# Patient Record
Sex: Female | Born: 1950
Health system: Southern US, Community
[De-identification: ages and names within clinical notes are randomized; demographics above are authoritative.]

## PROBLEM LIST (undated history)

## (undated) DIAGNOSIS — N135 Crossing vessel and stricture of ureter without hydronephrosis: Secondary | ICD-10-CM

## (undated) DIAGNOSIS — C801 Malignant (primary) neoplasm, unspecified: Secondary | ICD-10-CM

## (undated) DIAGNOSIS — N1330 Unspecified hydronephrosis: Secondary | ICD-10-CM

## (undated) DIAGNOSIS — R269 Unspecified abnormalities of gait and mobility: Secondary | ICD-10-CM

## (undated) DIAGNOSIS — N390 Urinary tract infection, site not specified: Secondary | ICD-10-CM

## (undated) DIAGNOSIS — M21372 Foot drop, left foot: Secondary | ICD-10-CM

## (undated) DIAGNOSIS — I10 Essential (primary) hypertension: Secondary | ICD-10-CM

## (undated) HISTORY — DX: Crossing vessel and stricture of ureter without hydronephrosis: N13.5

## (undated) HISTORY — DX: Urinary tract infection, site not specified: N39.0

## (undated) HISTORY — DX: Essential (primary) hypertension: I10

## (undated) HISTORY — DX: Unspecified hydronephrosis: N13.30

## (undated) HISTORY — DX: Unspecified abnormalities of gait and mobility: R26.9

## (undated) HISTORY — DX: Foot drop, left foot: M21.372

---

## 1983-03-04 HISTORY — PX: OTHER SURGICAL HISTORY: SHX169

## 2009-04-29 ENCOUNTER — Inpatient Hospital Stay: Payer: Self-pay | Admitting: Internal Medicine

## 2010-03-03 HISTORY — PX: OTHER SURGICAL HISTORY: SHX169

## 2011-01-09 ENCOUNTER — Ambulatory Visit: Payer: Self-pay | Admitting: Podiatry

## 2015-04-10 DIAGNOSIS — W57XXXA Bitten or stung by nonvenomous insect and other nonvenomous arthropods, initial encounter: Secondary | ICD-10-CM | POA: Diagnosis not present

## 2015-04-10 DIAGNOSIS — S30861A Insect bite (nonvenomous) of abdominal wall, initial encounter: Secondary | ICD-10-CM | POA: Diagnosis not present

## 2015-06-13 ENCOUNTER — Encounter: Payer: Self-pay | Admitting: *Deleted

## 2015-06-13 ENCOUNTER — Emergency Department
Admission: EM | Admit: 2015-06-13 | Discharge: 2015-06-13 | Disposition: A | Discharge status: Home / Self Care | Payer: PPO | Source: Home / Self Care | Attending: Emergency Medicine | Admitting: Emergency Medicine

## 2015-06-13 ENCOUNTER — Encounter: Payer: Self-pay | Admitting: Emergency Medicine

## 2015-06-13 ENCOUNTER — Emergency Department: Payer: PPO

## 2015-06-13 ENCOUNTER — Emergency Department
Admission: EM | Admit: 2015-06-13 | Discharge: 2015-06-13 | Disposition: A | Discharge status: Home / Self Care | Payer: PPO | Attending: Emergency Medicine | Admitting: Emergency Medicine

## 2015-06-13 DIAGNOSIS — I10 Essential (primary) hypertension: Secondary | ICD-10-CM | POA: Insufficient documentation

## 2015-06-13 DIAGNOSIS — N390 Urinary tract infection, site not specified: Secondary | ICD-10-CM

## 2015-06-13 DIAGNOSIS — Z87891 Personal history of nicotine dependence: Secondary | ICD-10-CM

## 2015-06-13 DIAGNOSIS — R300 Dysuria: Secondary | ICD-10-CM | POA: Diagnosis not present

## 2015-06-13 DIAGNOSIS — N1330 Unspecified hydronephrosis: Secondary | ICD-10-CM | POA: Diagnosis not present

## 2015-06-13 DIAGNOSIS — R319 Hematuria, unspecified: Secondary | ICD-10-CM | POA: Diagnosis not present

## 2015-06-13 DIAGNOSIS — R531 Weakness: Secondary | ICD-10-CM | POA: Diagnosis not present

## 2015-06-13 HISTORY — DX: Essential (primary) hypertension: I10

## 2015-06-13 LAB — URINALYSIS COMPLETE WITH MICROSCOPIC (ARMC ONLY)
Bilirubin Urine: NEGATIVE
GLUCOSE, UA: NEGATIVE mg/dL
NITRITE: POSITIVE — AB
Protein, ur: 30 mg/dL — AB
Specific Gravity, Urine: 1.021 (ref 1.005–1.030)
pH: 5 (ref 5.0–8.0)

## 2015-06-13 LAB — CBC
HCT: 39.6 % (ref 35.0–47.0)
Hemoglobin: 13.4 g/dL (ref 12.0–16.0)
MCH: 31.2 pg (ref 26.0–34.0)
MCHC: 33.9 g/dL (ref 32.0–36.0)
MCV: 92 fL (ref 80.0–100.0)
PLATELETS: 138 10*3/uL — AB (ref 150–440)
RBC: 4.3 MIL/uL (ref 3.80–5.20)
RDW: 14 % (ref 11.5–14.5)
WBC: 13.8 10*3/uL — AB (ref 3.6–11.0)

## 2015-06-13 LAB — COMPREHENSIVE METABOLIC PANEL
ALT: 27 U/L (ref 14–54)
AST: 27 U/L (ref 15–41)
Albumin: 4.2 g/dL (ref 3.5–5.0)
Alkaline Phosphatase: 78 U/L (ref 38–126)
Anion gap: 13 (ref 5–15)
BUN: 32 mg/dL — ABNORMAL HIGH (ref 6–20)
CHLORIDE: 98 mmol/L — AB (ref 101–111)
CO2: 23 mmol/L (ref 22–32)
CREATININE: 1.21 mg/dL — AB (ref 0.44–1.00)
Calcium: 9.3 mg/dL (ref 8.9–10.3)
GFR, EST AFRICAN AMERICAN: 52 mL/min — AB (ref >60)
GFR, EST NON AFRICAN AMERICAN: 45 mL/min — AB (ref >60)
Glucose, Bld: 154 mg/dL — ABNORMAL HIGH (ref 65–99)
POTASSIUM: 3.2 mmol/L — AB (ref 3.5–5.1)
SODIUM: 134 mmol/L — AB (ref 135–145)
Total Bilirubin: 1 mg/dL (ref 0.3–1.2)
Total Protein: 7.4 g/dL (ref 6.5–8.1)

## 2015-06-13 MED ORDER — CIPROFLOXACIN HCL 500 MG PO TABS: 500.0000 mg | ORAL_TABLET | Freq: Two times a day (BID) | ORAL | Status: DC

## 2015-06-13 MED ORDER — NITROFURANTOIN MACROCRYSTAL 100 MG PO CAPS: 100.0000 mg | ORAL_CAPSULE | Freq: Four times a day (QID) | ORAL | Status: DC

## 2015-06-13 MED ORDER — CIPROFLOXACIN HCL 500 MG PO TABS
500.0000 mg | ORAL_TABLET | Freq: Once | ORAL | Status: AC
  Administered 2015-06-13: 500 mg via ORAL
  Filled 2015-06-13: qty 1

## 2015-06-13 MED ORDER — PHENAZOPYRIDINE HCL 100 MG PO TABS: 100.0000 mg | ORAL_TABLET | Freq: Three times a day (TID) | ORAL | Status: DC | PRN

## 2015-06-13 MED ORDER — SODIUM CHLORIDE 0.9 % IV BOLUS (SEPSIS)
500.0000 mL | Freq: Once | INTRAVENOUS | Status: AC
  Administered 2015-06-13: 500 mL via INTRAVENOUS

## 2015-06-13 NOTE — ED Provider Notes (Signed)
[facility] Emergency Department Provider Note  ____________________________________________  Time seen:3:30AM  I have reviewed the triage vital signs and the nursing notes.   HISTORY  Chief Complaint Difficulty Walking and Urinary Frequency      HPI Abigail Hill is a 65 y.o. female presents with dysuria acute onset at 75 PM tonight. Patient husband states that he has noticed that his wife has had difficulty with ambulation.   Past Medical History  Diagnosis Date  . Hypertension     There are no active problems to display for this patient.   History reviewed. No pertinent past surgical history.  No current outpatient prescriptions on file.  Allergies Review of patient's allergies indicates no known allergies.  History reviewed. No pertinent family history.  Social History Social History  Substance Use Topics  . Smoking status: Former Research scientist (life sciences)  . Smokeless tobacco: None  . Alcohol Use: Yes    Review of Systems  Constitutional: Negative for fever. Eyes: Negative for visual changes. ENT: Negative for sore throat. Cardiovascular: Negative for chest pain. Respiratory: Negative for shortness of breath. Gastrointestinal: Negative for abdominal pain, vomiting and diarrhea. Genitourinary: Positive dysuria, frequency and urgency. Musculoskeletal: Negative for back pain. Skin: Negative for rash. Neurological: Negative for headaches, focal weakness or numbness.   10-point ROS otherwise negative.  ____________________________________________   PHYSICAL EXAM:  VITAL SIGNS: ED Triage Vitals  Enc Vitals Group     BP 06/13/15 0149 112/55 mmHg     Pulse Rate 06/13/15 0149 92     Resp 06/13/15 0149 18     Temp 06/13/15 0149 99 F (37.2 C)     Temp Source 06/13/15 0149 Oral     SpO2 06/13/15 0149 97 %     Weight 06/13/15 0149 155 lb (70.308 kg)     Height 06/13/15 0149 5\' 6"  (1.676 m)     Head Cir --      Peak Flow --      Pain Score  06/13/15 0215 0     Pain Loc --      Pain Edu? --      Excl. in [place]? --     Constitutional: Alert and oriented. Well appearing and in no distress. Eyes: Conjunctivae are normal. PERRL. Normal extraocular movements. ENT   Head: Normocephalic and atraumatic.   Nose: No congestion/rhinnorhea.   Mouth/Throat: Mucous membranes are moist.   Neck: No stridor. Hematological/Lymphatic/Immunilogical: No cervical lymphadenopathy. Cardiovascular: Normal rate, regular rhythm. Normal and symmetric distal pulses are present in all extremities. No murmurs, rubs, or gallops. Respiratory: Normal respiratory effort without tachypnea nor retractions. Breath sounds are clear and equal bilaterally. No wheezes/rales/rhonchi. Gastrointestinal: Soft and nontender. No distention. There is no CVA tenderness. Genitourinary: deferred Musculoskeletal: Nontender with normal range of motion in all extremities. No joint effusions.  No lower extremity tenderness nor edema. Neurologic:  Normal speech and language. No gross focal neurologic deficits are appreciated. Speech is normal.  Skin:  Skin is warm, dry and intact. No rash noted. Psychiatric: Mood and affect are normal. Speech and behavior are normal. Patient exhibits appropriate insight and judgment.  ____________________________________________    LABS (pertinent positives/negatives)  Labs Reviewed  URINALYSIS COMPLETEWITH MICROSCOPIC ([facility] ONLY) - Abnormal; Notable for the following:    Color, Urine YELLOW (*)    APPearance CLOUDY (*)    Ketones, ur TRACE (*)    Hgb urine dipstick 1+ (*)    Protein, ur 30 (*)    Nitrite POSITIVE (*)  Leukocytes, UA 3+ (*)    Bacteria, UA MANY (*)    Squamous Epithelial / LPF 6-30 (*)    All other components within normal limits  URINE CULTURE       INITIAL IMPRESSION / ASSESSMENT AND PLAN / ED COURSE  Pertinent labs & imaging results that were available during my care of the patient were reviewed  by me and considered in my medical decision making (see chart for details).  Cipro 500 mg po given Patient will be prescribed the same and Pyridium for home.  ____________________________________________   FINAL CLINICAL IMPRESSION(S) / ED DIAGNOSES  Final diagnoses:  UTI (lower urinary tract infection)      Gregor Hams, MD 06/13/15 [phone]

## 2015-06-13 NOTE — ED Notes (Signed)
Pt unable to urinate at this time, given specimen cup for when is able to void.  

## 2015-06-13 NOTE — Discharge Instructions (Signed)

## 2015-06-13 NOTE — ED Provider Notes (Signed)
Time Seen: Approximately 1720  I have reviewed the triage notes  Chief Complaint: Urinary Frequency   History of Present Illness: Abigail Hill is a 65 y.o. female who is here the previous evening and was diagnosed with a urinary tract infection. Patient has urine culture results which are still pending. The patient was discharged with a prescription for Cipro. She states since being home that she's continued to have dysuria, nausea, vomited 1. She went to a follow-up appointment at the local clinic and was referred here for further emergency Department evaluation. She still describes some generalized weakness in both lower extremities. She denies any upper extremity weakness. She denies any headaches. Fever at home. She denies any trouble with speech or swallowing. The patient states that she received a shot of antibiotics at the clinic which one investigation turns out to be Rocephin. She states her lower extremity weakness has improved at this point. She is able to ambulate, etc. She describes some shaking on the way to the clinic for appointment. She did have a low-grade fever last night. She denies any chest or abdominal pain. Past Medical History  Diagnosis Date  . Hypertension     There are no active problems to display for this patient.   History reviewed. No pertinent past surgical history.  History reviewed. No pertinent past surgical history.  Current Outpatient Rx  Name  Route  Sig  Dispense  Refill  . ciprofloxacin (CIPRO) 500 MG tablet   Oral   Take 1 tablet (500 mg total) by mouth 2 (two) times daily.   14 tablet   0   . phenazopyridine (PYRIDIUM) 100 MG tablet   Oral   Take 1 tablet (100 mg total) by mouth 3 (three) times daily as needed for pain.   20 tablet   0     Allergies:  Review of patient's allergies indicates no known allergies.  Family History: History reviewed. No pertinent family history.  Social History: Social History  Substance Use Topics   . Smoking status: Former Research scientist (life sciences)  . Smokeless tobacco: None  . Alcohol Use: Yes     Review of Systems:   10 point review of systems was performed and was otherwise negative:  Constitutional: No fever Eyes: No visual disturbances ENT: No sore throat, ear pain Cardiac: No chest pain Respiratory: No shortness of breath, wheezing, or stridor Abdomen: No abdominal pain, no vomiting, No diarrhea Endocrine: No weight loss, No night sweats Extremities: No peripheral edema, cyanosis Skin: No rashes, easy bruising Neurologic: No focal weakness, trouble with speech or swollowing Urologic: Burning with urination, no Hematuria, or urinary frequency   Physical Exam:  ED Triage Vitals  Enc Vitals Group     BP 06/13/15 1650 145/72 mmHg     Pulse Rate 06/13/15 1650 102     Resp 06/13/15 1650 18     Temp 06/13/15 1650 98.6 F (37 C)     Temp Source 06/13/15 1650 Oral     SpO2 06/13/15 1650 92 %     Weight 06/13/15 1650 155 lb (70.308 kg)     Height 06/13/15 1650 5\' 6"  (1.676 m)     Head Cir --      Peak Flow --      Pain Score 06/13/15 1651 0     Pain Loc --      Pain Edu? --      Excl. in [place]? --     General: Awake , Alert , and Oriented  times 3; GCS 15 Head: Normal cephalic , atraumatic Eyes: Pupils equal , round, reactive to light Nose/Throat: No nasal drainage, patent upper airway without erythema or exudate.  Neck: Supple, Full range of motion, No anterior adenopathy or palpable thyroid masses Lungs: Clear to ascultation without wheezes , rhonchi, or rales Heart: Regular rate, regular rhythm without murmurs , gallops , or rubs Abdomen: Soft, non tender without rebound, guarding , or rigidity; bowel sounds positive and symmetric in all 4 quadrants. No organomegaly .        Extremities: 2 plus symmetric pulses. No edema, clubbing or cyanosis Neurologic: normal ambulation, Motor symmetric without deficits, sensory intact Skin: warm, dry, no rashes   Labs:   All laboratory  work was reviewed including any pertinent negatives or positives listed below:  Labs Reviewed  CBC - Abnormal; Notable for the following:    WBC 13.8 (*)    Platelets 138 (*)    All other components within normal limits  COMPREHENSIVE METABOLIC PANEL  Patient's white blood cell count is elevated but otherwise no abnormalities, renal function is normal   Radiology:  Narrative:    CLINICAL DATA: Difficulty walking, urinary frequency, difficulty moving LEFT leg, hematuria, hypertension, former smoker  EXAM: CT ABDOMEN AND PELVIS WITHOUT CONTRAST  TECHNIQUE: Multidetector CT imaging of the abdomen and pelvis was performed following the standard protocol without IV contrast. Sagittal and coronal MPR images reconstructed from axial data set. Oral contrast was not administered.  COMPARISON: None  FINDINGS: Lung bases appear hyperinflated but clear.  Marked LEFT hydronephrosis without ureteral dilatation or calcification consistent with UPJ obstruction.  Within limits of a nonenhanced exam, no additional focal abnormalities of the liver, spleen, pancreas, kidneys, or adrenal glands.  Normal appendix lateral the cecum.  Scattered colonic diverticulosis greatest at sigmoid colon without evidence of diverticulitis.  Stomach and bowel loops otherwise normal appearance.  Bladder, ureters, uterus and adnexa unremarkable.  No mass, adenopathy, free air or free fluid.  Diffuse osseous demineralization with grade 1 spondylolisthesis L5-S1 secondary to BILATERAL spondylolysis L5.  IMPRESSION: Marked LEFT hydronephrosis without ureteral dilatation or urinary tract calcification compatible with UPJ obstruction ; this could be due to stricture, tumor, radiolucent stone or edema due to prior stone passage.  Colonic diverticulosis without evidence of diverticulitis.          I personally reviewed the radiologic studies   P ED Course:  Given the patient's current  clinical presentation and objective findings appears that she does have exclusively a urinary tract infection with some possible left-sided hydronephrosis. There is no obvious evidence of renal colic at this time. Her hematuria is most likely the infectious source. Patient had received IM Rocephin prior to arrival and was the appropriate antibiotic of selection. Her urine culture has not returned and it sounds as though she may have had difficulty with the ciprofloxacin as she had some nausea afterwards. A current nausea or vomiting. Patient's temperature seemed to resolve and I felt the shaking may have been some mild rigors. Patient will be discharged on Macrodantin and was advised that her urine culture is still pending. I felt was unlikely that she was septic at this time and she is otherwise hemodynamically stable and wishes to be discharged home.    Assessment: * Urinary tract infection versus pyelonephritis      Plan: * Outpatient Patient was advised to return immediately if condition worsens. Patient was advised to follow up with their primary care physician or other specialized physicians involved  in their outpatient care. The patient and/or family member/power of attorney had laboratory results reviewed at the bedside. All questions and concerns were addressed and appropriate discharge instructions were distributed by the nursing staff.             Daymon Larsen, MD 06/13/15 [phone]

## 2015-06-13 NOTE — ED Notes (Signed)
Discharge instructions reviewed with patient. Patient verbalized understanding. Patient ambulated to lobby without difficulty with minimal assistance from her husband.

## 2015-06-13 NOTE — ED Notes (Signed)
Pt was seen in Er last night and diagnosed with a UTI and given cipro, states she was at her PCP office today given a abx shot and sent to the ER, family is unable to state why they were sent to the Er except she was unsteady on her feet, pt awake and alert in no acute distress

## 2015-06-13 NOTE — ED Notes (Signed)
Pt arrived to the ED accompanied by her husband for difficulty walking and urinary frequency. Pt reports that she just came back from vacation and she began to experience difficulty moving her left leg after being walking for a long time; no other neurological symptoms reported. Pt also reports urinary frequency. Pt is AOx4 in no apparent distress.

## 2015-06-15 LAB — URINE CULTURE

## 2015-06-25 DIAGNOSIS — E876 Hypokalemia: Secondary | ICD-10-CM | POA: Diagnosis not present

## 2015-06-25 DIAGNOSIS — N3000 Acute cystitis without hematuria: Secondary | ICD-10-CM | POA: Diagnosis not present

## 2015-06-25 DIAGNOSIS — N1330 Unspecified hydronephrosis: Secondary | ICD-10-CM | POA: Diagnosis not present

## 2015-06-25 DIAGNOSIS — Z862 Personal history of diseases of the blood and blood-forming organs and certain disorders involving the immune mechanism: Secondary | ICD-10-CM | POA: Diagnosis not present

## 2015-07-10 ENCOUNTER — Ambulatory Visit (INDEPENDENT_AMBULATORY_CARE_PROVIDER_SITE_OTHER): Payer: PPO | Admitting: Urology

## 2015-07-10 ENCOUNTER — Encounter: Payer: Self-pay | Admitting: Urology

## 2015-07-10 VITALS — BP 158/73 | HR 83 | Ht 64.0 in | Wt 156.2 lb

## 2015-07-10 DIAGNOSIS — N390 Urinary tract infection, site not specified: Secondary | ICD-10-CM

## 2015-07-10 DIAGNOSIS — N1330 Unspecified hydronephrosis: Secondary | ICD-10-CM | POA: Diagnosis not present

## 2015-07-10 LAB — URINALYSIS, COMPLETE
Bilirubin, UA: NEGATIVE
Glucose, UA: NEGATIVE
Ketones, UA: NEGATIVE
Leukocytes, UA: NEGATIVE
Nitrite, UA: NEGATIVE
PROTEIN UA: NEGATIVE
RBC, UA: NEGATIVE
Specific Gravity, UA: 1.015 (ref 1.005–1.030)
UUROB: 0.2 mg/dL (ref 0.2–1.0)
pH, UA: 6.5 (ref 5.0–7.5)

## 2015-07-10 LAB — MICROSCOPIC EXAMINATION: Epithelial Cells (non renal): NONE SEEN /hpf (ref 0–10)

## 2015-07-10 NOTE — Progress Notes (Signed)
07/10/2015 2:06 PM   [name] Apr 30, 1950 [id number]  Referring provider: Madelyn Brunner, MD [place] [facility] [place], [place] [address]  Chief Complaint  Patient presents with  . Recurrent UTI    referred by Dr. Lisette Grinder  . Hydronephrosis    left kidney not draining    HPI: Patient is a 65 year old Caucasian female who is referred by her primary care physician, Dr. Lisette Grinder, after undergoing a CT Renal stone scan during a trip to the emergency room and was found to have marketed left hydronephrosis.  Patient was initially seen at [facility] emergency department on 06/13/2015 for difficulty walking and urinary frequency.  Her UA at that time was suspicious for infection with TNTC RBC's/hpf and TNTC WBC's/hpf and nitrite positive.  She was given Rocephin and discharged to home with Cipro 500 mg and Pyridium.  She continued to worsen and sought treatment at an urgent care facility. She was then rerouted back to the emergency room.  By this time, she developed nausea and vomiting.  Her ambulation had improved.  A CT renal stone study was then performed and noted marked LEFT hydronephrosis without ureteral dilatation or urinary tract calcification compatible with UPJ obstruction ; this could be due to stricture, tumor, radiolucent stone or edema due to prior stone passage.  She was then changed to [facility] and discharged to home.  Her urine culture was positive for Escherichia coli. It was sensitive to all the antibiotics that she was given.  Today, she states she is feeling well.  She is not having flank pain, dysuria, gross hematuria or suprapubic pain.  She is not having fevers, chills, nausea or vomiting.  Her UA is unremarkable.  Her baseline urinary symptoms consist of urinary frequency and nocturia.  She did undergo a surgical procedure in 1985 with Dr. Redmond Baseman.  At her description, it sounds like she underwent a pyeloplasty.  I did personally review  the CT films with the patient and with Dr. Erlene Quan.  Is not having history of recurrent urinary tract infections or nephrolithiasis.  PMH: Past Medical History  Diagnosis Date  . Hypertension   . Recurrent UTI     Surgical History: Past Surgical History  Procedure Laterality Date  . Broken foot  2012  . Kidney repair  1985    repaired torn blood vessel in kidney    Home Medications:    Medication List       This list is accurate as of: 07/10/15  2:06 PM.  Always use your most recent med list.               CENTRUM SILVER PO  Take by mouth.     ciprofloxacin 500 MG tablet  Commonly known as:  CIPRO  Reported on 07/10/2015     doxycycline 100 MG tablet  Commonly known as:  VIBRA-TABS  Reported on 07/10/2015     hydrochlorothiazide 12.5 MG tablet  Commonly known as:  HYDRODIURIL  Take by mouth.     levofloxacin 500 MG tablet  Commonly known as:  LEVAQUIN  Reported on 07/10/2015     mupirocin ointment 2 %  Commonly known as:  BACTROBAN  Reported on 07/10/2015     nitrofurantoin 100 MG capsule  Commonly known as:  MACRODANTIN  Take 1 capsule (100 mg total) by mouth 4 (four) times daily.     phenazopyridine 100 MG tablet  Commonly known as:  PYRIDIUM  Take 1 tablet (  100 mg total) by mouth 3 (three) times daily as needed for pain.        Allergies: No Known Allergies  Family History: Family History  Problem Relation Age of Onset  . Kidney disease Neg Hx   . Bladder Cancer Neg Hx     Social History:  reports that she has quit smoking. She does not have any smokeless tobacco history on file. She reports that she drinks alcohol. She reports that she does not use illicit drugs.  ROS: UROLOGY Frequent Urination?: Yes Hard to postpone urination?: No Burning/pain with urination?: No Get up at night to urinate?: Yes Leakage of urine?: No Urine stream starts and stops?: No Trouble starting stream?: No Do you have to strain to urinate?: No Blood in urine?:  No Urinary tract infection?: Yes Sexually transmitted disease?: No Injury to kidneys or bladder?: No Painful intercourse?: No Weak stream?: No Currently pregnant?: No Vaginal bleeding?: No Last menstrual period?: n  Gastrointestinal Nausea?: No Vomiting?: No Indigestion/heartburn?: No Diarrhea?: No Constipation?: No  Constitutional Fever: No Night sweats?: No Weight loss?: No Fatigue?: No  Skin Skin rash/lesions?: No Itching?: No  Eyes Blurred vision?: No Double vision?: No  Ears/Nose/Throat Sore throat?: No Sinus problems?: No  Hematologic/Lymphatic Swollen glands?: No Easy bruising?: No  Cardiovascular Leg swelling?: No Chest pain?: No  Respiratory Cough?: No Shortness of breath?: No  Endocrine Excessive thirst?: No  Musculoskeletal Back pain?: No Joint pain?: No  Neurological Headaches?: No Dizziness?: No  Psychologic Depression?: No Anxiety?: No  Physical Exam: BP 158/73 mmHg  Pulse 83  Ht 5\' 4"  (1.626 m)  Wt 156 lb 3.2 oz (70.852 kg)  BMI 26.80 kg/m2  Constitutional: Well nourished. Alert and oriented, No acute distress. HEENT: [place] AT, moist mucus membranes. Trachea midline, no masses. Cardiovascular: No clubbing, cyanosis, or edema. Respiratory: Normal respiratory effort, no increased work of breathing. GI: Abdomen is soft, non tender, non distended, no abdominal masses. Liver and spleen not palpable.  No hernias appreciated.  Stool sample for occult testing is not indicated.   GU: No CVA tenderness.  No bladder fullness or masses.   Skin: No rashes, bruises or suspicious lesions. Lymph: No cervical or inguinal adenopathy. Neurologic: Grossly intact, no focal deficits, moving all 4 extremities. Psychiatric: Normal mood and affect.  Laboratory Data: Lab Results  Component Value Date   WBC 13.8* 06/13/2015   HGB 13.4 06/13/2015   HCT 39.6 06/13/2015   MCV 92.0 06/13/2015   PLT 138* 06/13/2015   Serum creatinine  0.9    07/13/2013  0.8   08/01/2014  0.8   01/31/2015  Lab Results  Component Value Date   CREATININE 1.21* 06/13/2015  Serum creatinine  1.0   06/25/2015   Lab Results  Component Value Date   AST 27 06/13/2015   Lab Results  Component Value Date   ALT 27 06/13/2015    Urinalysis Results for orders placed or performed in visit on 07/10/15  CULTURE, URINE COMPREHENSIVE  Result Value Ref Range   Urine Culture, Comprehensive Final report    Result 1 Comment   Microscopic Examination  Result Value Ref Range   WBC, UA 0-5 0 -  5 /hpf   RBC, UA 0-2 0 -  2 /hpf   Epithelial Cells (non renal) None seen 0 - 10 /hpf   Bacteria, UA Few (A) None seen/Few  Urinalysis, Complete  Result Value Ref Range   Specific Gravity, UA 1.015 1.005 - 1.030   pH, UA  6.5 5.0 - 7.5   Color, UA Yellow Yellow   Appearance Ur Clear Clear   Leukocytes, UA Negative Negative   Protein, UA Negative Negative/Trace   Glucose, UA Negative Negative   Ketones, UA Negative Negative   RBC, UA Negative Negative   Bilirubin, UA Negative Negative   Urobilinogen, Ur 0.2 0.2 - 1.0 mg/dL   Nitrite, UA Negative Negative   Microscopic Examination See below:   BUN+Creat  Result Value Ref Range   BUN 17 8 - 27 mg/dL   Creatinine, Ser 0.99 0.57 - 1.00 mg/dL   GFR calc non Af Amer 59 (L) >59 mL/min/1.73   GFR calc Af Amer 68 >59 mL/min/1.73   BUN/Creatinine Ratio 17 12 - 28    Pertinent Imaging: CLINICAL DATA: Difficulty walking, urinary frequency, difficulty moving LEFT leg, hematuria, hypertension, former smoker  EXAM: CT ABDOMEN AND PELVIS WITHOUT CONTRAST  TECHNIQUE: Multidetector CT imaging of the abdomen and pelvis was performed following the standard protocol without IV contrast. Sagittal and coronal MPR images reconstructed from axial data set. Oral contrast was not administered.  COMPARISON: None  FINDINGS: Lung bases appear hyperinflated but clear.  Marked LEFT hydronephrosis without  ureteral dilatation or calcification consistent with UPJ obstruction.  Within limits of a nonenhanced exam, no additional focal abnormalities of the liver, spleen, pancreas, kidneys, or adrenal glands.  Normal appendix lateral the cecum.  Scattered colonic diverticulosis greatest at sigmoid colon without evidence of diverticulitis.  Stomach and bowel loops otherwise normal appearance.  Bladder, ureters, uterus and adnexa unremarkable.  No mass, adenopathy, free air or free fluid.  Diffuse osseous demineralization with grade 1 spondylolisthesis L5-S1 secondary to BILATERAL spondylolysis L5.  IMPRESSION: Marked LEFT hydronephrosis without ureteral dilatation or urinary tract calcification compatible with UPJ obstruction ; this could be due to stricture, tumor, radiolucent stone or edema due to prior stone passage.  Colonic diverticulosis without evidence of diverticulitis.   Electronically Signed  By: Lavonia Dana M.D.  On: 06/13/2015 18:26   Assessment & Plan:    1. Left hydronephrosis:   Patient found to have left hydronephrosis compatible with a UPJ obstruction on CT renal stone study completed on 06/13/2015.  She does have a prior history of kidney surgery, most likely a pyeloplasty.  We'll obtain a renal Lasix scan at this time to establish a baseline of kidney function.  She will return to discuss the results.  - BUN+Creat  2. UTI:   Patient had a recent infection with Escherichia coli.  Symptoms have now resolved.  UA is unremarkable.  We will culture the urine to ensure clearance of the infection.  Patient will contact our office if she should develop symptoms of urinary tract infection so that we may do a UA and send the urine for culture if appropriate.  - Urinalysis, Complete - CULTURE, URINE COMPREHENSIVE  Return for Renal lasix scan report with me or Dr. Erlene Quan.  These notes generated with voice recognition software. I apologize for  typographical errors.  Zara Council, [facility] [address] [place], [place] [address] [phone]

## 2015-07-11 LAB — BUN+CREAT
BUN/Creatinine Ratio: 17 (ref 12–28)
BUN: 17 mg/dL (ref 8–27)
CREATININE: 0.99 mg/dL (ref 0.57–1.00)
GFR, EST AFRICAN AMERICAN: 68 mL/min/{1.73_m2} (ref >59)
GFR, EST NON AFRICAN AMERICAN: 59 mL/min/{1.73_m2} — AB (ref >59)

## 2015-07-12 LAB — CULTURE, URINE COMPREHENSIVE

## 2015-07-14 DIAGNOSIS — N390 Urinary tract infection, site not specified: Secondary | ICD-10-CM

## 2015-07-14 DIAGNOSIS — N1330 Unspecified hydronephrosis: Secondary | ICD-10-CM

## 2015-07-14 HISTORY — DX: Urinary tract infection, site not specified: N39.0

## 2015-07-14 HISTORY — DX: Unspecified hydronephrosis: N13.30

## 2015-07-16 ENCOUNTER — Telehealth: Payer: Self-pay | Admitting: Urology

## 2015-07-16 NOTE — Telephone Encounter (Signed)
The nuclear medicine radiology department called and asked if you would please change the order for this patient to "WITH PHARM".  They typically do not do "WITH AND WITHOUT PHARM".  Please change the order so they can schedule.

## 2015-07-16 NOTE — Telephone Encounter (Signed)
Would they double check?  I wanted the same study "renal lasix scan" on another patient and that is what they told me to order.

## 2015-07-17 NOTE — Telephone Encounter (Signed)
Spoke with Jody, at nuclear med, who stated that the order in the system is the correct order.

## 2015-07-23 ENCOUNTER — Ambulatory Visit
Admission: RE | Admit: 2015-07-23 | Discharge: 2015-07-23 | Disposition: A | Discharge status: Home / Self Care | Payer: PPO | Source: Ambulatory Visit | Attending: Urology | Admitting: Urology

## 2015-07-23 DIAGNOSIS — N1330 Unspecified hydronephrosis: Secondary | ICD-10-CM | POA: Diagnosis not present

## 2015-07-23 MED ORDER — FUROSEMIDE 10 MG/ML IJ SOLN
20.0000 mg | Freq: Once | INTRAMUSCULAR | Status: AC
  Administered 2015-07-23: 20 mg via INTRAVENOUS
  Filled 2015-07-23: qty 2

## 2015-07-23 MED ORDER — TECHNETIUM TC 99M MERTIATIDE
5.0000 | Freq: Once | INTRAVENOUS | Status: AC | PRN
  Administered 2015-07-23: 5.42 via INTRAVENOUS

## 2015-07-24 ENCOUNTER — Ambulatory Visit (INDEPENDENT_AMBULATORY_CARE_PROVIDER_SITE_OTHER): Payer: PPO | Admitting: Urology

## 2015-07-24 ENCOUNTER — Encounter: Payer: Self-pay | Admitting: Urology

## 2015-07-24 VITALS — BP 134/74 | HR 82 | Ht 64.0 in | Wt 156.3 lb

## 2015-07-24 DIAGNOSIS — N1330 Unspecified hydronephrosis: Secondary | ICD-10-CM | POA: Diagnosis not present

## 2015-07-24 DIAGNOSIS — N135 Crossing vessel and stricture of ureter without hydronephrosis: Secondary | ICD-10-CM

## 2015-07-24 NOTE — Progress Notes (Signed)
6:50 PM   LEWIS GLERUM 10/09/1950 [id number]  Referring provider: Madelyn Brunner, MD [facility] [place], [place] [address]  Chief Complaint  Patient presents with  . Results    Renal Lasix Report    HPI: Patient is a 65 year old Caucasian female who presents today to discuss her renal Lasix report.  Background history Patient was referred by her primary care physician, Dr. Lisette Grinder, after undergoing a CT Renal stone scan during a trip to the emergency room and was found to have marketed left hydronephrosis.   Patient was initially seen at [facility] emergency department on 06/13/2015 for difficulty walking and urinary frequency.  Her UA at that time was suspicious for infection with TNTC RBC's/hpf and TNTC WBC's/hpf and nitrite positive.  She was given Rocephin and discharged to home with Cipro 500 mg and Pyridium.  She continued to worsen and sought treatment at an urgent care facility. She was then rerouted back to the emergency room.  By this time, she developed nausea and vomiting.  Her ambulation had improved.  A CT renal stone study was then performed and noted marked LEFT hydronephrosis without ureteral dilatation or urinary tract calcification compatible with UPJ obstruction ; this could be due to stricture, tumor, radiolucent stone or edema due to prior stone passage.  She was then changed to [facility] and discharged to home.  Her urine culture was positive for Escherichia coli. It was sensitive to all the antibiotics that she was given.    Today, she states she is feeling well.  She is not having flank pain, dysuria, gross hematuria or suprapubic pain.  She is not having fevers, chills, nausea or vomiting.  Her baseline urinary symptoms consist of urinary frequency, incontinence and nocturia.  She did undergo a surgical procedure in 1985 with Dr. Redmond Baseman.  With her description, it sounds like she underwent a pyeloplasty.  She does not have a history of  recurrent urinary tract infections or nephrolithiasis.  Renal Lasix scan completed on 07/23/2015 noted normally functioning right kidney.  Delayed perfusion, cortical uptake and excretion of the  radiopharmaceutical by the hydronephrotic left kidney compatible with UPJ obstruction.  Split renal function is equal to 62.5% in the right kidney and 37.5% from the left kidney.  I personally reviewed the films with the patient and with Dr. Erlene Quan.   PMH: Past Medical History  Diagnosis Date  . Hypertension   . Recurrent UTI     Surgical History: Past Surgical History  Procedure Laterality Date  . Broken foot  2012  . Kidney repair  1985    repaired torn blood vessel in kidney    Home Medications:    Medication List       This list is accurate as of: 07/24/15 11:59 PM.  Always use your most recent med list.               CENTRUM SILVER PO  Take by mouth.     ciprofloxacin 500 MG tablet  Commonly known as:  CIPRO  Reported on 07/24/2015     doxycycline 100 MG tablet  Commonly known as:  VIBRA-TABS  Reported on 07/24/2015     hydrochlorothiazide 12.5 MG tablet  Commonly known as:  HYDRODIURIL  Take by mouth.     levofloxacin 500 MG tablet  Commonly known as:  LEVAQUIN  Reported on 07/24/2015     mupirocin ointment 2 %  Commonly known as:  [employer]  Reported on 07/24/2015     nitrofurantoin 100 MG capsule  Commonly known as:  MACRODANTIN  Take 1 capsule (100 mg total) by mouth 4 (four) times daily.     phenazopyridine 100 MG tablet  Commonly known as:  PYRIDIUM  Take 1 tablet (100 mg total) by mouth 3 (three) times daily as needed for pain.        Allergies: No Known Allergies  Family History: Family History  Problem Relation Age of Onset  . Kidney disease Neg Hx   . Bladder Cancer Neg Hx     Social History:  reports that she has quit smoking. She does not have any smokeless tobacco history on file. She reports that she drinks alcohol. She reports that  she does not use illicit drugs.  ROS: UROLOGY Frequent Urination?: Yes Hard to postpone urination?: No Burning/pain with urination?: No Get up at night to urinate?: Yes Leakage of urine?: Yes Urine stream starts and stops?: No Trouble starting stream?: No Do you have to strain to urinate?: No Blood in urine?: No Urinary tract infection?: No Sexually transmitted disease?: No Injury to kidneys or bladder?: No Painful intercourse?: No Weak stream?: No Currently pregnant?: No Vaginal bleeding?: No Last menstrual period?: n  Gastrointestinal Nausea?: No Vomiting?: No Indigestion/heartburn?: No Diarrhea?: No Constipation?: No  Constitutional Fever: No Night sweats?: No Weight loss?: No Fatigue?: No  Skin Skin rash/lesions?: No Itching?: No  Eyes Blurred vision?: No Double vision?: No  Ears/Nose/Throat Sore throat?: No Sinus problems?: No  Hematologic/Lymphatic Swollen glands?: No Easy bruising?: No  Cardiovascular Leg swelling?: No Chest pain?: No  Respiratory Cough?: No Shortness of breath?: No  Endocrine Excessive thirst?: No  Musculoskeletal Back pain?: No Joint pain?: No  Neurological Headaches?: No Dizziness?: No  Psychologic Depression?: No Anxiety?: No  Physical Exam: BP 134/74 mmHg  Pulse 82  Ht 5\' 4"  (1.626 m)  Wt 156 lb 4.8 oz (70.897 kg)  BMI 26.82 kg/m2  Constitutional: Well nourished. Alert and oriented, No acute distress. HEENT:  AT, moist mucus membranes. Trachea midline, no masses. Cardiovascular: No clubbing, cyanosis, or edema. Respiratory: Normal respiratory effort, no increased work of breathing. GI: Abdomen is soft, non tender, non distended, no abdominal masses. Liver and spleen not palpable.  No hernias appreciated.  Stool sample for occult testing is not indicated.   GU: No CVA tenderness.  No bladder fullness or masses.   Skin: No rashes, bruises or suspicious lesions. Lymph: No cervical or inguinal  adenopathy. Neurologic: Grossly intact, no focal deficits, moving all 4 extremities. Psychiatric: Normal mood and affect.  Laboratory Data: Lab Results  Component Value Date   WBC 13.8* 06/13/2015   HGB 13.4 06/13/2015   HCT 39.6 06/13/2015   MCV 92.0 06/13/2015   PLT 138* 06/13/2015   Serum creatinine  0.9   07/13/2013  0.8   08/01/2014  0.8   01/31/2015  Lab Results  Component Value Date   CREATININE 0.99 07/10/2015  Serum creatinine  1.0   06/25/2015   Lab Results  Component Value Date   AST 27 06/13/2015   Lab Results  Component Value Date   ALT 27 06/13/2015      Pertinent Imaging: CLINICAL DATA: Evaluate the left hydronephrosis.  EXAM: NUCLEAR MEDICINE RENAL SCAN WITH DIURETIC ADMINISTRATION  TECHNIQUE: Radionuclide angiographic and sequential renal images were obtained after intravenous injection of radiopharmaceutical. Imaging was continued during slow intravenous injection of Lasix approximately 15 minutes after the start of the examination.  RADIOPHARMACEUTICALS: 5.4  mCi Technetium-27m MAG3 IV  COMPARISON: 06/13/2015  FINDINGS: Flow: There is normal perfusion to the right kidney. Delayed and diminished perfusion to the left kidney noted.  Left renogram: Delayed cortical uptake noted. Delayed excretion into a copious, hydronephrotic left renal collecting system noted. Markedly diminished clearance following Lasix.  Right renogram: Normal cortical uptake, excretion in clearance of the radiopharmaceutical.  Differential:  Left kidney = 37.5 %  Right kidney = 62.5 %  IMPRESSION: 1. Normally functioning right kidney. 2. Delayed perfusion, cortical uptake and excretion of the radiopharmaceutical by the hydronephrotic left kidney compatible with UPJ obstruction. 3. Split renal function is equal to 62.5% in the right kidney and 37.5% from the left kidney.   Electronically Signed  By: Kerby Moors M.D.  On:  07/23/2015 14:45   Assessment & Plan:    1. Left hydronephrosis:   Patient found to have left hydronephrosis compatible with a UPJ obstruction on renal lasix scan completed on 07/23/2015.   She does have a prior history of kidney surgery, most likely a pyeloplasty.  She will return to discuss the results further with Dr. Erlene Quan as she may want to pursue a reconstruction of the UPJ.    2. UPJ obstruction:   See above.    3. UTI:  Resolved.   Return for appointment with Dr. Erlene Quan.  These notes generated with voice recognition software. I apologize for typographical errors.  Zara Council, [facility] [address] [place], [place] [address] [phone]

## 2015-07-28 DIAGNOSIS — N135 Crossing vessel and stricture of ureter without hydronephrosis: Secondary | ICD-10-CM | POA: Insufficient documentation

## 2015-07-28 DIAGNOSIS — N1330 Unspecified hydronephrosis: Secondary | ICD-10-CM | POA: Insufficient documentation

## 2015-07-28 HISTORY — DX: Crossing vessel and stricture of ureter without hydronephrosis: N13.5

## 2015-07-31 DIAGNOSIS — E876 Hypokalemia: Secondary | ICD-10-CM | POA: Diagnosis not present

## 2015-07-31 DIAGNOSIS — I10 Essential (primary) hypertension: Secondary | ICD-10-CM | POA: Diagnosis not present

## 2015-07-31 DIAGNOSIS — N3000 Acute cystitis without hematuria: Secondary | ICD-10-CM | POA: Diagnosis not present

## 2015-07-31 DIAGNOSIS — Z862 Personal history of diseases of the blood and blood-forming organs and certain disorders involving the immune mechanism: Secondary | ICD-10-CM | POA: Diagnosis not present

## 2015-08-24 ENCOUNTER — Ambulatory Visit (INDEPENDENT_AMBULATORY_CARE_PROVIDER_SITE_OTHER): Payer: PPO | Admitting: Urology

## 2015-08-24 VITALS — Ht 63.0 in | Wt 154.0 lb

## 2015-08-24 DIAGNOSIS — R29898 Other symptoms and signs involving the musculoskeletal system: Secondary | ICD-10-CM | POA: Diagnosis not present

## 2015-08-24 DIAGNOSIS — I10 Essential (primary) hypertension: Secondary | ICD-10-CM | POA: Insufficient documentation

## 2015-08-24 DIAGNOSIS — N1330 Unspecified hydronephrosis: Secondary | ICD-10-CM

## 2015-08-24 DIAGNOSIS — N135 Crossing vessel and stricture of ureter without hydronephrosis: Secondary | ICD-10-CM | POA: Diagnosis not present

## 2015-08-24 HISTORY — DX: Essential (primary) hypertension: I10

## 2015-08-24 NOTE — Progress Notes (Signed)
08/24/2015 2:49 PM   Abigail Hill Feb 05, 1951 [id number]  Referring provider: Madelyn Brunner, MD [facility] [place], [place] [address]  Chief Complaint  Patient presents with  . Hydronephrosis    follow up    HPI: 65 year old female who presents today to discuss management of her left UPJ obstruction.  She presented to the emergency room on 06/13/2015 with a severe urinary tract infection, E. coli. As part of further workup, she underwent a CT stone protocol which revealed marked left hydronephrosis compatible with UPJ obstruction without an obstructing stone.  She was seen and evaluated in our office and underwent a Lasix renogram on 07/23/2015 which was consistent with left UPJ obstruction with decreased split function, 37.5% of of the left kidney.  She did have surgery on her left kidney 30 years ago by Dr. Redmond Baseman at which time it sounds like she had a UPJ repair (open pyeloplasty).  She has a flank incision on the left.    Since surgery, she's had no issues with flank pain, hematuria, stones or any other urinary issues. This is her first urinary tract infection in 10 years.  Cr 0.99, normal.  She does report today that over the past year, she has had increasing issues lifting her left thigh. It is fairly weak at times from an unclear etiology. She has no other leg weakness. She denies any foot drop or paresthesia. She has no issues with flexion or extension of her calf. She denies any problems with by abduction or abduction.  At times, it makes it difficult to it up out of her chair.    PMH: Past Medical History  Diagnosis Date  . Hypertension   . Recurrent UTI     Surgical History: Past Surgical History  Procedure Laterality Date  . Broken foot  2012  . Kidney repair  1985    repaired torn blood vessel in kidney    Home Medications:    Medication List       This list is accurate as of: 08/24/15 11:59 PM.  Always use your most  recent med list.               CENTRUM SILVER PO  Take by mouth.     hydrochlorothiazide 12.5 MG tablet  Commonly known as:  HYDRODIURIL  Take by mouth.        Allergies: No Known Allergies  Family History: Family History  Problem Relation Age of Onset  . Kidney disease Neg Hx   . Bladder Cancer Neg Hx     Social History:  reports that she has quit smoking. She does not have any smokeless tobacco history on file. She reports that she drinks alcohol. She reports that she does not use illicit drugs.  ROS: UROLOGY Frequent Urination?: No Hard to postpone urination?: No Burning/pain with urination?: No Get up at night to urinate?: Yes Leakage of urine?: No Urine stream starts and stops?: No Trouble starting stream?: No Do you have to strain to urinate?: No Blood in urine?: No Urinary tract infection?: No Sexually transmitted disease?: No Injury to kidneys or bladder?: No Painful intercourse?: No Weak stream?: No Currently pregnant?: No Vaginal bleeding?: No Last menstrual period?: n  Gastrointestinal Nausea?: No Vomiting?: No Indigestion/heartburn?: No Diarrhea?: No Constipation?: No  Constitutional Fever: No Night sweats?: No Weight loss?: No Fatigue?: No  Skin Skin rash/lesions?: No Itching?: No  Eyes Blurred vision?: No Double vision?: No  Ears/Nose/Throat Sore throat?:  No Sinus problems?: No  Hematologic/Lymphatic Swollen glands?: No Easy bruising?: No  Cardiovascular Leg swelling?: No Chest pain?: No  Respiratory Cough?: No Shortness of breath?: No  Endocrine Excessive thirst?: No  Musculoskeletal Back pain?: No Joint pain?: No  Neurological Headaches?: No Dizziness?: No  Psychologic Depression?: No Anxiety?: No  Physical Exam: Ht 5\' 3"  (1.6 m)  Wt 154 lb (69.854 kg)  BMI 27.29 kg/m2  Constitutional:  Alert and oriented, No acute distress.  Accompanied by husband today. HEENT: A[place] AT, moist mucus membranes.   Trachea midline, no masses. Cardiovascular: No clubbing, cyanosis, or edema. Respiratory: Normal respiratory effort, no increased work of breathing. GI: Abdomen is soft, nontender, nondistended, no abdominal masses GU: No CVA tenderness. Skin: No rashes, bruises or suspicious lesions. Neurologic: Grossly intact, no focal deficits, moving all 4 extremities.  Weakness with left tight flexion appreciated 3/5, otherwise left leg sensation and strength appears to be intact. Psychiatric: Normal mood and affect.  Laboratory Data: Lab Results  Component Value Date   WBC 13.8* 06/13/2015   HGB 13.4 06/13/2015   HCT 39.6 06/13/2015   MCV 92.0 06/13/2015   PLT 138* 06/13/2015    Lab Results  Component Value Date   CREATININE 0.99 07/10/2015    Pertinent Imaging: Study Result     CLINICAL DATA: Difficulty walking, urinary frequency, difficulty moving LEFT leg, hematuria, hypertension, former smoker  EXAM: CT ABDOMEN AND PELVIS WITHOUT CONTRAST  TECHNIQUE: Multidetector CT imaging of the abdomen and pelvis was performed following the standard protocol without IV contrast. Sagittal and coronal MPR images reconstructed from axial data set. Oral contrast was not administered.  COMPARISON: None  FINDINGS: Lung bases appear hyperinflated but clear.  Marked LEFT hydronephrosis without ureteral dilatation or calcification consistent with UPJ obstruction.  Within limits of a nonenhanced exam, no additional focal abnormalities of the liver, spleen, pancreas, kidneys, or adrenal glands.  Normal appendix lateral the cecum.  Scattered colonic diverticulosis greatest at sigmoid colon without evidence of diverticulitis.  Stomach and bowel loops otherwise normal appearance.  Bladder, ureters, uterus and adnexa unremarkable.  No mass, adenopathy, free air or free fluid.  Diffuse osseous demineralization with grade 1 spondylolisthesis L5-S1 secondary to BILATERAL  spondylolysis L5.  IMPRESSION: Marked LEFT hydronephrosis without ureteral dilatation or urinary tract calcification compatible with UPJ obstruction ; this could be due to stricture, tumor, radiolucent stone or edema due to prior stone passage.  Colonic diverticulosis without evidence of diverticulitis.   Electronically Signed  By: Lavonia Dana M.D.  On: 06/13/2015 18:26    Study Result     CLINICAL DATA: Evaluate the left hydronephrosis.  EXAM: NUCLEAR MEDICINE RENAL SCAN WITH DIURETIC ADMINISTRATION  TECHNIQUE: Radionuclide angiographic and sequential renal images were obtained after intravenous injection of radiopharmaceutical. Imaging was continued during slow intravenous injection of Lasix approximately 15 minutes after the start of the examination.  RADIOPHARMACEUTICALS: 5.4 mCi Technetium-69m MAG3 IV  COMPARISON: 06/13/2015  FINDINGS: Flow: There is normal perfusion to the right kidney. Delayed and diminished perfusion to the left kidney noted.  Left renogram: Delayed cortical uptake noted. Delayed excretion into a copious, hydronephrotic left renal collecting system noted. Markedly diminished clearance following Lasix.  Right renogram: Normal cortical uptake, excretion in clearance of the radiopharmaceutical.  Differential:  Left kidney = 37.5 %  Right kidney = 62.5 %  IMPRESSION: 1. Normally functioning right kidney. 2. Delayed perfusion, cortical uptake and excretion of the radiopharmaceutical by the hydronephrotic left kidney compatible with UPJ obstruction. 3. Split renal function  is equal to 62.5% in the right kidney and 37.5% from the left kidney.   Electronically Signed  By: Kerby Moors M.D.  On: 07/23/2015 14:45    Assessment & Plan:    1. UPJ (ureteropelvic junction) obstruction Chronic UPJ s/p repair 30 years ago with ongoing obstruction resulting in decreased left renal function based on lasix renogram.  She is otherwise completely symptomatic without recurrent UTIs, stones, flank pain (Dietel's crisis), or any other sequela.  Overall renal function preserved with normal functioning right kidney.    We had a lengthy discussion today about how to proceed. I explained that over time, she may continue to lose left renal function but the timeline for this is unclear and it is possible that this kidney has been partially obstructed her whole life and continues to maintain at least some function.  Other than an isolated UTI, she has no other symptomatic sequela.  Options include management with chronic indwelling stent which could be changed every 6 months, observation with serial ultrasounds and BMP, or a repeat repair, likely robotic dismembered pyeloplasty. We discussed that redo pyeloplasties have a success rate around 70%.  After carefully considering her options, she would prefer to continue with observation which is very reasonable. I reiterated that she may continue to lose renal function on the left but she is not particularly interested in surgical management including both minor and major procedures.  2. Hydronephrosis, left As above  3. Left leg weakness Etiology of left leg weakness is unclear but likely unrelated to left UPJ obstruction. Although she has fairly significant hydronephrosis on this side, presentation such as this would be higly unusual. I have recommended further workup with neurology and possibly physical therapy.  I will consult with several other experienced urologists in the community and get back to Mrs. Postlethwaite.   Return in about 6 months (around 02/23/2016) for recheck.  Hollice Espy, MD  [facility] [address] [place], [place] [address] [phone]  I spent 25 min with this patient of which greater than 50% was spent in counseling and coordination of care with the patient.

## 2015-09-09 ENCOUNTER — Encounter: Payer: Self-pay | Admitting: Urology

## 2015-09-17 DIAGNOSIS — M79642 Pain in left hand: Secondary | ICD-10-CM | POA: Diagnosis not present

## 2015-09-17 DIAGNOSIS — M1812 Unilateral primary osteoarthritis of first carpometacarpal joint, left hand: Secondary | ICD-10-CM | POA: Diagnosis not present

## 2015-11-07 DIAGNOSIS — L821 Other seborrheic keratosis: Secondary | ICD-10-CM | POA: Diagnosis not present

## 2015-11-07 DIAGNOSIS — Z85828 Personal history of other malignant neoplasm of skin: Secondary | ICD-10-CM | POA: Diagnosis not present

## 2015-11-07 DIAGNOSIS — L570 Actinic keratosis: Secondary | ICD-10-CM | POA: Diagnosis not present

## 2015-11-07 DIAGNOSIS — D225 Melanocytic nevi of trunk: Secondary | ICD-10-CM | POA: Diagnosis not present

## 2015-11-07 DIAGNOSIS — D2261 Melanocytic nevi of right upper limb, including shoulder: Secondary | ICD-10-CM | POA: Diagnosis not present

## 2015-11-07 DIAGNOSIS — X32XXXA Exposure to sunlight, initial encounter: Secondary | ICD-10-CM | POA: Diagnosis not present

## 2015-12-18 DIAGNOSIS — R269 Unspecified abnormalities of gait and mobility: Secondary | ICD-10-CM | POA: Diagnosis not present

## 2015-12-18 DIAGNOSIS — M21372 Foot drop, left foot: Secondary | ICD-10-CM

## 2015-12-18 HISTORY — DX: Foot drop, left foot: M21.372

## 2015-12-18 HISTORY — DX: Unspecified abnormalities of gait and mobility: R26.9

## 2015-12-28 DIAGNOSIS — M21372 Foot drop, left foot: Secondary | ICD-10-CM | POA: Diagnosis not present

## 2016-01-16 ENCOUNTER — Encounter: Payer: Self-pay | Admitting: Physical Therapy

## 2016-01-16 ENCOUNTER — Ambulatory Visit: Payer: PPO | Attending: Neurology | Admitting: Physical Therapy

## 2016-01-16 DIAGNOSIS — M6281 Muscle weakness (generalized): Secondary | ICD-10-CM | POA: Insufficient documentation

## 2016-01-16 DIAGNOSIS — R262 Difficulty in walking, not elsewhere classified: Secondary | ICD-10-CM | POA: Insufficient documentation

## 2016-01-16 NOTE — Patient Instructions (Addendum)
Balance, Proprioception: Hip Extension With Tubing    With tubing attached to ankle of uninvolved leg, swing leg back. Return. Repeat ____ times or for ____ minutes. Do ____ sessions per day.  http://cc.exer.us/19   Copyright  VHI. All rights reserved.  ABDUCTION: Standing - Resistance Band (Active)    Stand, feet flat. Against yellow resistance band, lift right leg out to side. Complete ___ sets of ___ repetitions. Perform ___ sessions per day.  http://gtsc.exer.us/117   Copyright  VHI. All rights reserved.  Sit to Stand / Stand to Sit / Transfers    Sit on edge of a solid chair with arms, feet flat on floor. Lean forward over feet and stand up with hands on chair arms. Sit down slowly with hands on chair arms. Repeat ____ times per session. Do ____ sessions per day.  Copyright  VHI. All rights reserved.

## 2016-01-16 NOTE — Therapy (Addendum)
[place] MAIN [facility] SERVICES [address] [place], [place], [address] Phone: [phone]   Fax:  [phone]  Physical Therapy Evaluation  Patient Details  Name: Abigail Hill MRN: [id number] Date of Birth: Aug 21, 1950 Referring Provider: Dr. Melrose Nakayama  Encounter Date: 01/16/2016      PT End of Session - 01/16/16 0930    Visit Number 1   Number of Visits 17   Date for PT Re-Evaluation 03-17-16   Authorization Type g codes   Authorization Time Period 1   PT Start Time 0915   PT Stop Time 1015   PT Time Calculation (min) 60 min   Equipment Utilized During Treatment Gait belt   Activity Tolerance Patient tolerated treatment well   Behavior During Therapy [facility] for tasks assessed/performed      Past Medical History:  Diagnosis Date  . Hypertension   . Recurrent UTI     Past Surgical History:  Procedure Laterality Date  . broken foot  2012  . kidney repair  1985   repaired torn blood vessel in kidney    There were no vitals filed for this visit.       Subjective Assessment - 01/16/16 0924    Subjective Patient is having unsteady gait and is now walking wit AFO on left ankle. She had some falls more than 6 months ago and recently needed an AFO. She has unsteady gait.    Patient is accompained by: Family member   Pertinent History surgery on right foot year ago, High blood pressure, uses Left AFO, lives with husband, has steps at front door   Limitations Standing;Walking;House hold activities   How long can you sit comfortably? unlimited   How long can you stand comfortably? 30 mins   How long can you walk comfortably? 15 mintues   Patient Stated Goals walk and be steady   Currently in Pain? No/denies   Multiple Pain Sites No            [facility] PT Assessment - 01/16/16 0001      Assessment   Medical Diagnosis abnormal gait   Referring Provider Dr. Wendie Agreste Dominance Right   Next MD Visit 04/14/16   Prior Therapy NO      Precautions   Precautions None     Restrictions   Weight Bearing Restrictions No     Balance Screen   Has the patient fallen in the past 6 months No   Has the patient had a decrease in activity level because of a fear of falling?  Yes   Is the patient reluctant to leave their home because of a fear of falling?  No     Home Environment   Living Environment Private residence   Living Arrangements Spouse/significant other   Available Help at Discharge Family   Type of [place] to enter   Entrance Stairs-Number of Steps 3   Entrance Stairs-Rails Right   [place] One level   [place] None;Grab bars - tub/shower     Prior Function   Level of Independence Independent with basic ADLs;Independent with gait   Vocation Retired       PAIN: no reports of pain  POSTURE:WNL    PROM/AROM: decreased flexibility of B hip flexors with + Elys bilaterally  STRENGTH:  Graded on a 0-5 scale Muscle Group Left Right  Shoulder flex    Shoulder Abd    Shoulder Ext  Shoulder IR/ER    Elbow    Wrist/hand    Hip Flex 3+/5 4/5  Hip Abd 3+/5 3+/5  Hip Add 2/5 2/5  Hip Ext 2/5 2/5  Hip IR/ER    Knee Flex 5/5 5/5  Knee Ext 5/5 5/5  Ankle DF    Ankle PF -4/5 4/5   SENSATION: WNL  SPECIAL TESTS:+ elys  FUNCTIONAL MOBILITY: independent   BALANCE:berg balance 44/56   GAIT: Ambulates with wide base of support and L AFO with slow gait speed   OUTCOME MEASURES: TEST Outcome Interpretation  5 times sit<>stand 13.41sec >60 yo, >15 sec indicates increased risk for falls  10 meter walk test .64                m/s <1.0 m/s indicates increased risk for falls; limited community ambulator  Timed up and Go      17.64           sec <14 sec indicates increased risk for falls      Berg Balance Assessment 44/56 <36/56 (100% risk for falls), 37-45 (80% risk for falls); 46-51 (>50% risk for falls); 52-55 (lower risk <25% of falls)         Therapeutic  exercise: Standing hip extension and hip abduction with RTB x 15 x 2 Patient was able to perform all exercises with posture correction.                      PT Education - 01/16/16 0929    Education provided Yes   Education Details HEP, Plan of care   Person(s) Educated Patient   Methods Explanation   Comprehension Verbalized understanding             PT Long Term Goals - 01/16/16 1022      PT LONG TERM GOAL #1   Title Patient will be independent in home exercise program to improve strength/mobility for better functional independence with ADLs.   Time 8   Period Weeks   Status New     PT LONG TERM GOAL #2   Title Patient (> 31 years old) will complete five times sit to stand test in < 15 seconds indicating an increased LE strength and improved balance.   Time 8   Period Weeks   Status New     PT LONG TERM GOAL #3   Title Patient will increase Berg Balance score by > 6 points to demonstrate decreased fall risk during functional activities.   Time 8   Period Weeks   Status New     PT LONG TERM GOAL #4   Title Patient will tolerate 5 seconds of single leg stance without loss of balance to improve ability to get in and out of shower safely.   Time 8   Period Weeks   Status New     PT LONG TERM GOAL #5   Title Patient will reduce timed up and go to <11 seconds to reduce fall risk and demonstrate improved transfer/gait ability.   Time 8   Period Weeks   Status New               Plan - 01/16/16 1006    Clinical Impression Statement Patient has unsteady gait, weakness in BLE and decreased stadning balance as indicated wiht 44/56 berg. She has decreased gait speed and has falls risk indicated by outcome measaures.    Rehab Potential Good   Clinical Impairments Affecting Rehab Potential Patient wears  AFO on RLE and has impaired flexibility in BLE hips   PT Frequency 2x / week   PT Duration 8 weeks   PT Treatment/Interventions Therapeutic  exercise;Balance training;Neuromuscular re-education;Therapeutic activities   PT Next Visit Plan dynamic standing balance and strengthening   Consulted and Agree with Plan of Care Patient      Patient will benefit from skilled therapeutic intervention in order to improve the following deficits and impairments:  Abnormal gait, Decreased balance, Decreased mobility, Difficulty walking, Impaired flexibility, Decreased strength  Visit Diagnosis: Difficulty in walking, not elsewhere classified - Plan: PT plan of care cert/re-cert  Muscle weakness (generalized) - Plan: PT plan of care cert/re-cert      G-Codes - [id number]    Functional Assessment Tool Used TUG, 6 MW, , 5 x sit to stand   Functional Limitation Mobility: Walking and moving around   Mobility: Walking and Moving Around Current Status [id number]) At least 40 percent but less than 60 percent impaired, limited or restricted   Mobility: Walking and Moving Around Goal Status [phone]) At least 20 percent but less than 40 percent impaired, limited or restricted       Problem List Patient Active Problem List   Diagnosis Date Noted  . BP (high blood pressure) 08/24/2015  . Hydronephrosis, left 07/28/2015  . UPJ (ureteropelvic junction) obstruction 07/28/2015  . Constriction of ureter (postoperative) 07/28/2015  . UTI (lower urinary tract infection) 07/14/2015  . Hydronephrosis 07/14/2015   Alanson Puls, PT, DPT [place], Minette Headland S 01/16/2016, 1:43 PM  [place] MAIN [facility] SERVICES [address] [place], [place], [address] Phone: [phone]   Fax:  [phone]  Name: Abigail Hill MRN: [id number] Date of Birth: 12-03-1950

## 2016-01-21 ENCOUNTER — Ambulatory Visit: Payer: PPO | Admitting: Physical Therapy

## 2016-01-21 ENCOUNTER — Encounter: Payer: Self-pay | Admitting: Physical Therapy

## 2016-01-21 DIAGNOSIS — M6281 Muscle weakness (generalized): Secondary | ICD-10-CM

## 2016-01-21 DIAGNOSIS — R262 Difficulty in walking, not elsewhere classified: Secondary | ICD-10-CM

## 2016-01-21 NOTE — Therapy (Addendum)
[place] MAIN [facility] SERVICES [address] [place], [place], [address] Phone: [phone]   Fax:  [phone]  Physical Therapy Treatment  Patient Details  Name: Abigail Hill MRN: [id number] Date of Birth: 10/02/1950 Referring Provider: Dr. Melrose Nakayama  Encounter Date: 01/21/2016      PT End of Session - 01/21/16 0920    Visit Number 2   Number of Visits 17   Date for PT Re-Evaluation 03/20/16   Authorization Type g codes   PT Start Time 0915   PT Stop Time 1000   PT Time Calculation (min) 45 min   Equipment Utilized During Treatment Gait belt   Activity Tolerance Patient tolerated treatment well   Behavior During Therapy [facility] for tasks assessed/performed      Past Medical History:  Diagnosis Date  . Hypertension   . Recurrent UTI     Past Surgical History:  Procedure Laterality Date  . broken foot  09-Jun-2010  . kidney repair  1985   repaired torn blood vessel in kidney    There were no vitals filed for this visit.      Subjective Assessment - 01/21/16 0919    Subjective Patient is having unsteady gait and is now walking wit AFO on left ankle. She had some falls more than 6 months ago and recently needed an AFO. She has unsteady gait.    Patient is accompained by: Family member   Pertinent History surgery on right foot year ago, High blood pressure, uses Left AFO, lives with husband, has steps at front door   Limitations Standing;Walking;House hold activities   How long can you sit comfortably? unlimited   How long can you stand comfortably? 30 mins   How long can you walk comfortably? 15 mintues   Patient Stated Goals walk and be steady     Currently in Pain? No/denies    Multiple Pain Sites No      NMR: Side stepping with head control Stepping onto AIREX, then on to 4 inch step followed by stepping down onto AIREX and then level surface in //bars x15.  Pt required occasional UE assist, and performance improved with each  repetition   Stepping over and back  From floor to 6 inch stool x10 bilaterally   Stepping over and back side stepping over 6 inch stool x`10 bilaterally with verbal cueing to go slowly Balance on foam feet apart  fwd and bil side stepping over 1/2 foam roll with no UE support,fwd stepping over 1/2 foam  1x15 each, CGA for balance, min cues for heel strike with fwd step over bil toe taps on 6" step, 2x10 each  Leg press 75 lbs x 20 x 3 Hip abd and extension with RTB x 15 x 2. Patient needs occasional verbal cueing to improve posture and cueing to correctly perform exercises slowly, holding at end of range to increase motor firing of desired muscle to encourage fatigue.                              PT Education - 01/21/16 0920    Education provided Yes   Education Details HEP   Person(s) Educated Patient   Methods Explanation   Comprehension Verbalized understanding             PT Long Term Goals - 01/16/16 1022      PT LONG TERM GOAL #1   Title Patient will be  independent in home exercise program to improve strength/mobility for better functional independence with ADLs.   Time 8   Period Weeks   Status New     PT LONG TERM GOAL #2   Title Patient (> 57 years old) will complete five times sit to stand test in < 15 seconds indicating an increased LE strength and improved balance.   Time 8   Period Weeks   Status New     PT LONG TERM GOAL #3   Title Patient will increase Berg Balance score by > 6 points to demonstrate decreased fall risk during functional activities.   Time 8   Period Weeks   Status New     PT LONG TERM GOAL #4   Title Patient will tolerate 5 seconds of single leg stance without loss of balance to improve ability to get in and out of shower safely.   Time 8   Period Weeks   Status New     PT LONG TERM GOAL #5   Title Patient will reduce timed up and go to <11 seconds to reduce fall risk and demonstrate improved transfer/gait  ability.   Time 8   Period Weeks   Status New               Plan - 01/21/16 0920    Clinical Impression Statement Fatigue with sit to stand but demonstrating decreased control, Increase weight for standing exercises. Fatigue still evident with TM and endurance.    Rehab Potential Good   Clinical Impairments Affecting Rehab Potential Patient wears AFO on RLE and has impaired flexibility in BLE hips   PT Frequency 2x / week   PT Duration 8 weeks   PT Treatment/Interventions Therapeutic exercise;Balance training;Neuromuscular re-education;Therapeutic activities   PT Next Visit Plan dynamic standing balance and strengthening   Consulted and Agree with Plan of Care Patient      Patient will benefit from skilled therapeutic intervention in order to improve the following deficits and impairments:  Abnormal gait, Decreased balance, Decreased mobility, Difficulty walking, Impaired flexibility, Decreased strength  Visit Diagnosis: Muscle weakness (generalized)  Difficulty in walking, not elsewhere classified     Problem List Patient Active Problem List   Diagnosis Date Noted  . BP (high blood pressure) 08/24/2015  . Hydronephrosis, left 07/28/2015  . UPJ (ureteropelvic junction) obstruction 07/28/2015  . Constriction of ureter (postoperative) 07/28/2015  . UTI (lower urinary tract infection) 07/14/2015  . Hydronephrosis 07/14/2015   Alanson Puls, PT, DPT [place], Minette Headland S 01/21/2016, 9:22 AM  [place] MAIN [facility] SERVICES [address] [place], [place], [address] Phone: [phone]   Fax:  [phone]  Name: Abigail Hill MRN: [id number] Date of Birth: 06/30/1950

## 2016-01-23 ENCOUNTER — Encounter: Payer: Self-pay | Admitting: Physical Therapy

## 2016-01-23 ENCOUNTER — Ambulatory Visit: Payer: PPO | Admitting: Physical Therapy

## 2016-01-23 VITALS — BP 154/89

## 2016-01-23 DIAGNOSIS — R262 Difficulty in walking, not elsewhere classified: Secondary | ICD-10-CM | POA: Diagnosis not present

## 2016-01-23 DIAGNOSIS — M6281 Muscle weakness (generalized): Secondary | ICD-10-CM

## 2016-01-23 DIAGNOSIS — I10 Essential (primary) hypertension: Secondary | ICD-10-CM | POA: Diagnosis not present

## 2016-01-23 NOTE — Therapy (Signed)
[place] MAIN [facility] SERVICES [address] [place], [place], [address] Phone: [phone]   Fax:  [phone]  Physical Therapy Treatment  Patient Details  Name: Abigail Hill MRN: [id number] Date of Birth: Nov 17, 1950 Referring Provider: Dr. Melrose Nakayama  Encounter Date: 01/23/2016      PT End of Session - 01/23/16 0934    Visit Number 3   Number of Visits 17   Date for PT Re-Evaluation 2016-03-22   Authorization Type g codes   PT Start Time 0918   PT Stop Time 1000   PT Time Calculation (min) 42 min   Equipment Utilized During Treatment Gait belt   Activity Tolerance Patient tolerated treatment well   Behavior During Therapy [facility] for tasks assessed/performed      Past Medical History:  Diagnosis Date  . Hypertension   . Recurrent UTI     Past Surgical History:  Procedure Laterality Date  . broken foot  2010-06-11  . kidney repair  1985   repaired torn blood vessel in kidney    Vitals:   01/23/16 0924  BP: (!) 154/89        Subjective Assessment - 01/23/16 0923    Subjective Pt reports she is doing well today with no complaints or concerns.  She has a question about her balance exercise where she stands in the corner of a room.     Patient is accompained by: Family member   Pertinent History surgery on right foot year ago, High blood pressure, uses Left AFO, lives with husband, has steps at front door   Limitations Standing;Walking;House hold activities   How long can you sit comfortably? unlimited   How long can you stand comfortably? 30 mins   How long can you walk comfortably? 15 mintues   Patient Stated Goals walk and be steady   Currently in Pain? No/denies       TREATMENT   Neuromuscular Rehab:  Tandem stance in a corner with chair in front. 2x30 seconds with each foot forward.  Alternating toe taps up to 8" step while standing on airex, 2x20   Therapeutic Exercise:  Sit<>stand 2x10 with RTB around knees for glute  activation. Cues for eccentric control. No use of UEs.  Standing hip 4 way using RTB 1x10 each LE, cues for glute activation with abduction and extension  Forward lunges onto Bosu ball 2x10 with each LE                  PT Education - 01/23/16 0925    Education provided Yes   Education Details Exercise technique; review of HEP exercise   Person(s) Educated Patient   Methods Explanation;Demonstration   Comprehension Verbalized understanding;Returned demonstration;Need further instruction             PT Long Term Goals - 01/16/16 1022      PT LONG TERM GOAL #1   Title Patient will be independent in home exercise program to improve strength/mobility for better functional independence with ADLs.   Time 8   Period Weeks   Status New     PT LONG TERM GOAL #2   Title Patient (> 38 years old) will complete five times sit to stand test in < 15 seconds indicating an increased LE strength and improved balance.   Time 8   Period Weeks   Status New     PT LONG TERM GOAL #3   Title Patient will increase Berg Balance score by > 6  points to demonstrate decreased fall risk during functional activities.   Time 8   Period Weeks   Status New     PT LONG TERM GOAL #4   Title Patient will tolerate 5 seconds of single leg stance without loss of balance to improve ability to get in and out of shower safely.   Time 8   Period Weeks   Status New     PT LONG TERM GOAL #5   Title Patient will reduce timed up and go to <11 seconds to reduce fall risk and demonstrate improved transfer/gait ability.   Time 8   Period Weeks   Status New               Plan - 01/23/16 [id number]    Clinical Impression Statement Pt reports fatigue with si<>stand and requires cues for upright posture once upright.  She demonstrates increased postural sway with balance exercises but remains very motivated and will continue to benefit from skilled PT interventions for improved strength and balance.    Rehab Potential Good   Clinical Impairments Affecting Rehab Potential Patient wears AFO on RLE and has impaired flexibility in BLE hips   PT Frequency 2x / week   PT Duration 8 weeks   PT Treatment/Interventions Therapeutic exercise;Balance training;Neuromuscular re-education;Therapeutic activities   PT Next Visit Plan dynamic standing balance and strengthening   Consulted and Agree with Plan of Care Patient      Patient will benefit from skilled therapeutic intervention in order to improve the following deficits and impairments:  Abnormal gait, Decreased balance, Decreased mobility, Difficulty walking, Impaired flexibility, Decreased strength  Visit Diagnosis: Muscle weakness (generalized)  Difficulty in walking, not elsewhere classified     Problem List Patient Active Problem List   Diagnosis Date Noted  . BP (high blood pressure) 08/24/2015  . Hydronephrosis, left 07/28/2015  . UPJ (ureteropelvic junction) obstruction 07/28/2015  . Constriction of ureter (postoperative) 07/28/2015  . UTI (lower urinary tract infection) 07/14/2015  . Hydronephrosis 07/14/2015     Collie Siad PT, DPT 01/23/2016, 10:02 AM  [place] MAIN [facility] SERVICES [address] [place], [place], [address] Phone: [phone]   Fax:  [phone]  Name: Abigail Hill MRN: [id number] Date of Birth: 01-16-1951

## 2016-01-28 ENCOUNTER — Ambulatory Visit: Payer: PPO | Admitting: Physical Therapy

## 2016-01-28 ENCOUNTER — Encounter: Payer: Self-pay | Admitting: Physical Therapy

## 2016-01-28 DIAGNOSIS — R262 Difficulty in walking, not elsewhere classified: Secondary | ICD-10-CM

## 2016-01-28 DIAGNOSIS — M6281 Muscle weakness (generalized): Secondary | ICD-10-CM

## 2016-01-28 NOTE — Therapy (Addendum)
[place] MAIN [facility] SERVICES [address] [place], [place], [address] Phone: [phone]   Fax:  [phone]  Physical Therapy Treatment  Patient Details  Name: Abigail Hill MRN: [id number] Date of Birth: Feb 25, 1951 Referring Provider: Dr. Melrose Nakayama  Encounter Date: 01/28/2016      PT End of Session - 01/28/16 0935    Visit Number 4   Number of Visits 17   Date for PT Re-Evaluation 03/12/16   Authorization Type  4 g codes   PT Start Time 0920   PT Stop Time 1000   PT Time Calculation (min) 40 min   Equipment Utilized During Treatment Gait belt   Activity Tolerance Patient tolerated treatment well   Behavior During Therapy [facility] for tasks assessed/performed      Past Medical History:  Diagnosis Date  . Hypertension   . Recurrent UTI     Past Surgical History:  Procedure Laterality Date  . broken foot  06/10/2010  . kidney repair  1985   repaired torn blood vessel in kidney    There were no vitals filed for this visit.      Subjective Assessment - 01/28/16 0934    Subjective Patient reports that she feels unsteady and slow.    Patient is accompained by: Family member   Pertinent History surgery on right foot year ago, High blood pressure, uses Left AFO, lives with husband, has steps at front door   Limitations Standing;Walking;House hold activities   How long can you sit comfortably? unlimited   How long can you stand comfortably? 30 mins   How long can you walk comfortably? 15 mintues   Patient Stated Goals walk and be steady     Currently in Pain? No/denies    Multiple Pain Sites No     TREATMENT Therapeutic exercise; Blue foam standing tapping stool x 20 x 2 slde stepping on TM left and right with . 3 Miles / hour 4 square fwd/bwd/ side stepping left and right/ diagonals Tapping on steps x 20  Ascending/descending steps with railing  Side stepping with RTB x 4 lengths Step ups from blue foam x 10 x 2 1/2 foam  standing without UE support Patient continues to demonstrates less incoordination of movement with select exercises. Patient responds well to verbal and tactile cues to correct form and technique.                          PT Education - 01/28/16 0935    Education provided Yes   Education Details HEP   Person(s) Educated Patient   Methods Explanation   Comprehension Verbalized understanding             PT Long Term Goals - 01/16/16 1022      PT LONG TERM GOAL #1   Title Patient will be independent in home exercise program to improve strength/mobility for better functional independence with ADLs.   Time 8   Period Weeks   Status New     PT LONG TERM GOAL #2   Title Patient (> 18 years old) will complete five times sit to stand test in < 15 seconds indicating an increased LE strength and improved balance.   Time 8   Period Weeks   Status New     PT LONG TERM GOAL #3   Title Patient will increase Berg Balance score by > 6 points to demonstrate decreased fall risk during functional activities.  Time 8   Period Weeks   Status New     PT LONG TERM GOAL #4   Title Patient will tolerate 5 seconds of single leg stance without loss of balance to improve ability to get in and out of shower safely.   Time 8   Period Weeks   Status New     PT LONG TERM GOAL #5   Title Patient will reduce timed up and go to <11 seconds to reduce fall risk and demonstrate improved transfer/gait ability.   Time 8   Period Weeks   Status New               Plan - 01/28/16 0936    Clinical Impression Statement Patient needs occasional verbal cueing to improve posture and cueing to correctly perform exercises slowly, holding at end of range to increase motor firing of desired muscle to encourage fatigue.   Rehab Potential Good   Clinical Impairments Affecting Rehab Potential Patient wears AFO on RLE and has impaired flexibility in BLE hips   PT Frequency 2x / week   PT  Duration 8 weeks   PT Treatment/Interventions Therapeutic exercise;Balance training;Neuromuscular re-education;Therapeutic activities   PT Next Visit Plan dynamic standing balance and strengthening   Consulted and Agree with Plan of Care Patient      Patient will benefit from skilled therapeutic intervention in order to improve the following deficits and impairments:  Abnormal gait, Decreased balance, Decreased mobility, Difficulty walking, Impaired flexibility, Decreased strength  Visit Diagnosis: Muscle weakness (generalized)  Difficulty in walking, not elsewhere classified     Problem List Patient Active Problem List   Diagnosis Date Noted  . BP (high blood pressure) 08/24/2015  . Hydronephrosis, left 07/28/2015  . UPJ (ureteropelvic junction) obstruction 07/28/2015  . Constriction of ureter (postoperative) 07/28/2015  . UTI (lower urinary tract infection) 07/14/2015  . Hydronephrosis 07/14/2015  Alanson Puls, PT, DPT  [name] 01/28/2016, 9:37 AM  [place] MAIN [facility] SERVICES [address] [place], [place], [address] Phone: [phone]   Fax:  [phone]  Name: Abigail Hill MRN: [id number] Date of Birth: September 18, 1950

## 2016-01-30 ENCOUNTER — Ambulatory Visit: Payer: PPO | Admitting: Physical Therapy

## 2016-01-30 DIAGNOSIS — R262 Difficulty in walking, not elsewhere classified: Secondary | ICD-10-CM | POA: Diagnosis not present

## 2016-01-30 DIAGNOSIS — M6281 Muscle weakness (generalized): Secondary | ICD-10-CM

## 2016-01-30 NOTE — Therapy (Addendum)
[place] MAIN [facility] SERVICES [address] [place], [place], [address] Phone: [phone]   Fax:  [phone]  Physical Therapy Treatment  Patient Details  Name: Abigail Hill MRN: [id number] Date of Birth: Jan 08, 1951 Referring Provider: Dr. Melrose Nakayama  Encounter Date: 01/30/2016      PT End of Session - 01/30/16 0954    Visit Number 5   Number of Visits 17   Date for PT Re-Evaluation 03/12/16   Authorization Type  5 g codes   PT Start Time 0915   PT Stop Time 1000   PT Time Calculation (min) 45 min   Equipment Utilized During Treatment Gait belt   Activity Tolerance Patient tolerated treatment well   Behavior During Therapy [facility] for tasks assessed/performed      Past Medical History:  Diagnosis Date  . Hypertension   . Recurrent UTI     Past Surgical History:  Procedure Laterality Date  . broken foot  06/26/10  . kidney repair  1985   repaired torn blood vessel in kidney    There were no vitals filed for this visit.      Subjective Assessment - 01/30/16 0953    Subjective Patient reports that she feels unsteady and slow.    Pertinent History surgery on right foot year ago, High blood pressure, uses Left AFO, lives with husband, has steps at front door   Currently in Pain? No/denies      Therapeutic exercise and neuromuscular training: 1/2 foam flat side up and balance with head turns left and right feet apart and feet together,  tandem standing on 1/2 foam   standing hip abd with YTB x 20   side stepping left and right in parallel bars 10 feet x 3 step ups from floor to 6 inch stool x 20 bilateral marching in parallel bars x 20 Tilt board fwd/bwd, side to side left and right  Patient needs occasional verbal cueing to improve posture and cueing to correctly perform exercises slowly, holding at end of range to increase motor firing of desired muscle to encourage fatigue.                           PT  Education - 01/30/16 0954    Education provided Yes   Education Details HEP   Person(s) Educated Patient   Methods Explanation   Comprehension Verbalized understanding             PT Long Term Goals - 01/16/16 1022      PT LONG TERM GOAL #1   Title Patient will be independent in home exercise program to improve strength/mobility for better functional independence with ADLs.   Time 8   Period Weeks   Status New     PT LONG TERM GOAL #2   Title Patient (> 24 years old) will complete five times sit to stand test in < 15 seconds indicating an increased LE strength and improved balance.   Time 8   Period Weeks   Status New     PT LONG TERM GOAL #3   Title Patient will increase Berg Balance score by > 6 points to demonstrate decreased fall risk during functional activities.   Time 8   Period Weeks   Status New     PT LONG TERM GOAL #4   Title Patient will tolerate 5 seconds of single leg stance without loss of balance to improve ability to get  in and out of shower safely.   Time 8   Period Weeks   Status New     PT LONG TERM GOAL #5   Title Patient will reduce timed up and go to <11 seconds to reduce fall risk and demonstrate improved transfer/gait ability.   Time 8   Period Weeks   Status New               Plan - 01/30/16 0956    Clinical Impression Statement Pt demonstrates increased postural sway when standing on uneven surface and requires // bars to steady, and demonstrates fatigue at end of set of exercises focused on strength and endurance.  Patient will continue to benefit from skilled PT for improved balance and strength.   Rehab Potential Good   Clinical Impairments Affecting Rehab Potential Patient wears AFO on RLE and has impaired flexibility in BLE hips   PT Frequency 2x / week   PT Duration 8 weeks   PT Treatment/Interventions Therapeutic exercise;Balance training;Neuromuscular re-education;Therapeutic activities   PT Next Visit Plan dynamic  standing balance and strengthening   Consulted and Agree with Plan of Care Patient      Patient will benefit from skilled therapeutic intervention in order to improve the following deficits and impairments:  Abnormal gait, Decreased balance, Decreased mobility, Difficulty walking, Impaired flexibility, Decreased strength  Visit Diagnosis: Muscle weakness (generalized)  Difficulty in walking, not elsewhere classified     Problem List Patient Active Problem List   Diagnosis Date Noted  . BP (high blood pressure) 08/24/2015  . Hydronephrosis, left 07/28/2015  . UPJ (ureteropelvic junction) obstruction 07/28/2015  . Constriction of ureter (postoperative) 07/28/2015  . UTI (lower urinary tract infection) 07/14/2015  . Hydronephrosis 07/14/2015   Alanson Puls, PT, DPT [place], Minette Headland S 01/30/2016, 10:03 AM  [place] MAIN [facility] SERVICES [address] [place], [place], [address] Phone: [phone]   Fax:  [phone]  Name: Abigail Hill MRN: [id number] Date of Birth: 29-Nov-1950

## 2016-01-31 ENCOUNTER — Other Ambulatory Visit: Payer: Self-pay | Admitting: Internal Medicine

## 2016-01-31 DIAGNOSIS — Z0001 Encounter for general adult medical examination with abnormal findings: Secondary | ICD-10-CM | POA: Diagnosis not present

## 2016-01-31 DIAGNOSIS — Z1231 Encounter for screening mammogram for malignant neoplasm of breast: Secondary | ICD-10-CM

## 2016-01-31 DIAGNOSIS — I10 Essential (primary) hypertension: Secondary | ICD-10-CM | POA: Diagnosis not present

## 2016-01-31 DIAGNOSIS — M21372 Foot drop, left foot: Secondary | ICD-10-CM | POA: Diagnosis not present

## 2016-01-31 DIAGNOSIS — Z23 Encounter for immunization: Secondary | ICD-10-CM | POA: Diagnosis not present

## 2016-01-31 DIAGNOSIS — Z0000 Encounter for general adult medical examination without abnormal findings: Secondary | ICD-10-CM | POA: Diagnosis not present

## 2016-01-31 DIAGNOSIS — E2839 Other primary ovarian failure: Secondary | ICD-10-CM | POA: Diagnosis not present

## 2016-01-31 DIAGNOSIS — N135 Crossing vessel and stricture of ureter without hydronephrosis: Secondary | ICD-10-CM | POA: Diagnosis not present

## 2016-02-04 DIAGNOSIS — H04123 Dry eye syndrome of bilateral lacrimal glands: Secondary | ICD-10-CM | POA: Diagnosis not present

## 2016-02-04 DIAGNOSIS — H524 Presbyopia: Secondary | ICD-10-CM | POA: Diagnosis not present

## 2016-02-04 DIAGNOSIS — H35363 Drusen (degenerative) of macula, bilateral: Secondary | ICD-10-CM | POA: Diagnosis not present

## 2016-02-04 DIAGNOSIS — H5203 Hypermetropia, bilateral: Secondary | ICD-10-CM | POA: Diagnosis not present

## 2016-02-04 DIAGNOSIS — H52223 Regular astigmatism, bilateral: Secondary | ICD-10-CM | POA: Diagnosis not present

## 2016-02-04 DIAGNOSIS — H3562 Retinal hemorrhage, left eye: Secondary | ICD-10-CM | POA: Diagnosis not present

## 2016-02-04 DIAGNOSIS — H2513 Age-related nuclear cataract, bilateral: Secondary | ICD-10-CM | POA: Diagnosis not present

## 2016-02-04 DIAGNOSIS — I10 Essential (primary) hypertension: Secondary | ICD-10-CM | POA: Diagnosis not present

## 2016-02-06 ENCOUNTER — Encounter: Payer: Self-pay | Admitting: Physical Therapy

## 2016-02-06 ENCOUNTER — Ambulatory Visit: Payer: PPO | Attending: Neurology | Admitting: Physical Therapy

## 2016-02-06 DIAGNOSIS — R262 Difficulty in walking, not elsewhere classified: Secondary | ICD-10-CM | POA: Insufficient documentation

## 2016-02-06 DIAGNOSIS — M6281 Muscle weakness (generalized): Secondary | ICD-10-CM | POA: Diagnosis not present

## 2016-02-06 NOTE — Therapy (Signed)
[place] MAIN [facility] SERVICES [address] [place], [place], [address] Phone: [phone]   Fax:  [phone]  Physical Therapy Treatment  Patient Details  Name: Abigail Hill MRN: [id number] Date of Birth: June 29, 1950 Referring Provider: Dr. Melrose Nakayama  Encounter Date: 02/06/2016      PT End of Session - 02/06/16 0927    Visit Number 6   Number of Visits 17   Date for PT Re-Evaluation 03/12/16   Authorization Type  6 g codes   PT Start Time 0915   PT Stop Time 1000   PT Time Calculation (min) 45 min   Equipment Utilized During Treatment Gait belt   Activity Tolerance Patient tolerated treatment well   Behavior During Therapy [facility] for tasks assessed/performed      Past Medical History:  Diagnosis Date  . Hypertension   . Recurrent UTI     Past Surgical History:  Procedure Laterality Date  . broken foot  Jun 20, 2010  . kidney repair  1985   repaired torn blood vessel in kidney    There were no vitals filed for this visit.      Subjective Assessment - 02/06/16 0926    Subjective Patient reports that she feels unsteady and slow.    Patient is accompained by: Family member   Pertinent History surgery on right foot year ago, High blood pressure, uses Left AFO, lives with husband, has steps at front door   Limitations Standing;Walking;House hold activities   How long can you sit comfortably? unlimited   How long can you stand comfortably? 30 mins   How long can you walk comfortably? 15 mintues   Patient Stated Goals walk and be steady   Currently in Pain? No/denies   Multiple Pain Sites No           Therapeutic exercise: Quantum double leg press 100 lbs x 20 x 3 Heel raises with UE support and toes on 2x4, 2 x 10; Mini squats with RTB around knees to prevent valgus 2 x 10; RTB side stepping in // bars 4 lengths x 2; Sit to stand without UE support RTB  LAQs 2x10 Standing Jun 20, 2022  2x10 with 3# Standing hip SLR, abd, ext with GTB  2x10 each Seated clamshell with GTB 2x10 Sit to stand without UE support 2x10 CGA when stepping up with LLE and min A for RLE due to increased weakness and unsteadiness with RLE Min cueing needed to appropriately perform strengthening tasks with leg, hand, and head position.                       PT Education - 02/06/16 0926    Education provided Yes   Education Details HEP   Person(s) Educated Patient   Methods Explanation   Comprehension Verbalized understanding             PT Long Term Goals - 01/16/16 1022      PT LONG TERM GOAL #1   Title Patient will be independent in home exercise program to improve strength/mobility for better functional independence with ADLs.   Time 8   Period Weeks   Status New     PT LONG TERM GOAL #2   Title Patient (> 60 years old) will complete five times sit to stand test in < 15 seconds indicating an increased LE strength and improved balance.   Time 8   Period Weeks   Status New     PT LONG  TERM GOAL #3   Title Patient will increase Berg Balance score by > 6 points to demonstrate decreased fall risk during functional activities.   Time 8   Period Weeks   Status New     PT LONG TERM GOAL #4   Title Patient will tolerate 5 seconds of single leg stance without loss of balance to improve ability to get in and out of shower safely.   Time 8   Period Weeks   Status New     PT LONG TERM GOAL #5   Title Patient will reduce timed up and go to <11 seconds to reduce fall risk and demonstrate improved transfer/gait ability.   Time 8   Period Weeks   Status New               Plan - 02/06/16 [id number]    Clinical Impression Statement Patient cotinues to have decreased strength of BLE and has wide base of support and lateral sway with stepping and gait and will continue to benefit from skilled PT to improve falls risk and strength deficits.   Rehab Potential Good   Clinical Impairments Affecting Rehab Potential Patient  wears AFO on RLE and has impaired flexibility in BLE hips   PT Frequency 2x / week   PT Duration 8 weeks   PT Treatment/Interventions Therapeutic exercise;Balance training;Neuromuscular re-education;Therapeutic activities   PT Next Visit Plan dynamic standing balance and strengthening   Consulted and Agree with Plan of Care Patient      Patient will benefit from skilled therapeutic intervention in order to improve the following deficits and impairments:  Abnormal gait, Decreased balance, Decreased mobility, Difficulty walking, Impaired flexibility, Decreased strength  Visit Diagnosis: Muscle weakness (generalized)  Difficulty in walking, not elsewhere classified     Problem List Patient Active Problem List   Diagnosis Date Noted  . BP (high blood pressure) 08/24/2015  . Hydronephrosis, left 07/28/2015  . UPJ (ureteropelvic junction) obstruction 07/28/2015  . Constriction of ureter (postoperative) 07/28/2015  . UTI (lower urinary tract infection) 07/14/2015  . Hydronephrosis 07/14/2015   Alanson Puls, PT, DPT [name] 02/06/2016, 9:31 AM  [place] MAIN [facility] SERVICES [address] [place], [place], [address] Phone: [phone]   Fax:  [phone]  Name: Abigail Hill MRN: [id number] Date of Birth: 1950-11-01

## 2016-02-11 ENCOUNTER — Ambulatory Visit: Payer: PPO | Admitting: Physical Therapy

## 2016-02-11 ENCOUNTER — Encounter: Payer: Self-pay | Admitting: Physical Therapy

## 2016-02-11 DIAGNOSIS — M6281 Muscle weakness (generalized): Secondary | ICD-10-CM | POA: Diagnosis not present

## 2016-02-11 DIAGNOSIS — R262 Difficulty in walking, not elsewhere classified: Secondary | ICD-10-CM

## 2016-02-11 NOTE — Therapy (Addendum)
[place] MAIN [facility] SERVICES [address] [place], [place], [address] Phone: [phone]   Fax:  [phone]  Physical Therapy Treatment  Patient Details  Name: Abigail Hill MRN: [id number] Date of Birth: March 12, 1950 Referring Provider: Dr. Melrose Nakayama  Encounter Date: 02/11/2016      PT End of Session - 02/11/16 0940    Visit Number 7   Number of Visits 17   Date for PT Re-Evaluation 03/12/16   Authorization Type  7 g codes   PT Start Time 0915   PT Stop Time 1000   PT Time Calculation (min) 45 min   Equipment Utilized During Treatment Gait belt   Activity Tolerance Patient tolerated treatment well   Behavior During Therapy [facility] for tasks assessed/performed      Past Medical History:  Diagnosis Date  . Hypertension   . Recurrent UTI     Past Surgical History:  Procedure Laterality Date  . broken foot  06-14-10  . kidney repair  1985   repaired torn blood vessel in kidney    There were no vitals filed for this visit.      Subjective Assessment - 02/11/16 0937    Subjective Patient reports that she feels unsteady and slow.    Pertinent History surgery on right foot year ago, High blood pressure, uses Left AFO, lives with husband, has steps at front door   Limitations Standing;Walking;House hold activities   How long can you sit comfortably? unlimited   How long can you stand comfortably? 30 mins   How long can you walk comfortably? 15 mintues   Patient Stated Goals walk and be steady   Currently in Pain? No/denies   Multiple Pain Sites No      NEUROMUSCULAR RE-EDUCATION Airex NBOS eyes open x 30 seconds each; Airex NBOS eyes open horizontal and vertical head turns x 30 seconds; Airex cone  reaching crossing midline  Toe tapping 6 inch stool without UE assist Tandem gait in // bars x 4 laps  Side stepping on blue  foam balance beam x 5 lengths of the parallel bars  BLE staggered stance anterior/posterior weight shifting on  small rockerboard; CGA, increased difficulty and more unsteadiness with RLE fwd; min cues to increase weight shift over RLE BLE NBOS stance on airex pad with no UE and EC, 5x15 sec  Patient continues to demonstrates less incoordination of movement with select exercises . Patient responds well to verbal and tactile cues to correct form and technique. Muscle fatigue but no major pain complaints.                           PT Education - 02/11/16 0940    Education provided Yes   Education Details hep   Person(s) Educated Patient   Methods Explanation   Comprehension Verbalized understanding             PT Long Term Goals - 01/16/16 1022      PT LONG TERM GOAL #1   Title Patient will be independent in home exercise program to improve strength/mobility for better functional independence with ADLs.   Time 8   Period Weeks   Status New     PT LONG TERM GOAL #2   Title Patient (> 89 years old) will complete five times sit to stand test in < 15 seconds indicating an increased LE strength and improved balance.   Time 8   Period Weeks  Status New     PT LONG TERM GOAL #3   Title Patient will increase Berg Balance score by > 6 points to demonstrate decreased fall risk during functional activities.   Time 8   Period Weeks   Status New     PT LONG TERM GOAL #4   Title Patient will tolerate 5 seconds of single leg stance without loss of balance to improve ability to get in and out of shower safely.   Time 8   Period Weeks   Status New     PT LONG TERM GOAL #5   Title Patient will reduce timed up and go to <11 seconds to reduce fall risk and demonstrate improved transfer/gait ability.   Time 8   Period Weeks   Status New               Plan - 02/11/16 0947    Clinical Impression Statement Mod assist needed for mini dynadisk tapping to maintain stability. Min assist needed for stair tapping demonstrating occasional instances of heel catching on descent    Clinical Impairments Affecting Rehab Potential Patient wears AFO on RLE and has impaired flexibility in BLE hips   PT Frequency 2x / week   PT Duration 8 weeks   PT Treatment/Interventions Therapeutic exercise;Balance training;Neuromuscular re-education;Therapeutic activities   PT Next Visit Plan dynamic standing balance and strengthening   Consulted and Agree with Plan of Care Patient      Patient will benefit from skilled therapeutic intervention in order to improve the following deficits and impairments:  Abnormal gait, Decreased balance, Decreased mobility, Difficulty walking, Impaired flexibility, Decreased strength  Visit Diagnosis: Muscle weakness (generalized)  Difficulty in walking, not elsewhere classified     Problem List Patient Active Problem List   Diagnosis Date Noted  . BP (high blood pressure) 08/24/2015  . Hydronephrosis, left 07/28/2015  . UPJ (ureteropelvic junction) obstruction 07/28/2015  . Constriction of ureter (postoperative) 07/28/2015  . UTI (lower urinary tract infection) 07/14/2015  . Hydronephrosis 07/14/2015   Alanson Puls, PT, DPT [name] 02/11/2016, 9:51 AM  [place] MAIN [facility] SERVICES [address] [place], [place], [address] Phone: [phone]   Fax:  [phone]  Name: Abigail Hill MRN: [id number] Date of Birth: 1950-06-13

## 2016-02-13 ENCOUNTER — Encounter: Payer: Self-pay | Admitting: Physical Therapy

## 2016-02-13 ENCOUNTER — Ambulatory Visit: Payer: PPO | Admitting: Physical Therapy

## 2016-02-13 DIAGNOSIS — R262 Difficulty in walking, not elsewhere classified: Secondary | ICD-10-CM

## 2016-02-13 DIAGNOSIS — M6281 Muscle weakness (generalized): Secondary | ICD-10-CM

## 2016-02-13 NOTE — Therapy (Signed)
[place] MAIN [facility] SERVICES [address] [place], [place], [address] Phone: [phone]   Fax:  [phone]  Physical Therapy Treatment  Patient Details  Name: Abigail Hill MRN: [id number] Date of Birth: 20-May-1950 Referring Provider: Dr. Melrose Nakayama  Encounter Date: 02/13/2016      PT End of Session - 02/13/16 0931    Visit Number 8   Number of Visits 17   Date for PT Re-Evaluation 03/12/16   Authorization Type  8 g codes   PT Start Time 0915   PT Stop Time 1000   PT Time Calculation (min) 45 min   Equipment Utilized During Treatment Gait belt   Activity Tolerance Patient tolerated treatment well   Behavior During Therapy [facility] for tasks assessed/performed      Past Medical History:  Diagnosis Date  . Hypertension   . Recurrent UTI     Past Surgical History:  Procedure Laterality Date  . broken foot  2010/06/28  . kidney repair  1985   repaired torn blood vessel in kidney    There were no vitals filed for this visit.      Subjective Assessment - 02/13/16 0930    Subjective Patient reports that she feels unsteady and slow.    Patient is accompained by: Family member   Pertinent History surgery on right foot year ago, High blood pressure, uses Left AFO, lives with husband, has steps at front door   Limitations Standing;Walking;House hold activities   How long can you sit comfortably? unlimited   How long can you stand comfortably? 30 mins   How long can you walk comfortably? 15 mintues   Patient Stated Goals walk and be steady   Currently in Pain? No/denies   Multiple Pain Sites No        TREATMENT Therapeutic exercise; Nustep L3 x 5 minutes  BOSU ball lunges x 20 x 2 slde stepping on TM left and right with . 4 Miles / hour Tapping on steps x 20  Ascending/descending steps without railing  Side stepping with RTB x 4 lengths Quantum double leg press 100  Lbs x 15 x 3 Heel raises with UE support 2 x 10; Side stepping left  and right x 10 Patient needs occasional verbal cueing to improve posture and cueing to correctly perform exercises slowly, holding at end of range to increase motor firing of desired muscle to encourage fatigue.                           PT Education - 02/13/16 0930    Education provided Yes   Education Details LE strengthening   Person(s) Educated Patient   Methods Explanation   Comprehension Verbalized understanding             PT Long Term Goals - 01/16/16 1022      PT LONG TERM GOAL #1   Title Patient will be independent in home exercise program to improve strength/mobility for better functional independence with ADLs.   Time 8   Period Weeks   Status New     PT LONG TERM GOAL #2   Title Patient (> 77 years old) will complete five times sit to stand test in < 15 seconds indicating an increased LE strength and improved balance.   Time 8   Period Weeks   Status New     PT LONG TERM GOAL #3   Title Patient will increase Oceanographer  score by > 6 points to demonstrate decreased fall risk during functional activities.   Time 8   Period Weeks   Status New     PT LONG TERM GOAL #4   Title Patient will tolerate 5 seconds of single leg stance without loss of balance to improve ability to get in and out of shower safely.   Time 8   Period Weeks   Status New     PT LONG TERM GOAL #5   Title Patient will reduce timed up and go to <11 seconds to reduce fall risk and demonstrate improved transfer/gait ability.   Time 8   Period Weeks   Status New               Plan - 02/13/16 0931    Clinical Impression Statement Cueing was needed during the leg press to control motion out/back; frequent instances of decreased eccentric control were exhibited.Min cueing needed during hip 3-way to maintain upright posture with knees straight. Pt had good performance of therapeutic exercise today   Clinical Impairments Affecting Rehab Potential Patient wears AFO  on RLE and has impaired flexibility in BLE hips   PT Frequency 2x / week   PT Duration 8 weeks   PT Treatment/Interventions Therapeutic exercise;Balance training;Neuromuscular re-education;Therapeutic activities   PT Next Visit Plan dynamic standing balance and strengthening   Consulted and Agree with Plan of Care Patient      Patient will benefit from skilled therapeutic intervention in order to improve the following deficits and impairments:  Abnormal gait, Decreased balance, Decreased mobility, Difficulty walking, Impaired flexibility, Decreased strength  Visit Diagnosis: Muscle weakness (generalized)  Difficulty in walking, not elsewhere classified     Problem List Patient Active Problem List   Diagnosis Date Noted  . BP (high blood pressure) 08/24/2015  . Hydronephrosis, left 07/28/2015  . UPJ (ureteropelvic junction) obstruction 07/28/2015  . Constriction of ureter (postoperative) 07/28/2015  . UTI (lower urinary tract infection) 07/14/2015  . Hydronephrosis 07/14/2015    Abigail Hill 02/13/2016, 9:33 AM  [place] MAIN [facility] SERVICES [address] [place], [place], [address] Phone: [phone]   Fax:  [phone]  Name: Abigail Hill MRN: [id number] Date of Birth: [birth year]

## 2016-02-15 ENCOUNTER — Encounter: Payer: Self-pay | Admitting: Urology

## 2016-02-15 ENCOUNTER — Ambulatory Visit: Payer: PPO | Admitting: Urology

## 2016-02-15 VITALS — BP 163/78 | HR 85 | Ht 63.0 in | Wt 157.0 lb

## 2016-02-15 DIAGNOSIS — N135 Crossing vessel and stricture of ureter without hydronephrosis: Secondary | ICD-10-CM | POA: Diagnosis not present

## 2016-02-15 DIAGNOSIS — N1330 Unspecified hydronephrosis: Secondary | ICD-10-CM

## 2016-02-15 NOTE — Progress Notes (Signed)
02/15/2016 12:46 PM   [name] 06/28/1950 [id number]  Referring provider: Madelyn Brunner, MD [facility] [place], [place] [address]  Chief Complaint  Patient presents with  . Follow-up    39month    HPI: 66 year old female with a right UPJ obstruction status post repair 30 years ago who presents today for routine follow-up 6 months later.  She the initially presented to the emergency room on 06/13/2015 with a severe urinary tract infection, E. coli. As part of further workup, she underwent a CT stone protocol which revealed marked left hydronephrosis compatible with UPJ obstruction without an obstructing stone.  She was seen and evaluated in our office and underwent a Lasix renogram on 07/23/2015 which was consistent with left UPJ obstruction with decreased split function, 37.5% of of the left kidney.  She did have surgery on her left kidney 30 years ago by Dr. Redmond Baseman at which time it sounds like she had a UPJ repair (open pyeloplasty).  She has a flank incision on the left.    Since surgery, she's had no issues with flank pain, hematuria, stones or any other urinary issues. This was her first urinary tract infection in 10 years.  No further infections over this past 6 month interval.  Her creatinine remains at baseline, 1.0.  She remains asymptomatic. She did have a UA performed routinely by her PCP which was mildly suggestive of infection, however she has slowly no symptoms including no frequency, urgency, dysuria, or gross hematuria.  Since her last visit, she has been seen and evaluated by a neurologist. She was diagnosed with drop foot and is now wearing a brace and undergoing physical therapy. She is ambulating much better. She denies any significant leg pain.   PMH: Past Medical History:  Diagnosis Date  . Abnormal gait 12/18/2015  . BP (high blood pressure) 08/24/2015  . Constriction of ureter (postoperative) 07/28/2015  . Hydronephrosis  07/14/2015  . Hypertension   . Left foot drop 12/18/2015  . Recurrent UTI   . UPJ (ureteropelvic junction) obstruction 07/28/2015  . UTI (lower urinary tract infection) 07/14/2015    Surgical History: Past Surgical History:  Procedure Laterality Date  . broken foot  2012  . kidney repair  1985   repaired torn blood vessel in kidney    Home Medications:  Allergies as of 02/15/2016   No Known Allergies     Medication List       Accurate as of 02/15/16 12:46 PM. Always use your most recent med list.          CENTRUM SILVER PO Take by mouth.   OCUVITE EYE HEALTH FORMULA PO Take by mouth.   hydrochlorothiazide 12.5 MG tablet Commonly known as:  HYDRODIURIL Take by mouth.       Allergies: No Known Allergies  Family History: Family History  Problem Relation Age of Onset  . Kidney disease Neg Hx   . Bladder Cancer Neg Hx     Social History:  reports that she has quit smoking. She has never used smokeless tobacco. She reports that she drinks alcohol. She reports that she does not use drugs.  ROS: UROLOGY Frequent Urination?: No Hard to postpone urination?: No Burning/pain with urination?: No Get up at night to urinate?: Yes Leakage of urine?: No Urine stream starts and stops?: No Trouble starting stream?: No Do you have to strain to urinate?: No Blood in urine?: No Urinary tract infection?: Yes Sexually transmitted disease?: No  Injury to kidneys or bladder?: No Painful intercourse?: No Weak stream?: No Currently pregnant?: No Vaginal bleeding?: No Last menstrual period?: n  Gastrointestinal Nausea?: No Vomiting?: No Indigestion/heartburn?: No Diarrhea?: No Constipation?: No  Constitutional Fever: No Night sweats?: No Weight loss?: No Fatigue?: No  Skin Skin rash/lesions?: No Itching?: No  Eyes Blurred vision?: No Double vision?: No  Ears/Nose/Throat Sore throat?: No Sinus problems?: No  Hematologic/Lymphatic Swollen glands?:  No Easy bruising?: No  Cardiovascular Leg swelling?: No Chest pain?: No  Respiratory Cough?: No Shortness of breath?: No  Endocrine Excessive thirst?: No  Musculoskeletal Back pain?: No Joint pain?: No  Neurological Headaches?: No Dizziness?: No  Psychologic Depression?: No Anxiety?: No  Physical Exam: BP (!) 163/78   Pulse 85   Ht 5\' 3"  (1.6 m)   Wt 157 lb (71.2 kg)   BMI 27.81 kg/m   Constitutional:  Alert and oriented, No acute distress.   HEENT: [place] AT, moist mucus membranes.  Trachea midline, no masses. Cardiovascular: No clubbing, cyanosis, or edema. Respiratory: Normal respiratory effort, no increased work of breathing. GI: Abdomen is soft, nontender, nondistended, no abdominal masses GU: No CVA tenderness. Skin: No rashes, bruises or suspicious lesions. Neurologic: Grossly intact, no focal deficits, moving all 4 extremities.  Wearing left lower extremity brace. Psychiatric: Normal mood and affect.  Laboratory Data: Lab Results  Component Value Date   WBC 13.8 (H) 06/13/2015   HGB 13.4 06/13/2015   HCT 39.6 06/13/2015   MCV 92.0 06/13/2015   PLT 138 (L) 06/13/2015    Lab Results  Component Value Date   CREATININE 0.99 07/10/2015    Pertinent Imaging: No new interval imaging  Assessment & Plan:    1. UPJ (ureteropelvic junction) obstruction Chronic UPJ s/p repair 30 years ago with ongoing obstruction resulting in decreased left renal function based on lasix renogram 07/2015. She is otherwise completely symptomatic without recurrent UTIs, stones, flank pain (Dietel's crisis), or any other sequela.  Overall renal function preserved (stable) with normal functioning right kidney.    She would like to continue to avoid surgical intervention as possible. Given that she is asymptomatic, I feel this is reasonable. If unclear over what period of time she's had loss of renal function.  I have recommended repeating Lasix renogram in 6 months, approximate  one year from her first scan. If her overall differential function remained stable, will continue follow conservatively.  Signs and symptoms of urinary tract infection/pyelonephritis reviewed. She will return sooner if she develops any pain, infections, or changes in her renal function.  2. Hydronephrosis, left As above, chronic   Return in about 6 months (around 08/15/2016) for f/u with lasix renogram.  Hollice Espy, MD  [place] [address] [place],  [address] [phone]

## 2016-02-18 ENCOUNTER — Ambulatory Visit: Payer: PPO

## 2016-02-18 VITALS — BP 150/78 | HR 80

## 2016-02-18 DIAGNOSIS — M6281 Muscle weakness (generalized): Secondary | ICD-10-CM | POA: Diagnosis not present

## 2016-02-18 DIAGNOSIS — R262 Difficulty in walking, not elsewhere classified: Secondary | ICD-10-CM

## 2016-02-18 NOTE — Therapy (Signed)
[place] MAIN [facility] SERVICES [address] [place], [place], [address] Phone: [phone]   Fax:  [phone]  Physical Therapy Treatment  Patient Details  Name: Abigail Hill MRN: [id number] Date of Birth: 13-Nov-1950 Referring Provider: Dr. Melrose Nakayama  Encounter Date: 02/18/2016      PT End of Session - 02/18/16 0924    Visit Number 9   Number of Visits 17   Date for PT Re-Evaluation 03/12/16   Authorization Type  9 g codes   PT Start Time 0920   PT Stop Time 1000   PT Time Calculation (min) 40 min   Equipment Utilized During Treatment Gait belt   Activity Tolerance Patient tolerated treatment well   Behavior During Therapy [facility] for tasks assessed/performed      Past Medical History:  Diagnosis Date  . Abnormal gait 12/18/2015  . BP (high blood pressure) 08/24/2015  . Constriction of ureter (postoperative) 07/28/2015  . Hydronephrosis 07/14/2015  . Hypertension   . Left foot drop 12/18/2015  . Recurrent UTI   . UPJ (ureteropelvic junction) obstruction 07/28/2015  . UTI (lower urinary tract infection) 07/14/2015    Past Surgical History:  Procedure Laterality Date  . broken foot  06-19-2010  . kidney repair  1985   repaired torn blood vessel in kidney    Vitals:   02/18/16 0926  BP: (!) 150/78  Pulse: 80  SpO2: 100%        Subjective Assessment - 02/18/16 0923    Subjective Pt reports she is doing well on this date. Denies pain and no specific questions or concerns currently. Performs HEP without issue.    Patient is accompained by: Family member   Pertinent History surgery on right foot year ago, High blood pressure, uses Left AFO, lives with husband, has steps at front door   Limitations Standing;Walking;House hold activities   How long can you sit comfortably? unlimited   How long can you stand comfortably? 30 mins   How long can you walk comfortably? 15 mintues   Patient Stated Goals walk and be steady   Currently in Pain?  No/denies         TREATMENT  Therapeutic exercise Quantum double leg press 105# x 10, 120# x 10, 135# x 10; Matrix resisted gait forward, retro x 5 each, and lateral x 3 each direction all 12.5# tried 17.5# but too much; Sit to stand without UE support x 10;  Neuromuscular Re-education Airex tapping on stepping stone in 1 and 2 cone sequences as called out by therapist without UE support; Braiding in // bars without UE support x multiple laps with considerable    Patient needs occasional verbal cueing to improve posture and cueing to correctly perform exercises. Constant CGA to minA+1 for balance                          PT Education - 02/18/16 0924    Education provided Yes   Education Details HEP reinforced, add slow marching for SLS balance   Person(s) Educated Patient   Methods Explanation   Comprehension Verbalized understanding             PT Long Term Goals - 01/16/16 1022      PT LONG TERM GOAL #1   Title Patient will be independent in home exercise program to improve strength/mobility for better functional independence with ADLs.   Time 8   Period Weeks   Status  New     PT LONG TERM GOAL #2   Title Patient (> 95 years old) will complete five times sit to stand test in < 15 seconds indicating an increased LE strength and improved balance.   Time 8   Period Weeks   Status New     PT LONG TERM GOAL #3   Title Patient will increase Berg Balance score by > 6 points to demonstrate decreased fall risk during functional activities.   Time 8   Period Weeks   Status New     PT LONG TERM GOAL #4   Title Patient will tolerate 5 seconds of single leg stance without loss of balance to improve ability to get in and out of shower safely.   Time 8   Period Weeks   Status New     PT LONG TERM GOAL #5   Title Patient will reduce timed up and go to <11 seconds to reduce fall risk and demonstrate improved transfer/gait ability.   Time 8    Period Weeks   Status New               Plan - 02/18/16 0925    Clinical Impression Statement Pt is able to increase resistance with leg press on this date. She requires CGA to minA+1 for Matrix resisted gait due to instability. Pt encouraged to continue HEP and follow-up as scheduled.    Clinical Impairments Affecting Rehab Potential Patient wears AFO on RLE and has impaired flexibility in BLE hips   PT Frequency 2x / week   PT Duration 8 weeks   PT Treatment/Interventions Therapeutic exercise;Balance training;Neuromuscular re-education;Therapeutic activities   PT Next Visit Plan dynamic standing balance and strengthening   PT Home Exercise Plan Continue as prescribed   Consulted and Agree with Plan of Care Patient      Patient will benefit from skilled therapeutic intervention in order to improve the following deficits and impairments:  Abnormal gait, Decreased balance, Decreased mobility, Difficulty walking, Impaired flexibility, Decreased strength  Visit Diagnosis: Muscle weakness (generalized)  Difficulty in walking, not elsewhere classified     Problem List Patient Active Problem List   Diagnosis Date Noted  . Abnormal gait 12/18/2015  . Left foot drop 12/18/2015  . BP (high blood pressure) 08/24/2015  . Hydronephrosis, left 07/28/2015  . UPJ (ureteropelvic junction) obstruction 07/28/2015  . UTI (lower urinary tract infection) 07/14/2015  . Hydronephrosis 07/14/2015   Phillips Grout PT, DPT   Huprich,Jason 02/18/2016, 10:16 AM  [place] [facility] SERVICES [address] [place], [place], [address] Phone: [phone]   Fax:  [phone]  Name: Abigail Hill MRN: [id number] Date of Birth: Apr 09, 1950

## 2016-02-20 ENCOUNTER — Encounter: Payer: Self-pay | Admitting: Physical Therapy

## 2016-02-20 ENCOUNTER — Ambulatory Visit: Payer: PPO | Admitting: Physical Therapy

## 2016-02-20 VITALS — BP 146/79 | HR 79

## 2016-02-20 DIAGNOSIS — R262 Difficulty in walking, not elsewhere classified: Secondary | ICD-10-CM

## 2016-02-20 DIAGNOSIS — M810 Age-related osteoporosis without current pathological fracture: Secondary | ICD-10-CM | POA: Diagnosis not present

## 2016-02-20 DIAGNOSIS — M6281 Muscle weakness (generalized): Secondary | ICD-10-CM | POA: Diagnosis not present

## 2016-02-20 NOTE — Therapy (Signed)
[place] MAIN [facility] SERVICES [address] [place], [place], [address] Phone: [phone]   Fax:  [phone]  Physical Therapy Treatment  Patient Details  Name: Abigail Hill MRN: [id number] Date of Birth: 1950-09-14 Referring Provider: Dr. Melrose Nakayama  Encounter Date: 02/20/2016      PT End of Session - 02/20/16 0930    Visit Number 10   Number of Visits 17   Date for PT Re-Evaluation 03/12/16   Authorization Type 10 g codes   PT Start Time 0917   PT Stop Time 1002   PT Time Calculation (min) 45 min   Equipment Utilized During Treatment Gait belt   Activity Tolerance Patient tolerated treatment well   Behavior During Therapy [facility] for tasks assessed/performed      Past Medical History:  Diagnosis Date  . Abnormal gait 12/18/2015  . BP (high blood pressure) 08/24/2015  . Constriction of ureter (postoperative) 07/28/2015  . Hydronephrosis 07/14/2015  . Hypertension   . Left foot drop 12/18/2015  . Recurrent UTI   . UPJ (ureteropelvic junction) obstruction 07/28/2015  . UTI (lower urinary tract infection) 07/14/2015    Past Surgical History:  Procedure Laterality Date  . broken foot  19-May-2010  . kidney repair  1985   repaired torn blood vessel in kidney    Vitals:   02/20/16 0927  BP: (!) 146/79  Pulse: 79        Subjective Assessment - 02/20/16 0926    Subjective Pt reports completing her HEP 3x/day with no complaints or concerns.  Tries to do her exercises when her husband is home.  Pt is doing well today.   Patient is accompained by: Family member   Pertinent History surgery on right foot year ago, High blood pressure, uses Left AFO, lives with husband, has steps at front door   Limitations Standing;Walking;House hold activities   How long can you sit comfortably? unlimited   How long can you stand comfortably? 30 mins   How long can you walk comfortably? 15 mintues   Patient Stated Goals walk and be steady   Currently in Pain?  No/denies       TREATMENT   Outcome measures completed and results explained to the pt:  5xSTS: 13.40 sec  Berg: 50/56  SLS: 4 seconds on RLE  TUG: 12.11 sec   Thereapeutic Exercise:  Alternating toe taps up to 8" step without UE support, 2x20  Stepping up and over 8" step without UE support, x10 each side   Neuromuscular Re-ed:  SLS 2x30 seconds each side, cues for glute squeeze with improvement  Standing on airex with ball toss into basket ~10 ft away. x5 minutes  [place] on airex 2x30 seconds             [facility] Adult PT Treatment/Exercise - 02/20/16 0937      Balance   Balance Assessed Yes     Standardized Balance Assessment   Standardized Balance Assessment Berg Balance Test     Berg Balance Test   Sit to Stand Able to stand without using hands and stabilize independently   Standing Unsupported Able to stand safely 2 minutes   Sitting with Back Unsupported but Feet Supported on Floor or Stool Able to sit safely and securely 2 minutes   Stand to Sit Sits safely with minimal use of hands   Transfers Able to transfer safely, minor use of hands   Standing Unsupported with Eyes Closed Able to stand 10 seconds  safely   Standing Ubsupported with Feet Together Able to place feet together independently and stand 1 minute safely   From Standing, Reach Forward with Outstretched Arm Can reach confidently >25 cm (10")   From Standing Position, Pick up Object from [place] to pick up shoe safely and easily   From Standing Position, Turn to Look Behind Over each Shoulder Looks behind from both sides and weight shifts well   Turn 360 Degrees Able to turn 360 degrees safely one side only in 4 seconds or less   Standing Unsupported, Alternately Place Feet on Step/Stool Able to stand independently and safely and complete 8 steps in 20 seconds   Standing Unsupported, One Foot in Front Needs help to step but can hold 15 seconds   Standing on One Leg Able to lift leg independently  and hold equal to or more than 3 seconds   Total Score 50                PT Education - 02/20/16 0929    Education provided Yes   Education Details Exercise technique   Person(s) Educated Patient   Methods Explanation;Demonstration;Verbal cues;Tactile cues   Comprehension Verbalized understanding;Returned demonstration;Need further instruction             PT Long Term Goals - 02/20/16 0943      PT LONG TERM GOAL #1   Title Patient will be independent in home exercise program to improve strength/mobility for better functional independence with ADLs.   Time 8   Period Weeks   Status Partially Met     PT LONG TERM GOAL #2   Title Patient (> 67 years old) will complete five times sit to stand test in < 15 seconds indicating an increased LE strength and improved balance.   Baseline 13.30 on 12/20   Time 8   Period Weeks   Status On-going     PT LONG TERM GOAL #3   Title Patient will increase Berg Balance score by > 6 points to demonstrate decreased fall risk during functional activities.   Baseline 50/56 on 12/20, improved by 6 points   Time 8   Period Weeks   Status Partially Met     PT LONG TERM GOAL #4   Title Patient will tolerate 5 seconds of single leg stance without loss of balance to improve ability to get in and out of shower safely.   Baseline 4 seconds on RLE on 12/20   Time 8   Period Weeks   Status Partially Met     PT LONG TERM GOAL #5   Title Patient will reduce timed up and go to <11 seconds to reduce fall risk and demonstrate improved transfer/gait ability.   Baseline 17.64 sec on evaluation; 12.11 sec on 12/20   Time 8   Period Weeks   Status Partially Met               Plan - 02/20/16 0950    Clinical Impression Statement As demonstrated by her TUG and Berg Balance score pt has improved her balance since her initial evaluation.  She does, however, continue to demonstrate imbalance with SLS and tandem stance.  Her 5xSTS score has  not demonstrated an significant change and pt will benefit from continued skilled PT interventions with  focus on strengthening exercises in combination with balance exercises.    Clinical Impairments Affecting Rehab Potential Patient wears AFO on RLE and has impaired flexibility in BLE hips   PT  Frequency 2x / week   PT Duration 6 weeks   PT Treatment/Interventions Therapeutic exercise;Balance training;Neuromuscular re-education;Therapeutic activities   PT Next Visit Plan dynamic standing balance and strengthening   PT Home Exercise Plan Continue as prescribed   Consulted and Agree with Plan of Care Patient      Patient will benefit from skilled therapeutic intervention in order to improve the following deficits and impairments:  Abnormal gait, Decreased balance, Decreased mobility, Difficulty walking, Impaired flexibility, Decreased strength  Visit Diagnosis: Muscle weakness (generalized)  Difficulty in walking, not elsewhere classified       G-Codes - 03/14/2016 1012    Functional Assessment Tool Used TUG, Berg Balance Test, 5sSTS, SLS, clinical judgement   Functional Limitation Mobility: Walking and moving around   Mobility: Walking and Moving Around Current Status [phone]) At least 20 percent but less than 40 percent impaired, limited or restricted   Mobility: Walking and Moving Around Goal Status [phone]) At least 20 percent but less than 40 percent impaired, limited or restricted      Problem List Patient Active Problem List   Diagnosis Date Noted  . Abnormal gait 12/18/2015  . Left foot drop 12/18/2015  . BP (high blood pressure) 08/24/2015  . Hydronephrosis, left 07/28/2015  . UPJ (ureteropelvic junction) obstruction 07/28/2015  . UTI (lower urinary tract infection) 07/14/2015  . Hydronephrosis 07/14/2015    Collie Siad PT, DPT 03/14/2016, 10:13 AM  [place] MAIN [facility] SERVICES [address] [place], [place],  [address] Phone: [phone]   Fax:  [phone]  Name: Abigail Hill MRN: [id number] Date of Birth: 28-Nov-1950

## 2016-02-27 ENCOUNTER — Ambulatory Visit: Payer: PPO

## 2016-02-27 ENCOUNTER — Encounter: Payer: Self-pay | Admitting: Physical Therapy

## 2016-02-27 DIAGNOSIS — M6281 Muscle weakness (generalized): Secondary | ICD-10-CM

## 2016-02-27 DIAGNOSIS — R262 Difficulty in walking, not elsewhere classified: Secondary | ICD-10-CM

## 2016-02-27 NOTE — Therapy (Signed)
[place] MAIN [facility] SERVICES [address] [place], [place], [address] Phone: [phone]   Fax:  [phone]  Physical Therapy Treatment  Patient Details  Name: Abigail Hill MRN: [id number] Date of Birth: 1950/12/03 Referring Provider: Dr. Melrose Nakayama  Encounter Date: 02/27/2016      PT End of Session - 02/27/16 0858    Visit Number 11   Number of Visits 17   Date for PT Re-Evaluation 03/12/16   Authorization Type 11 g codes   PT Start Time 8756   PT Stop Time 0930   PT Time Calculation (min) 35 min   Equipment Utilized During Treatment Gait belt   Activity Tolerance Patient tolerated treatment well   Behavior During Therapy [facility] for tasks assessed/performed      Past Medical History:  Diagnosis Date  . Abnormal gait 12/18/2015  . BP (high blood pressure) 08/24/2015  . Constriction of ureter (postoperative) 07/28/2015  . Hydronephrosis 07/14/2015  . Hypertension   . Left foot drop 12/18/2015  . Recurrent UTI   . UPJ (ureteropelvic junction) obstruction 07/28/2015  . UTI (lower urinary tract infection) 07/14/2015    Past Surgical History:  Procedure Laterality Date  . broken foot  2012  . kidney repair  1985   repaired torn blood vessel in kidney    There were no vitals filed for this visit.      Subjective Assessment - 02/27/16 0853    Subjective Pt states that she has not performed her HEP since her last therapy session. She states that she has been really busy since her last appointment. Denies pain at this time. No specific questions or concerns currently.    Patient is accompained by: Family member   Pertinent History surgery on right foot year ago, High blood pressure, uses Left AFO, lives with husband, has steps at front door   Limitations Standing;Walking;House hold activities   How long can you sit comfortably? unlimited   How long can you stand comfortably? 30 mins   How long can you walk comfortably? 15 mintues   Patient Stated Goals walk and be steady   Currently in Pain? No/denies          TREATMENT  Therapeutic exercise NuStep L2 x 5 minutes during history (3 minutes unbilled); Quantum double leg press 120# x 10, 135# x 10, 150# x 10; Sit to stand without UE support and Airex under feet, 2kg overhead ball press with head/eye follow 2 x 10;  Neuromuscular Re-education Agility ladder gait practicing increased step length bilaterally to improve gait and challenge single leg balance; Airex tapping on stepping stone in 1 and 2 cone sequences as called out by therapist without UE support; Airex feet together balance without UE support 30s x 2; Airex feet together balance with lateral ball tosses with therapist with head/eye follow x 1 minute each direction;  Patient needs occasional verbal cueing to improve posture and cueing to correctly perform exercises. Constant CGA to minA+1 for balance                        PT Education - 02/27/16 0858    Education provided Yes   Education Details HEP reinforced, exercise form/technique during session   Person(s) Educated Patient   Methods Explanation   Comprehension Verbalized understanding             PT Long Term Goals - 02/20/16 0943      PT LONG TERM GOAL #  1   Title Patient will be independent in home exercise program to improve strength/mobility for better functional independence with ADLs.   Time 8   Period Weeks   Status Partially Met     PT LONG TERM GOAL #2   Title Patient (> 67 years old) will complete five times sit to stand test in < 15 seconds indicating an increased LE strength and improved balance.   Baseline 13.30 on 12/20   Time 8   Period Weeks   Status On-going     PT LONG TERM GOAL #3   Title Patient will increase Berg Balance score by > 6 points to demonstrate decreased fall risk during functional activities.   Baseline 50/56 on 12/20, improved by 6 points   Time 8   Period Weeks    Status Partially Met     PT LONG TERM GOAL #4   Title Patient will tolerate 5 seconds of single leg stance without loss of balance to improve ability to get in and out of shower safely.   Baseline 4 seconds on RLE on 12/20   Time 8   Period Weeks   Status Partially Met     PT LONG TERM GOAL #5   Title Patient will reduce timed up and go to <11 seconds to reduce fall risk and demonstrate improved transfer/gait ability.   Baseline 17.64 sec on evaluation; 12.11 sec on 12/20   Time 8   Period Weeks   Status Partially Met               Plan - 02/27/16 0858    Clinical Impression Statement Pt was able to successfully increase resistance today with quantum leg press. She demonstrates difficulty with lateral head turns while standing on Airex when performing ball toss. She also struggle with single leg balance when performing toe taps on Airex. Pt encouraged to continue HEP and follow-up as scheduled.    Rehab Potential Good   Clinical Impairments Affecting Rehab Potential Patient wears AFO on RLE and has impaired flexibility in BLE hips   PT Frequency 2x / week   PT Duration 6 weeks   PT Treatment/Interventions Therapeutic exercise;Balance training;Neuromuscular re-education;Therapeutic activities   PT Next Visit Plan dynamic standing balance and strengthening   PT Home Exercise Plan Continue as prescribed   Consulted and Agree with Plan of Care Patient      Patient will benefit from skilled therapeutic intervention in order to improve the following deficits and impairments:  Abnormal gait, Decreased balance, Decreased mobility, Difficulty walking, Impaired flexibility, Decreased strength  Visit Diagnosis: Muscle weakness (generalized)  Difficulty in walking, not elsewhere classified     Problem List Patient Active Problem List   Diagnosis Date Noted  . Abnormal gait 12/18/2015  . Left foot drop 12/18/2015  . BP (high blood pressure) 08/24/2015  . Hydronephrosis, left  07/28/2015  . UPJ (ureteropelvic junction) obstruction 07/28/2015  . UTI (lower urinary tract infection) 07/14/2015  . Hydronephrosis 07/14/2015   Phillips Grout PT, DPT   Haide Klinker 02/27/2016, 10:15 AM  [place] MAIN [facility] SERVICES [address] [place], [place], [address] Phone: [phone]   Fax:  [phone]  Name: Abigail Hill MRN: [id number] Date of Birth: 12-Jan-1951

## 2016-03-04 ENCOUNTER — Encounter: Payer: PPO | Admitting: Physical Therapy

## 2016-03-04 DIAGNOSIS — M810 Age-related osteoporosis without current pathological fracture: Secondary | ICD-10-CM | POA: Diagnosis not present

## 2016-03-06 ENCOUNTER — Encounter: Payer: PPO | Admitting: Physical Therapy

## 2016-03-11 ENCOUNTER — Encounter: Payer: Self-pay | Admitting: Physical Therapy

## 2016-03-11 ENCOUNTER — Ambulatory Visit
Admission: RE | Admit: 2016-03-11 | Discharge: 2016-03-11 | Disposition: A | Discharge status: Home / Self Care | Payer: PPO | Source: Ambulatory Visit | Attending: Internal Medicine | Admitting: Internal Medicine

## 2016-03-11 ENCOUNTER — Ambulatory Visit: Payer: PPO | Attending: Neurology | Admitting: Physical Therapy

## 2016-03-11 DIAGNOSIS — M6281 Muscle weakness (generalized): Secondary | ICD-10-CM | POA: Insufficient documentation

## 2016-03-11 DIAGNOSIS — Z1231 Encounter for screening mammogram for malignant neoplasm of breast: Secondary | ICD-10-CM | POA: Diagnosis not present

## 2016-03-11 DIAGNOSIS — R262 Difficulty in walking, not elsewhere classified: Secondary | ICD-10-CM | POA: Insufficient documentation

## 2016-03-11 HISTORY — DX: Malignant (primary) neoplasm, unspecified: C80.1

## 2016-03-11 NOTE — Therapy (Addendum)
[place] MAIN [facility] SERVICES [place], [place], [address] Phone: [phone]   Fax:  [phone]  Physical Therapy Treatment/Recertification treatment  Patient Details  Name: Abigail Hill MRN: [id number] Date of Birth: [birth year] Referring Provider: Dr. Melrose Nakayama  Encounter Date: 03/11/2016      PT End of Session - 03/11/16 1106    Visit Number 12   Number of Visits 33   Date for PT Re-Evaluation 05/06/16   Authorization Type 12 g codes   PT Start Time Jun 02, 1043   PT Stop Time 1130   PT Time Calculation (min) 45 min   Equipment Utilized During Treatment Gait belt   Activity Tolerance Patient tolerated treatment well;Patient limited by fatigue   Behavior During Therapy [facility] for tasks assessed/performed      Past Medical History:  Diagnosis Date  . Abnormal gait 12/18/2015  . BP (high blood pressure) 08/24/2015  . Cancer ([place])    skin  . Constriction of ureter (postoperative) 07/28/2015  . Hydronephrosis 07/14/2015  . Hypertension   . Left foot drop 12/18/2015  . Recurrent UTI   . UPJ (ureteropelvic junction) obstruction 07/28/2015  . UTI (lower urinary tract infection) 07/14/2015    Past Surgical History:  Procedure Laterality Date  . broken foot  2010-06-02  . kidney repair  1985   repaired torn blood vessel in kidney    There were no vitals filed for this visit.      Subjective Assessment - 03/11/16 1057    Subjective Pt states she has not performed her HEP because she lost the papers. States she is no pain at this time.   Currently in Pain? No/denies   Multiple Pain Sites No      Therapeutic exercise:  TM walking at 1.0 for 90mn; Patient demonstrates wide gait and shuffles both feet Matrix stepping forward and backward x5 CGA Matrix stepping sideways x5 CGA; patient used scapular stabilization muscles during side stepping Outcome measures: 5x sit to stand: 12.01s TUG: 14.78s  152m.[id number]: [id number] outcome  measures: OUTCOME MEASURES: TEST Outcome Interpretation  5 times sit<>stand 13.41sec >68 yo, >15 sec indicates increased risk for falls  10 meter walk test .64                m/s <1.0 m/s indicates increased risk for falls; limited community ambulator  Timed up and Go      17.64           sec <14 sec indicates increased risk for falls      Patient needs contact guard for dynamic standing balance activities and postural correction to look forward. Needs constant cuing to pick up her feet during gait due to decreased step height.                               PT Long Term Goals - 03/11/16 1135      PT LONG TERM GOAL #1   Title Patient will be independent in home exercise program to improve strength/mobility for better functional independence with ADLs.   Time 8   Status On-going     PT LONG TERM GOAL #2   Title Patient (> 60 y10rs old) will complete five times sit to stand test in < 15 seconds indicating an increased LE strength and improved balance.   Baseline 12.01 sec   Time 8   Period Weeks   Status  Partially Met     PT LONG TERM GOAL #3   Title Patient will increase Berg Balance score by > 6 points to demonstrate decreased fall risk during functional activities.   Time 8   Status On-going     PT LONG TERM GOAL #4   Title Patient will tolerate 5 seconds of single leg stance without loss of balance to improve ability to get in and out of shower safely.   Baseline 1 sec   Time 8   Period Weeks   Status Partially Met     PT LONG TERM GOAL #5   Title Patient will reduce timed up and go to <11 seconds to reduce fall risk and demonstrate improved transfer/gait ability.   Baseline 14.78 sec   Time 8   Status Partially Met               Plan - 03/20/16 1147    Clinical Impression Statement Patient performed outcome measures with improving falls risk and gait speed. She has decreased step height and  has a lateral weight shift during gait. She  has decreased dynamic standing balance and decreased BLE strength and will continue to benefit from skilled PT to improve mobiity and decrease her falls risk.    Rehab Potential Good   Clinical Impairments Affecting Rehab Potential Patient wears AFO on RLE and has impaired flexibility in BLE hips   PT Frequency 2x / week   PT Duration 8 weeks   PT Treatment/Interventions Therapeutic exercise;Balance training;Neuromuscular re-education;Therapeutic activities   PT Next Visit Plan dynamic standing balance and strengthening   PT Home Exercise Plan Continue as prescribed   Consulted and Agree with Plan of Care Patient      Patient will benefit from skilled therapeutic intervention in order to improve the following deficits and impairments:  Abnormal gait, Decreased balance, Decreased mobility, Difficulty walking, Impaired flexibility, Decreased strength  Visit Diagnosis: Muscle weakness (generalized)  Difficulty in walking, not elsewhere classified       G-Codes - 2016/03/20 1142    Functional Assessment Tool Used TUG, Berg Balance Test, 5sSTS, SLS, clinical judgement   Functional Limitation Mobility: Walking and moving around   Mobility: Walking and Moving Around Current Status [phone]) At least 20 percent but less than 40 percent impaired, limited or restricted   Mobility: Walking and Moving Around Goal Status [phone]) At least 1 percent but less than 20 percent impaired, limited or restricted      Problem List Patient Active Problem List   Diagnosis Date Noted  . Abnormal gait 12/18/2015  . Left foot drop 12/18/2015  . BP (high blood pressure) 08/24/2015  . Hydronephrosis, left 07/28/2015  . UPJ (ureteropelvic junction) obstruction 07/28/2015  . UTI (lower urinary tract infection) 07/14/2015  . Hydronephrosis 07/14/2015   Alanson Puls, PT, DPT [name] 03-20-16, 11:50 AM  [place] MAIN [facility] SERVICES [address]  [place], [place], [address] Phone: [phone]   Fax:  [phone]  Name: YOSELIN AMERMAN MRN: [id number] Date of Birth: 1950-11-26

## 2016-03-13 ENCOUNTER — Other Ambulatory Visit: Payer: Self-pay | Admitting: Internal Medicine

## 2016-03-13 ENCOUNTER — Encounter: Payer: Self-pay | Admitting: Physical Therapy

## 2016-03-13 ENCOUNTER — Ambulatory Visit: Payer: PPO | Admitting: Physical Therapy

## 2016-03-13 DIAGNOSIS — R928 Other abnormal and inconclusive findings on diagnostic imaging of breast: Secondary | ICD-10-CM

## 2016-03-13 DIAGNOSIS — R262 Difficulty in walking, not elsewhere classified: Secondary | ICD-10-CM

## 2016-03-13 DIAGNOSIS — M6281 Muscle weakness (generalized): Secondary | ICD-10-CM | POA: Diagnosis not present

## 2016-03-13 DIAGNOSIS — R921 Mammographic calcification found on diagnostic imaging of breast: Secondary | ICD-10-CM

## 2016-03-13 NOTE — Therapy (Addendum)
[place] MAIN [facility] SERVICES [address] [place], [place], [address] Phone: [phone]   Fax:  [phone]  Physical Therapy Treatment  Patient Details  Name: Abigail Hill MRN: [id number] Date of Birth: [birth year] Referring Provider: Dr. Melrose Nakayama  Encounter Date: 03/13/2016      PT End of Session - 03/13/16 1453    Visit Number 13   Number of Visits 33   Date for PT Re-Evaluation 05/06/16   Authorization Type 13 g codes   PT Start Time 0240   PT Stop Time 0320   PT Time Calculation (min) 40 min   Equipment Utilized During Treatment Gait belt   Activity Tolerance Patient tolerated treatment well;Patient limited by fatigue   Behavior During Therapy [facility] for tasks assessed/performed      Past Medical History:  Diagnosis Date  . Abnormal gait 12/18/2015  . BP (high blood pressure) 08/24/2015  . Cancer ([place])    skin  . Constriction of ureter (postoperative) 07/28/2015  . Hydronephrosis 07/14/2015  . Hypertension   . Left foot drop 12/18/2015  . Recurrent UTI   . UPJ (ureteropelvic junction) obstruction 07/28/2015  . UTI (lower urinary tract infection) 07/14/2015    Past Surgical History:  Procedure Laterality Date  . broken foot  2012  . kidney repair  1985   repaired torn blood vessel in kidney    There were no vitals filed for this visit.      Subjective Assessment - 03/13/16 1451    Subjective Pt states she is feeling unsteady and knows that she walks slowly. She is afriad of some of the balance exercises .   Patient is accompained by: Family member   Pertinent History surgery on right foot year ago, High blood pressure, uses Left AFO, lives with husband, has steps at front door   Limitations Standing;Walking;House hold activities   How long can you sit comfortably? unlimited   How long can you stand comfortably? 30 mins   How long can you walk comfortably? 15 mintues   Patient Stated Goals walk and be steady   Currently  in Pain? No/denies   Multiple Pain Sites No     Neuromuscular training ;  Matrix bwd/fwd x 5, side stepping x 3 left and right with CGA Step ups from blue foam to 6 inch stool. Patient was able to perform 15 with UE support and 5 without UE support.  Patient demonstrates fear of falling by overuse of UE support.. Patient needs CGA for all balance training.                            PT Education - 03/13/16 1452    Education provided Yes   Education Details HEP progressed   Person(s) Educated Patient   Methods Explanation   Comprehension Verbalized understanding             PT Long Term Goals - 03/11/16 1135      PT LONG TERM GOAL #1   Title Patient will be independent in home exercise program to improve strength/mobility for better functional independence with ADLs.   Time 8   Status On-going     PT LONG TERM GOAL #2   Title Patient (> 45 years old) will complete five times sit to stand test in < 15 seconds indicating an increased LE strength and improved balance.   Baseline 12.01 sec   Time 8   Period Weeks  Status Partially Met     PT LONG TERM GOAL #3   Title Patient will increase Berg Balance score by > 6 points to demonstrate decreased fall risk during functional activities.   Time 8   Status On-going     PT LONG TERM GOAL #4   Title Patient will tolerate 5 seconds of single leg stance without loss of balance to improve ability to get in and out of shower safely.   Baseline 1 sec   Time 8   Period Weeks   Status Partially Met     PT LONG TERM GOAL #5   Title Patient will reduce timed up and go to <11 seconds to reduce fall risk and demonstrate improved transfer/gait ability.   Baseline 14.78 sec   Time 8   Status Partially Met               Plan - 03/13/16 1454    Clinical Impression Statement Patient has dynamic and static standing balance deficits and has decreased step length and height and slow gait speed. She needs  CGA for all single leg activities and with decreased somatosensory input.    Rehab Potential Good   Clinical Impairments Affecting Rehab Potential Patient wears AFO on RLE and has impaired flexibility in BLE hips   PT Frequency 2x / week   PT Duration 8 weeks   PT Treatment/Interventions Therapeutic exercise;Balance training;Neuromuscular re-education;Therapeutic activities   PT Next Visit Plan dynamic standing balance and strengthening   PT Home Exercise Plan Continue as prescribed   Consulted and Agree with Plan of Care Patient      Patient will benefit from skilled therapeutic intervention in order to improve the following deficits and impairments:  Abnormal gait, Decreased balance, Decreased mobility, Difficulty walking, Impaired flexibility, Decreased strength  Visit Diagnosis: Muscle weakness (generalized)  Difficulty in walking, not elsewhere classified     Problem List Patient Active Problem List   Diagnosis Date Noted  . Abnormal gait 12/18/2015  . Left foot drop 12/18/2015  . BP (high blood pressure) 08/24/2015  . Hydronephrosis, left 07/28/2015  . UPJ (ureteropelvic junction) obstruction 07/28/2015  . UTI (lower urinary tract infection) 07/14/2015  . Hydronephrosis 07/14/2015   Alanson Puls, PT, DPT [place], Minette Headland S 03/13/2016, 3:13 PM  [place] MAIN [facility] SERVICES [address] [place], [place], [address] Phone: [phone]   Fax:  [phone]  Name: Abigail Hill MRN: [id number] Date of Birth: 02-01-1951

## 2016-03-18 ENCOUNTER — Ambulatory Visit: Payer: PPO | Admitting: Physical Therapy

## 2016-03-18 ENCOUNTER — Encounter: Payer: Self-pay | Admitting: Physical Therapy

## 2016-03-18 DIAGNOSIS — R262 Difficulty in walking, not elsewhere classified: Secondary | ICD-10-CM

## 2016-03-18 DIAGNOSIS — M6281 Muscle weakness (generalized): Secondary | ICD-10-CM

## 2016-03-18 NOTE — Therapy (Addendum)
[place] MAIN [facility] SERVICES [address] [place], [place], [address] Phone: [phone]   Fax:  [phone]  Physical Therapy Treatment  Patient Details  Name: Abigail Hill MRN: [id number] Date of Birth: June 13, 1950 Referring Provider: Dr. Melrose Nakayama  Encounter Date: 03/18/2016      PT End of Session - 03/18/16 1058    Visit Number 14   Number of Visits 33   Date for PT Re-Evaluation 05/06/16   Authorization Type 13 g codes   PT Start Time 5188   PT Stop Time 1130   PT Time Calculation (min) 45 min   Equipment Utilized During Treatment Gait belt   Activity Tolerance Patient tolerated treatment well;Patient limited by fatigue   Behavior During Therapy [facility] for tasks assessed/performed      Past Medical History:  Diagnosis Date  . Abnormal gait 12/18/2015  . BP (high blood pressure) 08/24/2015  . Cancer ([place])    skin  . Constriction of ureter (postoperative) 07/28/2015  . Hydronephrosis 07/14/2015  . Hypertension   . Left foot drop 12/18/2015  . Recurrent UTI   . UPJ (ureteropelvic junction) obstruction 07/28/2015  . UTI (lower urinary tract infection) 07/14/2015    Past Surgical History:  Procedure Laterality Date  . broken foot  2012  . kidney repair  1985   repaired torn blood vessel in kidney    There were no vitals filed for this visit.      Subjective Assessment - 03/18/16 1057    Subjective Pt states she is feeling unsteady and knows that she walks slowly. She is afriad of some of the balance exercises. She is taking a new medication that is giving her diarrhea.    Patient is accompained by: Family member   Pertinent History surgery on right foot year ago, High blood pressure, uses Left AFO, lives with husband, has steps at front door   Limitations Standing;Walking;House hold activities   How long can you sit comfortably? unlimited   How long can you stand comfortably? 30 mins   How long can you walk comfortably? 15  mintues   Patient Stated Goals walk and be steady   Currently in Pain? No/denies   Multiple Pain Sites No      TM walking side to side with elevation of 2  with . 3 miles/ hour left and right for 3 minutes each.  Standing on blue foam with feet together and modified tandem without UE support Standing on blue foam RLe and disk LLE tandem x 2 minutes Standing on blue foam and 12/ foam tandem x 2 mins Rocker board fwd/bwd and side to side x 20 each direction.   Patient has decreased static and dynamic standing balance and has multiple loss of balance posteriorly and needs UE assist throughout treatment and CGA for balance recovery.                         PT Education - 03/18/16 1057    Education provided Yes   Education Details HEP review and saftey with stepping   Person(s) Educated Patient   Methods Explanation   Comprehension Verbalized understanding             PT Long Term Goals - 03/11/16 1135      PT LONG TERM GOAL #1   Title Patient will be independent in home exercise program to improve strength/mobility for better functional independence with ADLs.   Time 8  Status On-going     PT LONG TERM GOAL #2   Title Patient (> 35 years old) will complete five times sit to stand test in < 15 seconds indicating an increased LE strength and improved balance.   Baseline 12.01 sec   Time 8   Period Weeks   Status Partially Met     PT LONG TERM GOAL #3   Title Patient will increase Berg Balance score by > 6 points to demonstrate decreased fall risk during functional activities.   Time 8   Status On-going     PT LONG TERM GOAL #4   Title Patient will tolerate 5 seconds of single leg stance without loss of balance to improve ability to get in and out of shower safely.   Baseline 1 sec   Time 8   Period Weeks   Status Partially Met     PT LONG TERM GOAL #5   Title Patient will reduce timed up and go to <11 seconds to reduce fall risk and demonstrate  improved transfer/gait ability.   Baseline 14.78 sec   Time 8   Status Partially Met               Plan - 03/18/16 1133    Clinical Impression Statement Patient has decreased stataic and dynamic standing balance especially when challenging somatosensory system and with weight shifting activiites.    Rehab Potential Good   Clinical Impairments Affecting Rehab Potential Patient wears AFO on RLE and has impaired flexibility in BLE hips   PT Frequency 2x / week   PT Duration 8 weeks   PT Treatment/Interventions Therapeutic exercise;Balance training;Neuromuscular re-education;Therapeutic activities   PT Next Visit Plan dynamic standing balance and strengthening   PT Home Exercise Plan Continue as prescribed   Consulted and Agree with Plan of Care Patient      Patient will benefit from skilled therapeutic intervention in order to improve the following deficits and impairments:  Abnormal gait, Decreased balance, Decreased mobility, Difficulty walking, Impaired flexibility, Decreased strength  Visit Diagnosis: Muscle weakness (generalized)  Difficulty in walking, not elsewhere classified     Problem List Patient Active Problem List   Diagnosis Date Noted  . Abnormal gait 12/18/2015  . Left foot drop 12/18/2015  . BP (high blood pressure) 08/24/2015  . Hydronephrosis, left 07/28/2015  . UPJ (ureteropelvic junction) obstruction 07/28/2015  . UTI (lower urinary tract infection) 07/14/2015  . Hydronephrosis 07/14/2015   Alanson Puls, PT, DPT [name] 03/18/2016, 11:54 AM  [place] MAIN [facility] SERVICES [address] [place], [place], [address] Phone: [phone]   Fax:  [phone]  Name: Abigail Hill MRN: [id number] Date of Birth: [birth year]

## 2016-03-20 ENCOUNTER — Ambulatory Visit: Payer: PPO | Admitting: Physical Therapy

## 2016-03-24 ENCOUNTER — Encounter: Payer: Self-pay | Admitting: Physical Therapy

## 2016-03-24 ENCOUNTER — Ambulatory Visit: Payer: PPO | Admitting: Physical Therapy

## 2016-03-24 DIAGNOSIS — M6281 Muscle weakness (generalized): Secondary | ICD-10-CM

## 2016-03-24 DIAGNOSIS — R262 Difficulty in walking, not elsewhere classified: Secondary | ICD-10-CM

## 2016-03-24 NOTE — Therapy (Signed)
[place] MAIN [facility] SERVICES [address] [place], [place], [address] Phone: [phone]   Fax:  [phone]  Physical Therapy Treatment  Patient Details  Name: Abigail Hill MRN: [id number] Date of Birth: 1950/06/16 Referring Provider: Dr. Melrose Nakayama  Encounter Date: 03/24/2016      PT End of Session - 03/24/16 0937    Visit Number 15   Number of Visits 33   Date for PT Re-Evaluation 05/06/16   Authorization Type 15 g codes   PT Start Time 0930   PT Stop Time 1000   PT Time Calculation (min) 30 min   Equipment Utilized During Treatment Gait belt   Activity Tolerance Patient tolerated treatment well;Patient limited by fatigue   Behavior During Therapy [facility] for tasks assessed/performed      Past Medical History:  Diagnosis Date  . Abnormal gait 12/18/2015  . BP (high blood pressure) 08/24/2015  . Cancer ([place])    skin  . Constriction of ureter (postoperative) 07/28/2015  . Hydronephrosis 07/14/2015  . Hypertension   . Left foot drop 12/18/2015  . Recurrent UTI   . UPJ (ureteropelvic junction) obstruction 07/28/2015  . UTI (lower urinary tract infection) 07/14/2015    Past Surgical History:  Procedure Laterality Date  . broken foot  2012  . kidney repair  1985   repaired torn blood vessel in kidney    There were no vitals filed for this visit.      Subjective Assessment - 03/24/16 0933    Subjective Pt states she is doing okay today and did not get out of the house last week due to the snow and ice. She goes back to neurologist for follow up on February 12th.   Patient is accompained by: --   Pertinent History surgery on right foot year ago, High blood pressure, uses Left AFO, lives with husband, has steps at front door   Limitations Standing;Walking;House hold activities   How long can you sit comfortably? unlimited   How long can you stand comfortably? 30 mins   How long can you walk comfortably? 15 mintues   Patient Stated  Goals walk and be steady   Currently in Pain? No/denies   Multiple Pain Sites No      NMR: TM walking forward 1.3 mph TM walking side to side .3 mph Blue foam step ups to 6 inch stool x20 Blue foam toe taps to 6 inch stool x20 Blue foam side step ups to left x15 Blue foam side step ups to right x15   Patient needed consistent cueing to not use UE support during all blue foam activities. Patient demonstrates severe fear of falling during all static and dynamic balance activities. Patient needed CGA and close supervision during all balance training activities. She great difficulty with single leg activities.        PT Education - 03/24/16 0935    Education provided Yes   Education Details continuance of HEP   Person(s) Educated Patient   Methods Explanation   Comprehension Verbalized understanding             PT Long Term Goals - 03/11/16 1135      PT LONG TERM GOAL #1   Title Patient will be independent in home exercise program to improve strength/mobility for better functional independence with ADLs.   Time 8   Status On-going     PT LONG TERM GOAL #2   Title Patient (> 55 years old) will complete five times  sit to stand test in < 15 seconds indicating an increased LE strength and improved balance.   Baseline 12.01 sec   Time 8   Period Weeks   Status Partially Met     PT LONG TERM GOAL #3   Title Patient will increase Berg Balance score by > 6 points to demonstrate decreased fall risk during functional activities.   Time 8   Status On-going     PT LONG TERM GOAL #4   Title Patient will tolerate 5 seconds of single leg stance without loss of balance to improve ability to get in and out of shower safely.   Baseline 1 sec   Time 8   Period Weeks   Status Partially Met     PT LONG TERM GOAL #5   Title Patient will reduce timed up and go to <11 seconds to reduce fall risk and demonstrate improved transfer/gait ability.   Baseline 14.78 sec   Time 8   Status  Partially Met               Plan - 03/24/16 1015    Clinical Impression Statement Patient demonstrates decreased dynamic standing balance and severe fear of falling. Patient will continue to benefit from skilled therapy in order to improve dynamic standing balance and decrease fear of falling.   Rehab Potential Good   Clinical Impairments Affecting Rehab Potential Patient wears AFO on RLE and has impaired flexibility in BLE hips   PT Frequency 2x / week   PT Duration 8 weeks   PT Treatment/Interventions Therapeutic exercise;Balance training;Neuromuscular re-education;Therapeutic activities   PT Next Visit Plan dynamic standing balance and strengthening   PT Home Exercise Plan Continue as prescribed   Consulted and Agree with Plan of Care Patient      Patient will benefit from skilled therapeutic intervention in order to improve the following deficits and impairments:  Abnormal gait, Decreased balance, Decreased mobility, Difficulty walking, Impaired flexibility, Decreased strength  Visit Diagnosis: Muscle weakness (generalized)  Difficulty in walking, not elsewhere classified     Problem List Patient Active Problem List   Diagnosis Date Noted  . Abnormal gait 12/18/2015  . Left foot drop 12/18/2015  . BP (high blood pressure) 08/24/2015  . Hydronephrosis, left 07/28/2015  . UPJ (ureteropelvic junction) obstruction 07/28/2015  . UTI (lower urinary tract infection) 07/14/2015  . Hydronephrosis 07/14/2015  Alanson Puls, PT, DPT This entire session was performed under direct supervision and direction of a licensed therapist/therapist assistant . I have personally read, edited and approve of the note as written. Betsy Coder, SPT [place] 03/24/2016, 10:18 AM  [place] MAIN [facility] SERVICES [address] [place], [place], [address] Phone: [phone]   Fax:  [phone]  Name: Abigail Hill MRN: [id number] Date of  Birth: Jan 02, 1951

## 2016-03-26 ENCOUNTER — Ambulatory Visit: Payer: PPO | Admitting: Physical Therapy

## 2016-03-26 ENCOUNTER — Encounter: Payer: Self-pay | Admitting: Physical Therapy

## 2016-03-26 DIAGNOSIS — M6281 Muscle weakness (generalized): Secondary | ICD-10-CM | POA: Diagnosis not present

## 2016-03-26 DIAGNOSIS — R262 Difficulty in walking, not elsewhere classified: Secondary | ICD-10-CM

## 2016-03-26 NOTE — Therapy (Signed)
[place] MAIN [facility] SERVICES [address] [place], [place], [address] Phone: [phone]   Fax:  [phone]  Physical Therapy Treatment  Patient Details  Name: Abigail Hill MRN: [id number] Date of Birth: 1951-01-10 Referring Provider: Dr. Melrose Nakayama  Encounter Date: 03/26/2016      PT End of Session - 03/26/16 0928    Visit Number 16   Number of Visits 33   Date for PT Re-Evaluation 05/06/16   Authorization Type 16 g codes   PT Start Time 0920   PT Stop Time 1000   PT Time Calculation (min) 40 min   Equipment Utilized During Treatment Gait belt   Activity Tolerance Patient tolerated treatment well;Patient limited by fatigue   Behavior During Therapy [facility] for tasks assessed/performed      Past Medical History:  Diagnosis Date  . Abnormal gait 12/18/2015  . BP (high blood pressure) 08/24/2015  . Cancer ([place])    skin  . Constriction of ureter (postoperative) 07/28/2015  . Hydronephrosis 07/14/2015  . Hypertension   . Left foot drop 12/18/2015  . Recurrent UTI   . UPJ (ureteropelvic junction) obstruction 07/28/2015  . UTI (lower urinary tract infection) 07/14/2015    Past Surgical History:  Procedure Laterality Date  . broken foot  05/01/2010  . kidney repair  1985   repaired torn blood vessel in kidney    There were no vitals filed for this visit.      Subjective Assessment - 03/26/16 0926    Subjective Pt states she is doing well today and denies pain today. Patient did not perform her HEP yesterday due to being out of town but performs it regularly otherwise.   Pertinent History surgery on right foot year ago, High blood pressure, uses Left AFO, lives with husband, has steps at front door   Limitations Standing;Walking;House hold activities   How long can you sit comfortably? unlimited   How long can you stand comfortably? 30 mins   How long can you walk comfortably? 15 mintues   Patient Stated Goals walk and be steady   Currently  in Pain? No/denies   Multiple Pain Sites No        NMR: TM walking forward 1.1 mph x5 minutes TM walking side to side .3 mph x3 minutes each Rocker board fwd/bwd/side/side x3 minutes each Blue foam step ups to 6 inch stool x20 Blue foam toe taps to 6 inch stool x20 Blue foam side step ups to left x20 Blue foam side step ups to right x20  Patient required minimal cueing to keep her feet in the correct position during side stepping on the treadmill. Patient needed CGA and close supervision during all balance training activities.              PT Education - 03/26/16 [phone]    Education provided Yes   Education Details importance of continuing HEP   Person(s) Educated Patient   Methods Explanation   Comprehension Verbalized understanding             PT Long Term Goals - 03/11/16 1135      PT LONG TERM GOAL #1   Title Patient will be independent in home exercise program to improve strength/mobility for better functional independence with ADLs.   Time 8   Status On-going     PT LONG TERM GOAL #2   Title Patient (> 76 years old) will complete five times sit to stand test in < 15 seconds indicating  an increased LE strength and improved balance.   Baseline 12.01 sec   Time 8   Period Weeks   Status Partially Met     PT LONG TERM GOAL #3   Title Patient will increase Berg Balance score by > 6 points to demonstrate decreased fall risk during functional activities.   Time 8   Status On-going     PT LONG TERM GOAL #4   Title Patient will tolerate 5 seconds of single leg stance without loss of balance to improve ability to get in and out of shower safely.   Baseline 1 sec   Time 8   Period Weeks   Status Partially Met     PT LONG TERM GOAL #5   Title Patient will reduce timed up and go to <11 seconds to reduce fall risk and demonstrate improved transfer/gait ability.   Baseline 14.78 sec   Time 8   Status Partially Met               Plan - 03/26/16  1000    Clinical Impression Statement Patient demonstrates decreased dynamic standing balance and required consistent cueing to maintain center of gravity in correct position, but required less cueing and assist than the previous session. Patient will continue to benefit from skilled therapy in order to improve dynamic standing balance and decrease falls risk.   Rehab Potential Good   Clinical Impairments Affecting Rehab Potential Patient wears AFO on RLE and has impaired flexibility in BLE hips   PT Frequency 2x / week   PT Duration 8 weeks   PT Treatment/Interventions Therapeutic exercise;Balance training;Neuromuscular re-education;Therapeutic activities   PT Next Visit Plan dynamic standing balance and strengthening   PT Home Exercise Plan Continue as prescribed   Consulted and Agree with Plan of Care Patient      Patient will benefit from skilled therapeutic intervention in order to improve the following deficits and impairments:  Abnormal gait, Decreased balance, Decreased mobility, Difficulty walking, Impaired flexibility, Decreased strength  Visit Diagnosis: Muscle weakness (generalized)  Difficulty in walking, not elsewhere classified     Problem List Patient Active Problem List   Diagnosis Date Noted  . Abnormal gait 12/18/2015  . Left foot drop 12/18/2015  . BP (high blood pressure) 08/24/2015  . Hydronephrosis, left 07/28/2015  . UPJ (ureteropelvic junction) obstruction 07/28/2015  . UTI (lower urinary tract infection) 07/14/2015  . Hydronephrosis 07/14/2015  Alanson Puls, PT, DPT  This entire session was performed under direct supervision and direction of a licensed therapist/therapist assistant . I have personally read, edited and approve of the note as written. Betsy Coder, SPT [place] 03/26/2016, 10:08 AM  [place] MAIN [facility] SERVICES [address] [place], [place], [address] Phone: [phone]   Fax:   [phone]  Name: Abigail Hill MRN: [id number] Date of Birth: 11-01-1950

## 2016-03-27 ENCOUNTER — Ambulatory Visit
Admission: RE | Admit: 2016-03-27 | Discharge: 2016-03-27 | Disposition: A | Discharge status: Home / Self Care | Payer: PPO | Source: Ambulatory Visit | Attending: Internal Medicine | Admitting: Internal Medicine

## 2016-03-27 DIAGNOSIS — R928 Other abnormal and inconclusive findings on diagnostic imaging of breast: Secondary | ICD-10-CM | POA: Diagnosis not present

## 2016-03-27 DIAGNOSIS — R921 Mammographic calcification found on diagnostic imaging of breast: Secondary | ICD-10-CM

## 2016-03-31 ENCOUNTER — Ambulatory Visit: Payer: PPO | Admitting: Physical Therapy

## 2016-03-31 ENCOUNTER — Encounter: Payer: Self-pay | Admitting: Physical Therapy

## 2016-03-31 DIAGNOSIS — M6281 Muscle weakness (generalized): Secondary | ICD-10-CM | POA: Diagnosis not present

## 2016-03-31 DIAGNOSIS — R262 Difficulty in walking, not elsewhere classified: Secondary | ICD-10-CM

## 2016-03-31 NOTE — Therapy (Signed)
[place] MAIN [facility] SERVICES [address] [place], [place], [address] Phone: [phone]   Fax:  [phone]  Physical Therapy Treatment  Patient Details  Name: Abigail Hill MRN: [id number] Date of Birth: 10/27/1950 Referring Provider: Dr. Melrose Nakayama  Encounter Date: 03/31/2016      PT End of Session - 03/31/16 0925    Visit Number 17   Number of Visits 33   Date for PT Re-Evaluation 05/06/16   Authorization Type 17 g codes   PT Start Time 0920   PT Stop Time 1000   PT Time Calculation (min) 40 min   Equipment Utilized During Treatment Gait belt   Activity Tolerance Patient tolerated treatment well;Patient limited by fatigue   Behavior During Therapy [facility] for tasks assessed/performed      Past Medical History:  Diagnosis Date  . Abnormal gait 12/18/2015  . BP (high blood pressure) 08/24/2015  . Cancer ([place])    skin  . Constriction of ureter (postoperative) 07/28/2015  . Hydronephrosis 07/14/2015  . Hypertension   . Left foot drop 12/18/2015  . Recurrent UTI   . UPJ (ureteropelvic junction) obstruction 07/28/2015  . UTI (lower urinary tract infection) 07/14/2015    Past Surgical History:  Procedure Laterality Date  . broken foot  05-07-10  . kidney repair  1985   repaired torn blood vessel in kidney    There were no vitals filed for this visit.      Subjective Assessment - 03/31/16 0924    Subjective Pt states she is doing well today and denies pain today. Patient had a close friend pass away and is sad today.    Patient is accompained by: Family member   Pertinent History surgery on right foot year ago, High blood pressure, uses Left AFO, lives with husband, has steps at front door   Limitations Standing;Walking;House hold activities   How long can you sit comfortably? unlimited   How long can you stand comfortably? 30 mins   How long can you walk comfortably? 15 mintues   Patient Stated Goals walk and be steady   Currently in  Pain? No/denies   Multiple Pain Sites No     Therapeutic exericise: TM walking 1.2 miles / hour x 5 minutes TM side stepping . 3 miles / hour left and right  Matrix side stepping / fwd/ bwd stepping x 5 Standing on purple foam ans head turns left and right with cga feet together and modified tandem with cga Patient needs occasional verbal cueing to improve posture and cueing to correctly perform exercises slowly, She has wide base support with LE and uses UE  For stabilization.                            PT Education - 03/31/16 0924    Education provided Yes   Education Details balance exercise    Person(s) Educated Patient   Methods Explanation   Comprehension Verbalized understanding             PT Long Term Goals - 03/11/16 1135      PT LONG TERM GOAL #1   Title Patient will be independent in home exercise program to improve strength/mobility for better functional independence with ADLs.   Time 8   Status On-going     PT LONG TERM GOAL #2   Title Patient (> 67 years old) will complete five times sit to stand test in <  15 seconds indicating an increased LE strength and improved balance.   Baseline 12.01 sec   Time 8   Period Weeks   Status Partially Met     PT LONG TERM GOAL #3   Title Patient will increase Berg Balance score by > 6 points to demonstrate decreased fall risk during functional activities.   Time 8   Status On-going     PT LONG TERM GOAL #4   Title Patient will tolerate 5 seconds of single leg stance without loss of balance to improve ability to get in and out of shower safely.   Baseline 1 sec   Time 8   Period Weeks   Status Partially Met     PT LONG TERM GOAL #5   Title Patient will reduce timed up and go to <11 seconds to reduce fall risk and demonstrate improved transfer/gait ability.   Baseline 14.78 sec   Time 8   Status Partially Met               Plan - 03/31/16 0925    Clinical Impression Statement  Patient  demonstrates soome incoordination of movement with select exercises and during dynamic standing balance exericses.    Rehab Potential Good   Clinical Impairments Affecting Rehab Potential Patient wears AFO on RLE and has impaired flexibility in BLE hips   PT Frequency 2x / week   PT Duration 8 weeks   PT Treatment/Interventions Therapeutic exercise;Balance training;Neuromuscular re-education;Therapeutic activities   PT Next Visit Plan dynamic standing balance and strengthening   PT Home Exercise Plan Continue as prescribed   Consulted and Agree with Plan of Care Patient      Patient will benefit from skilled therapeutic intervention in order to improve the following deficits and impairments:  Abnormal gait, Decreased balance, Decreased mobility, Difficulty walking, Impaired flexibility, Decreased strength  Visit Diagnosis: Muscle weakness (generalized)  Difficulty in walking, not elsewhere classified     Problem List Patient Active Problem List   Diagnosis Date Noted  . Abnormal gait 12/18/2015  . Left foot drop 12/18/2015  . BP (high blood pressure) 08/24/2015  . Hydronephrosis, left 07/28/2015  . UPJ (ureteropelvic junction) obstruction 07/28/2015  . UTI (lower urinary tract infection) 07/14/2015  . Hydronephrosis 07/14/2015   Alanson Puls, PT, DPT [place], [place] 03/31/2016, 9:27 AM  [place] MAIN [facility] SERVICES [address] [place], [place], [address] Phone: [phone]   Fax:  [phone]  Name: Abigail Hill MRN: [id number] Date of Birth: 05-Jan-1951

## 2016-04-02 ENCOUNTER — Encounter: Payer: Self-pay | Admitting: Physical Therapy

## 2016-04-02 ENCOUNTER — Ambulatory Visit: Payer: PPO | Admitting: Physical Therapy

## 2016-04-02 DIAGNOSIS — R262 Difficulty in walking, not elsewhere classified: Secondary | ICD-10-CM

## 2016-04-02 DIAGNOSIS — M6281 Muscle weakness (generalized): Secondary | ICD-10-CM | POA: Diagnosis not present

## 2016-04-02 NOTE — Therapy (Addendum)
[place] MAIN [facility] SERVICES [address] [place], [place], [address] Phone: [phone]   Fax:  [phone]  Physical Therapy Treatment  Patient Details  Name: Abigail Hill MRN: [id number] Date of Birth: 09/08/1950 Referring Provider: Dr. Melrose Nakayama  Encounter Date: 04/02/2016      PT End of Session - 04/02/16 0932    Visit Number 18   Number of Visits 33   Date for PT Re-Evaluation 05/06/16   Authorization Type 18 g codes   PT Start Time 0925   PT Stop Time 1000   PT Time Calculation (min) 35 min   Equipment Utilized During Treatment Gait belt   Activity Tolerance Patient tolerated treatment well;Patient limited by fatigue   Behavior During Therapy [facility] for tasks assessed/performed      Past Medical History:  Diagnosis Date  . Abnormal gait 12/18/2015  . BP (high blood pressure) 08/24/2015  . Cancer ([place])    skin  . Constriction of ureter (postoperative) 07/28/2015  . Hydronephrosis 07/14/2015  . Hypertension   . Left foot drop 12/18/2015  . Recurrent UTI   . UPJ (ureteropelvic junction) obstruction 07/28/2015  . UTI (lower urinary tract infection) 07/14/2015    Past Surgical History:  Procedure Laterality Date  . broken foot  2010-04-09  . kidney repair  1985   repaired torn blood vessel in kidney    There were no vitals filed for this visit.      Subjective Assessment - 04/02/16 0929    Subjective Pt states she is doing well today and denies pain. Patient is getting frustrated with situation, but is trying to maintain positive attitude.   Patient is accompained by: Family member   Pertinent History surgery on right foot year ago, High blood pressure, uses Left AFO, lives with husband, has steps at front door   Limitations Standing;Walking;House hold activities   How long can you sit comfortably? unlimited   How long can you stand comfortably? 30 mins   How long can you walk comfortably? 15 mintues   Patient Stated Goals walk  and be steady   Currently in Pain? No/denies   Multiple Pain Sites No       Therapeutic exericise: TM walking 1.2 miles / hour x 7 minutes TM side stepping .4 miles / hour left and right x2 minutes each  NMR: Purple foam toe taps to 6 inch stool 20x2 CGA Purple foam step ups to 6 inch stool 20x2 CGA  Side stepping on blue foam x10 laps CGA Sit to stands x10   Patient needs occasional verbal cueing and CGA to maintain center of gravity during all dynamic standing balance activities. Patient required UE support for side stepping.          PT Education - 04/02/16 0931    Education provided Yes   Education Details continuance of HEP   Person(s) Educated Patient   Methods Explanation   Comprehension Verbalized understanding             PT Long Term Goals - 03/11/16 1135      PT LONG TERM GOAL #1   Title Patient will be independent in home exercise program to improve strength/mobility for better functional independence with ADLs.   Time 8   Status On-going     PT LONG TERM GOAL #2   Title Patient (> 36 years old) will complete five times sit to stand test in < 15 seconds indicating an increased LE strength and improved  balance.   Baseline 12.01 sec   Time 8   Period Weeks   Status Partially Met     PT LONG TERM GOAL #3   Title Patient will increase Berg Balance score by > 6 points to demonstrate decreased fall risk during functional activities.   Time 8   Status On-going     PT LONG TERM GOAL #4   Title Patient will tolerate 5 seconds of single leg stance without loss of balance to improve ability to get in and out of shower safely.   Baseline 1 sec   Time 8   Period Weeks   Status Partially Met     PT LONG TERM GOAL #5   Title Patient will reduce timed up and go to <11 seconds to reduce fall risk and demonstrate improved transfer/gait ability.   Baseline 14.78 sec   Time 8   Status Partially Met               Plan - 04/02/16 1002    Clinical  Impression Statement Patient demonstrates wide base of support and decreased dynamic standing balance, though improvements have been seen in her ability to maintain center of gravity during dynamic standing balance acitivities. Patient will continue to benefit from skilled therapy in order to improve dynamic standing balance and endurance.   Rehab Potential Good   Clinical Impairments Affecting Rehab Potential Patient wears AFO on RLE and has impaired flexibility in BLE hips   PT Frequency 2x / week   PT Duration 8 weeks   PT Treatment/Interventions Therapeutic exercise;Balance training;Neuromuscular re-education;Therapeutic activities   PT Next Visit Plan dynamic standing balance and strengthening   PT Home Exercise Plan Continue as prescribed   Consulted and Agree with Plan of Care Patient      Patient will benefit from skilled therapeutic intervention in order to improve the following deficits and impairments:  Abnormal gait, Decreased balance, Decreased mobility, Difficulty walking, Impaired flexibility, Decreased strength  Visit Diagnosis: Muscle weakness (generalized)  Difficulty in walking, not elsewhere classified     Problem List Patient Active Problem List   Diagnosis Date Noted  . Abnormal gait 12/18/2015  . Left foot drop 12/18/2015  . BP (high blood pressure) 08/24/2015  . Hydronephrosis, left 07/28/2015  . UPJ (ureteropelvic junction) obstruction 07/28/2015  . UTI (lower urinary tract infection) 07/14/2015  . Hydronephrosis 07/14/2015   Alanson Puls, PT, DPT This entire session was performed under direct supervision and direction of a licensed therapist/therapist assistant . I have personally read, edited and approve of the note as written. Betsy Coder, SPT [place], [place] S 04/02/2016, 3:22 PM  [place] MAIN [facility] SERVICES [address] [place], [place], [address] Phone: [phone]   Fax:   [phone]  Name: Abigail Hill MRN: [id number] Date of Birth: 1951-01-24

## 2016-04-07 ENCOUNTER — Ambulatory Visit: Payer: PPO | Attending: Neurology | Admitting: Physical Therapy

## 2016-04-07 DIAGNOSIS — M6281 Muscle weakness (generalized): Secondary | ICD-10-CM | POA: Insufficient documentation

## 2016-04-07 DIAGNOSIS — R262 Difficulty in walking, not elsewhere classified: Secondary | ICD-10-CM | POA: Diagnosis not present

## 2016-04-07 NOTE — Therapy (Addendum)
[place] MAIN [facility] SERVICES [address] [place], [place], [address] Phone: [phone]   Fax:  [phone]  Physical Therapy Treatment  Patient Details  Name: Abigail Hill MRN: [id number] Date of Birth: 15-Feb-1951 Referring Provider: Dr. Melrose Nakayama  Encounter Date: 04/07/2016      PT End of Session - 04/07/16 1036    Visit Number 19   Number of Visits 33   Date for PT Re-Evaluation 05/06/16   Authorization Type 19 g codes   PT Start Time 1003-05-07   PT Stop Time 1045   PT Time Calculation (min) 40 min   Equipment Utilized During Treatment Gait belt   Activity Tolerance Patient tolerated treatment well;Patient limited by fatigue   Behavior During Therapy [facility] for tasks assessed/performed      Past Medical History:  Diagnosis Date  . Abnormal gait 12/18/2015  . BP (high blood pressure) 08/24/2015  . Cancer ([place])    skin  . Constriction of ureter (postoperative) 07/28/2015  . Hydronephrosis 07/14/2015  . Hypertension   . Left foot drop 12/18/2015  . Recurrent UTI   . UPJ (ureteropelvic junction) obstruction 07/28/2015  . UTI (lower urinary tract infection) 07/14/2015    Past Surgical History:  Procedure Laterality Date  . broken foot  2010-05-07  . kidney repair  1985   repaired torn blood vessel in kidney    There were no vitals filed for this visit.      Subjective Assessment - 04/07/16 1034    Subjective Pt states she is doing well today and denies pain. Patient says that her left ankle brace is not holding in place due to  the velcro not catching and it is making her feel more unsteady with her walking. She has a Dr. appointment with Dr. Melrose Nakayama this week     Patient is accompained by: Family member   Pertinent History surgery on right foot year ago, High blood pressure, uses Left AFO, lives with husband, has steps at front door   Limitations Standing;Walking;House hold activities   How long can you sit comfortably? unlimited   How long  can you stand comfortably? 30 mins   How long can you walk comfortably? 15 mintues   Patient Stated Goals walk and be steady     Currently in Pain? No/denies    Multiple Pain Sites No    Neuromuscular training: Tandem gait in // bars x 4 laps  Side stepping on blue foam balance beam x 5 lengths of the parallel bars  4 square fwd/bwd, side to side stepping/ diagonal stepping Matrix side stepping/ fwd/bwd walking x 5 Therapeutic  Exercise: Leg press 100 lbs x 20 x 3 4 way hip x 20  CGA and  mod verbal cues used throughout with increased in postural sway and LOB most seen with narrow base of support and while on uneven surfaces. Continues to have balance deficits typical with diagnosis. Patient performs intermediate level exercises without pain behaviors and needs verbal cuing for postural alignment and head positioning                           PT Education - 04/07/16 1035    Education provided Yes   Education Details HEP   Person(s) Educated Patient   Methods Explanation   Comprehension Verbalized understanding             PT Long Term Goals - 04/08/16 1105  PT LONG TERM GOAL #1   Title Patient will be independent in home exercise program to improve strength/mobility for better functional independence with ADLs.   Time 8   Period Weeks   Status On-going     PT LONG TERM GOAL #2   Title Patient (> 68 years old) will complete five times sit to stand test in < 15 seconds indicating an increased LE strength and improved balance.   Baseline 12.01 sec   Time 8   Period Weeks   Status Partially Met     PT LONG TERM GOAL #3   Title Patient will increase Berg Balance score by > 6 points to demonstrate decreased fall risk during functional activities.   Baseline 50/56 on 12/20, improved by 6 points   Time 8   Period Weeks   Status On-going     PT LONG TERM GOAL #4   Title Patient will tolerate 5 seconds of single leg stance without loss of  balance to improve ability to get in and out of shower safely.   Baseline 1 sec   Time 8   Period Weeks   Status Partially Met     PT LONG TERM GOAL #5   Title Patient will reduce timed up and go to <11 seconds to reduce fall risk and demonstrate improved transfer/gait ability.   Baseline 14.78 sec   Time 8   Period Weeks   Status Partially Met               Plan - 04/07/16 1038    Clinical Impression Statement Patient requires CGA-minA during balance activities and min VCs for initial set up and improvement in performance of exercise activities. The patient demonstrates poor ankle DF and uses AFO and decreased motor control to LLE.  Pateint will continue to benefit from skilled PT in order to address BLE strength, balance, and improve her ability to perform gait safely.   Rehab Potential Good   Clinical Impairments Affecting Rehab Potential Patient wears AFO on RLE and has impaired flexibility in BLE hips   PT Frequency 2x / week   PT Duration 8 weeks   PT Treatment/Interventions Therapeutic exercise;Balance training;Neuromuscular re-education;Therapeutic activities   PT Next Visit Plan dynamic standing balance and strengthening   PT Home Exercise Plan Continue as prescribed   Consulted and Agree with Plan of Care Patient      Patient will benefit from skilled therapeutic intervention in order to improve the following deficits and impairments:  Abnormal gait, Decreased balance, Decreased mobility, Difficulty walking, Impaired flexibility, Decreased strength  Visit Diagnosis: Muscle weakness (generalized)  Difficulty in walking, not elsewhere classified     Problem List Patient Active Problem List   Diagnosis Date Noted  . Abnormal gait 12/18/2015  . Left foot drop 12/18/2015  . BP (high blood pressure) 08/24/2015  . Hydronephrosis, left 07/28/2015  . UPJ (ureteropelvic junction) obstruction 07/28/2015  . UTI (lower urinary tract infection) 07/14/2015  .  Hydronephrosis 07/14/2015   Alanson Puls, PT, DPT [name] 04/08/2016, 11:05 AM  [place] MAIN [facility] SERVICES [address] [place], [place], [address] Phone: [phone]   Fax:  [phone]  Name: Abigail Hill MRN: [id number] Date of Birth: [birth year]

## 2016-04-09 ENCOUNTER — Ambulatory Visit: Payer: PPO | Admitting: Physical Therapy

## 2016-04-09 ENCOUNTER — Encounter: Payer: Self-pay | Admitting: Physical Therapy

## 2016-04-09 DIAGNOSIS — R262 Difficulty in walking, not elsewhere classified: Secondary | ICD-10-CM

## 2016-04-09 DIAGNOSIS — M6281 Muscle weakness (generalized): Secondary | ICD-10-CM | POA: Diagnosis not present

## 2016-04-09 NOTE — Therapy (Addendum)
[place] MAIN [facility] SERVICES [address] [place], [place], [address] Phone: [phone]   Fax:  [phone]  Physical Therapy Treatment/Progress Note  Patient Details  Name: Abigail Hill MRN: [id number] Date of Birth: [birth year] Referring Provider: Dr. Melrose Nakayama  Encounter Date: 04/09/2016      PT End of Session - 04/09/16 0929    Visit Number 20   Number of Visits 33   Date for PT Re-Evaluation 05/06/16   Authorization Type 20 g codes   PT Start Time 0920   PT Stop Time 1000   PT Time Calculation (min) 40 min   Equipment Utilized During Treatment Gait belt   Activity Tolerance Patient tolerated treatment well;Patient limited by fatigue   Behavior During Therapy [facility] for tasks assessed/performed      Past Medical History:  Diagnosis Date  . Abnormal gait 12/18/2015  . BP (high blood pressure) 08/24/2015  . Cancer ([place])    skin  . Constriction of ureter (postoperative) 07/28/2015  . Hydronephrosis 07/14/2015  . Hypertension   . Left foot drop 12/18/2015  . Recurrent UTI   . UPJ (ureteropelvic junction) obstruction 07/28/2015  . UTI (lower urinary tract infection) 07/14/2015    Past Surgical History:  Procedure Laterality Date  . broken foot  05/16/10  . kidney repair  1985   repaired torn blood vessel in kidney    There were no vitals filed for this visit.      Subjective Assessment - 04/09/16 0927    Subjective Patient states she is doing well today and denies pain. She states she is discouraged and hopes her visit with the neurologist on 2/12 will give her answers. She had the velcro fixed on her brace which has helped her feel a little more steady during ambulation.   Patient is accompained by: Family member   Pertinent History surgery on right foot year ago, High blood pressure, uses Left AFO, lives with husband, has steps at front door   Limitations Standing;Walking;House hold activities   How long can you sit comfortably?  unlimited   How long can you stand comfortably? 30 mins   How long can you walk comfortably? 15 mintues   Patient Stated Goals walk and be steady   Currently in Pain? No/denies   Multiple Pain Sites No      NMR:  TM 1.26mh x762mutes Purple foam toe taps x10 BLE CGA Purple foam step ups x10 CGA   Outcome Measures:  TEST Outcome Interpretation  5 times sit<>stand 10.21 sec >68 yo, >15 sec indicates increased risk for falls  10 meter walk test                .8573m<1.0 m/s indicates increased risk for falls; limited community ambulator  6 minute walk test                 [id number] ambulator  Timed up and Go         11.22sec <14 sec indicates increased risk for falls    Patient demonstrates improvement in all outcome measures utilized.         PT Education - 04/09/16 0929 [phone] provided Yes   Education Details balance HEP   Person(s) Educated Patient   Methods Explanation   Comprehension Verbalized understanding             PT Long Term Goals - 04/09/16 1012      PT LONG TERM  GOAL #1   Title Patient will be independent in home exercise program to improve strength/mobility for better functional independence with ADLs.   Time 8   Period Weeks   Status On-going     PT LONG TERM GOAL #2   Title Patient (> 68 years old) will complete five times sit to stand test in < 15 seconds indicating an increased LE strength and improved balance.   Baseline 10.21   Time 8   Period Weeks   Status Achieved     PT LONG TERM GOAL #3   Title Patient will increase Berg Balance score by > 6 points to demonstrate decreased fall risk during functional activities.   Baseline 50/56 on 12/20, improved by 6 points   Time 8   Period Weeks   Status On-going     PT LONG TERM GOAL #4   Title Patient will tolerate 5 seconds of single leg stance without loss of balance to improve ability to get in and out of shower safely.   Baseline 1 sec   Time 8    Period Weeks   Status Partially Met     PT LONG TERM GOAL #5   Title Patient will reduce timed up and go to <11 seconds to reduce fall risk and demonstrate improved transfer/gait ability.   Baseline 11.22   Time 8   Period Weeks   Status Partially Met               Plan - 04/09/16 1009    Clinical Impression Statement Patient requires CGA and verbal cueing during dynamic standing balance activities to maintain center of gravity. The patient demonstrated improvement in each outcome measure tested and her increased confidence in her abilities is evident. Patient will continue to benefit from skilled PT in order to continue improvement in dynamic standing balance and improve gait speed.   Rehab Potential Good   Clinical Impairments Affecting Rehab Potential Patient wears AFO on RLE and has impaired flexibility in BLE hips   PT Frequency 2x / week   PT Duration 8 weeks   PT Treatment/Interventions Therapeutic exercise;Balance training;Neuromuscular re-education;Therapeutic activities   PT Next Visit Plan dynamic standing balance and strengthening   PT Home Exercise Plan Continue as prescribed   Consulted and Agree with Plan of Care Patient      Patient will benefit from skilled therapeutic intervention in order to improve the following deficits and impairments:  Abnormal gait, Decreased balance, Decreased mobility, Difficulty walking, Impaired flexibility, Decreased strength  Visit Diagnosis: Muscle weakness (generalized)  Difficulty in walking, not elsewhere classified     Problem List Patient Active Problem List   Diagnosis Date Noted  . Abnormal gait 12/18/2015  . Left foot drop 12/18/2015  . BP (high blood pressure) 08/24/2015  . Hydronephrosis, left 07/28/2015  . UPJ (ureteropelvic junction) obstruction 07/28/2015  . UTI (lower urinary tract infection) 07/14/2015  . Hydronephrosis 07/14/2015  Alanson Puls, PT, DPT This entire session was performed under  direct supervision and direction of a licensed therapist/therapist assistant . I have personally read, edited and approve of the note as written. Betsy Coder, SPT Betsy Coder 04/09/2016, 10:16 AM  [place] MAIN [facility] SERVICES [address] [place], [place], [address] Phone: [phone]   Fax:  [phone]  Name: RUARI DUGGAN MRN: [id number] Date of Birth: 04-03-1950

## 2016-04-14 ENCOUNTER — Ambulatory Visit: Payer: PPO | Admitting: Physical Therapy

## 2016-04-14 ENCOUNTER — Encounter: Payer: Self-pay | Admitting: Physical Therapy

## 2016-04-14 DIAGNOSIS — M6281 Muscle weakness (generalized): Secondary | ICD-10-CM | POA: Diagnosis not present

## 2016-04-14 DIAGNOSIS — R269 Unspecified abnormalities of gait and mobility: Secondary | ICD-10-CM | POA: Diagnosis not present

## 2016-04-14 DIAGNOSIS — R29898 Other symptoms and signs involving the musculoskeletal system: Secondary | ICD-10-CM | POA: Diagnosis not present

## 2016-04-14 DIAGNOSIS — R262 Difficulty in walking, not elsewhere classified: Secondary | ICD-10-CM

## 2016-04-14 DIAGNOSIS — I639 Cerebral infarction, unspecified: Secondary | ICD-10-CM | POA: Diagnosis not present

## 2016-04-14 NOTE — Therapy (Addendum)
[place] MAIN [facility] SERVICES [address] [place], [place], [address] Phone: [phone]   Fax:  [phone]  Physical Therapy Treatment  Patient Details  Name: Abigail Hill MRN: [id number] Date of Birth: [birth year] Referring Provider: Dr. Melrose Nakayama  Encounter Date: 04/14/2016      PT End of Session - 04/14/16 0838    Visit Number 21   Number of Visits 33   Date for PT Re-Evaluation 05/06/16   Authorization Type 21 g codes   PT Start Time 0830   PT Stop Time 0915   PT Time Calculation (min) 45 min   Equipment Utilized During Treatment Gait belt   Activity Tolerance Patient tolerated treatment well;Patient limited by fatigue   Behavior During Therapy [facility] for tasks assessed/performed      Past Medical History:  Diagnosis Date  . Abnormal gait 12/18/2015  . BP (high blood pressure) 08/24/2015  . Cancer ([place])    skin  . Constriction of ureter (postoperative) 07/28/2015  . Hydronephrosis 07/14/2015  . Hypertension   . Left foot drop 12/18/2015  . Recurrent UTI   . UPJ (ureteropelvic junction) obstruction 07/28/2015  . UTI (lower urinary tract infection) 07/14/2015    Past Surgical History:  Procedure Laterality Date  . broken foot  2012  . kidney repair  1985   repaired torn blood vessel in kidney    There were no vitals filed for this visit.      Subjective Assessment - 04/14/16 0852    Subjective Patient states she is doing well today and denies pain. She feels like she was doing really well and then over the weekend she felt that her left was not coordinated.    Patient is accompained by: Family member   Pertinent History surgery on right foot year ago, High blood pressure, uses Left AFO, lives with husband, has steps at front door   Limitations Standing;Walking;House hold activities   How long can you sit comfortably? unlimited   How long can you stand comfortably? 30 mins   How long can you walk comfortably? 15 mintues    Patient Stated Goals walk and be steady   Currently in Pain? No/denies   Multiple Pain Sites No      Neuromuscular re ed:  NBOS feet together on airex with eyes close 15 sec x 5 sets NBOS feet together on airex with lateral and up and down head movement 30 sec x 3 sets Tandem stance on airex 20 sec x 2 sets   Toe tapping 6 inch stool without UE assist Tandem gait in // bars x 4 laps  Side stepping on blue foam balance beam x 5 lengths of the parallel bars  4 square fwd/bwd, side to side stepping/ diagonal stepping TM walking without UE support . 2 miles per hour x 5 mins left and right.    Pt required min to mod assistance for safety with min verbal cues for correct form   Therapeutic exercise; Leg press 100 lbs x 20 x 3  No reports of pain and needs cues to perform exercise slowly .                             PT Education - 04/14/16 [id number]    Education provided Yes   Education Details balance and HEP   Person(s) Educated Patient   Methods Explanation   Comprehension Verbalized understanding  PT Long Term Goals - 04/09/16 1012      PT LONG TERM GOAL #1   Title Patient will be independent in home exercise program to improve strength/mobility for better functional independence with ADLs.   Time 8   Period Weeks   Status On-going     PT LONG TERM GOAL #2   Title Patient (> 40 years old) will complete five times sit to stand test in < 15 seconds indicating an increased LE strength and improved balance.   Baseline 10.21   Time 8   Period Weeks   Status Achieved     PT LONG TERM GOAL #3   Title Patient will increase Berg Balance score by > 6 points to demonstrate decreased fall risk during functional activities.   Baseline 50/56 on 12/20, improved by 6 points   Time 8   Period Weeks   Status On-going     PT LONG TERM GOAL #4   Title Patient will tolerate 5 seconds of single leg stance without loss of balance to improve  ability to get in and out of shower safely.   Baseline 1 sec   Time 8   Period Weeks   Status Partially Met     PT LONG TERM GOAL #5   Title Patient will reduce timed up and go to <11 seconds to reduce fall risk and demonstrate improved transfer/gait ability.   Baseline 11.22   Time 8   Period Weeks   Status Partially Met               Plan - 04/14/16 0839    Clinical Impression Statement Patient requires consistent cueing to maintain correct position during balance activities and side stepping on treadmill. Patient demonstrates difficulty with dynamic standing balance and increased postural sway while on purple foam. Patient will continue to benefit from skilled therapy in order to improve dynamic standing balance and increase endurance   Rehab Potential Good   Clinical Impairments Affecting Rehab Potential Patient wears AFO on RLE and has impaired flexibility in BLE hips   PT Frequency 2x / week   PT Duration 8 weeks   PT Treatment/Interventions Therapeutic exercise;Balance training;Neuromuscular re-education;Therapeutic activities   PT Next Visit Plan dynamic standing balance and strengthening   PT Home Exercise Plan Continue as prescribed   Consulted and Agree with Plan of Care Patient      Patient will benefit from skilled therapeutic intervention in order to improve the following deficits and impairments:  Abnormal gait, Decreased balance, Decreased mobility, Difficulty walking, Impaired flexibility, Decreased strength  Visit Diagnosis: Muscle weakness (generalized)  Difficulty in walking, not elsewhere classified     Problem List Patient Active Problem List   Diagnosis Date Noted  . Abnormal gait 12/18/2015  . Left foot drop 12/18/2015  . BP (high blood pressure) 08/24/2015  . Hydronephrosis, left 07/28/2015  . UPJ (ureteropelvic junction) obstruction 07/28/2015  . UTI (lower urinary tract infection) 07/14/2015  . Hydronephrosis 07/14/2015  Alanson Puls, PT, DPT  [place], Minette Headland S 04/14/2016, 8:53 AM  [place] MAIN [facility] SERVICES [address] [place], [place], [address] Phone: [phone]   Fax:  [phone]  Name: Abigail Hill MRN: [id number] Date of Birth: 09-08-1950

## 2016-04-16 ENCOUNTER — Encounter: Payer: Self-pay | Admitting: Physical Therapy

## 2016-04-16 ENCOUNTER — Ambulatory Visit: Payer: PPO | Admitting: Physical Therapy

## 2016-04-16 DIAGNOSIS — M6281 Muscle weakness (generalized): Secondary | ICD-10-CM

## 2016-04-16 DIAGNOSIS — R262 Difficulty in walking, not elsewhere classified: Secondary | ICD-10-CM

## 2016-04-16 NOTE — Therapy (Signed)
[place] MAIN [facility] SERVICES [address] [place], [place], [address] Phone: [phone]   Fax:  [phone]  Physical Therapy Treatment  Patient Details  Name: Abigail Hill MRN: [id number] Date of Birth: 10-03-1950 Referring Provider: Dr. Melrose Nakayama  Encounter Date: 04/16/2016      PT End of Session - 04/16/16 0844    Visit Number 22   Number of Visits 33   Date for PT Re-Evaluation 05/06/16   Authorization Type 22 g codes   PT Start Time 0835   PT Stop Time 0915   PT Time Calculation (min) 40 min   Equipment Utilized During Treatment Gait belt   Activity Tolerance Patient tolerated treatment well;Patient limited by fatigue   Behavior During Therapy [facility] for tasks assessed/performed      Past Medical History:  Diagnosis Date  . Abnormal gait 12/18/2015  . BP (high blood pressure) 08/24/2015  . Cancer ([place])    skin  . Constriction of ureter (postoperative) 07/28/2015  . Hydronephrosis 07/14/2015  . Hypertension   . Left foot drop 12/18/2015  . Recurrent UTI   . UPJ (ureteropelvic junction) obstruction 07/28/2015  . UTI (lower urinary tract infection) 07/14/2015    Past Surgical History:  Procedure Laterality Date  . broken foot  2012  . kidney repair  1985   repaired torn blood vessel in kidney    There were no vitals filed for this visit.      Subjective Assessment - 04/16/16 0840    Subjective Patient states she is doing well today and denies pain. She saw the neurologist Monday and was disappointed. They are going to schedule an MRI soon and possibly perform a needle EMG. She assumes the needle EMG will be to her L lower leg but she is unsure, because the doctor never told her.   Patient is accompained by: Family member   Pertinent History surgery on right foot year ago, High blood pressure, uses Left AFO, lives with husband, has steps at front door   Limitations Standing;Walking;House hold activities   How long can you sit  comfortably? unlimited   How long can you stand comfortably? 30 mins   How long can you walk comfortably? 15 mintues   Patient Stated Goals walk and be steady   Currently in Pain? No/denies   Multiple Pain Sites No      Therapeutic exercise: TM walking 1.2 miles / hour x 7 minutes CGA Matrix machine side stepping and backward stepping 7.5lbs x5 each CGA   NMR: Purple foam toe taps to 6 inch stool 10x2 CGA Purple foam step ups to 6 inch stool 20x2 CGA  Four square stepping x5 minutes CGA  Patient required tactile cueing to maintain center of gravity during purple foam step ups.         PT Education - 04/16/16 0843    Education provided Yes   Education Details balance exercises   Person(s) Educated Patient   Methods Explanation   Comprehension Verbalized understanding             PT Long Term Goals - 04/09/16 1012      PT LONG TERM GOAL #1   Title Patient will be independent in home exercise program to improve strength/mobility for better functional independence with ADLs.   Time 8   Period Weeks   Status On-going     PT LONG TERM GOAL #2   Title Patient (> 68 years old) will complete five times sit  to stand test in < 15 seconds indicating an increased LE strength and improved balance.   Baseline 10.21   Time 8   Period Weeks   Status Achieved     PT LONG TERM GOAL #3   Title Patient will increase Berg Balance score by > 6 points to demonstrate decreased fall risk during functional activities.   Baseline 50/56 on 12/20, improved by 6 points   Time 8   Period Weeks   Status On-going     PT LONG TERM GOAL #4   Title Patient will tolerate 5 seconds of single leg stance without loss of balance to improve ability to get in and out of shower safely.   Baseline 1 sec   Time 8   Period Weeks   Status Partially Met     PT LONG TERM GOAL #5   Title Patient will reduce timed up and go to <11 seconds to reduce fall risk and demonstrate improved transfer/gait  ability.   Baseline 11.22   Time 8   Period Weeks   Status Partially Met               Plan - 04/16/16 1001    Clinical Impression Statement Patient requires tactile cueing to maintain center of gravity during dynamic standing balance activities of purple foam step ups to 6 inch stool. Patient shows improvement in her ability to decrease UE support during all dynamic standing balance activities. Patient will continue to benefit from skilled therapy in order to continue improving her dynamic standing balance and improve gait mechanics.   Rehab Potential Good   Clinical Impairments Affecting Rehab Potential Patient wears AFO on RLE and has impaired flexibility in BLE hips   PT Frequency 2x / week   PT Duration 8 weeks   PT Treatment/Interventions Therapeutic exercise;Balance training;Neuromuscular re-education;Therapeutic activities   PT Next Visit Plan dynamic standing balance and strengthening   PT Home Exercise Plan Continue as prescribed   Consulted and Agree with Plan of Care Patient      Patient will benefit from skilled therapeutic intervention in order to improve the following deficits and impairments:  Abnormal gait, Decreased balance, Decreased mobility, Difficulty walking, Impaired flexibility, Decreased strength  Visit Diagnosis: Muscle weakness (generalized)  Difficulty in walking, not elsewhere classified     Problem List Patient Active Problem List   Diagnosis Date Noted  . Abnormal gait 12/18/2015  . Left foot drop 12/18/2015  . BP (high blood pressure) 08/24/2015  . Hydronephrosis, left 07/28/2015  . UPJ (ureteropelvic junction) obstruction 07/28/2015  . UTI (lower urinary tract infection) 07/14/2015  . Hydronephrosis 07/14/2015  This entire session was performed under direct supervision and direction of a licensed therapist/therapist assistant . I have personally read, edited and approve of the note as written. Betsy Coder, SPT [place], [place], DPT 04/16/2016, 10:03 AM  [place] MAIN [facility] SERVICES [address] [place], [place], [address] Phone: [phone]   Fax:  [phone]  Name: SABA GOMM MRN: [id number] Date of Birth: 12-Feb-1951

## 2016-04-18 ENCOUNTER — Other Ambulatory Visit: Payer: Self-pay | Admitting: Neurology

## 2016-04-18 DIAGNOSIS — I639 Cerebral infarction, unspecified: Secondary | ICD-10-CM

## 2016-04-20 DIAGNOSIS — R29898 Other symptoms and signs involving the musculoskeletal system: Secondary | ICD-10-CM | POA: Insufficient documentation

## 2016-04-22 ENCOUNTER — Ambulatory Visit: Payer: PPO | Admitting: Physical Therapy

## 2016-04-22 ENCOUNTER — Encounter: Payer: Self-pay | Admitting: Physical Therapy

## 2016-04-22 DIAGNOSIS — R262 Difficulty in walking, not elsewhere classified: Secondary | ICD-10-CM

## 2016-04-22 DIAGNOSIS — M6281 Muscle weakness (generalized): Secondary | ICD-10-CM

## 2016-04-22 NOTE — Therapy (Signed)
[place] MAIN [facility] SERVICES [address] [place], [place], [address] Phone: [phone]   Fax:  [phone]  Physical Therapy Treatment  Patient Details  Name: Abigail Hill MRN: [id number] Date of Birth: 08-09-1950 Referring Provider: Dr. Melrose Nakayama  Encounter Date: 04/22/2016      PT End of Session - 04/22/16 0842    Visit Number 23   Number of Visits 33   Date for PT Re-Evaluation 05/06/16   Authorization Type 23 g codes   PT Start Time 0835   PT Stop Time 0915   PT Time Calculation (min) 40 min   Equipment Utilized During Treatment Gait belt   Activity Tolerance Patient tolerated treatment well;Patient limited by fatigue   Behavior During Therapy [facility] for tasks assessed/performed      Past Medical History:  Diagnosis Date  . Abnormal gait 12/18/2015  . BP (high blood pressure) 08/24/2015  . Cancer ([place])    skin  . Constriction of ureter (postoperative) 07/28/2015  . Hydronephrosis 07/14/2015  . Hypertension   . Left foot drop 12/18/2015  . Recurrent UTI   . UPJ (ureteropelvic junction) obstruction 07/28/2015  . UTI (lower urinary tract infection) 07/14/2015    Past Surgical History:  Procedure Laterality Date  . broken foot  2012  . kidney repair  1985   repaired torn blood vessel in kidney    There were no vitals filed for this visit.      Subjective Assessment - 04/22/16 0840    Subjective Patient states she is doing well today and denies pain. She is still very frustrated with her neurologist and is hoping he is going to schedule an MRI.    Patient is accompained by: Family member   Pertinent History surgery on right foot year ago, High blood pressure, uses Left AFO, lives with husband, has steps at front door   Limitations Standing;Walking;House hold activities   How long can you sit comfortably? unlimited   How long can you stand comfortably? 30 mins   How long can you walk comfortably? 15 mintues   Patient Stated  Goals walk and be steady   Currently in Pain? No/denies   Multiple Pain Sites No      Therapeutic exercise: TM walking 1.2 miles / hour x 7 minutes CGA Matrix machine side stepping and backward stepping 7.5lbs x5 each CGA   NMR: Blue foam step ups to 6 inch stool to blue foam fwd/bwd x25 each Toe tapping to 5 balance stones x3 minutes  Patient required verbal and tactile cueing during steps and also required intermittent UE support.           PT Education - 04/22/16 [phone]    Education provided Yes   Education Details continuance of HEP   Person(s) Educated Patient   Methods Explanation   Comprehension Verbalized understanding             PT Long Term Goals - 04/09/16 1012      PT LONG TERM GOAL #1   Title Patient will be independent in home exercise program to improve strength/mobility for better functional independence with ADLs.   Time 8   Period Weeks   Status On-going     PT LONG TERM GOAL #2   Title Patient (> 68 years old) will complete five times sit to stand test in < 15 seconds indicating an increased LE strength and improved balance.   Baseline 10.21   Time 8   Period Weeks  Status Achieved     PT LONG TERM GOAL #3   Title Patient will increase Berg Balance score by > 6 points to demonstrate decreased fall risk during functional activities.   Baseline 50/56 on 12/20, improved by 6 points   Time 8   Period Weeks   Status On-going     PT LONG TERM GOAL #4   Title Patient will tolerate 5 seconds of single leg stance without loss of balance to improve ability to get in and out of shower safely.   Baseline 1 sec   Time 8   Period Weeks   Status Partially Met     PT LONG TERM GOAL #5   Title Patient will reduce timed up and go to <11 seconds to reduce fall risk and demonstrate improved transfer/gait ability.   Baseline 11.22   Time 8   Period Weeks   Status Partially Met               Plan - 04/22/16 0915    Clinical Impression  Statement Patient demonstrates improved dynamic standing balance and ability to tolerate single leg activities. Patient has loss of balance with weight shifting tasks and required verbal and tactile cueing during blue foam to 6 inch stool to blue foam steps, especially stepping up backwards. She required intermittent use of UE support during step ups. Patient will continue to benefit from skilled therapy in order to continue improving dynamic standing balance so as to reduce risk for falls.   Rehab Potential Good   Clinical Impairments Affecting Rehab Potential Patient wears AFO on RLE and has impaired flexibility in BLE hips   PT Frequency 2x / week   PT Duration 8 weeks   PT Treatment/Interventions Therapeutic exercise;Balance training;Neuromuscular re-education;Therapeutic activities   PT Next Visit Plan dynamic standing balance and strengthening   PT Home Exercise Plan Continue as prescribed   Consulted and Agree with Plan of Care Patient      Patient will benefit from skilled therapeutic intervention in order to improve the following deficits and impairments:  Abnormal gait, Decreased balance, Decreased mobility, Difficulty walking, Impaired flexibility, Decreased strength  Visit Diagnosis: Muscle weakness (generalized)  Difficulty in walking, not elsewhere classified     Problem List Patient Active Problem List   Diagnosis Date Noted  . Abnormal gait 12/18/2015  . Left foot drop 12/18/2015  . BP (high blood pressure) 08/24/2015  . Hydronephrosis, left 07/28/2015  . UPJ (ureteropelvic junction) obstruction 07/28/2015  . UTI (lower urinary tract infection) 07/14/2015  . Hydronephrosis 07/14/2015  This entire session was performed under direct supervision and direction of a licensed therapist/therapist assistant . I have personally read, edited and approve of the note as written.Betsy Coder, SPT [place], [place], [place], DPT 04/22/2016, 9:56 AM  [place] MAIN [facility] SERVICES [address] [place], [place], [address] Phone: [phone]   Fax:  [phone]  Name: DEMARIE UHLIG MRN: [id number] Date of Birth: [birth year]

## 2016-04-28 ENCOUNTER — Ambulatory Visit: Payer: PPO | Admitting: Physical Therapy

## 2016-04-30 ENCOUNTER — Encounter: Payer: PPO | Admitting: Physical Therapy

## 2016-05-14 ENCOUNTER — Encounter: Payer: Self-pay | Admitting: Physical Therapy

## 2016-05-14 ENCOUNTER — Ambulatory Visit: Payer: PPO | Attending: Neurology | Admitting: Physical Therapy

## 2016-05-14 DIAGNOSIS — R262 Difficulty in walking, not elsewhere classified: Secondary | ICD-10-CM | POA: Insufficient documentation

## 2016-05-14 DIAGNOSIS — M6281 Muscle weakness (generalized): Secondary | ICD-10-CM | POA: Insufficient documentation

## 2016-05-14 NOTE — Therapy (Signed)
[place] MAIN [facility] SERVICES [address] [place], [place], [address] Phone: [phone]   Fax:  [phone]  Physical Therapy Treatment/ DC Summary  Patient Details  Name: Abigail Hill MRN: [id number] Date of Birth: Jul 08, 1950 Referring Provider: Dr. Melrose Nakayama  Encounter Date: 05/14/2016      PT End of Session - 05/14/16 0859    Visit Number 24   Number of Visits 33   Date for PT Re-Evaluation 05/14/16   Authorization Type 23 g codes   PT Start Time 0840   PT Stop Time 0910   PT Time Calculation (min) 30 min   Equipment Utilized During Treatment Gait belt   Activity Tolerance Patient tolerated treatment well;Patient limited by fatigue   Behavior During Therapy [facility] for tasks assessed/performed      Past Medical History:  Diagnosis Date  . Abnormal gait 12/18/2015  . BP (high blood pressure) 08/24/2015  . Cancer ([place])    skin  . Constriction of ureter (postoperative) 07/28/2015  . Hydronephrosis 07/14/2015  . Hypertension   . Left foot drop 12/18/2015  . Recurrent UTI   . UPJ (ureteropelvic junction) obstruction 07/28/2015  . UTI (lower urinary tract infection) 07/14/2015    Past Surgical History:  Procedure Laterality Date  . broken foot  2012  . kidney repair  1985   repaired torn blood vessel in kidney    There were no vitals filed for this visit.      Subjective Assessment - 05/14/16 0855    Subjective Patient reports that she fell off the TM at the gym stepping off the machine. She has a bruise on her left wrist and her buttocks. she has a MRI tomorrow.    Patient is accompained by: Family member   Pertinent History surgery on right foot year ago, High blood pressure, uses Left AFO, lives with husband, has steps at front door   Limitations Standing;Walking;House hold activities   How long can you sit comfortably? unlimited   How long can you stand comfortably? 30 mins   How long can you walk comfortably? 15 mintues   Patient Stated Goals walk and be steady   Currently in Pain? No/denies   Multiple Pain Sites No        OUTCOME MEASURES: TEST Outcome Interpretation  5 times sit<>stand NT >25 yo, >15 sec indicates increased risk for falls  10 meter walk test .79             m/s <1.0 m/s indicates increased risk for falls; limited community ambulator  Timed up and Go      10.13       sec <14 sec indicates increased risk for falls      Berg Balance Assessment 50/56 <36/56 (100% risk for falls), 37-45 (80% risk for falls); 46-51 (>50% risk for falls); 52-55 (lower risk <25% of falls)     Reviewed ascending and descending steps with 1 rail. Patient is having right sided buttocks pain from a recent fall off the TM at the gym and was not able to do the 5 x sit to stand test due to the right buttocks pain. Patient made progress towards goals with berg balance test 50/56,  gait speed improved to .79 m/sec, and TUG improved to 10.13 sec.  She is independent with HEP and is working out at [employer].  Patient is being DC today after review of HEP and safety with steps.  PT Education - May 16, 2016 0859    Education provided Yes   Education Details continue with HEP   Person(s) Educated Patient   Methods Explanation   Comprehension Verbalized understanding             PT Long Term Goals - 05/16/2016 0941      PT LONG TERM GOAL #1   Title Patient will be independent in home exercise program to improve strength/mobility for better functional independence with ADLs.   Time 8   Period Weeks   Status Achieved     PT LONG TERM GOAL #2   Title Patient (> 67 years old) will complete five times sit to stand test in < 15 seconds indicating an increased LE strength and improved balance.   Baseline NT due to buttocks pain today from a fall   Time 8   Status On-going     PT LONG TERM GOAL #3   Title Patient will increase Berg Balance score by > 6 points to  demonstrate decreased fall risk during functional activities.   Baseline 50/56   Time 8   Period Weeks   Status Partially Met     PT LONG TERM GOAL #4   Title Patient will tolerate 5 seconds of single leg stance without loss of balance to improve ability to get in and out of shower safely.   Baseline 1 sec   Time 8   Period Weeks   Status On-going     PT LONG TERM GOAL #5   Title Patient will reduce timed up and go to <11 seconds to reduce fall risk and demonstrate improved transfer/gait ability.   Baseline 10.13   Time 8   Status Achieved               Plan - 2016-05-16 0900    Clinical Impression Statement Patient was instructed in HEP and performed outcome measures for DC today. She was instructed in safety with ascending and descending steps with 1 rail, HEP was reviewed and goals were reviewed.    Rehab Potential Good   Clinical Impairments Affecting Rehab Potential Patient wears AFO on RLE and has impaired flexibility in BLE hips   PT Frequency 2x / week   PT Duration 8 weeks   PT Treatment/Interventions Therapeutic exercise;Balance training;Neuromuscular re-education;Therapeutic activities   PT Next Visit Plan dynamic standing balance and strengthening   PT Home Exercise Plan Continue as prescribed   Consulted and Agree with Plan of Care Patient      Patient will benefit from skilled therapeutic intervention in order to improve the following deficits and impairments:  Abnormal gait, Decreased balance, Decreased mobility, Difficulty walking, Impaired flexibility, Decreased strength  Visit Diagnosis: Muscle weakness (generalized)  Difficulty in walking, not elsewhere classified       G-Codes - May 16, 2016 0946    Functional Assessment Tool Used (Outpatient Only) TUG, Berg Balance Test, 5sSTS, SLS, clinical judgement   Functional Limitation Mobility: Walking and moving around   Mobility: Walking and Moving Around Goal Status [phone]) At least 20 percent but less  than 40 percent impaired, limited or restricted   Mobility: Walking and Moving Around Discharge Status [phone]) At least 20 percent but less than 40 percent impaired, limited or restricted      Problem List Patient Active Problem List   Diagnosis Date Noted  . Abnormal gait 12/18/2015  . Left foot drop 12/18/2015  . BP (high blood pressure) 08/24/2015  . Hydronephrosis, left 07/28/2015  . UPJ (ureteropelvic  junction) obstruction 07/28/2015  . UTI (lower urinary tract infection) 07/14/2015  . Hydronephrosis 07/14/2015   Alanson Puls, PT, DPT [place], Minette [place] S 05/14/2016, 9:48 AM  [place] MAIN [facility] SERVICES [address] [place], [place], [address] Phone: [phone]   Fax:  [phone]  Name: Abigail Hill MRN: [id number] Date of Birth: 01-May-1950

## 2016-05-15 ENCOUNTER — Ambulatory Visit
Admission: RE | Admit: 2016-05-15 | Discharge: 2016-05-15 | Disposition: A | Discharge status: Home / Self Care | Payer: PPO | Source: Ambulatory Visit | Attending: Neurology | Admitting: Neurology

## 2016-05-15 DIAGNOSIS — G918 Other hydrocephalus: Secondary | ICD-10-CM | POA: Insufficient documentation

## 2016-05-15 DIAGNOSIS — R9082 White matter disease, unspecified: Secondary | ICD-10-CM | POA: Insufficient documentation

## 2016-05-15 DIAGNOSIS — Z8673 Personal history of transient ischemic attack (TIA), and cerebral infarction without residual deficits: Secondary | ICD-10-CM | POA: Diagnosis not present

## 2016-05-15 DIAGNOSIS — R51 Headache: Secondary | ICD-10-CM | POA: Diagnosis not present

## 2016-05-15 DIAGNOSIS — I639 Cerebral infarction, unspecified: Secondary | ICD-10-CM

## 2016-06-11 ENCOUNTER — Other Ambulatory Visit: Payer: Self-pay | Admitting: Internal Medicine

## 2016-06-11 DIAGNOSIS — M5432 Sciatica, left side: Secondary | ICD-10-CM

## 2016-06-19 ENCOUNTER — Ambulatory Visit
Admission: RE | Admit: 2016-06-19 | Discharge: 2016-06-19 | Disposition: A | Discharge status: Home / Self Care | Payer: PPO | Source: Ambulatory Visit | Attending: Internal Medicine | Admitting: Internal Medicine

## 2016-06-19 DIAGNOSIS — M5432 Sciatica, left side: Secondary | ICD-10-CM | POA: Diagnosis not present

## 2016-06-19 DIAGNOSIS — M8448XA Pathological fracture, other site, initial encounter for fracture: Secondary | ICD-10-CM | POA: Insufficient documentation

## 2016-06-19 DIAGNOSIS — M5136 Other intervertebral disc degeneration, lumbar region: Secondary | ICD-10-CM | POA: Diagnosis not present

## 2016-06-19 DIAGNOSIS — S3210XA Unspecified fracture of sacrum, initial encounter for closed fracture: Secondary | ICD-10-CM | POA: Diagnosis not present

## 2016-07-03 DIAGNOSIS — G912 (Idiopathic) normal pressure hydrocephalus: Secondary | ICD-10-CM | POA: Diagnosis not present

## 2016-07-23 DIAGNOSIS — I10 Essential (primary) hypertension: Secondary | ICD-10-CM | POA: Diagnosis not present

## 2016-07-24 DIAGNOSIS — G912 (Idiopathic) normal pressure hydrocephalus: Secondary | ICD-10-CM | POA: Diagnosis not present

## 2016-07-30 DIAGNOSIS — Z0000 Encounter for general adult medical examination without abnormal findings: Secondary | ICD-10-CM | POA: Diagnosis not present

## 2016-07-30 DIAGNOSIS — M810 Age-related osteoporosis without current pathological fracture: Secondary | ICD-10-CM | POA: Diagnosis not present

## 2016-07-30 DIAGNOSIS — G912 (Idiopathic) normal pressure hydrocephalus: Secondary | ICD-10-CM | POA: Diagnosis not present

## 2016-07-30 DIAGNOSIS — I10 Essential (primary) hypertension: Secondary | ICD-10-CM | POA: Diagnosis not present

## 2016-08-04 DIAGNOSIS — H04123 Dry eye syndrome of bilateral lacrimal glands: Secondary | ICD-10-CM | POA: Diagnosis not present

## 2016-08-04 DIAGNOSIS — H52223 Regular astigmatism, bilateral: Secondary | ICD-10-CM | POA: Diagnosis not present

## 2016-08-04 DIAGNOSIS — H524 Presbyopia: Secondary | ICD-10-CM | POA: Diagnosis not present

## 2016-08-04 DIAGNOSIS — H2513 Age-related nuclear cataract, bilateral: Secondary | ICD-10-CM | POA: Diagnosis not present

## 2016-08-04 DIAGNOSIS — H35363 Drusen (degenerative) of macula, bilateral: Secondary | ICD-10-CM | POA: Diagnosis not present

## 2016-08-04 DIAGNOSIS — H5203 Hypermetropia, bilateral: Secondary | ICD-10-CM | POA: Diagnosis not present

## 2016-08-04 DIAGNOSIS — I10 Essential (primary) hypertension: Secondary | ICD-10-CM | POA: Diagnosis not present

## 2016-08-04 DIAGNOSIS — H3562 Retinal hemorrhage, left eye: Secondary | ICD-10-CM | POA: Diagnosis not present

## 2016-08-15 ENCOUNTER — Ambulatory Visit: Payer: PPO | Admitting: Urology

## 2016-08-20 ENCOUNTER — Encounter
Admission: RE | Admit: 2016-08-20 | Discharge: 2016-08-20 | Disposition: A | Discharge status: Home / Self Care | Payer: PPO | Source: Ambulatory Visit | Attending: Urology | Admitting: Urology

## 2016-08-20 DIAGNOSIS — N135 Crossing vessel and stricture of ureter without hydronephrosis: Secondary | ICD-10-CM | POA: Insufficient documentation

## 2016-08-20 DIAGNOSIS — N130 Hydronephrosis with ureteropelvic junction obstruction: Secondary | ICD-10-CM | POA: Diagnosis not present

## 2016-08-20 MED ORDER — FUROSEMIDE 10 MG/ML IJ SOLN
20.0000 mg | Freq: Once | INTRAMUSCULAR | Status: DC
  Filled 2016-08-20: qty 2

## 2016-08-20 MED ORDER — TECHNETIUM TC 99M MERTIATIDE
5.4500 | Freq: Once | INTRAVENOUS | Status: AC | PRN
  Administered 2016-08-20: 5.45 via INTRAVENOUS

## 2016-08-29 ENCOUNTER — Encounter: Payer: Self-pay | Admitting: Urology

## 2016-08-29 ENCOUNTER — Ambulatory Visit: Payer: PPO | Admitting: Urology

## 2016-08-29 VITALS — BP 174/77 | HR 80 | Ht 63.0 in | Wt 155.0 lb

## 2016-08-29 DIAGNOSIS — N1330 Unspecified hydronephrosis: Secondary | ICD-10-CM | POA: Diagnosis not present

## 2016-08-29 DIAGNOSIS — R3915 Urgency of urination: Secondary | ICD-10-CM

## 2016-08-29 DIAGNOSIS — N3941 Urge incontinence: Secondary | ICD-10-CM | POA: Diagnosis not present

## 2016-08-29 DIAGNOSIS — N135 Crossing vessel and stricture of ureter without hydronephrosis: Secondary | ICD-10-CM | POA: Diagnosis not present

## 2016-08-29 NOTE — Progress Notes (Signed)
08/29/2016 1:45 PM   Abigail Hill 11-Jan-1951 [id number]  Referring provider: Madelyn Brunner, MD [facility] [place], [place] [address]  Chief Complaint  Patient presents with  . Results    6 month w/lasix renogram results    HPI: 66 year old female with left UPJ obstruction status post repair 30 years ago who returns today for annual follow-up.   She the initially presented to the emergency room on 06/13/2015 with a severe urinary tract infection, E. coli. As part of further workup, she underwent a CT stone protocol which revealed marked left hydronephrosis compatible with UPJ obstruction without an obstructing stone.  She was seen and evaluated in our office and underwent a Lasix renogram on 07/23/2015 which was consistent with left UPJ obstruction with decreased split function, 37.5% of of the left kidney.  Follow-up Lasix renogram on 08/20/2016 shows split function of 57% of the left, 43 and the rate with delayed nephrogram on the left significantly with very little to no excretion of tracer. Patient also has impaired perfusion uptake.    She did have surgery on her left kidney 30 years ago by Dr. Redmond Baseman at which time it sounds like she had a UPJ repair (open pyeloplasty).  She has a flank incision on the left.    Since surgery, she's had no issues with flank pain, hematuria, stones or any other urinary issues other than one isolated infection.  Cr stable at 0.9 on 07/2016.   Since her last visit, she has been diagnosed with normal pressure hydrocephalus. She is followed by [facility]. She'll be undergoing a daily spinal top for 3 days in August to see this helps her gait. If it does, she'll undergo VP shunting.    She also reports that she had occasional urgency and urge incontinence episodes over the past year. She now wears a depends just in case. She notes that if she avoids coffee or drinking before she gets in the car, she doesn't have these  episodes. The episodes are infrequent, approximately 1-2 times per month. She denies any significant daytime frequency. She does get up 1-2 times at night to urinate. Her symptoms are exacerbated by inability to get to the bathroom on time due to gait difficulties. Overall, she's not particular bother but her husband is disturbed by this.   PMH: Past Medical History:  Diagnosis Date  . Abnormal gait 12/18/2015  . BP (high blood pressure) 08/24/2015  . Cancer ([place])    skin  . Constriction of ureter (postoperative) 07/28/2015  . Hydronephrosis 07/14/2015  . Hypertension   . Left foot drop 12/18/2015  . Recurrent UTI   . UPJ (ureteropelvic junction) obstruction 07/28/2015  . UTI (lower urinary tract infection) 07/14/2015    Surgical History: Past Surgical History:  Procedure Laterality Date  . broken foot  2012  . kidney repair  1985   repaired torn blood vessel in kidney    Home Medications:  Allergies as of 08/29/2016   No Known Allergies     Medication List       Accurate as of 08/29/16 11:59 PM. Always use your most recent med list.          alendronate 70 MG tablet Commonly known as:  FOSAMAX   calcium-vitamin D 250-125 MG-UNIT tablet Commonly known as:  OSCAL WITH D Take 1 tablet by mouth daily.   CENTRUM SILVER PO Take by mouth.   OCUVITE EYE HEALTH FORMULA PO Take by mouth.  hydrochlorothiazide 12.5 MG tablet Commonly known as:  HYDRODIURIL Take by mouth.       Allergies: No Known Allergies  Family History: Family History  Problem Relation Age of Onset  . Kidney disease Neg Hx   . Bladder Cancer Neg Hx     Social History:  reports that she has quit smoking. She has never used smokeless tobacco. She reports that she drinks alcohol. She reports that she does not use drugs.  ROS: UROLOGY Frequent Urination?: No Hard to postpone urination?: Yes Burning/pain with urination?: No Get up at night to urinate?: No Leakage of urine?: Yes Urine stream  starts and stops?: No Trouble starting stream?: No Do you have to strain to urinate?: No Blood in urine?: No Urinary tract infection?: No Sexually transmitted disease?: No Injury to kidneys or bladder?: No Painful intercourse?: No Weak stream?: No Currently pregnant?: No Vaginal bleeding?: No Last menstrual period?: n  Gastrointestinal Nausea?: No Vomiting?: No Indigestion/heartburn?: No Diarrhea?: No Constipation?: No  Constitutional Fever: No Night sweats?: No Weight loss?: No Fatigue?: No  Skin Skin rash/lesions?: No Itching?: No  Eyes Blurred vision?: No Double vision?: No  Ears/Nose/Throat Sore throat?: No Sinus problems?: No  Hematologic/Lymphatic Swollen glands?: No Easy bruising?: No  Cardiovascular Leg swelling?: No Chest pain?: No  Respiratory Cough?: No Shortness of breath?: No  Endocrine Excessive thirst?: No  Musculoskeletal Back pain?: No Joint pain?: No  Neurological Headaches?: No Dizziness?: No  Psychologic Depression?: No Anxiety?: No  Physical Exam: BP (!) 174/77   Pulse 80   Ht 5\' 3"  (1.6 m)   Wt 155 lb (70.3 kg)   BMI 27.46 kg/m   Constitutional:  Alert and oriented, No acute distress.   HEENT: [place] AT, moist mucus membranes.  Trachea midline, no masses. Cardiovascular: No clubbing, cyanosis, or edema. Respiratory: Normal respiratory effort, no increased work of breathing. GI: Abdomen is soft, nontender, nondistended, no abdominal masses GU: No CVA tenderness. Skin: No rashes, bruises or suspicious lesions. Neurologic: Grossly intact, no focal deficits, moving all 4 extremities.   Ambulating today with somewhat magnetic shuffled gait. Psychiatric: Normal mood and affect.  Laboratory Data: Lab Results  Component Value Date   WBC 13.8 (H) 06/13/2015   HGB 13.4 06/13/2015   HCT 39.6 06/13/2015   MCV 92.0 06/13/2015   PLT 138 (L) 06/13/2015    Lab Results  Component Value Date   CREATININE 0.99 07/10/2015     Pertinent Imaging: CLINICAL DATA:  LEFT UPJ obstruction  EXAM: NUCLEAR MEDICINE RENAL SCAN WITH DIURETIC ADMINISTRATION  TECHNIQUE: Radionuclide angiographic and sequential renal images were obtained after intravenous injection of radiopharmaceutical. Imaging was continued during slow intravenous injection of Lasix approximately 15 minutes after the start of the examination.  RADIOPHARMACEUTICALS:  5.45 mCi Technetium-62m MAG3 IV  Pharmaceutical: Lasix 20 mg IV 20 minutes into the study  COMPARISON:  None.  FINDINGS: Flow: Delayed and diminished blood flow to the LEFT kidney versus RIGHT  Left renogram: Delayed uptake, concentration and excretion of tracer by LEFT kidney. Markedly dilated LEFT renal collecting system which gradually accumulates tracer throughout the exam. Only mild washout of tracer from the LEFT kidney occurs following Lasix. Findings remain compatible with LEFT UPJ obstruction.  Right renogram: Normal uptake, concentration and excretion of tracer by the RIGHT kidney. No significant tracer retention at conclusion of the exam.  Differential:  Left kidney = 57 %  Right kidney = 43 %  T1/2 post Lasix :  Left kidney = not achieved  Right kidney = ~14  min  IMPRESSION: Normal RIGHT renogram.  Impaired perfusion, uptake, concentration and excretion of tracer by the LEFT kidney.  Dilated LEFT renal collecting system with inadequate washout of tracer following diuretic administration compatible with persistent LEFT UPJ obstruction.   Electronically Signed   By: Lavonia Dana M.D.   On: 08/20/2016 14:09  Images were personally reviewed today and also discussed with radiologist. We discussed that I suspect that there is some artifact in assessing overall functionality of the hydronephrotic left kidney. Radiologist agrees and will follow-up on this study, possibly revise report.  Assessment & Plan:    1. UPJ (ureteropelvic  junction) obstruction Chronic UPJ s/p repair 30 years ago with ongoing obstruction resulting in decreased left renal function based on lasix renogram 07/2015.  Repeat Lasix renogram later shows improved function of left kidney, although I suspect this is artifact. Radiology is reviewing imaging and we'll revise as appropriate.   She otherwise  Remains completely symptomatic without recurrent UTIs, stones, flank pain (Dietel's crisis), or any other sequela.  Overall renal function preserved (stable) with normal functioning right kidney.     2. Hydronephrosis, left As above, chronic  3. Urinary urgency/urge incontinence Multifactorial, possibly related to newly diagnosed pressure hydrocephalus. Behavioral modification was discussed at length. We discussed pharmacotherapy and the patient's depression in pursuing this at this time. She will let us know if she becomes more bothered by her symptoms we will discuss further.   Return in about 1 year (around 08/29/2017) for RUS or sooner as needed.  Hollice Espy, MD   [facility] [address], [place], [place] [address] [phone]  I spent 25 min with this patient of which greater than 50% was spent in counseling and coordination of care with the patient.

## 2016-10-21 DIAGNOSIS — R269 Unspecified abnormalities of gait and mobility: Secondary | ICD-10-CM | POA: Diagnosis not present

## 2016-10-21 DIAGNOSIS — Z87891 Personal history of nicotine dependence: Secondary | ICD-10-CM | POA: Diagnosis not present

## 2016-10-21 DIAGNOSIS — N39498 Other specified urinary incontinence: Secondary | ICD-10-CM | POA: Diagnosis not present

## 2016-10-21 DIAGNOSIS — M810 Age-related osteoporosis without current pathological fracture: Secondary | ICD-10-CM | POA: Diagnosis not present

## 2016-10-21 DIAGNOSIS — G912 (Idiopathic) normal pressure hydrocephalus: Secondary | ICD-10-CM | POA: Diagnosis not present

## 2016-10-21 DIAGNOSIS — G9389 Other specified disorders of brain: Secondary | ICD-10-CM | POA: Diagnosis not present

## 2016-10-21 DIAGNOSIS — Z7983 Long term (current) use of bisphosphonates: Secondary | ICD-10-CM | POA: Diagnosis not present

## 2016-10-21 DIAGNOSIS — Z7982 Long term (current) use of aspirin: Secondary | ICD-10-CM | POA: Diagnosis not present

## 2016-10-21 DIAGNOSIS — Z8673 Personal history of transient ischemic attack (TIA), and cerebral infarction without residual deficits: Secondary | ICD-10-CM | POA: Diagnosis not present

## 2016-10-21 DIAGNOSIS — I10 Essential (primary) hypertension: Secondary | ICD-10-CM | POA: Diagnosis not present

## 2016-10-28 ENCOUNTER — Other Ambulatory Visit: Payer: Self-pay

## 2016-10-28 NOTE — Patient Outreach (Signed)
[place] [facility]) Care Management  10/28/2016  Abigail Hill 1951-02-13 [id number]      Transition of Care Referral  Referral Date: 10/28/16 Referral Source: [facility] Discharge Report Date of Admission: unknown Diagnosis:unknown Date of Discharge: unknown Facility: [place]: HTA    Outreach attempt # 1 to patient. Spoke with patient. She voices she is doing fairly well since discharge home. Patient states she was discharged from [facility] on 10/24/16. Patient able to verbalize she was admitted to have lumbar drainage. She reports that she was told that MD's were unable to place lumbar shunt as she had hoped they would be able to do. She voices that she still has some nausea and headaches at times since being home but symptoms managed and controlled. Patient able to verbalize when, how and why to contact MD for changes in condition.  Social: Patient resides in her home along with her supportive spouse. She states she is independent with ADLs/IADLs. She was told at time of discharge not to drive for 6wks so she is relying on spouse to transport her to appts. She voices ongoing issues with left leg weakness. She reports one fall within the last year in April. DME in the hone include cane. However, patient states that her husband has ordered her a walker and is to be delivered tomorrow.  Conditions: Per chart review patient has PMH of HTN, skin CA, hydronephrosis, ureteropelvic junction obstruction. She states that she does not monitor her BP in the home as it is controled at present with med regimen. Patient was hospitalized at [facility] for lumbar drainage. She reports that they got off "two containers of fluids."  Care Coordination Issues: Patient states that she was scheduled to have outpatient PT at facility in [place]. She is unsure of the name. She states someone called from facility this morning to begin process of getting it arranged. patient states that she does not desire  to travel/commute back and forth to [facility] to have therapy sessions and wants to get it moved to somewhere closer. RN CM contacted facility at [phone]. Spoke with office staff there and was advised that patient would have to go through her PCP or ordering physician to have orders changed and therapy relocated closer to her home. Patient made ware of this and states she will call Dr. Jonne Ply office to make this request.   Medications: Patient voice taking five meds. She denies any issues with affording and/or managing meds.  Appointments: Patient saw PCP last in May. She was advised on d/c paperwork to follow up with PCP. RN CM educated patient on importance of PCP f/u after recent hospitalization. patient voiced that she will contact MD office to advise of recent hospital stay and make f/u appt. She has appt with Dr. Signa Kell on 12/05/16.  Consent: [facility] services reviewed and discussed. Patient gave verbal consent for transition of care calls.    Plan: RN CM will notify [facility] administrative assistant of case status. RN CM will make outreach attempt to patient within one week. RN CM will send MD letter to PCP to alert of [facility] involvement. RN CM will send welcome letter/package to patient.   Enzo Montgomery, RN,BSN,CCM [place] Management Telephonic Care Management Coordinator Direct Phone: [phone] Toll Free: [phone] Fax: [phone]

## 2016-10-30 ENCOUNTER — Other Ambulatory Visit: Payer: Self-pay

## 2016-10-30 NOTE — Patient Outreach (Signed)
[place] [facility]) Care Management  10/30/2016  ZULA HOVSEPIAN 12/21/1950 [id number]   Transition of Care    Outreach attempt to patient to f/u from previous call. No answer. RN CM left HIPAA compliant voicemail message along with contact info.     Plan: RN CM will make outreach call to patient within one week if no return call from patient.     Enzo Montgomery, RN,BSN,CCM [place] Management Telephonic Care Management Coordinator Direct Phone: [phone] Toll Free: [phone] Fax: [phone]

## 2016-10-30 NOTE — Patient Outreach (Signed)
[place] [facility]) Care Management  10/30/2016  Abigail Hill Feb 28, 1951 [id number]   Care Coordination   Incoming call from patient. She voices that she did call MD office on yesterday as advised by RN CM. She left a message regarding her therapy issue and is awaiting return call from staff. She voices no other needs or concerns at this time. Advised patient that RN CM would f/u with her next week.     Plan: RN CM will make outreach attempt to patient within one week.    Enzo Montgomery, RN,BSN,CCM [place] Management Telephonic Care Management Coordinator Direct Phone: [phone] Toll Free: [phone] Fax: [phone]

## 2016-11-05 DIAGNOSIS — D485 Neoplasm of uncertain behavior of skin: Secondary | ICD-10-CM | POA: Diagnosis not present

## 2016-11-05 DIAGNOSIS — D2261 Melanocytic nevi of right upper limb, including shoulder: Secondary | ICD-10-CM | POA: Diagnosis not present

## 2016-11-05 DIAGNOSIS — D2272 Melanocytic nevi of left lower limb, including hip: Secondary | ICD-10-CM | POA: Diagnosis not present

## 2016-11-05 DIAGNOSIS — D225 Melanocytic nevi of trunk: Secondary | ICD-10-CM | POA: Diagnosis not present

## 2016-11-05 DIAGNOSIS — D045 Carcinoma in situ of skin of trunk: Secondary | ICD-10-CM | POA: Diagnosis not present

## 2016-11-05 DIAGNOSIS — Z85828 Personal history of other malignant neoplasm of skin: Secondary | ICD-10-CM | POA: Diagnosis not present

## 2016-11-06 ENCOUNTER — Other Ambulatory Visit: Payer: Self-pay

## 2016-11-06 NOTE — Patient Outreach (Signed)
[place] [facility]) Care Management  11/06/2016  Abigail Hill 06/21/1950 [id number]   Transition of Care Call    Outreach attempt to patient. Spoke with spouse who reported patient was not available at this time and advised RN CM to call back later.     Plan: RN CM will make outreach attempt to patient within three business days.    Enzo Montgomery, RN,BSN,CCM [place] Management Telephonic Care Management Coordinator Direct Phone: [phone] Toll Free: [phone] Fax: [phone]

## 2016-11-10 ENCOUNTER — Other Ambulatory Visit: Payer: Self-pay

## 2016-11-10 NOTE — Patient Outreach (Signed)
[place] [facility]) Care Management  11/10/2016  Abigail Hill [birth year] [id number]   Transition of Care   Outreach attempt #2 to patient. No answer at present. RN CM left HIPAA compliant voicemail along with contact info.    Plan: RN CM will make outreach attempt to patient within a week if no return call from patient.     Enzo Montgomery, RN,BSN,CCM [place] Management Telephonic Care Management Coordinator Direct Phone: [phone] Toll Free: [phone] Fax: [phone]

## 2016-11-12 ENCOUNTER — Other Ambulatory Visit: Payer: Self-pay

## 2016-11-12 DIAGNOSIS — D045 Carcinoma in situ of skin of trunk: Secondary | ICD-10-CM | POA: Diagnosis not present

## 2016-11-12 NOTE — Patient Outreach (Signed)
[place] [facility]) Care Management  11/12/2016  Abigail Hill February 01, 1951 [id number]    Transition of Care     Outreach attempt # 3 to patient. No answer at present and unable to leave message.       Plan: RN CM will send unsuccessful outreach letter to patient and close case if no response within 10 business days.   Enzo Montgomery, RN,BSN,CCM [place] Management Telephonic Care Management Coordinator Direct Phone: [phone] Toll Free: [phone] Fax: [phone]

## 2016-11-26 ENCOUNTER — Other Ambulatory Visit: Payer: Self-pay

## 2016-11-26 NOTE — Patient Outreach (Signed)
[facility]) Care Management  11/26/2016  DEMEISHA GERAGHTY [birth year] [id number]     Case Closure   Transition of Care Referral  Referral Date: 10/28/16 Referral Source: [facility] Discharge Report Date of Admission: unknown Diagnosis:unknown Date of Discharge: unknown Facility: [place]: HTA     Multiple attempts to establish contact with patient without success. No response from letter mailed to patient. Case is being closed at this time.      Plan:  RN CM will notify [facility] administrative assistant of case status. RN CM will send MD case closure letter.    Enzo Montgomery, RN,BSN,CCM [place] Management Telephonic Care Management Coordinator Direct Phone: [phone] Toll Free: [phone] Fax: [phone]

## 2017-01-27 DIAGNOSIS — I10 Essential (primary) hypertension: Secondary | ICD-10-CM | POA: Diagnosis not present

## 2017-02-03 DIAGNOSIS — I639 Cerebral infarction, unspecified: Secondary | ICD-10-CM | POA: Diagnosis not present

## 2017-02-03 DIAGNOSIS — Z5181 Encounter for therapeutic drug level monitoring: Secondary | ICD-10-CM | POA: Diagnosis not present

## 2017-02-03 DIAGNOSIS — Z0001 Encounter for general adult medical examination with abnormal findings: Secondary | ICD-10-CM | POA: Diagnosis not present

## 2017-02-03 DIAGNOSIS — I10 Essential (primary) hypertension: Secondary | ICD-10-CM | POA: Diagnosis not present

## 2017-02-03 DIAGNOSIS — M810 Age-related osteoporosis without current pathological fracture: Secondary | ICD-10-CM | POA: Diagnosis not present

## 2017-02-03 DIAGNOSIS — Z1211 Encounter for screening for malignant neoplasm of colon: Secondary | ICD-10-CM | POA: Diagnosis not present

## 2017-02-03 DIAGNOSIS — R269 Unspecified abnormalities of gait and mobility: Secondary | ICD-10-CM | POA: Diagnosis not present

## 2017-03-05 ENCOUNTER — Encounter: Payer: Self-pay | Admitting: Neurology

## 2017-04-16 DIAGNOSIS — Z1211 Encounter for screening for malignant neoplasm of colon: Secondary | ICD-10-CM | POA: Diagnosis not present

## 2017-04-16 DIAGNOSIS — I10 Essential (primary) hypertension: Secondary | ICD-10-CM | POA: Diagnosis not present

## 2017-04-24 DIAGNOSIS — H35363 Drusen (degenerative) of macula, bilateral: Secondary | ICD-10-CM | POA: Diagnosis not present

## 2017-04-24 DIAGNOSIS — H52223 Regular astigmatism, bilateral: Secondary | ICD-10-CM | POA: Diagnosis not present

## 2017-04-24 DIAGNOSIS — H2513 Age-related nuclear cataract, bilateral: Secondary | ICD-10-CM | POA: Diagnosis not present

## 2017-04-24 DIAGNOSIS — H04123 Dry eye syndrome of bilateral lacrimal glands: Secondary | ICD-10-CM | POA: Diagnosis not present

## 2017-04-24 DIAGNOSIS — H524 Presbyopia: Secondary | ICD-10-CM | POA: Diagnosis not present

## 2017-04-24 DIAGNOSIS — H5203 Hypermetropia, bilateral: Secondary | ICD-10-CM | POA: Diagnosis not present

## 2017-04-28 DIAGNOSIS — R0781 Pleurodynia: Secondary | ICD-10-CM | POA: Diagnosis not present

## 2017-05-13 DIAGNOSIS — Z85828 Personal history of other malignant neoplasm of skin: Secondary | ICD-10-CM | POA: Diagnosis not present

## 2017-05-13 DIAGNOSIS — D2272 Melanocytic nevi of left lower limb, including hip: Secondary | ICD-10-CM | POA: Diagnosis not present

## 2017-05-13 DIAGNOSIS — L570 Actinic keratosis: Secondary | ICD-10-CM | POA: Diagnosis not present

## 2017-05-13 DIAGNOSIS — X32XXXA Exposure to sunlight, initial encounter: Secondary | ICD-10-CM | POA: Diagnosis not present

## 2017-05-13 DIAGNOSIS — D225 Melanocytic nevi of trunk: Secondary | ICD-10-CM | POA: Diagnosis not present

## 2017-05-13 DIAGNOSIS — D2261 Melanocytic nevi of right upper limb, including shoulder: Secondary | ICD-10-CM | POA: Diagnosis not present

## 2017-06-16 ENCOUNTER — Encounter: Payer: Self-pay | Admitting: Neurology

## 2017-06-16 ENCOUNTER — Ambulatory Visit (INDEPENDENT_AMBULATORY_CARE_PROVIDER_SITE_OTHER): Payer: PPO | Admitting: Neurology

## 2017-06-16 VITALS — BP 144/84 | HR 88 | Ht 63.0 in | Wt 160.8 lb

## 2017-06-16 DIAGNOSIS — R2681 Unsteadiness on feet: Secondary | ICD-10-CM

## 2017-06-16 DIAGNOSIS — G9389 Other specified disorders of brain: Secondary | ICD-10-CM

## 2017-06-16 DIAGNOSIS — R292 Abnormal reflex: Secondary | ICD-10-CM | POA: Diagnosis not present

## 2017-06-16 NOTE — Patient Instructions (Addendum)
1.  We will look for other causes of balance and walking problems.  We will check MRI of cervical spine to evaluate any pinching of spinal cord. We have sent a referral to [place] for your MRI and they will call you directly to schedule your appt. They are located at [place]. If you need to contact them directly please call [phone].  2.  Further recommendations pending results.  Otherwise, follow up in 3 months.

## 2017-06-16 NOTE — Progress Notes (Signed)
NEUROLOGY CONSULTATION NOTE  Abigail Hill MRN: [id number] DOB: 09-18-1950  Referring provider: Dr. Edwina Barth Primary care provider: Dr. Edwina Barth  Reason for consult:  NPH  HISTORY OF PRESENT ILLNESS: Abigail Hill is a 67 year old right-handed female who presents for normal pressure hydrocephalus.  History supplemented by neurosurgery and former neurologist notes.    Since 2015-2016, she reports problems with balance and gait.  She feels like she leans towards the left and tends to drag her left leg.  There is no associated low back pain, radicular pain, or numbness.  Over time, she started having increased falls.  Around the same time, she started having urinary incontinence.  It would occur while trying to reach the bathroom. She denied bowel dysfunction.  She saw a neurologist in early 2018 and was found to have a foot drop, which was initially trated with an AFO and physical therapy.  A NCV-EMG was ordered but never performed.  She subsequently had an MRI of the brain without contrast on 05/15/16, which was personally reviewed, which showed chronic hydrocephalus as demonstrated by moderate lateral and third ventriculomegaly  with transependymal flow, slightly worse compared to a prior head CT from 04/29/09.  MRI of lumbar spine from 06/19/16 showed sacral insufficiency fractures involving the left ala and S3 body and L5 chronic pars defect with disc degeneration but no nerve root impingement.  She was seen by neurosurgery on 07/03/16, who felt that her symptoms were related to NPH rather than lumbosacral etiology and she was referred for a lumbar drain trial and consideration of VP shunt.  She was admitted to [facility] in August 2018 for lumbar drain trial which was unsuccessful.  There has been no further progression since then.  She has a walker but is able to still ambulate independently.  She denies neck and back pain.  PAST MEDICAL HISTORY: Past Medical History:  Diagnosis Date  . Abnormal gait  12/18/2015  . BP (high blood pressure) 08/24/2015  . Cancer ([place])    skin  . Constriction of ureter (postoperative) 07/28/2015  . Hydronephrosis 07/14/2015  . Hypertension   . Left foot drop 12/18/2015  . Recurrent UTI   . UPJ (ureteropelvic junction) obstruction 07/28/2015  . UTI (lower urinary tract infection) 07/14/2015    PAST SURGICAL HISTORY: Past Surgical History:  Procedure Laterality Date  . broken foot  2012  . kidney repair  1985   repaired torn blood vessel in kidney    MEDICATIONS: Current Outpatient Medications on File Prior to Visit  Medication Sig Dispense Refill  . alendronate (FOSAMAX) 70 MG tablet     . aspirin 81 MG chewable tablet Chew 81 mg by mouth daily.    . calcium-vitamin D (OSCAL WITH D) 250-125 MG-UNIT tablet Take 1 tablet by mouth daily.    . hydrochlorothiazide (HYDRODIURIL) 12.5 MG tablet Take by mouth.    . Multiple Vitamins-Minerals (CENTRUM SILVER PO) Take by mouth.    . Multiple Vitamins-Minerals (OCUVITE EYE HEALTH FORMULA PO) Take by mouth.     No current facility-administered medications on file prior to visit.     ALLERGIES: No Known Allergies  FAMILY HISTORY: Family History  Problem Relation Age of Onset  . Kidney disease Neg Hx   . Bladder Cancer Neg Hx     SOCIAL HISTORY: Social History   Socioeconomic History  . Marital status: Married    Spouse name: Not on file  . Number of children: Not on file  . Years of  education: Not on file  . Highest education level: Not on file  Occupational History  . Not on file  Social Needs  . Financial resource strain: Not on file  . Food insecurity:    Worry: Not on file    Inability: Not on file  . Transportation needs:    Medical: Not on file    Non-medical: Not on file  Tobacco Use  . Smoking status: Former Research scientist (life sciences)  . Smokeless tobacco: Never Used  . Tobacco comment: quit 10 years  Substance and Sexual Activity  . Alcohol use: Yes    Alcohol/week: 0.0 oz  . Drug use: No  .  Sexual activity: Yes  Lifestyle  . Physical activity:    Days per week: Not on file    Minutes per session: Not on file  . Stress: Not on file  Relationships  . Social connections:    Talks on phone: Not on file    Gets together: Not on file    Attends religious service: Not on file    Active member of club or organization: Not on file    Attends meetings of clubs or organizations: Not on file    Relationship status: Not on file  . Intimate partner violence:    Fear of current or ex partner: Not on file    Emotionally abused: Not on file    Physically abused: Not on file    Forced sexual activity: Not on file  Other Topics Concern  . Not on file  Social History Narrative  . Not on file    REVIEW OF SYSTEMS: Constitutional: No fevers, chills, or sweats, no generalized fatigue, change in appetite Eyes: No visual changes, double vision, eye pain Ear, nose and throat: No hearing loss, ear pain, nasal congestion, sore throat Cardiovascular: No chest pain, palpitations Respiratory:  No shortness of breath at rest or with exertion, wheezes GastrointestinaI: No nausea, vomiting, diarrhea, abdominal pain, fecal incontinence Genitourinary:  No dysuria, urinary retention or frequency Musculoskeletal:  No neck pain, back pain Integumentary: No rash, pruritus, skin lesions Neurological: as above Psychiatric: No depression, insomnia, anxiety Endocrine: No palpitations, fatigue, diaphoresis, mood swings, change in appetite, change in weight, increased thirst Hematologic/Lymphatic:  No purpura, petechiae. Allergic/Immunologic: no itchy/runny eyes, nasal congestion, recent allergic reactions, rashes  PHYSICAL EXAM: Vitals:   06/16/17 1003 06/16/17 1004  BP: (!) 144/84 (!) 144/84  Pulse: 88 88  SpO2: 91% 91%   General: No acute distress.  Patient appears well-groomed.  Head:  Normocephalic/atraumatic Eyes:  fundi examined but not visualized Neck: supple, no paraspinal tenderness,  full range of motion Back: No paraspinal tenderness Heart: regular rate and rhythm Lungs: Clear to auscultation bilaterally. Vascular: No carotid bruits. Neurological Exam: Mental status: alert and oriented to person, place, and time, recent and remote memory intact, fund of knowledge intact, attention and concentration intact, speech fluent and not dysarthric, language intact. Cranial nerves: CN I: not tested CN II: pupils equal, round and reactive to light, visual fields intact CN III, IV, VI:  full range of motion, no nystagmus, no ptosis CN V: facial sensation intact CN VII: upper and lower face symmetric CN VIII: hearing intact CN IX, X: gag intact, uvula midline CN XI: sternocleidomastoid and trapezius muscles intact CN XII: tongue midline Bulk & Tone: normal, no fasciculations. Motor:  5/5 throughout Sensation:  Pinprick and vibration sensation reduced in feet. Deep Tendon Reflexes:  3+ in patellars bilaterally, otherwise 2+ throughout, toes downgoing. Finger to nose testing:  Without dysmetria. Gait:  Wide-based waddling gait with slight drag of left leg.  Requires several steps to turn.  Unable to tandem walk.  Romberg positive.  IMPRESSION: Unsteady gait Cerebral ventriculomegaly  NPH unlikely given the negative lumbar drain trial.  Also, her gait abnormality is not characteristic for NPH.  Incontinence occurs while trying to walk to the bathroom, so she may have incontinence because she cannot get to the bathroom on time.  She does not exhibit any parkinsonian symptoms.  She does exhibit slight hyperreflexia in the patellars, which is of uncertain clinical significance given lack of other symptoms suggesting myelopathy.  However, I would like to evaluate for cervical stenosis causing myelopathy.  PLAN: 1.  MRI of cervical spine to evaluate for cord impingement.  Further recommendations pending results.  Otherwise, follow up in 3 months.  Thank you for allowing me to take  part in the care of this patient.  Metta Clines, DO  CC:  Harrel Lemon, MD

## 2017-06-28 ENCOUNTER — Ambulatory Visit
Admission: RE | Admit: 2017-06-28 | Discharge: 2017-06-28 | Disposition: A | Discharge status: Home / Self Care | Payer: PPO | Source: Ambulatory Visit | Attending: Neurology | Admitting: Neurology

## 2017-06-28 DIAGNOSIS — R292 Abnormal reflex: Secondary | ICD-10-CM

## 2017-06-28 DIAGNOSIS — R2681 Unsteadiness on feet: Secondary | ICD-10-CM

## 2017-06-28 DIAGNOSIS — M4802 Spinal stenosis, cervical region: Secondary | ICD-10-CM | POA: Diagnosis not present

## 2017-06-29 ENCOUNTER — Telehealth: Payer: Self-pay

## 2017-06-29 ENCOUNTER — Encounter: Payer: Self-pay | Admitting: Student

## 2017-06-29 NOTE — Telephone Encounter (Signed)
-----   Message from Pieter Partridge, DO sent at 06/29/2017 10:26 AM EDT ----- There is degenerative disc disease in the cervical spine but no pressing on the spinal cord that would be causing problems with walking.

## 2017-06-29 NOTE — Telephone Encounter (Signed)
Called and spoke with [name], advsd him of results. He asked I talk with Dr Thressa Sheller and determine is there anything else to do prior to appt in July. Pt has colonoscopy scheduled tomorrow morning, Abigail Hill asked I wait to return call till Wednesday.

## 2017-06-30 ENCOUNTER — Ambulatory Visit
Admission: RE | Admit: 2017-06-30 | Discharge: 2017-06-30 | Disposition: A | Discharge status: Home / Self Care | Payer: PPO | Source: Ambulatory Visit | Attending: Gastroenterology | Admitting: Gastroenterology

## 2017-06-30 ENCOUNTER — Ambulatory Visit: Payer: PPO | Admitting: Anesthesiology

## 2017-06-30 ENCOUNTER — Encounter: Payer: Self-pay | Admitting: Anesthesiology

## 2017-06-30 ENCOUNTER — Encounter
Admission: RE | Disposition: A | Discharge status: Home / Self Care | Payer: Self-pay | Source: Ambulatory Visit | Attending: Gastroenterology

## 2017-06-30 DIAGNOSIS — K5730 Diverticulosis of large intestine without perforation or abscess without bleeding: Secondary | ICD-10-CM | POA: Insufficient documentation

## 2017-06-30 DIAGNOSIS — K621 Rectal polyp: Secondary | ICD-10-CM | POA: Insufficient documentation

## 2017-06-30 DIAGNOSIS — K635 Polyp of colon: Secondary | ICD-10-CM | POA: Diagnosis not present

## 2017-06-30 DIAGNOSIS — Z79899 Other long term (current) drug therapy: Secondary | ICD-10-CM | POA: Insufficient documentation

## 2017-06-30 DIAGNOSIS — D128 Benign neoplasm of rectum: Secondary | ICD-10-CM | POA: Diagnosis not present

## 2017-06-30 DIAGNOSIS — D125 Benign neoplasm of sigmoid colon: Secondary | ICD-10-CM | POA: Diagnosis not present

## 2017-06-30 DIAGNOSIS — Z1211 Encounter for screening for malignant neoplasm of colon: Secondary | ICD-10-CM | POA: Insufficient documentation

## 2017-06-30 DIAGNOSIS — Z7982 Long term (current) use of aspirin: Secondary | ICD-10-CM | POA: Diagnosis not present

## 2017-06-30 DIAGNOSIS — I10 Essential (primary) hypertension: Secondary | ICD-10-CM | POA: Diagnosis not present

## 2017-06-30 DIAGNOSIS — Z885 Allergy status to narcotic agent status: Secondary | ICD-10-CM | POA: Diagnosis not present

## 2017-06-30 DIAGNOSIS — K5790 Diverticulosis of intestine, part unspecified, without perforation or abscess without bleeding: Secondary | ICD-10-CM | POA: Diagnosis not present

## 2017-06-30 DIAGNOSIS — Z87891 Personal history of nicotine dependence: Secondary | ICD-10-CM | POA: Diagnosis not present

## 2017-06-30 HISTORY — PX: COLONOSCOPY WITH PROPOFOL: SHX5780

## 2017-06-30 SURGERY — COLONOSCOPY WITH PROPOFOL
Anesthesia: General

## 2017-06-30 MED ORDER — LIDOCAINE HCL (CARDIAC) PF 100 MG/5ML IV SOSY
PREFILLED_SYRINGE | INTRAVENOUS | Status: DC | PRN
  Administered 2017-06-30: 50 mg via INTRAVENOUS

## 2017-06-30 MED ORDER — SODIUM CHLORIDE 0.9 % IV SOLN
INTRAVENOUS | Status: DC
  Administered 2017-06-30: 1000 mL via INTRAVENOUS

## 2017-06-30 MED ORDER — PROPOFOL 500 MG/50ML IV EMUL
INTRAVENOUS | Status: AC
  Filled 2017-06-30: qty 50

## 2017-06-30 MED ORDER — LIDOCAINE HCL (PF) 2 % IJ SOLN
INTRAMUSCULAR | Status: AC
  Filled 2017-06-30: qty 10

## 2017-06-30 MED ORDER — PROPOFOL 10 MG/ML IV BOLUS
INTRAVENOUS | Status: DC | PRN
  Administered 2017-06-30: 18 mg via INTRAVENOUS
  Administered 2017-06-30: 100 mg via INTRAVENOUS

## 2017-06-30 MED ORDER — SODIUM CHLORIDE 0.9 % IV SOLN: INTRAVENOUS | Status: DC

## 2017-06-30 MED ORDER — PROPOFOL 500 MG/50ML IV EMUL
INTRAVENOUS | Status: DC | PRN
  Administered 2017-06-30: 140 ug/kg/min via INTRAVENOUS

## 2017-06-30 NOTE — Anesthesia Postprocedure Evaluation (Signed)
Anesthesia Post Note  Patient: Abigail Hill  Procedure(s) Performed: COLONOSCOPY WITH PROPOFOL (N/A )  Patient location during evaluation: Endoscopy Anesthesia Type: General Level of consciousness: awake and alert Pain management: pain level controlled Vital Signs Assessment: post-procedure vital signs reviewed and stable Respiratory status: spontaneous breathing, nonlabored ventilation, respiratory function stable and patient connected to nasal cannula oxygen Cardiovascular status: blood pressure returned to baseline and stable Postop Assessment: no apparent nausea or vomiting Anesthetic complications: no     Last Vitals:  Vitals:   06/30/17 0806 06/30/17 1006  BP: (!) 156/81 114/74  Pulse: 90   Resp: 20 (!) 23  Temp: 36.7 C (!) 36.2 C  SpO2: 96%     Last Pain:  Vitals:   06/30/17 1026  TempSrc:   PainSc: 0-No pain                 Martha Clan

## 2017-06-30 NOTE — Telephone Encounter (Signed)
I would like to check NCV-EMG of lower extremities for numbness in feet and unsteady gait.

## 2017-06-30 NOTE — H&P (Signed)
Outpatient short stay form Pre-procedure 06/30/2017 8:47 AM Lollie Sails MD  Primary Physician: Harrel Lemon, MD  Reason for visit: Colonoscopy  History of present illness: Patient is a 68 year old female presenting today as above.  This is her first colonoscopy.  She tolerated her prep well.  She takes 81 mg aspirin that has been held for several days.  She takes no other aspirin products or blood thinning agent.    Current Facility-Administered Medications:  .  0.9 %  sodium chloride infusion, , Intravenous, Continuous, Lollie Sails, MD, Last Rate: 20 mL/hr at 06/30/17 0817, 1,000 mL at 06/30/17 0817 .  0.9 %  sodium chloride infusion, , Intravenous, Continuous, Lollie Sails, MD  Medications Prior to Admission  Medication Sig Dispense Refill Last Dose  . alendronate (FOSAMAX) 70 MG tablet    Past Week at Unknown time  . aspirin 81 MG chewable tablet Chew 81 mg by mouth daily.   Past Week at Unknown time  . calcium-vitamin D (OSCAL WITH D) 250-125 MG-UNIT tablet Take 1 tablet by mouth daily.   Past Week at Unknown time  . Multiple Vitamins-Minerals (CENTRUM SILVER PO) Take by mouth.   Past Week at Unknown time  . Multiple Vitamins-Minerals (OCUVITE EYE HEALTH FORMULA PO) Take by mouth.   Past Week at Unknown time  . hydrochlorothiazide (HYDRODIURIL) 12.5 MG tablet Take by mouth.   Taking     Allergies  Allergen Reactions  . Oxycodone      Past Medical History:  Diagnosis Date  . Abnormal gait 12/18/2015  . BP (high blood pressure) 08/24/2015  . Cancer ([place])    skin  . Constriction of ureter (postoperative) 07/28/2015  . Hydronephrosis 07/14/2015  . Hypertension   . Left foot drop 12/18/2015  . Recurrent UTI   . UPJ (ureteropelvic junction) obstruction 07/28/2015  . UTI (lower urinary tract infection) 07/14/2015    Review of systems:      Physical Exam    Heart and lungs: Regular rate and rhythm without rub or gallop, lungs are bilaterally clear.   HEENT: Normocephalic atraumatic eyes are anicteric    Other:    Pertinant exam for procedure: Soft nontender nondistended bowel sounds positive normoactive.  Some protuberant.    Planned proceedures: Colonoscopy and indicated procedures. I have discussed the risks benefits and complications of procedures to include not limited to bleeding, infection, perforation and the risk of sedation and the patient wishes to proceed.    Lollie Sails, MD Gastroenterology 06/30/2017  8:47 AM

## 2017-06-30 NOTE — Anesthesia Preprocedure Evaluation (Signed)
Anesthesia Evaluation  Patient identified by MRN, date of birth, ID band Patient awake    Reviewed: Allergy & Precautions, H&P , NPO status , Patient's Chart, lab work & pertinent test results, reviewed documented beta blocker date and time   History of Anesthesia Complications Negative for: history of anesthetic complications  Airway Mallampati: II  TM Distance: >3 FB Neck ROM: full    Dental  (+) Dental Advidsory Given, Partial Upper   Pulmonary neg pulmonary ROS, former smoker,           Cardiovascular Exercise Tolerance: Good hypertension, (-) angina(-) CAD, (-) Past MI, (-) Cardiac Stents and (-) CABG (-) dysrhythmias (-) Valvular Problems/Murmurs     Neuro/Psych negative neurological ROS  negative psych ROS   GI/Hepatic negative GI ROS, Neg liver ROS,   Endo/Other  negative endocrine ROS  Renal/GU negative Renal ROS  negative genitourinary   Musculoskeletal   Abdominal   Peds  Hematology negative hematology ROS (+)   Anesthesia Other Findings Past Medical History: 12/18/2015: Abnormal gait 08/24/2015: BP (high blood pressure) No date: Cancer [facility])     Comment:  skin 07/28/2015: Constriction of ureter (postoperative) 07/14/2015: Hydronephrosis No date: Hypertension 12/18/2015: Left foot drop No date: Recurrent UTI 07/28/2015: UPJ (ureteropelvic junction) obstruction 07/14/2015: UTI (lower urinary tract infection)   Reproductive/Obstetrics negative OB ROS                             Anesthesia Physical Anesthesia Plan  ASA: II  Anesthesia Plan: General   Post-op Pain Management:    Induction: Intravenous  PONV Risk Score and Plan: 3 and Propofol infusion  Airway Management Planned: Natural Airway and Nasal Cannula  Additional Equipment:   Intra-op Plan:   Post-operative Plan:   Informed Consent: I have reviewed the patients History and Physical, chart, labs and  discussed the procedure including the risks, benefits and alternatives for the proposed anesthesia with the patient or authorized representative who has indicated his/her understanding and acceptance.   Dental Advisory Given  Plan Discussed with: Anesthesiologist, CRNA and Surgeon  Anesthesia Plan Comments:         Anesthesia Quick Evaluation

## 2017-06-30 NOTE — Anesthesia Post-op Follow-up Note (Signed)
Anesthesia QCDR form completed.        

## 2017-06-30 NOTE — Op Note (Signed)
[facility] Gastroenterology Patient Name: Abigail Hill Procedure Date: 06/30/2017 8:57 AM MRN: [id number] Account #: [id number] Date of Birth: March 30, 1950 Admit Type: Outpatient Age: 67 Room: [facility] ENDO ROOM 3 Gender: Female Note Status: Finalized Procedure:            Colonoscopy Indications:          Screening for colorectal malignant neoplasm, This is                        the patient's first colonoscopy Providers:            Lollie Sails, MD Referring MD:         Baxter Hire, MD (Referring MD) Medicines:            Monitored Anesthesia Care Complications:        No immediate complications. Procedure:            Pre-Anesthesia Assessment:                       - ASA Grade Assessment: II - A patient with mild                        systemic disease.                       After obtaining informed consent, the colonoscope was                        passed under direct vision. Throughout the procedure,                        the patient's blood pressure, pulse, and oxygen                        saturations were monitored continuously. The Olympus                        PCF-H180AL colonoscope ( S#: [id number] ) was introduced                        through the anus and advanced to the the cecum,                        identified by appendiceal orifice and ileocecal valve.                        The colonoscopy was extremely difficult due to a                        tortuous colon. Successful completion of the procedure                        was aided by changing the patient to a supine position,                        changing the patient to a prone position and using                        manual pressure. The patient tolerated the procedure  well. The quality of the bowel preparation was fair. Findings:      Multiple medium-mouthed diverticula were found in the sigmoid colon,       descending colon, transverse colon and ascending  colon.      Two sessile polyps were found in the sigmoid colon. The polyps were 1 to       2 mm in size. These polyps were removed with a cold biopsy forceps.       Resection and retrieval were complete.      Two sessile polyps were found in the rectum. The polyps were 3 mm in       size. These polyps were removed with a cold snare. Resection and       retrieval were complete.      The digital rectal exam was normal. Impression:           - Preparation of the colon was fair.                       - Diverticulosis in the sigmoid colon, in the                        descending colon, in the transverse colon and in the                        ascending colon.                       - Two 1 to 2 mm polyps in the sigmoid colon, removed                        with a cold biopsy forceps. Resected and retrieved.                       - Two 3 mm polyps in the rectum, removed with a cold                        snare. Resected and retrieved. Recommendation:       - Discharge patient to home.                       - Await pathology results.                       - Telephone GI clinic for pathology results in 1 week. Procedure Code(s):    --- Professional ---                       [phone], Colonoscopy, flexible; with removal of tumor(s),                        polyp(s), or other lesion(s) by snare technique                       45380, 64, Colonoscopy, flexible; with biopsy, single                        or multiple Diagnosis Code(s):    --- Professional ---                       Z12.11, Encounter for screening for  malignant neoplasm                        of colon                       D12.5, Benign neoplasm of sigmoid colon                       K62.1, Rectal polyp                       K57.30, Diverticulosis of large intestine without                        perforation or abscess without bleeding CPT copyright 2017 American Medical Association. All rights reserved. The codes documented in this  report are preliminary and upon coder review may  be revised to meet current compliance requirements. Lollie Sails, MD 06/30/2017 10:01:10 AM This report has been signed electronically. Number of Addenda: 0 Note Initiated On: 06/30/2017 8:57 AM Scope Withdrawal Time: 0 hours 14 minutes 13 seconds  Total Procedure Duration: 0 hours 55 minutes 21 seconds       [facility]

## 2017-06-30 NOTE — Transfer of Care (Signed)
Immediate Anesthesia Transfer of Care Note  Patient: Abigail Hill  Procedure(s) Performed: COLONOSCOPY WITH PROPOFOL (N/A )  Patient Location: PACU and Endoscopy Unit  Anesthesia Type:General  Level of Consciousness: awake, alert , oriented and patient cooperative  Airway & Oxygen Therapy: Patient Spontanous Breathing and Patient connected to nasal cannula oxygen  Post-op Assessment: Report given to RN and Post -op Vital signs reviewed and stable  Post vital signs: Reviewed and stable  Last Vitals:  Vitals Value Taken Time  BP    Temp    Pulse 75 06/30/2017 10:05 AM  Resp 26 06/30/2017 10:05 AM  SpO2 100 % 06/30/2017 10:05 AM  Vitals shown include unvalidated device data.  Last Pain:  Vitals:   06/30/17 1006  TempSrc: (P) Tympanic  PainSc:          Complications: No apparent anesthesia complications

## 2017-07-01 LAB — SURGICAL PATHOLOGY

## 2017-07-02 ENCOUNTER — Encounter: Payer: Self-pay | Admitting: Gastroenterology

## 2017-07-17 ENCOUNTER — Other Ambulatory Visit: Payer: Self-pay | Admitting: Radiology

## 2017-07-17 DIAGNOSIS — N1330 Unspecified hydronephrosis: Secondary | ICD-10-CM

## 2017-07-30 DIAGNOSIS — G8929 Other chronic pain: Secondary | ICD-10-CM | POA: Diagnosis not present

## 2017-07-30 DIAGNOSIS — M25551 Pain in right hip: Secondary | ICD-10-CM | POA: Diagnosis not present

## 2017-08-08 DIAGNOSIS — R350 Frequency of micturition: Secondary | ICD-10-CM | POA: Diagnosis not present

## 2017-08-18 ENCOUNTER — Ambulatory Visit
Admission: RE | Admit: 2017-08-18 | Discharge: 2017-08-18 | Disposition: A | Discharge status: Home / Self Care | Payer: PPO | Source: Ambulatory Visit | Attending: Urology | Admitting: Urology

## 2017-08-18 DIAGNOSIS — N1330 Unspecified hydronephrosis: Secondary | ICD-10-CM | POA: Diagnosis not present

## 2017-08-18 DIAGNOSIS — N1339 Other hydronephrosis: Secondary | ICD-10-CM | POA: Diagnosis not present

## 2017-09-01 ENCOUNTER — Ambulatory Visit: Payer: PPO | Admitting: Urology

## 2017-09-01 ENCOUNTER — Encounter: Payer: Self-pay | Admitting: Urology

## 2017-09-01 VITALS — BP 147/85 | HR 80 | Ht 63.0 in | Wt 162.4 lb

## 2017-09-01 DIAGNOSIS — N135 Crossing vessel and stricture of ureter without hydronephrosis: Secondary | ICD-10-CM | POA: Diagnosis not present

## 2017-09-01 DIAGNOSIS — R3915 Urgency of urination: Secondary | ICD-10-CM | POA: Diagnosis not present

## 2017-09-01 NOTE — Progress Notes (Signed)
09/01/2017 2:59 PM   Abigail Hill 1950/07/20 [id number]  Referring provider: Madelyn Brunner, MD No address on file  Chief Complaint  Patient presents with  . Follow-up    RUS results    HPI: 67 year old female with chronic left UPJ obstruction who returns today for routine annual follow-up.   Most recent Lasix renogram on 08/20/2016 shows split function of 57% of the left, 43 and the rate with delayed nephrogram on the left significantly with very little to no excretion of tracer. Patient also has impaired perfusion uptake.    She did have surgery on her left kidney 30 years ago by Dr. Redmond Baseman at which time it sounds like she had a UPJ repair (open pyeloplasty).  She has a flank incision on the left.    Since surgery, she's had no issues with flank pain, hematuria, stones or any other urinary issues other than one isolated infection.   Cr remains stable at 0.9 01/2017.   Over the last year, does appear that she has had one urinary tract infection on 08/13/2017.  This grew 100,000 colonies of Klebsiella pneumonia as well as E. coli.  Prior to this, her most recent infection was in 2017.  Symptoms included pelvic pain and urethral discomfort.  No fevers or chills.    She is still having some urinary frequency or surgery related to NPH and mobitity.  She has to watch what she drinks.     PMH: Past Medical History:  Diagnosis Date  . Abnormal gait 12/18/2015  . BP (high blood pressure) 08/24/2015  . Cancer ([place])    skin  . Constriction of ureter (postoperative) 07/28/2015  . Hydronephrosis 07/14/2015  . Hypertension   . Left foot drop 12/18/2015  . Recurrent UTI   . UPJ (ureteropelvic junction) obstruction 07/28/2015  . UTI (lower urinary tract infection) 07/14/2015    Surgical History: Past Surgical History:  Procedure Laterality Date  . broken foot  2012  . COLONOSCOPY WITH PROPOFOL N/A 06/30/2017   Procedure: COLONOSCOPY WITH PROPOFOL;  Surgeon: Lollie Sails,  MD;  Location: [facility] ENDOSCOPY;  Service: Endoscopy;  Laterality: N/A;  . kidney repair  1985   repaired torn blood vessel in kidney    Home Medications:  Allergies as of 09/01/2017      Reactions   Oxycodone       Medication List        Accurate as of 09/01/17 11:59 PM. Always use your most recent med list.          alendronate 70 MG tablet Commonly known as:  FOSAMAX   aspirin 81 MG chewable tablet Chew 81 mg by mouth daily.   calcium-vitamin D 250-125 MG-UNIT tablet Commonly known as:  OSCAL WITH D Take 1 tablet by mouth daily.   CENTRUM SILVER PO Take by mouth.   hydrochlorothiazide 12.5 MG tablet Commonly known as:  HYDRODIURIL Take by mouth.       Allergies:  Allergies  Allergen Reactions  . Oxycodone     Family History: Family History  Problem Relation Age of Onset  . Kidney disease Neg Hx   . Bladder Cancer Neg Hx     Social History:  reports that she has quit smoking. She has never used smokeless tobacco. She reports that she drinks alcohol. She reports that she does not use drugs.  ROS: UROLOGY Frequent Urination?: No Hard to postpone urination?: No Burning/pain with urination?: No Get up at night to urinate?: Yes Leakage  of urine?: Yes Urine stream starts and stops?: No Trouble starting stream?: No Do you have to strain to urinate?: No Blood in urine?: No Urinary tract infection?: No Sexually transmitted disease?: No Injury to kidneys or bladder?: No Painful intercourse?: No Weak stream?: No Currently pregnant?: No Vaginal bleeding?: No Last menstrual period?: n  Gastrointestinal Nausea?: No Vomiting?: No Indigestion/heartburn?: No Diarrhea?: No Constipation?: No  Constitutional Fever: No Night sweats?: No Weight loss?: No Fatigue?: No  Skin Skin rash/lesions?: No Itching?: No  Eyes Blurred vision?: No Double vision?: No  Ears/Nose/Throat Sore throat?: No Sinus problems?: No  Hematologic/Lymphatic Swollen  glands?: No Easy bruising?: No  Cardiovascular Leg swelling?: Yes Chest pain?: No  Respiratory Cough?: No Shortness of breath?: No  Endocrine Excessive thirst?: No  Musculoskeletal Back pain?: No Joint pain?: No  Neurological Headaches?: No Dizziness?: No  Psychologic Depression?: No Anxiety?: No  Physical Exam: BP (!) 147/85 (BP Location: Left Arm, Patient Position: Sitting, Cuff Size: Normal)   Pulse 80   Ht 5\' 3"  (1.6 m)   Wt 162 lb 6.4 oz (73.7 kg)   BMI 28.77 kg/m   Constitutional:  Alert and oriented, No acute distress. HEENT: [place] AT, moist mucus membranes.  Trachea midline, no masses. Cardiovascular: No clubbing, cyanosis, or edema. Respiratory: Normal respiratory effort, no increased work of breathing. GI: Abdomen is soft, nontender, nondistended, no abdominal masses. Skin: No rashes, bruises or suspicious lesions. Neurologic: Grossly intact, no focal deficits, moving all 4 extremities. Magetic gait.  Psychiatric: Normal mood and affect.  Laboratory Data: As above  Urinalysis N/a  Pertinent Imaging: Results for orders placed during the hospital encounter of 08/18/17  US RENAL   Narrative CLINICAL DATA:  Left hydronephrosis.  EXAM: RENAL / URINARY TRACT ULTRASOUND COMPLETE  COMPARISON:  Nuclear medicine renal scan 08/20/2016.  CT 06/13/2015.  FINDINGS: Right Kidney:  Length: 10.6 cm. Echogenicity within normal limits. No mass or hydronephrosis visualized. Stable prominent extrarenal pelvis, no change from prior studies.  Left Kidney:  Length: 11.0 cm. Echogenicity within normal limits. No mass. Severe left hydronephrosis again noted compatible with previously identified left UPJ obstruction.  Bladder:  Appears normal for degree of bladder distention.  IMPRESSION: 1. Severe left hydronephrosis consistent with previously identified left UPJ obstruction. Similar findings noted on prior exams.  2. Right kidney is unremarkable. Stable  prominent extra renal pelvis on the right again noted.   Electronically Signed   By: Marcello Moores  Register   On: 08/18/2017 16:56    Renal ultrasound personally reviewed today.  She has persistent severe left hydronephrosis which is essentially unchanged.  Overall, the parenchyma appears to be reasonably stable compared to previous scans.  Assessment & Plan:    1. UPJ (ureteropelvic junction) obstruction As per previous discussions, she is opted for conservative management She continues to prefer observation rather than surgical intervention with the understanding that she will continue to lose some overall functional left side (prev 43% on renogram 08/2016) Her overall renal function remains stable Overall, her parenchyma is thinned but relatively stable We will continue to follow her with serial imaging She is otherwise asymptomatic - US RENAL; Future  2. Urinary urgency Urinary frequency urgency likely related to underlying normal pressure hydrocephalus Primarily she mentioned today that she has issues with her gait and getting to the bathroom in time due to mobility issues We discussed behavioral modification She was offered pharmacotherapy today and samples but ultimately declined  Return in about 1 year (around 09/02/2018) for RUS, PVR,  bladder symptom check.  Hollice Espy, MD  [facility] [address], [place],  [address] [phone]  I spent 25 min with this patient of which greater than 50% was spent in counseling and coordination of care with the patient.

## 2017-09-15 DIAGNOSIS — Z5181 Encounter for therapeutic drug level monitoring: Secondary | ICD-10-CM | POA: Diagnosis not present

## 2017-09-22 DIAGNOSIS — I639 Cerebral infarction, unspecified: Secondary | ICD-10-CM | POA: Diagnosis not present

## 2017-09-22 DIAGNOSIS — G8929 Other chronic pain: Secondary | ICD-10-CM | POA: Diagnosis not present

## 2017-09-22 DIAGNOSIS — G912 (Idiopathic) normal pressure hydrocephalus: Secondary | ICD-10-CM | POA: Diagnosis not present

## 2017-09-22 DIAGNOSIS — M810 Age-related osteoporosis without current pathological fracture: Secondary | ICD-10-CM | POA: Diagnosis not present

## 2017-09-22 DIAGNOSIS — Z0000 Encounter for general adult medical examination without abnormal findings: Secondary | ICD-10-CM | POA: Diagnosis not present

## 2017-09-22 DIAGNOSIS — M25551 Pain in right hip: Secondary | ICD-10-CM | POA: Diagnosis not present

## 2017-09-22 DIAGNOSIS — I10 Essential (primary) hypertension: Secondary | ICD-10-CM | POA: Diagnosis not present

## 2017-09-22 DIAGNOSIS — Z13220 Encounter for screening for lipoid disorders: Secondary | ICD-10-CM | POA: Diagnosis not present

## 2017-09-30 ENCOUNTER — Encounter: Payer: Self-pay | Admitting: Neurology

## 2017-09-30 ENCOUNTER — Ambulatory Visit (INDEPENDENT_AMBULATORY_CARE_PROVIDER_SITE_OTHER): Payer: PPO | Admitting: Neurology

## 2017-09-30 VITALS — BP 144/94 | HR 90 | Ht 63.0 in | Wt 162.0 lb

## 2017-09-30 DIAGNOSIS — G9389 Other specified disorders of brain: Secondary | ICD-10-CM | POA: Diagnosis not present

## 2017-09-30 DIAGNOSIS — R2681 Unsteadiness on feet: Secondary | ICD-10-CM | POA: Diagnosis not present

## 2017-09-30 NOTE — Patient Instructions (Signed)
1.  We will check nerve conduction study of both legs.  Further recommendations pending results.   2.  Follow up after testing.

## 2017-09-30 NOTE — Progress Notes (Signed)
NEUROLOGY FOLLOW UP OFFICE NOTE  Abigail Hill [id number]  HISTORY OF PRESENT ILLNESS: Abigail Hill is a 67 year old right-handed female who follows up for unsteady gait.  She is accompanied   UPDATE: MRI of cervical spine from 06/28/17 was personally reviewed and showed multilevel degenerative changes with mild spinal stenosis from C3-4 to C5-6 without abnormal cord signal or significant mass effect.  Her gait has been worse.  She has had increased falls.  She reports trouble moving her left leg.  She sometimes has to think about it in order for it to move.  When she enters the car on the passenger side, she needs to lift her right leg with her hands to bring it into the car.  She has a pain in her right anterior thigh which may have started after a fall.  Recent X-ray of the hip was unremarkable.   HISTORY: Since 2015-2016, she reports problems with balance and gait.  She feels like she leans towards the left and tends to drag her left leg.  There is no associated low back pain, radicular pain, or numbness.  Over time, she started having increased falls.  Around the same time, she started having urinary incontinence.  It would occur while trying to reach the bathroom. She denied bowel dysfunction.  She saw a neurologist in early 2018 and was found to have a foot drop, which was initially trated with an AFO and physical therapy.  A NCV-EMG was ordered but never performed.  She subsequently had an MRI of the brain without contrast on 05/15/16, which was personally reviewed, which showed chronic hydrocephalus as demonstrated by moderate lateral and third ventriculomegaly  with transependymal flow, slightly worse compared to a prior head CT from 04/29/09.  MRI of lumbar spine from 06/19/16 showed sacral insufficiency fractures involving the left ala and S3 body and L5 chronic pars defect with disc degeneration but no nerve root impingement.  She was seen by neurosurgery on 07/03/16, who felt that her symptoms  were related to NPH rather than lumbosacral etiology and she was referred for a lumbar drain trial and consideration of VP shunt.  She was admitted to [facility] in August 2018 for lumbar drain trial which was unsuccessful.  There has been no further progression since then.  She has a walker but is able to still ambulate independently.  She denies neck and back pain.  PAST MEDICAL HISTORY: Past Medical History:  Diagnosis Date  . Abnormal gait 12/18/2015  . BP (high blood pressure) 08/24/2015  . Cancer ([place])    skin  . Constriction of ureter (postoperative) 07/28/2015  . Hydronephrosis 07/14/2015  . Hypertension   . Left foot drop 12/18/2015  . Recurrent UTI   . UPJ (ureteropelvic junction) obstruction 07/28/2015  . UTI (lower urinary tract infection) 07/14/2015    MEDICATIONS: Current Outpatient Medications on File Prior to Visit  Medication Sig Dispense Refill  . calcium carbonate (OSCAL) 1500 (600 Ca) MG TABS tablet Take by mouth 2 (two) times daily with a meal.    . alendronate (FOSAMAX) 70 MG tablet     . aspirin 81 MG chewable tablet Chew 81 mg by mouth daily.    . calcium-vitamin D (OSCAL WITH D) 250-125 MG-UNIT tablet Take 1 tablet by mouth daily.    . hydrochlorothiazide (HYDRODIURIL) 12.5 MG tablet Take by mouth.    . Multiple Vitamins-Minerals (CENTRUM SILVER PO) Take by mouth.     No current facility-administered medications on file  prior to visit.     ALLERGIES: Allergies  Allergen Reactions  . Oxycodone     FAMILY HISTORY: Family History  Problem Relation Age of Onset  . Kidney disease Neg Hx   . Bladder Cancer Neg Hx     SOCIAL HISTORY: Social History   Socioeconomic History  . Marital status: Married    Spouse name: Not on file  . Number of children: Not on file  . Years of education: Not on file  . Highest education level: Not on file  Occupational History  . Not on file  Social Needs  . Financial resource strain: Not on file  . Food insecurity:     Worry: Not on file    Inability: Not on file  . Transportation needs:    Medical: Not on file    Non-medical: Not on file  Tobacco Use  . Smoking status: Former Research scientist (life sciences)  . Smokeless tobacco: Never Used  . Tobacco comment: quit 10 years  Substance and Sexual Activity  . Alcohol use: Yes    Alcohol/week: 0.0 oz  . Drug use: No  . Sexual activity: Yes  Lifestyle  . Physical activity:    Days per week: Not on file    Minutes per session: Not on file  . Stress: Not on file  Relationships  . Social connections:    Talks on phone: Not on file    Gets together: Not on file    Attends religious service: Not on file    Active member of club or organization: Not on file    Attends meetings of clubs or organizations: Not on file    Relationship status: Not on file  . Intimate partner violence:    Fear of current or ex partner: Not on file    Emotionally abused: Not on file    Physically abused: Not on file    Forced sexual activity: Not on file  Other Topics Concern  . Not on file  Social History Narrative  . Not on file    REVIEW OF SYSTEMS: Constitutional: No fevers, chills, or sweats, no generalized fatigue, change in appetite Eyes: No visual changes, double vision, eye pain Ear, nose and throat: No hearing loss, ear pain, nasal congestion, sore throat Cardiovascular: No chest pain, palpitations Respiratory:  No shortness of breath at rest or with exertion, wheezes GastrointestinaI: No nausea, vomiting, diarrhea, abdominal pain, fecal incontinence Genitourinary:  No dysuria, urinary retention or frequency Musculoskeletal:  No neck pain, back pain Integumentary: No rash, pruritus, skin lesions Neurological: as above Psychiatric: No depression, insomnia, anxiety Endocrine: No palpitations, fatigue, diaphoresis, mood swings, change in appetite, change in weight, increased thirst Hematologic/Lymphatic:  No purpura, petechiae. Allergic/Immunologic: no itchy/runny eyes, nasal  congestion, recent allergic reactions, rashes  PHYSICAL EXAM: Vitals:   09/30/17 1110  BP: (!) 144/94  Pulse: 90  SpO2: 91%   General: No acute distress.  Patient appears well-groomed.  Head:  Normocephalic/atraumatic Eyes:  Fundi examined but not visualized Neck: supple, no paraspinal tenderness, full range of motion Heart:  Regular rate and rhythm Lungs:  Clear to auscultation bilaterally Back: No paraspinal tenderness Neurological Exam: Mental status: alert and oriented to person, place, and time, recent and remote memory intact, fund of knowledge intact, attention and concentration intact, speech fluent and not dysarthric, language intact. Cranial nerves:  CN II-XII intact.  Bulk & Tone: normal, no fasciculations.  Motor:  5/5 throughout  Sensation:  Pinprick sensation intact, vibration sensation reduced in feet.  Deep Tendon Reflexes:  Mildly brisk throughout, Hoffman sign absent, toes downgoing.  Finger to nose testing:  Without dysmetria.  Gait:  Wide-based waddling gait.  Requires several steps to turn.  Unable to tandem walk.  Romberg positive.  IMPRESSION: Unsteady gait.  Unclear etiology.  NPH unlikely given the negative lumbar drain trial.  Also, her gait abnormality is not characteristic for NPH.  She describes symptoms of freezing.  She does not exhibit any parkinsonian symptoms.  Primary freezing of gait is possible but again gait is not characteristic.  She does exhibit slight hyperreflexia, which is of uncertain clinical significance given lack of other symptoms suggesting myelopathy, and MRI of cervical spine unrevealing.  PLAN: 1.  NCV-EMG of lower extremities.  If unremarkable, consider repeat MRI of brain WITH and without contrast due to worsening gait. 2.  Follow up after testing.  25 minutes spent face to face with patient, over 50% spent discussing management.  Metta Clines, DO  CC:  Harrel Lemon, MD

## 2017-10-12 DIAGNOSIS — N3001 Acute cystitis with hematuria: Secondary | ICD-10-CM | POA: Diagnosis not present

## 2017-10-12 DIAGNOSIS — R110 Nausea: Secondary | ICD-10-CM | POA: Diagnosis not present

## 2017-10-12 DIAGNOSIS — R300 Dysuria: Secondary | ICD-10-CM | POA: Diagnosis not present

## 2017-10-20 ENCOUNTER — Ambulatory Visit (INDEPENDENT_AMBULATORY_CARE_PROVIDER_SITE_OTHER): Payer: PPO | Admitting: Neurology

## 2017-10-20 DIAGNOSIS — R2681 Unsteadiness on feet: Secondary | ICD-10-CM | POA: Diagnosis not present

## 2017-10-20 NOTE — Procedures (Signed)
[facility]  [place], [place]  [place], [place] [address] Tel: [phone] Fax:  [phone] Test Date:  10/20/2017  Patient: Abigail Hill DOB: 1951/01/13 Physician: Narda Amber, DO  Sex: Female Height: 5\' 3"  Ref Phys: Metta Clines, D.O.  ID#: [id number] Temp: 32.0C Technician:    Patient Complaints: This is a 67 year old female referred for evaluation of unsteady gait.  NCV & EMG Findings: Extensive electrodiagnostic testing of the right lower extremity and additional studies of the left shows:  1. Bilateral sural and superficial peroneal sensory responses are within normal limits. 2. Left peroneal motor response shows reduced amplitude at the extensor digitorum brevis, and is normal at the tibialis anterior; these findings are of uncertain clinical significance. Bilateral tibial and right peroneal motor responses are within normal limits. 3. Bilateral tibial H reflex studies are within normal limits. 4. There is no evidence of active or chronic motor axon loss changes affecting any of the tested muscles. Motor unit configuration and recruitment pattern is within normal limits.  Impression: This is a normal study of the lower extremities. In particular, there is no evidence of a large fiber sensorimotor polyneuropathy or lumbosacral radiculopathy.   ___________________________ Narda Amber, DO    Nerve Conduction Studies Anti Sensory Summary Table   Site NR Peak (ms) Norm Peak (ms) P-T Amp (V) Norm P-T Amp  Left Sup Peroneal Anti Sensory (Ant Lat Mall)  32C  12 cm    2.7 <4.6 19.8 >3  Right Sup Peroneal Anti Sensory (Ant Lat Mall)  32C  12 cm    2.2 <4.6 17.6 >3  Left Sural Anti Sensory (Lat Mall)  32C  Calf    3.6 <4.6 8.8 >3  Right Sural Anti Sensory (Lat Mall)  32C  Calf    3.4 <4.6 8.7 >3   Motor Summary Table   Site NR Onset (ms) Norm Onset (ms) O-P Amp (mV) Norm O-P Amp Site1 Site2 Delta-0 (ms) Dist (cm) Vel (m/s) Norm Vel (m/s)  Left  Peroneal Motor (Ext Dig Brev)  32C  Ankle    6.0 <6.0 0.8 >2.5 B Fib Ankle 7.8 35.0 45 >40  B Fib    13.8  0.6  Poplt B Fib 1.3 8.0 62 >40  Poplt    15.1  0.6         Right Peroneal Motor (Ext Dig Brev)  32C  Ankle    5.5 <6.0 3.3 >2.5 B Fib Ankle 7.5 37.0 49 >40  B Fib    13.0  3.2  Poplt B Fib 2.0 9.0 45 >40  Poplt    15.0  3.0         Left Peroneal TA Motor (Tib Ant)  32C  Fib Head    2.3 <4.5 4.7 >3 Poplit Fib Head 1.6 8.0 50 >40  Poplit    3.9  4.3         Left Tibial Motor (Abd Hall Brev)  32C  Ankle    4.2 <6.0 7.8 >4 Knee Ankle 8.8 41.0 47 >40  Knee    13.0  5.2         Right Tibial Motor (Abd Hall Brev)  32C  Ankle    4.2 <6.0 8.0 >4 Knee Ankle 8.5 41.0 48 >40  Knee    12.7  6.7          H Reflex Studies   NR H-Lat (ms) Lat Norm (ms) L-R H-Lat (ms)  Left Tibial (Gastroc)  32C  29.52 <35 0.96  Right Tibial (Gastroc)  32C     30.48 <35 0.96   EMG   Side Muscle Ins Act Fibs Psw Fasc Number Recrt Dur Dur. Amp Amp. Poly Poly. Comment  Right AntTibialis Nml Nml Nml Nml Nml Nml Nml Nml Nml Nml Nml Nml N/A  Right Gastroc Nml Nml Nml Nml Nml Nml Nml Nml Nml Nml Nml Nml N/A  Right Flex Dig Long Nml Nml Nml Nml Nml Nml Nml Nml Nml Nml Nml Nml N/A  Right RectFemoris Nml Nml Nml Nml Nml Nml Nml Nml Nml Nml Nml Nml N/A  Right GluteusMed Nml Nml Nml Nml Nml Nml Nml Nml Nml Nml Nml Nml N/A  Right BicepsFemS Nml Nml Nml Nml Nml Nml Nml Nml Nml Nml Nml Nml N/A  Left BicepsFemS Nml Nml Nml Nml Nml Nml Nml Nml Nml Nml Nml Nml N/A  Left AntTibialis Nml Nml Nml Nml Nml Nml Nml Nml Nml Nml Nml Nml N/A  Left Gastroc Nml Nml Nml Nml Nml Nml Nml Nml Nml Nml Nml Nml N/A  Left Flex Dig Long Nml Nml Nml Nml Nml Nml Nml Nml Nml Nml Nml Nml N/A  Left RectFemoris Nml Nml Nml Nml Nml Nml Nml Nml Nml Nml Nml Nml N/A  Left GluteusMed Nml Nml Nml Nml Nml Nml Nml Nml Nml Nml Nml Nml N/A      Waveforms:

## 2017-10-21 ENCOUNTER — Telehealth: Payer: Self-pay

## 2017-10-21 DIAGNOSIS — R2681 Unsteadiness on feet: Secondary | ICD-10-CM

## 2017-10-21 DIAGNOSIS — R292 Abnormal reflex: Secondary | ICD-10-CM

## 2017-10-21 NOTE — Telephone Encounter (Signed)
-----   Message from Pieter Partridge, DO sent at 10/20/2017 10:30 AM EDT ----- Nerve study is normal.  I would like to proceed with MRI of brain with and without contrast for worsening gait.

## 2017-10-21 NOTE — Telephone Encounter (Signed)
Called and LMOVM advising Pt EMG was normal and that I am sending order to [facility] for MRI. I provided the centralized scheduling number for Pt to call tomorrow and schedule

## 2017-10-22 NOTE — Telephone Encounter (Signed)
Pt called to let me know she rcvd my message, I confirmed the scheduling number for her to call.

## 2017-11-09 ENCOUNTER — Ambulatory Visit
Admission: RE | Admit: 2017-11-09 | Discharge: 2017-11-09 | Disposition: A | Discharge status: Home / Self Care | Payer: PPO | Source: Ambulatory Visit | Attending: Neurology | Admitting: Neurology

## 2017-11-09 DIAGNOSIS — R292 Abnormal reflex: Secondary | ICD-10-CM | POA: Insufficient documentation

## 2017-11-09 DIAGNOSIS — R296 Repeated falls: Secondary | ICD-10-CM | POA: Diagnosis not present

## 2017-11-09 DIAGNOSIS — R2681 Unsteadiness on feet: Secondary | ICD-10-CM

## 2017-11-09 DIAGNOSIS — G910 Communicating hydrocephalus: Secondary | ICD-10-CM | POA: Diagnosis not present

## 2017-11-09 MED ORDER — GADOBENATE DIMEGLUMINE 529 MG/ML IV SOLN
15.0000 mL | Freq: Once | INTRAVENOUS | Status: AC | PRN
  Administered 2017-11-09: 15 mL via INTRAVENOUS

## 2017-11-10 ENCOUNTER — Telehealth: Payer: Self-pay | Admitting: *Deleted

## 2017-11-10 NOTE — Telephone Encounter (Signed)
-----   Message from Chester Holstein, LPN sent at 4/93/2419  2:27 PM EDT -----   ----- Message ----- From: Pieter Partridge, DO Sent: 11/10/2017  12:29 PM EDT To: Clois Comber, CMA  MRI of brain is unchanged compared to last year.

## 2017-11-10 NOTE — Telephone Encounter (Signed)
Patient given results.  Last note says to follow up after testing.  Will have someone call her with an appointment.

## 2017-12-17 NOTE — Progress Notes (Signed)
NEUROLOGY FOLLOW UP OFFICE NOTE  SONAKSHI ROLLAND [id number]  HISTORY OF PRESENT ILLNESS: Abigail Hill is a 67 year old right-handed female who follows up for unsteady gait.  She is accompanied by her husband and daughter who supplements history.  UPDATE: She underwent continued workup for abnormal gait: NCV-EMG of the lower extremities from 10/20/17 was normal. MRI of brain with and without contrast from 11/09/17 was personally reviewed and demonstrated chronic communicating hydrocephalus pattern, unchanged since March 2018. She has not shown any improvement.  HISTORY: Since 2015-2016, she reports problems with balance and gait.  She feels like she leans towards the left and tends to drag her left leg.  There is no associated low back pain, radicular pain, or numbness.  Over time, she started having increased falls.  Around the same time, she started having urinary incontinence.  It would occur while trying to reach the bathroom. She denied bowel dysfunction.  She saw a neurologist in early 2018 and was found to have a foot drop, which was initially trated with an AFO and physical therapy.  A NCV-EMG was ordered but never performed.  She subsequently had an MRI of the brain without contrast on 05/15/16, which was personally reviewed, which showed chronic hydrocephalus as demonstrated by moderate lateral and third ventriculomegaly  with transependymal flow, slightly worse compared to a prior head CT from 04/29/09.  MRI of lumbar spine from 06/19/16 showed sacral insufficiency fractures involving the left ala and S3 body and L5 chronic pars defect with disc degeneration but no nerve root impingement.  She was seen by neurosurgery on 07/03/16, who felt that her symptoms were related to NPH rather than lumbosacral etiology and she was referred for a lumbar drain trial and consideration of VP shunt.  She was admitted to [facility] in August 2018 for lumbar drain trial which was unsuccessful.  X-rays of the hip were  unremarkable.  MRI of cervical spine from 06/28/17 showed multilevel degenerative changes with mild spinal stenosis from C3-4 to C5-6 without abnormal cord signal or significant mass effect.  PAST MEDICAL HISTORY: Past Medical History:  Diagnosis Date  . Abnormal gait 12/18/2015  . BP (high blood pressure) 08/24/2015  . Cancer ([place])    skin  . Constriction of ureter (postoperative) 07/28/2015  . Hydronephrosis 07/14/2015  . Hypertension   . Left foot drop 12/18/2015  . Recurrent UTI   . UPJ (ureteropelvic junction) obstruction 07/28/2015  . UTI (lower urinary tract infection) 07/14/2015    MEDICATIONS: Current Outpatient Medications on File Prior to Visit  Medication Sig Dispense Refill  . alendronate (FOSAMAX) 70 MG tablet     . aspirin 81 MG chewable tablet Chew 81 mg by mouth daily.    . calcium carbonate (OSCAL) 1500 (600 Ca) MG TABS tablet Take by mouth 2 (two) times daily with a meal.    . calcium-vitamin D (OSCAL WITH D) 250-125 MG-UNIT tablet Take 1 tablet by mouth daily.    . hydrochlorothiazide (HYDRODIURIL) 12.5 MG tablet Take by mouth.    . Multiple Vitamins-Minerals (CENTRUM SILVER PO) Take by mouth.     No current facility-administered medications on file prior to visit.     ALLERGIES: Allergies  Allergen Reactions  . Oxycodone     FAMILY HISTORY: Family History  Problem Relation Age of Onset  . Kidney disease Neg Hx   . Bladder Cancer Neg Hx    .  SOCIAL HISTORY: Social History   Socioeconomic History  . Marital status: Married  Spouse name: Not on file  . Number of children: Not on file  . Years of education: Not on file  . Highest education level: Not on file  Occupational History  . Not on file  Social Needs  . Financial resource strain: Not on file  . Food insecurity:    Worry: Not on file    Inability: Not on file  . Transportation needs:    Medical: Not on file    Non-medical: Not on file  Tobacco Use  . Smoking status: Former Research scientist (life sciences)    . Smokeless tobacco: Never Used  . Tobacco comment: quit 10 years  Substance and Sexual Activity  . Alcohol use: Yes    Alcohol/week: 0.0 standard drinks  . Drug use: No  . Sexual activity: Yes  Lifestyle  . Physical activity:    Days per week: Not on file    Minutes per session: Not on file  . Stress: Not on file  Relationships  . Social connections:    Talks on phone: Not on file    Gets together: Not on file    Attends religious service: Not on file    Active member of club or organization: Not on file    Attends meetings of clubs or organizations: Not on file    Relationship status: Not on file  . Intimate partner violence:    Fear of current or ex partner: Not on file    Emotionally abused: Not on file    Physically abused: Not on file    Forced sexual activity: Not on file  Other Topics Concern  . Not on file  Social History Narrative  . Not on file    REVIEW OF SYSTEMS: Constitutional: No fevers, chills, or sweats, no generalized fatigue, change in appetite Eyes: No visual changes, double vision, eye pain Ear, nose and throat: No hearing loss, ear pain, nasal congestion, sore throat Cardiovascular: No chest pain, palpitations Respiratory:  No shortness of breath at rest or with exertion, wheezes GastrointestinaI: No nausea, vomiting, diarrhea, abdominal pain, fecal incontinence Genitourinary:  No dysuria, urinary retention or frequency Musculoskeletal:  No neck pain, back pain Integumentary: No rash, pruritus, skin lesions Neurological: as above Psychiatric: No depression, insomnia, anxiety Endocrine: No palpitations, fatigue, diaphoresis, mood swings, change in appetite, change in weight, increased thirst Hematologic/Lymphatic:  No purpura, petechiae. Allergic/Immunologic: no itchy/runny eyes, nasal congestion, recent allergic reactions, rashes  PHYSICAL EXAM: Blood pressure 140/88, pulse 77, height 5' 1.5" (1.562 m), weight 158 lb (71.7 kg), SpO2 94  %. General: No acute distress.  Patient appears well-groomed.   Head:  Normocephalic/atraumatic Eyes:  Fundi examined but not visualized Neck: supple, no paraspinal tenderness, full range of motion Heart:  Regular rate and rhythm Lungs:  Clear to auscultation bilaterally Back: No paraspinal tenderness Neurological Exam: alert and oriented to person, place, and time. Attention span and concentration intact, recent and remote memory intact, fund of knowledge intact.  Speech fluent and not dysarthric, language intact.  CN II-XII intact. Bulk and tone normal, muscle strength 5/5 throughout, slight reduced finger-thumb tapping speed on left.  Sensation to pinprick and vibration intact.  Deep tendon reflexes mildly brisk throughout, toes downgoing, Hoffman sign absent.  Finger to nose testing intact.  Gait wide-based, waddling.  Requires several steps to turn.  Romberg positive.  IMPRESSION: Abnormal gait, sort of magnetic.  Not shuffling.  She does not exhibit any clear signs of Parkinson's disease.  However I have no other explanation for her symptoms  if it is not due to NPH.  PLAN: 1.  We will start a carbidopa levodopa trial 2.  We will check a DaTscan 3.  If testing not revealing, consider consult from movement disorder specialist. 4. Follow up after testing.  25 minutes spent with the patient and her family, over 50% spent discussing differential diagnosis and plan.  Metta Clines, DO  CC: Harrel Lemon, MD

## 2017-12-18 ENCOUNTER — Encounter: Payer: Self-pay | Admitting: Neurology

## 2017-12-18 ENCOUNTER — Other Ambulatory Visit: Payer: Self-pay

## 2017-12-18 ENCOUNTER — Ambulatory Visit (INDEPENDENT_AMBULATORY_CARE_PROVIDER_SITE_OTHER): Payer: PPO | Admitting: Neurology

## 2017-12-18 VITALS — BP 140/88 | HR 77 | Ht 61.5 in | Wt 158.0 lb

## 2017-12-18 DIAGNOSIS — R2681 Unsteadiness on feet: Secondary | ICD-10-CM | POA: Diagnosis not present

## 2017-12-18 DIAGNOSIS — R251 Tremor, unspecified: Secondary | ICD-10-CM

## 2017-12-18 MED ORDER — CARBIDOPA-LEVODOPA 25-100 MG PO TABS: ORAL_TABLET | ORAL | 0 refills | Status: DC

## 2017-12-18 NOTE — Patient Instructions (Addendum)
1. Start carbidopa (Sinemet) 25/100mg  tablets.  Day 1-3: Take 0.5 tablet in morning and 0.5 tablet in evening/bedtime.  Day 4-7: Take 0.5 tablet in morning, 0.5 tablet in afternoon, and 0.5 tablet in evening/bedtime.  Week 2 & thereafter: Take 1 tablet three times daily.   Take the medication at the same time everyday. The medication does not get absorbed into your body as well, if you take it with protein-containing foods (meat, dairy, beans). Try taking the medication about one hour before meals. If you experience nausea by taking it on an empty stomach, you may take it with carbohydrate-containing food,such as bread.  Side effects to look out for, include dizziness, nausea, vivid dreams and hallucinations. If you experience any of these symptoms, please call us.  2.  We will get a dAT scan of the head 3.  Follow up after testing

## 2017-12-21 ENCOUNTER — Telehealth: Payer: Self-pay | Admitting: Neurology

## 2017-12-21 NOTE — Telephone Encounter (Signed)
Patient called and left a vm that she was needing to talk to someone about her carbidopa levodopa medication. Please call her back at [phone]. Thanks!

## 2017-12-23 NOTE — Telephone Encounter (Signed)
Called LMOVM for Pt to return call 

## 2017-12-24 NOTE — Telephone Encounter (Signed)
Called and spoke with Pt, she was concerned about taking her fosamax once a week near the carbidopa-levodopa she takes in the mornings. I spoke with Dr Doristine Devoid CMA and she said she there should not be any problem, I advised Pt.

## 2018-01-09 ENCOUNTER — Other Ambulatory Visit: Payer: Self-pay | Admitting: Neurology

## 2018-01-13 DIAGNOSIS — D2261 Melanocytic nevi of right upper limb, including shoulder: Secondary | ICD-10-CM | POA: Diagnosis not present

## 2018-01-13 DIAGNOSIS — L570 Actinic keratosis: Secondary | ICD-10-CM | POA: Diagnosis not present

## 2018-01-13 DIAGNOSIS — L218 Other seborrheic dermatitis: Secondary | ICD-10-CM | POA: Diagnosis not present

## 2018-01-13 DIAGNOSIS — D2262 Melanocytic nevi of left upper limb, including shoulder: Secondary | ICD-10-CM | POA: Diagnosis not present

## 2018-01-13 DIAGNOSIS — Z08 Encounter for follow-up examination after completed treatment for malignant neoplasm: Secondary | ICD-10-CM | POA: Diagnosis not present

## 2018-01-13 DIAGNOSIS — D2272 Melanocytic nevi of left lower limb, including hip: Secondary | ICD-10-CM | POA: Diagnosis not present

## 2018-01-13 DIAGNOSIS — D225 Melanocytic nevi of trunk: Secondary | ICD-10-CM | POA: Diagnosis not present

## 2018-01-13 DIAGNOSIS — Z85828 Personal history of other malignant neoplasm of skin: Secondary | ICD-10-CM | POA: Diagnosis not present

## 2018-01-13 DIAGNOSIS — X32XXXA Exposure to sunlight, initial encounter: Secondary | ICD-10-CM | POA: Diagnosis not present

## 2018-01-13 DIAGNOSIS — D2271 Melanocytic nevi of right lower limb, including hip: Secondary | ICD-10-CM | POA: Diagnosis not present

## 2018-01-14 ENCOUNTER — Telehealth: Payer: Self-pay | Admitting: Neurology

## 2018-01-14 DIAGNOSIS — R251 Tremor, unspecified: Secondary | ICD-10-CM

## 2018-01-14 DIAGNOSIS — R292 Abnormal reflex: Secondary | ICD-10-CM

## 2018-01-14 DIAGNOSIS — R2681 Unsteadiness on feet: Secondary | ICD-10-CM

## 2018-01-14 NOTE — Telephone Encounter (Signed)
Patient is calling in stating she was supposed to get a DAT SCAN of the head at [facility] and she has yet to hear from anyone regarding this appt. Please call her back at [phone]. Thanks!

## 2018-01-19 NOTE — Telephone Encounter (Signed)
Called and advised Pt that [employer] from [place]  will be callng her to schedule

## 2018-02-19 ENCOUNTER — Telehealth: Payer: Self-pay | Admitting: Neurology

## 2018-02-19 NOTE — Telephone Encounter (Signed)
Patient is calling in wanting to know if she still needs to be taking the carbidopa levodopa medication and she thinks she needs a refill at the [facility] in [place] if so. Please call [phone]. Thanks!

## 2018-02-19 NOTE — Telephone Encounter (Signed)
Called and spoke with Pt, advised her to continue medication and was [facility] to [facility] with 2 additional refills

## 2018-03-11 ENCOUNTER — Encounter (HOSPITAL_COMMUNITY)
Admission: RE | Admit: 2018-03-11 | Discharge: 2018-03-11 | Disposition: A | Discharge status: Home / Self Care | Payer: PPO | Source: Ambulatory Visit | Attending: Neurology | Admitting: Neurology

## 2018-03-11 ENCOUNTER — Ambulatory Visit (HOSPITAL_COMMUNITY)
Admission: RE | Admit: 2018-03-11 | Discharge: 2018-03-11 | Disposition: A | Discharge status: Home / Self Care | Payer: PPO | Source: Ambulatory Visit | Attending: Neurology | Admitting: Neurology

## 2018-03-11 DIAGNOSIS — R2681 Unsteadiness on feet: Secondary | ICD-10-CM | POA: Insufficient documentation

## 2018-03-11 DIAGNOSIS — R292 Abnormal reflex: Secondary | ICD-10-CM | POA: Insufficient documentation

## 2018-03-11 DIAGNOSIS — R2689 Other abnormalities of gait and mobility: Secondary | ICD-10-CM | POA: Diagnosis not present

## 2018-03-11 DIAGNOSIS — R251 Tremor, unspecified: Secondary | ICD-10-CM | POA: Insufficient documentation

## 2018-03-11 MED ORDER — IOFLUPANE I 123 185 MBQ/2.5ML IV SOLN
4.9000 | Freq: Once | INTRAVENOUS | Status: AC
  Administered 2018-03-11: 4.9 via INTRAVENOUS
  Filled 2018-03-11: qty 5

## 2018-03-11 MED ORDER — IODINE STRONG (LUGOLS) 5 % PO SOLN: 0.8000 mL | Freq: Once | ORAL | Status: DC

## 2018-03-12 ENCOUNTER — Telehealth: Payer: Self-pay

## 2018-03-12 NOTE — Telephone Encounter (Signed)
-----   Message from [place], DO sent at 03/11/2018  3:26 PM EST ----- Let pt know that DaT is normal

## 2018-03-12 NOTE — Telephone Encounter (Signed)
Called and advised Pt of normal  DaT results

## 2018-03-18 DIAGNOSIS — Z136 Encounter for screening for cardiovascular disorders: Secondary | ICD-10-CM | POA: Diagnosis not present

## 2018-03-18 DIAGNOSIS — I10 Essential (primary) hypertension: Secondary | ICD-10-CM | POA: Diagnosis not present

## 2018-03-24 ENCOUNTER — Telehealth: Payer: Self-pay

## 2018-03-24 NOTE — Telephone Encounter (Signed)
Calle and LMOVM for Pt to return my call

## 2018-03-24 NOTE — Telephone Encounter (Signed)
-----   Message from Pieter Partridge, DO sent at 03/22/2018  7:26 AM EST ----- As the DaT scan is normal, Parkinson's disease is much less likely.  I would like to refer her to the Movement Disorder Clinic either at [facility] or [facility].

## 2018-03-25 DIAGNOSIS — Z0001 Encounter for general adult medical examination with abnormal findings: Secondary | ICD-10-CM | POA: Diagnosis not present

## 2018-03-25 DIAGNOSIS — Z1239 Encounter for other screening for malignant neoplasm of breast: Secondary | ICD-10-CM | POA: Diagnosis not present

## 2018-03-25 DIAGNOSIS — R269 Unspecified abnormalities of gait and mobility: Secondary | ICD-10-CM | POA: Diagnosis not present

## 2018-03-25 DIAGNOSIS — G912 (Idiopathic) normal pressure hydrocephalus: Secondary | ICD-10-CM | POA: Diagnosis not present

## 2018-03-25 DIAGNOSIS — Z0000 Encounter for general adult medical examination without abnormal findings: Secondary | ICD-10-CM | POA: Diagnosis not present

## 2018-03-25 DIAGNOSIS — I10 Essential (primary) hypertension: Secondary | ICD-10-CM | POA: Diagnosis not present

## 2018-03-25 NOTE — Telephone Encounter (Signed)
Spoke with Pt. She would prefer to make an OV and discuss this with Dr Tomi Likens.

## 2018-04-20 ENCOUNTER — Other Ambulatory Visit: Payer: Self-pay | Admitting: Internal Medicine

## 2018-04-20 DIAGNOSIS — R921 Mammographic calcification found on diagnostic imaging of breast: Secondary | ICD-10-CM

## 2018-04-20 NOTE — Progress Notes (Signed)
NEUROLOGY FOLLOW UP OFFICE NOTE  DORLISA Hill [id number]  HISTORY OF PRESENT ILLNESS: Abigail Hill is a 68 year old right-handed Caucasian woman who follows up for  Unsteady gait.  She is accompanied by her husband and daughter who supplement history.  UPDATE: She was started on a carbidopa-levodopa trial.  DaTscan was performed on 03/11/18, which was normal.  Carbidopa-levodopa was ineffective and she says she feels better off of it.   HISTORY: Since 2015-2016, she reports problems with balance and gait. She feels like she leans towards the left and tends to drag her left leg. There is no associated low back pain, radicular pain, or numbness. Over time, she started having increased falls. Around the same time, she started having urinary incontinence. It would occur while trying to reach the bathroom. She denied bowel dysfunction. She saw a neurologist in early 2018 and was found to have a foot drop, which was initially trated with an AFO and physical therapy. A NCV-EMG was ordered but never performed. She subsequently had an MRI of the brain without contrast on 05/15/16, which was personally reviewed, which showed chronic hydrocephalus as demonstrated by moderate lateral and third ventriculomegaly with transependymal flow, slightly worse compared to a prior head CT from 04/29/09. MRI of lumbar spine from 06/19/16 showed sacral insufficiency fractures involving the left ala and S3 body and L5 chronic pars defect with disc degeneration but no nerve root impingement. She was seen by neurosurgery on 07/03/16, who felt that her symptoms were related to NPH rather than lumbosacral etiology and she was referred for a lumbar drain trial and consideration of VP shunt. She was admitted to [facility] in August 2018 for lumbar drain trial which was unsuccessful. X-rays of the hip were unremarkable.  MRI of cervical spine from 06/28/17 showed multilevel degenerative changes with mild spinal stenosis from C3-4 to  C5-6 without abnormal cord signal or significant mass effect.  She underwent continued workup for abnormal gait:  NCV-EMG of the lower extremities from 10/20/17 was normal.  MRI of brain with and without contrast from 11/09/17 demonstrated chronic communicating hydrocephalus pattern, unchanged since March 2018.  PAST MEDICAL HISTORY: Past Medical History:  Diagnosis Date  . Abnormal gait 12/18/2015  . BP (high blood pressure) 08/24/2015  . Cancer ([place])    skin  . Constriction of ureter (postoperative) 07/28/2015  . Hydronephrosis 07/14/2015  . Hypertension   . Left foot drop 12/18/2015  . Recurrent UTI   . UPJ (ureteropelvic junction) obstruction 07/28/2015  . UTI (lower urinary tract infection) 07/14/2015    MEDICATIONS: Current Outpatient Medications on File Prior to Visit  Medication Sig Dispense Refill  . alendronate (FOSAMAX) 70 MG tablet     . aspirin 81 MG chewable tablet Chew 81 mg by mouth daily.    . Calcium Carbonate-Vit D-Min (CALTRATE PLUS PO) Take by mouth.    . carbidopa-levodopa (SINEMET IR) 25-100 MG tablet Take 1 tablet by mouth 3 (three) times daily. 90 tablet 2  . hydrochlorothiazide (HYDRODIURIL) 12.5 MG tablet Take by mouth.    . Multiple Vitamins-Minerals (CENTRUM SILVER PO) Take by mouth.     No current facility-administered medications on file prior to visit.     ALLERGIES: Allergies  Allergen Reactions  . Oxycodone     FAMILY HISTORY: Family History  Problem Relation Age of Onset  . Kidney disease Neg Hx   . Bladder Cancer Neg Hx    SOCIAL HISTORY: Social History   Socioeconomic History  .  Marital status: Married    Spouse name: Not on file  . Number of children: Not on file  . Years of education: Not on file  . Highest education level: Not on file  Occupational History  . Not on file  Social Needs  . Financial resource strain: Not on file  . Food insecurity:    Worry: Not on file    Inability: Not on file  . Transportation needs:     Medical: Not on file    Non-medical: Not on file  Tobacco Use  . Smoking status: Former Research scientist (life sciences)  . Smokeless tobacco: Never Used  . Tobacco comment: quit 10 years  Substance and Sexual Activity  . Alcohol use: Yes    Alcohol/week: 0.0 standard drinks  . Drug use: No  . Sexual activity: Yes  Lifestyle  . Physical activity:    Days per week: Not on file    Minutes per session: Not on file  . Stress: Not on file  Relationships  . Social connections:    Talks on phone: Not on file    Gets together: Not on file    Attends religious service: Not on file    Active member of club or organization: Not on file    Attends meetings of clubs or organizations: Not on file    Relationship status: Not on file  . Intimate partner violence:    Fear of current or ex partner: Not on file    Emotionally abused: Not on file    Physically abused: Not on file    Forced sexual activity: Not on file  Other Topics Concern  . Not on file  Social History Narrative  . Not on file    REVIEW OF SYSTEMS: Constitutional: No fevers, chills, or sweats, no generalized fatigue, change in appetite Eyes: No visual changes, double vision, eye pain Ear, nose and throat: No hearing loss, ear pain, nasal congestion, sore throat Cardiovascular: No chest pain, palpitations Respiratory:  No shortness of breath at rest or with exertion, wheezes GastrointestinaI: No nausea, vomiting, diarrhea, abdominal pain, fecal incontinence Genitourinary:  No dysuria, urinary retention or frequency Musculoskeletal:  No neck pain, back pain Integumentary: No rash, pruritus, skin lesions Neurological: as above Psychiatric: No depression, insomnia, anxiety Endocrine: No palpitations, fatigue, diaphoresis, mood swings, change in appetite, change in weight, increased thirst Hematologic/Lymphatic:  No purpura, petechiae. Allergic/Immunologic: no itchy/runny eyes, nasal congestion, recent allergic reactions, rashes  PHYSICAL  EXAM: Blood pressure 132/78, pulse 94, height 5' 1.5" (1.562 m), weight 164 lb (74.4 kg), SpO2 94 %. General: No acute distress.  Patient appears well-groomed.   Head:  Normocephalic/atraumatic Eyes:  Fundi examined but not visualized Neck: supple, no paraspinal tenderness, full range of motion Heart:  Regular rate and rhythm Lungs:  Clear to auscultation bilaterally Back: No paraspinal tenderness Neurological Exam: Alert and oriented to person, place, and time.  Attention span and concentration intact.  Recent and remote memory intact.  Fund of knowledge intact.  Speech fluent and not dysarthric.  Language intact.  CN II-XII intact.  Bulk and tone normal.  Muscle strength 5/5 throughout.  No bradykinesia.  Sensation to light touch intact  Mildly reduced vibratory sensation in toes.  Deep tendon reflexes mildly brisk throughout, toes downgoing.  Hoffmann sign absent.  Wide-based gait, waddling.  Requires several steps to turn.  Romberg with mild sway.  IMPRESSION: 1.  Abnormal gait, which appears magnetic.  I would think hydrocephalus is the cause of her symptoms as  all other testing was unrevealing.  However, CSF drain trial was negative.  She does not appear to have Parkinson's disease.  Another possibility would be primary freezing of gait as she describes sometimes inability to pick up her left leg.  However, I think this is less likely as her gait isn't shuffling.  PLAN: 1.  I will refer her to a movement disorder specialist at [facility] for their opinion. 2.  I will also refer her for physical therapy 3.  She will follow up after visit at [facility].  19 minutes spent face to face with patient, over 50% spent discussing differential diagnoses and plan.   Metta Clines, DO  CC: Harrel Lemon, MD

## 2018-04-21 DIAGNOSIS — H2513 Age-related nuclear cataract, bilateral: Secondary | ICD-10-CM | POA: Diagnosis not present

## 2018-04-21 DIAGNOSIS — H18453 Nodular corneal degeneration, bilateral: Secondary | ICD-10-CM | POA: Diagnosis not present

## 2018-04-21 DIAGNOSIS — H52213 Irregular astigmatism, bilateral: Secondary | ICD-10-CM | POA: Diagnosis not present

## 2018-04-21 DIAGNOSIS — H524 Presbyopia: Secondary | ICD-10-CM | POA: Diagnosis not present

## 2018-04-21 DIAGNOSIS — H0288B Meibomian gland dysfunction left eye, upper and lower eyelids: Secondary | ICD-10-CM | POA: Diagnosis not present

## 2018-04-21 DIAGNOSIS — H93233 Hyperacusis, bilateral: Secondary | ICD-10-CM | POA: Diagnosis not present

## 2018-04-21 DIAGNOSIS — H04123 Dry eye syndrome of bilateral lacrimal glands: Secondary | ICD-10-CM | POA: Diagnosis not present

## 2018-04-21 DIAGNOSIS — H0288A Meibomian gland dysfunction right eye, upper and lower eyelids: Secondary | ICD-10-CM | POA: Diagnosis not present

## 2018-04-22 ENCOUNTER — Encounter

## 2018-04-22 ENCOUNTER — Encounter: Payer: Self-pay | Admitting: Neurology

## 2018-04-22 ENCOUNTER — Ambulatory Visit (INDEPENDENT_AMBULATORY_CARE_PROVIDER_SITE_OTHER): Payer: PPO | Admitting: Neurology

## 2018-04-22 VITALS — BP 132/78 | HR 94 | Ht 61.5 in | Wt 164.0 lb

## 2018-04-22 DIAGNOSIS — R2681 Unsteadiness on feet: Secondary | ICD-10-CM | POA: Diagnosis not present

## 2018-04-22 DIAGNOSIS — G9389 Other specified disorders of brain: Secondary | ICD-10-CM | POA: Diagnosis not present

## 2018-04-22 NOTE — Patient Instructions (Signed)
1.  I will order you physical therapy to help with walking 2.  I will refer you to [facility] movement disorder clinic to evaluate for underlying movement disorder or if they believe the hydrocephalus is still the issue. 3.  Follow up after [facility] meeting.

## 2018-05-07 ENCOUNTER — Ambulatory Visit: Payer: PPO

## 2018-05-11 ENCOUNTER — Ambulatory Visit: Payer: PPO | Attending: Neurology | Admitting: Physical Therapy

## 2018-05-11 ENCOUNTER — Other Ambulatory Visit: Payer: Self-pay

## 2018-05-11 ENCOUNTER — Encounter: Payer: Self-pay | Admitting: Physical Therapy

## 2018-05-11 DIAGNOSIS — R2681 Unsteadiness on feet: Secondary | ICD-10-CM | POA: Diagnosis not present

## 2018-05-11 DIAGNOSIS — R262 Difficulty in walking, not elsewhere classified: Secondary | ICD-10-CM | POA: Insufficient documentation

## 2018-05-11 DIAGNOSIS — M6281 Muscle weakness (generalized): Secondary | ICD-10-CM | POA: Diagnosis not present

## 2018-05-11 NOTE — Therapy (Signed)
[place] MAIN [facility] SERVICES [address] [place], [place], [address] Phone: [phone]   Fax:  [phone]  Physical Therapy Evaluation  Patient Details  Name: Abigail Hill MRN: [id number] Date of Birth: 1950/11/05 Referring Provider (PT): Dr. Metta Clines   Encounter Date: 05/11/2018  PT End of Session - 05/11/18 1127    Visit Number  1    Number of Visits  17    Date for PT Re-Evaluation  07/06/18    Authorization Type  medicare, 1/10, evaluated on 05/11/18    PT Start Time  1002    PT Stop Time  1100    PT Time Calculation (min)  58 min    Equipment Utilized During Treatment  Gait belt    Activity Tolerance  Patient tolerated treatment well    Behavior During Therapy  [facility] for tasks assessed/performed       Past Medical History:  Diagnosis Date  . Abnormal gait 12/18/2015  . BP (high blood pressure) 08/24/2015  . Cancer ([place])    skin  . Constriction of ureter (postoperative) 07/28/2015  . Hydronephrosis 07/14/2015  . Hypertension   . Left foot drop 12/18/2015  . Recurrent UTI   . UPJ (ureteropelvic junction) obstruction 07/28/2015  . UTI (lower urinary tract infection) 07/14/2015    Past Surgical History:  Procedure Laterality Date  . broken foot  2012  . COLONOSCOPY WITH PROPOFOL N/A 06/30/2017   Procedure: COLONOSCOPY WITH PROPOFOL;  Surgeon: Lollie Sails, MD;  Location: [facility] ENDOSCOPY;  Service: Endoscopy;  Laterality: N/A;  . kidney repair  1985   repaired torn blood vessel in kidney    There were no vitals filed for this visit.   Subjective Assessment - 05/11/18 1007    Subjective  Patient reports feeling depressed over this whole issue and frustration over lack of walking ability;     Pertinent History  68 yo Female reports instability with gait with difficulty walking. She states, "my left leg gives me trouble. It will  just plant and then I can't pick it up." Pt reports 2 falls in last month. She reports when she  falls it is often falling backwards; She was diagnosed with hydrocephalus and did a drain trial with no relief; She did not qualify for a shunt. Repeat imaging showed continued hydrocephalus that has not worsened; MRI of lumbar spine shows L5 pars defect with sacral insufficiency; She denies any back pain and reports no numbness in legs. She reports feeling frustration because they can't seem to find what's wrong with her. She does have urinary incontience; reports knowing when she has to go to the bathroom but can't get there fast enough; Patient has a walker and rollator; She uses both; She also has a manual wheelchair that she uses intermittently;     Limitations  Standing;Walking    How long can you sit comfortably?  NA    How long can you stand comfortably?  has pain in LLE with prolonged stooping; <10 min;     How long can you walk comfortably?  200 feet with assistance requiring several breaks;     Diagnostic tests  has had MRI and brain imaging;     Patient Stated Goals  "Be able to travel more, little sight seeing; walk short distances" Patient reports that she would like to be able to get rid of the walker.     Currently in Pain?  No/denies    Multiple Pain Sites  No         [facility] PT Assessment - 05/11/18 0001      Assessment   Medical Diagnosis  Gait instability    Referring Provider (PT)  Dr. Metta Clines    Onset Date/Surgical Date  --   5-6 years ago   Hand Dominance  Right    Next MD Visit  07/22/18    Prior Therapy  had PT a few years ago for drop foot with some improvement;       Precautions   Precautions  Fall    Required Braces or Orthoses  --   none     Restrictions   Weight Bearing Restrictions  No      Balance Screen   Has the patient fallen in the past 6 months  Yes    How many times?  2+    Has the patient had a decrease in activity level because of a fear of falling?   Yes    Is the patient reluctant to leave their home because of a fear of falling?   Yes       Home Environment   Additional Comments  Lives in single story home with 3 steps with 1 rail; negotiates one step at a time; does require assstance from family; husband lives with her; does have ramp in back of house;       Prior Function   Level of Independence  Independent;Independent with gait;Independent with transfers    Vocation  Retired    Leisure  shopping, going to church, being with grandchildren (41, 85, 10. 2 year olds)      Cognition   Overall Cognitive Status  Within Functional Limits for tasks assessed    Behaviors  --   very pleasant     Observation/Other Assessments   Observations  ver pleasant woman who is frustrated over lack of mobility      Sensation   Light Touch  Appears Intact    Proprioception  Appears Intact      Coordination   Gross Motor Movements are Fluid and Coordinated  Yes    Fine Motor Movements are Fluid and Coordinated  Yes    Finger Nose Finger Test  accurate bilaterally    Heel Shin Test  decreased ROM but accurate bilaterally in supine; has difficulty in sitting      Posture/Postural Control   Posture Comments  able to sit unsupported, mild slumped posture, able to self correct with verbal cues;       Tone   Assessment Location  Right Lower Extremity;Left Lower Extremity      Deep Tendon Reflexes   DTR Assessment Site  Patella;Achilles    Patella DTR  2+    Achilles DTR  2+      ROM / Strength   AROM / PROM / Strength  AROM;Strength      AROM   Overall AROM Comments  BUE and BLE are [facility]      Strength   Right Hip Flexion  4/5    Right Hip ABduction  4/5    Right Hip ADduction  4/5    Left Hip Flexion  4/5    Left Hip ABduction  4/5    Left Hip ADduction  4/5    Right Knee Flexion  4+/5    Right Knee Extension  4+/5    Left Knee Flexion  4+/5    Left Knee Extension  4+/5    Right Ankle Dorsiflexion  4+/5    Left Ankle Dorsiflexion  4+/5      Palpation   Palpation comment  denies any tenderness to palpation;        Transfers   Comments  able to transfer sit<>stand from regular chair without pushing on arm rests, however would prefer arm rests;       Ambulation/Gait   Gait Comments  when walking with RW, exhibits slow speed, decreased step length on LLE with decreased foot clearance and poor heel strike; exhibits mild forward flexed posture and decreased hip flexion bilaterally during swing      Standardized Balance Assessment   10 Meter Walk  0.37 m/s with RW; high fall risk; limited home ambulator;       Oceanographer Test   Sit to Stand  Able to stand without using hands and stabilize independently    Standing Unsupported  Able to stand safely 2 minutes    Sitting with Back Unsupported but Feet Supported on Floor or Stool  Able to sit safely and securely 2 minutes    Stand to Sit  Sits safely with minimal use of hands    Transfers  Able to transfer safely, minor use of hands    Standing Unsupported with Eyes Closed  Able to stand 10 seconds with supervision    Standing Unsupported with Feet Together  Able to place feet together independently and stand for 1 minute with supervision    From Standing, Reach Forward with Outstretched Arm  Can reach forward >12 cm safely (5")    From Standing Position, Pick up Object from Floor  Unable to pick up shoe, but reaches 2-5 cm (1-2") from shoe and balances independently    From Standing Position, Turn to Look Behind Over each Shoulder  Looks behind from both sides and weight shifts well    Turn 360 Degrees  Able to turn 360 degrees safely but slowly    Standing Unsupported, Alternately Place Feet on Step/Stool  Able to complete >2 steps/needs minimal assist    Standing Unsupported, One Foot in Front  Able to take small step independently and hold 30 seconds    Standing on One Leg  Tries to lift leg/unable to hold 3 seconds but remains standing independently    Total Score  41    Berg comment:  80% fall risk;      High Level Balance   High Level Balance  Comments  no loss of balance when standing with feet apart, eyes closed: no loss of balance;       RLE Tone   RLE Tone  Within Functional Limits      LLE Tone   LLE Tone  Within Functional Limits                Objective measurements completed on examination: See above findings.              PT Education - 05/11/18 1127    Education Details  recommendations/plan of care    Person(s) Educated  Patient;Spouse    Methods  Explanation    Comprehension  Verbalized understanding       PT Short Term Goals - 05/11/18 1157      PT SHORT TERM GOAL #1   Title  Patient will be adherent to HEP at least 3x a week to improve functional strength and balance for better safety at home.    Time  4    Period  Weeks    Status  New    Target Date  06/08/18      PT SHORT TERM GOAL #2   Title   Patient will deny any falls over past 4 weeks to demonstrate improved safety awareness at home and work.     Time  4    Period  Weeks    Status  New    Target Date  06/08/18        PT Long Term Goals - 05/11/18 1158      PT LONG TERM GOAL #1   Title  Patient will increase Berg Balance score by > 6 points to demonstrate decreased fall risk during functional activities.    Time  8    Period  Weeks    Status  New    Target Date  07/06/18      PT LONG TERM GOAL #2   Title  Patient will increase 10 meter walk test to >0.8 m/s as to improve gait speed for better community ambulation and to reduce fall risk.    Time  8    Period  Weeks    Status  New    Target Date  07/06/18      PT LONG TERM GOAL #3   Title  Patient will be require no assist with ascend/descend 3 steps using Least restrictive assistive device to improve safety with entry/exit home.     Time  8    Period  Weeks    Status  New    Target Date  07/06/18             Plan - 05/11/18 1129    Clinical Impression Statement  68 yo Female reports episodes of LLE dragging while walking which contributes to  imbalance and gait instability; Neurologist ordered multiple tests which showed the following: hydrocephalus and L5 Pars defect with sacral insufficiency; She did try a drain for hydrocephalus but due to severe nausea was unable to tolerate and was not a candidate for a shunt. Repeat imaging shows that hydrocephalus is the same from previous year with no progression. She denies any back pain and denies any numbness/tingling in LLE; She has intact proprioception and good reflexes. Patient did test positive for high fall risk with Berg Balance Assessment. When walking with RW, she exhibits forward flexed posture with decreased hip flexion during swing, decreased step length on LLE with toe strike at initial contact and decreased heel strike. She amubulates with a slower gait speed. She exhibits high fear of falling with decreased weight shift. She would benefit from skilled PT intervention to improve balance and stance control to improve mobility;     Personal Factors and Comorbidities  Age;Time since onset of injury/illness/exacerbation;Comorbidity 3+    Comorbidities  HTN (controlled), hydrocephalus somewhat controlled, L5 pars defect;     Examination-Activity Limitations  Bend;Caring for Others;Carry;Continence;Dressing;Lift;Locomotion Level;Stairs;Stand;Transfers    Examination-Participation Restrictions  Cleaning;Community Activity;Driving;Laundry;Meal Prep;Shop;Yard Work    Stability/Clinical Decision Making  Evolving/Moderate complexity    Clinical Decision Making  Moderate    Rehab Potential  Good    PT Frequency  2x / week    PT Duration  8 weeks    PT Treatment/Interventions  Cryotherapy;Electrical Stimulation;Moist Heat;Gait training;Stair training;Functional mobility training;Therapeutic activities;Therapeutic exercise;Balance training;Neuromuscular re-education;Patient/family education;Energy conservation    PT Next Visit Plan  initiate HEP, work on balance/weight shift    PT Home Exercise  Plan  will address next visit    Consulted and Agree with Plan of Care  Patient  Patient will benefit from skilled therapeutic intervention in order to improve the following deficits and impairments:  Abnormal gait, Decreased balance, Decreased endurance, Decreased mobility, Difficulty walking, Impaired perceived functional ability, Decreased activity tolerance, Decreased safety awareness  Visit Diagnosis: Unsteadiness on feet  Difficulty in walking, not elsewhere classified     Problem List Patient Active Problem List   Diagnosis Date Noted  . NPH (normal pressure hydrocephalus) ([place]) 07/30/2016  . Left leg weakness 04/20/2016  . Menopausal osteoporosis 03/04/2016  . Abnormal gait 12/18/2015  . Left foot drop 12/18/2015  . BP (high blood pressure) 08/24/2015  . Hydronephrosis, left 07/28/2015  . UPJ (ureteropelvic junction) obstruction 07/28/2015  . UTI (lower urinary tract infection) 07/14/2015  . Hydronephrosis 07/14/2015    Steven Veazie PT, DPT 05/11/2018, 12:03 PM  [place] MAIN [facility] SERVICES [address] [place], [place], [address] Phone: [phone]   Fax:  [phone]  Name: Abigail Hill MRN: [id number] Date of Birth: 18-Sep-1950

## 2018-05-13 ENCOUNTER — Ambulatory Visit: Payer: PPO | Admitting: Physical Therapy

## 2018-05-13 ENCOUNTER — Other Ambulatory Visit: Payer: Self-pay

## 2018-05-13 ENCOUNTER — Encounter: Payer: Self-pay | Admitting: Physical Therapy

## 2018-05-13 DIAGNOSIS — R262 Difficulty in walking, not elsewhere classified: Secondary | ICD-10-CM

## 2018-05-13 DIAGNOSIS — R2681 Unsteadiness on feet: Secondary | ICD-10-CM

## 2018-05-13 DIAGNOSIS — M6281 Muscle weakness (generalized): Secondary | ICD-10-CM

## 2018-05-13 NOTE — Therapy (Signed)
[place] MAIN [facility] SERVICES [address] [place], [place], [address] Phone: [phone]   Fax:  [phone]  Physical Therapy Treatment  Patient Details  Name: Abigail Hill MRN: [id number] Date of Birth: 10-23-1950 Referring Provider (PT): Dr. Metta Clines   Encounter Date: 05/13/2018  PT End of Session - 05/13/18 1023    Visit Number  2    Number of Visits  17    Date for PT Re-Evaluation  07/06/18    Authorization Type  medicare, 1/10, evaluated on 05/11/18    PT Start Time  0932    PT Stop Time  1015    PT Time Calculation (min)  43 min    Equipment Utilized During Treatment  Gait belt    Activity Tolerance  Patient tolerated treatment well    Behavior During Therapy  [facility] for tasks assessed/performed       Past Medical History:  Diagnosis Date  . Abnormal gait 12/18/2015  . BP (high blood pressure) 08/24/2015  . Cancer ([place])    skin  . Constriction of ureter (postoperative) 07/28/2015  . Hydronephrosis 07/14/2015  . Hypertension   . Left foot drop 12/18/2015  . Recurrent UTI   . UPJ (ureteropelvic junction) obstruction 07/28/2015  . UTI (lower urinary tract infection) 07/14/2015    Past Surgical History:  Procedure Laterality Date  . broken foot  2012  . COLONOSCOPY WITH PROPOFOL N/A 06/30/2017   Procedure: COLONOSCOPY WITH PROPOFOL;  Surgeon: Lollie Sails, MD;  Location: [facility] ENDOSCOPY;  Service: Endoscopy;  Laterality: N/A;  . kidney repair  1985   repaired torn blood vessel in kidney    There were no vitals filed for this visit.  Subjective Assessment - 05/13/18 1022    Subjective  Patient reports doing well. She states, "I feel about the same. Ready to get my balance better."    Pertinent History  68 yo Female reports instability with gait with difficulty walking. She states, "my left leg gives me trouble. It will  just plant and then I can't pick it up." Pt reports 2 falls in last month. She reports when she falls it  is often falling backwards; She was diagnosed with hydrocephalus and did a drain trial with no relief; She did not qualify for a shunt. Repeat imaging showed continued hydrocephalus that has not worsened; MRI of lumbar spine shows L5 pars defect with sacral insufficiency; She denies any back pain and reports no numbness in legs. She reports feeling frustration because they can't seem to find what's wrong with her. She does have urinary incontience; reports knowing when she has to go to the bathroom but can't get there fast enough; Patient has a walker and rollator; She uses both; She also has a manual wheelchair that she uses intermittently;     Limitations  Standing;Walking    How long can you sit comfortably?  NA    How long can you stand comfortably?  has pain in LLE with prolonged stooping; <10 min;     How long can you walk comfortably?  200 feet with assistance requiring several breaks;     Diagnostic tests  has had MRI and brain imaging;     Patient Stated Goals  "Be able to travel more, little sight seeing; walk short distances" Patient reports that she would like to be able to get rid of the walker.     Currently in Pain?  No/denies    Multiple Pain Sites  No           Instructed patient in advanced balance exercise as part of HEP: Standing in corner with chair in front for balance safety: -feet together unsupported eyes open/closed 10 sec hold x3 reps -feet together, eyes open, head turns side/side, up/down x10 reps each direction; Required min VCs to improve erect posture and utilize gaze stabilization to improve stance control especially with narrow base of support on firm surface. Able to complete with good stance control, supervision;  Standing next to parallel bars: -forward/backward walking with 1 rail assist x10 feet x 4 laps -Alternate march with 1 rail assist progressing to finger tip hold to improve weight shift and dynamic balance x10 reps bilaterally;  Gait with AFO on  LLE x80 feet with RW, supervision; required min VCS to increase knee extension at terminal swing to facilitate better ankle DF at initial contact; initially patient exhibits heavy toe strike despite having AFO due to flexed hip/knee during terminal swing; However with cues was able to partially correct with better foot clearance; impaired motor planning leads to poor foot clearance with decreased weight shift and forward flexed posture; Recommended patient try wearing AFO on a trial basis and if she feels like it helps then great but if she doesn't tell a difference she can discontinue use.     Patient instructed in advanced balance exercise  Standing in parallel bars:  Standing on airex foam: -alternate toe taps to 4 inch step with 2-1 rail assist x15 reps bilaterally; -Standing one foot on airex, one foot on 4 inch step, BUE ball pass side/side x5 reps each foot on step Patient required min VCs for balance stability, including to increase trunk control for less loss of balance with smaller base of support    Patient tolerated session well; She does exhibit decreased weight shift which is likely contributing to imbalance and decreased foot clearance; She reports mild fatigue at end of session;                  PT Education - 05/13/18 1022    Education Details  balance exercise, HEP initiated    Person(s) Educated  Patient    Methods  Explanation;Verbal cues    Comprehension  Verbalized understanding;Returned demonstration;Verbal cues required;Need further instruction       PT Short Term Goals - 05/11/18 1157      PT SHORT TERM GOAL #1   Title  Patient will be adherent to HEP at least 3x a week to improve functional strength and balance for better safety at home.    Time  4    Period  Weeks    Status  New    Target Date  06/08/18      PT SHORT TERM GOAL #2   Title   Patient will deny any falls over past 4 weeks to demonstrate improved safety awareness at home and work.      Time  4    Period  Weeks    Status  New    Target Date  06/08/18        PT Long Term Goals - 05/11/18 1158      PT LONG TERM GOAL #1   Title  Patient will increase Berg Balance score by > 6 points to demonstrate decreased fall risk during functional activities.    Time  8    Period  Weeks    Status  New    Target Date  07/06/18  PT LONG TERM GOAL #2   Title  Patient will increase 10 meter walk test to >0.8 m/s as to improve gait speed for better community ambulation and to reduce fall risk.    Time  8    Period  Weeks    Status  New    Target Date  07/06/18      PT LONG TERM GOAL #3   Title  Patient will be require no assist with ascend/descend 3 steps using Least restrictive assistive device to improve safety with entry/exit home.     Time  8    Period  Weeks    Status  New    Target Date  07/06/18            Plan - 05/13/18 1023    Clinical Impression Statement  Patient tolerated session well; She presents to therapy with AFO. PT instructed patient in gait training with AFO. Due to poor motor planning she conitnues to have heavy toe strike despite AFO due to having flexed posture during terminal swing. With cues she was able to partially self correct but this is variable. Instructed patient in advanced balance activities. She is hesitant to shift her weight due to fear of falling which contributes to impaired balance. She would benefit from additional skilled PT Intervention to improve strength, balance and gait safety;     Personal Factors and Comorbidities  Age;Time since onset of injury/illness/exacerbation;Comorbidity 3+    Comorbidities  HTN (controlled), hydrocephalus somewhat controlled, L5 pars defect;     Examination-Activity Limitations  Bend;Caring for Others;Carry;Continence;Dressing;Lift;Locomotion Level;Stairs;Stand;Transfers    Examination-Participation Restrictions  Cleaning;Community Activity;Driving;Laundry;Meal Prep;Shop;Yard Work     Stability/Clinical Decision Making  Evolving/Moderate complexity    Rehab Potential  Good    PT Frequency  2x / week    PT Duration  8 weeks    PT Treatment/Interventions  Cryotherapy;Electrical Stimulation;Moist Heat;Gait training;Stair training;Functional mobility training;Therapeutic activities;Therapeutic exercise;Balance training;Neuromuscular re-education;Patient/family education;Energy conservation    PT Next Visit Plan  initiate HEP, work on balance/weight shift    PT Home Exercise Plan  will address next visit    Consulted and Agree with Plan of Care  Patient       Patient will benefit from skilled therapeutic intervention in order to improve the following deficits and impairments:  Abnormal gait, Decreased balance, Decreased endurance, Decreased mobility, Difficulty walking, Impaired perceived functional ability, Decreased activity tolerance, Decreased safety awareness  Visit Diagnosis: Unsteadiness on feet  Difficulty in walking, not elsewhere classified  Muscle weakness (generalized)     Problem List Patient Active Problem List   Diagnosis Date Noted  . NPH (normal pressure hydrocephalus) ([place]) 07/30/2016  . Left leg weakness 04/20/2016  . Menopausal osteoporosis 03/04/2016  . Abnormal gait 12/18/2015  . Left foot drop 12/18/2015  . BP (high blood pressure) 08/24/2015  . Hydronephrosis, left 07/28/2015  . UPJ (ureteropelvic junction) obstruction 07/28/2015  . UTI (lower urinary tract infection) 07/14/2015  . Hydronephrosis 07/14/2015    Abigail Hill PT, DPT 05/13/2018, 10:24 AM  [place] MAIN [facility] SERVICES [address] [place], [place], [address] Phone: [phone]   Fax:  [phone]  Name: Abigail Hill MRN: [id number] Date of Birth: 04/23/1950

## 2018-05-13 NOTE — Patient Instructions (Signed)
Access Code: [email or web address]  URL: [email or web address]  Date: 05/13/2018  Prepared by: Blanche East   Exercises  . Romberg Stance Eyes Closed on Foam Pad - 3 reps - 2 sets - 10 hold - 2x daily - 7x weekly  . Romberg Stance on Foam Pad with Head Rotation - 10 reps - 1 sets - 1x daily - 7x weekly  . Standing March - 10 reps - 2 sets - 2x daily - 7x weekly  . Backward Walking with Counter Support - 10 reps - 1 sets - 1x daily - 7x weekly

## 2018-05-14 ENCOUNTER — Ambulatory Visit
Admission: RE | Admit: 2018-05-14 | Discharge: 2018-05-14 | Disposition: A | Discharge status: Home / Self Care | Payer: PPO | Source: Ambulatory Visit | Attending: Internal Medicine | Admitting: Internal Medicine

## 2018-05-14 DIAGNOSIS — R921 Mammographic calcification found on diagnostic imaging of breast: Secondary | ICD-10-CM

## 2018-05-18 ENCOUNTER — Other Ambulatory Visit: Payer: Self-pay

## 2018-05-18 ENCOUNTER — Ambulatory Visit: Payer: PPO | Admitting: Physical Therapy

## 2018-05-18 ENCOUNTER — Encounter: Payer: Self-pay | Admitting: Physical Therapy

## 2018-05-18 DIAGNOSIS — R2681 Unsteadiness on feet: Secondary | ICD-10-CM | POA: Diagnosis not present

## 2018-05-18 DIAGNOSIS — R262 Difficulty in walking, not elsewhere classified: Secondary | ICD-10-CM

## 2018-05-18 DIAGNOSIS — M6281 Muscle weakness (generalized): Secondary | ICD-10-CM

## 2018-05-18 NOTE — Therapy (Signed)
[place] MAIN [facility] SERVICES [address] [place], [place], [address] Phone: [phone]   Fax:  [phone]  Physical Therapy Treatment  Patient Details  Name: Abigail Hill MRN: [id number] Date of Birth: [birth year] Referring Provider (PT): Dr. Metta Clines   Encounter Date: 05/18/2018  PT End of Session - 05/18/18 1057    Visit Number  3    Number of Visits  17    Date for PT Re-Evaluation  07/06/18    Authorization Type  medicare, 2/10, evaluated on 05/11/18    PT Start Time  0933    PT Stop Time  1020    PT Time Calculation (min)  47 min    Equipment Utilized During Treatment  Gait belt    Activity Tolerance  Patient tolerated treatment well    Behavior During Therapy  [facility] for tasks assessed/performed       Past Medical History:  Diagnosis Date  . Abnormal gait 12/18/2015  . BP (high blood pressure) 08/24/2015  . Cancer ([place])    skin  . Constriction of ureter (postoperative) 07/28/2015  . Hydronephrosis 07/14/2015  . Hypertension   . Left foot drop 12/18/2015  . Recurrent UTI   . UPJ (ureteropelvic junction) obstruction 07/28/2015  . UTI (lower urinary tract infection) 07/14/2015    Past Surgical History:  Procedure Laterality Date  . broken foot  2012  . COLONOSCOPY WITH PROPOFOL N/A 06/30/2017   Procedure: COLONOSCOPY WITH PROPOFOL;  Surgeon: Lollie Sails, MD;  Location: [facility] ENDOSCOPY;  Service: Endoscopy;  Laterality: N/A;  . kidney repair  1985   repaired torn blood vessel in kidney    There were no vitals filed for this visit.  Subjective Assessment - 05/18/18 1054    Subjective  Pt reports she is not having any problems with her HEP.  She reports no falls since her last visit.    Pertinent History  68 yo Female reports instability with gait with difficulty walking. She states, "my left leg gives me trouble. It will  just plant and then I can't pick it up." Pt reports 2 falls in last month. She reports when she falls  it is often falling backwards; She was diagnosed with hydrocephalus and did a drain trial with no relief; She did not qualify for a shunt. Repeat imaging showed continued hydrocephalus that has not worsened; MRI of lumbar spine shows L5 pars defect with sacral insufficiency; She denies any back pain and reports no numbness in legs. She reports feeling frustration because they can't seem to find what's wrong with her. She does have urinary incontience; reports knowing when she has to go to the bathroom but can't get there fast enough; Patient has a walker and rollator; She uses both; She also has a manual wheelchair that she uses intermittently;     Limitations  Standing;Walking    How long can you stand comfortably?  has pain in LLE with prolonged stooping; <10 min;     How long can you walk comfortably?  200 feet with assistance requiring several breaks;     Diagnostic tests  has had MRI and brain imaging;     Patient Stated Goals  "Be able to travel more, little sight seeing; walk short distances" Patient reports that she would like to be able to get rid of the walker.     Currently in Pain?  No/denies    Multiple Pain Sites  No  Treatment:  Gait Training:  Ambulated on level surface with rollator with SBA x1; verbal cueing for rollator placement and decreasing shuffling pattern, and cueing for improved postural alignment.  100 ft x 3.  Therapeutic Exercises:  Heel/toe raises 20x; up and down 3 stairs with rails - 4 trials; standing marching 2x10; B hip abd in standing 2x10; sit to stands from chair without UE support 2x10.  NMR:  Narrow BOS on AirEx pad with EO and EC; tandem stance on floor with finger tip support; SLS on floor with min-no UE support; Alt toe tapping on 4" step.                      PT Education - 05/18/18 1056    Education Details  Instructed in narrow BOS and tandem stance kitchen sink balanc exercises    Person(s) Educated  Patient    Methods   Explanation;Verbal cues    Comprehension  Verbalized understanding;Returned demonstration       PT Short Term Goals - 05/11/18 1157      PT SHORT TERM GOAL #1   Title  Patient will be adherent to HEP at least 3x a week to improve functional strength and balance for better safety at home.    Time  4    Period  Weeks    Status  New    Target Date  06/08/18      PT SHORT TERM GOAL #2   Title   Patient will deny any falls over past 4 weeks to demonstrate improved safety awareness at home and work.     Time  4    Period  Weeks    Status  New    Target Date  06/08/18        PT Long Term Goals - 05/11/18 1158      PT LONG TERM GOAL #1   Title  Patient will increase Berg Balance score by > 6 points to demonstrate decreased fall risk during functional activities.    Time  8    Period  Weeks    Status  New    Target Date  07/06/18      PT LONG TERM GOAL #2   Title  Patient will increase 10 meter walk test to >0.8 m/s as to improve gait speed for better community ambulation and to reduce fall risk.    Time  8    Period  Weeks    Status  New    Target Date  07/06/18      PT LONG TERM GOAL #3   Title  Patient will be require no assist with ascend/descend 3 steps using Least restrictive assistive device to improve safety with entry/exit home.     Time  8    Period  Weeks    Status  New    Target Date  07/06/18            Plan - 05/18/18 1058    Clinical Impression Statement  Pt needed verbal cueing to keep rollator closer to her body with ambulation and to improve heel strike with gait.  She reported that the AFO at last visit "causes me more problems and makes me feel more off balance."  Performed gait trg with no AFO today.  Pt tended to shift weight towards left with AirEx balance ex's and needed cueing to shift to right with balance activities, she also needed cueing to correct posterior lean with narrow BOS AirEx  activities.    Personal Factors and Comorbidities   Age;Time since onset of injury/illness/exacerbation;Comorbidity 3+    Examination-Activity Limitations  Bend;Caring for Others;Carry;Continence;Dressing;Lift;Locomotion Level;Stairs;Stand;Transfers    Examination-Participation Restrictions  Cleaning;Community Activity;Driving;Laundry;Meal Prep;Shop;Yard Work    Stability/Clinical Decision Making  Evolving/Moderate complexity    Clinical Decision Making  Moderate    Rehab Potential  Good    PT Frequency  2x / week    PT Duration  8 weeks    PT Treatment/Interventions  Cryotherapy;Electrical Stimulation;Moist Heat;Gait training;Stair training;Functional mobility training;Therapeutic activities;Therapeutic exercise;Balance training;Neuromuscular re-education;Patient/family education;Energy conservation    PT Next Visit Plan  Continue to work on balance activities and strengthening at next visit       Patient will benefit from skilled therapeutic intervention in order to improve the following deficits and impairments:  Abnormal gait, Decreased balance, Decreased endurance, Decreased mobility, Difficulty walking, Impaired perceived functional ability, Decreased activity tolerance, Decreased safety awareness  Visit Diagnosis: Unsteadiness on feet  Difficulty in walking, not elsewhere classified  Muscle weakness (generalized)     Problem List Patient Active Problem List   Diagnosis Date Noted  . NPH (normal pressure hydrocephalus) ([place]) 07/30/2016  . Left leg weakness 04/20/2016  . Menopausal osteoporosis 03/04/2016  . Abnormal gait 12/18/2015  . Left foot drop 12/18/2015  . BP (high blood pressure) 08/24/2015  . Hydronephrosis, left 07/28/2015  . UPJ (ureteropelvic junction) obstruction 07/28/2015  . UTI (lower urinary tract infection) 07/14/2015  . Hydronephrosis 07/14/2015    Harlie Ragle, MPT 05/18/2018, 11:07 AM  [place] MAIN [facility] SERVICES [address] [place], [place],  [address] Phone: [phone]   Fax:  [phone]  Name: Abigail Hill MRN: [id number] Date of Birth: 1950-08-04

## 2018-05-20 ENCOUNTER — Ambulatory Visit: Payer: PPO

## 2018-05-20 ENCOUNTER — Other Ambulatory Visit: Payer: Self-pay

## 2018-05-20 VITALS — BP 147/70 | HR 82

## 2018-05-20 DIAGNOSIS — R2681 Unsteadiness on feet: Secondary | ICD-10-CM | POA: Diagnosis not present

## 2018-05-20 DIAGNOSIS — R262 Difficulty in walking, not elsewhere classified: Secondary | ICD-10-CM

## 2018-05-20 DIAGNOSIS — M6281 Muscle weakness (generalized): Secondary | ICD-10-CM

## 2018-05-20 NOTE — Therapy (Signed)
[place] MAIN [facility] SERVICES [address] [place], [place], [address] Phone: [phone]   Fax:  [phone]  Physical Therapy Treatment  Patient Details  Name: Abigail Hill MRN: [id number] Date of Birth: March 28, 1950 Referring Provider (PT): Dr. Metta Clines   Encounter Date: 05/20/2018  PT End of Session - 05/20/18 0927    Visit Number  4    Number of Visits  17    Date for PT Re-Evaluation  07/06/18    Authorization Type  medicare, 3/10, evaluated on 05/11/18    PT Start Time  0930    PT Stop Time  1015    PT Time Calculation (min)  45 min    Equipment Utilized During Treatment  Gait belt    Activity Tolerance  Patient tolerated treatment well    Behavior During Therapy  [facility] for tasks assessed/performed       Past Medical History:  Diagnosis Date  . Abnormal gait 12/18/2015  . BP (high blood pressure) 08/24/2015  . Cancer ([place])    skin  . Constriction of ureter (postoperative) 07/28/2015  . Hydronephrosis 07/14/2015  . Hypertension   . Left foot drop 12/18/2015  . Recurrent UTI   . UPJ (ureteropelvic junction) obstruction 07/28/2015  . UTI (lower urinary tract infection) 07/14/2015    Past Surgical History:  Procedure Laterality Date  . broken foot  2012  . COLONOSCOPY WITH PROPOFOL N/A 06/30/2017   Procedure: COLONOSCOPY WITH PROPOFOL;  Surgeon: Lollie Sails, MD;  Location: [facility] ENDOSCOPY;  Service: Endoscopy;  Laterality: N/A;  . kidney repair  1985   repaired torn blood vessel in kidney    Vitals:   05/20/18 0934  BP: (!) 147/70  Pulse: 82  SpO2: 96%    Subjective Assessment - 05/20/18 0926    Subjective  Pt reports she is not having any problems with her HEP.  She reports no falls since her last visit.     Pertinent History  68 yo Female reports instability with gait with difficulty walking. She states, "my left leg gives me trouble. It will  just plant and then I can't pick it up." Pt reports 2 falls in last month.  She reports when she falls it is often falling backwards; She was diagnosed with hydrocephalus and did a drain trial with no relief; She did not qualify for a shunt. Repeat imaging showed continued hydrocephalus that has not worsened; MRI of lumbar spine shows L5 pars defect with sacral insufficiency; She denies any back pain and reports no numbness in legs. She reports feeling frustration because they can't seem to find what's wrong with her. She does have urinary incontience; reports knowing when she has to go to the bathroom but can't get there fast enough; Patient has a walker and rollator; She uses both; She also has a manual wheelchair that she uses intermittently;     Limitations  Standing;Walking    How long can you stand comfortably?  has pain in LLE with prolonged stooping; <10 min;     How long can you walk comfortably?  200 feet with assistance requiring several breaks;     Diagnostic tests  has had MRI and brain imaging;     Patient Stated Goals  "Be able to travel more, little sight seeing; walk short distances" Patient reports that she would like to be able to get rid of the walker.     Currently in Pain?  No/denies  TREATMENT   Ther-ex  NuStep L2 x 4 minutes for warm-up during history (2 minutes unbilled); Seated marches with manual resistance alternating x 10 bilateral; Seated clams with manual resistance x 10; Seated adductor squeeze with manual resistance x 10; Heel raises without UE support x 10;  Sit to stand without UE support from regular height chair with 4# medicine ball at chest 2 x 10;   Neuromuscular Re-education  NBOS eye open/closed x 30s each; Semitandem balance eyes open/closed alternating forward LE x 30s each; Toe taps to 6" step alternating LE x 10 each; Airex NBOS eyes open/closed x 30s each; Airex NBOS ball pass around body with head and trunk rotation x 10 to each side;   Pt educated throughout session about proper posture and technique  with exercises. Improved exercise technique, movement at target joints, use of target muscles after min to mod verbal, visual, tactile cues.     Pt demonstrates good motivation with exercises during session. She does have increased anxiety regarding falls during balance training and requires frequent reassurance. CGA/minA+1 provided throughout balance training. She is able to complete sit to stand exercise with weighted medicine ball. Pt encouraged to continue HEP and follow-up as scheduled. Pt will benefit from PT services to address deficits in strength, balance, and mobility in order to return to full function at home.                        PT Short Term Goals - 05/11/18 1157      PT SHORT TERM GOAL #1   Title  Patient will be adherent to HEP at least 3x a week to improve functional strength and balance for better safety at home.    Time  4    Period  Weeks    Status  New    Target Date  06/08/18      PT SHORT TERM GOAL #2   Title   Patient will deny any falls over past 4 weeks to demonstrate improved safety awareness at home and work.     Time  4    Period  Weeks    Status  New    Target Date  06/08/18        PT Long Term Goals - 05/11/18 1158      PT LONG TERM GOAL #1   Title  Patient will increase Berg Balance score by > 6 points to demonstrate decreased fall risk during functional activities.    Time  8    Period  Weeks    Status  New    Target Date  07/06/18      PT LONG TERM GOAL #2   Title  Patient will increase 10 meter walk test to >0.8 m/s as to improve gait speed for better community ambulation and to reduce fall risk.    Time  8    Period  Weeks    Status  New    Target Date  07/06/18      PT LONG TERM GOAL #3   Title  Patient will be require no assist with ascend/descend 3 steps using Least restrictive assistive device to improve safety with entry/exit home.     Time  8    Period  Weeks    Status  New    Target Date  07/06/18             Plan - 05/20/18 0928    Clinical Impression Statement  Pt  demonstrates good motivation with exercises during session. She does have increased anxiety regarding falls during balance training and requires frequent reassurance. CGA/minA+1 provided throughout balance training. She is able to complete sit to stand exercise with weighted medicine ball. Pt encouraged to continue HEP and follow-up as scheduled. Pt will benefit from PT services to address deficits in strength, balance, and mobility in order to return to full function at home.     Personal Factors and Comorbidities  Age;Time since onset of injury/illness/exacerbation;Comorbidity 3+    Examination-Activity Limitations  Bend;Caring for Others;Carry;Continence;Dressing;Lift;Locomotion Level;Stairs;Stand;Transfers    Examination-Participation Restrictions  Cleaning;Community Activity;Driving;Laundry;Meal Prep;Shop;Yard Work    Stability/Clinical Decision Making  Evolving/Moderate complexity    Rehab Potential  Good    PT Frequency  2x / week    PT Duration  8 weeks    PT Treatment/Interventions  Cryotherapy;Electrical Stimulation;Moist Heat;Gait training;Stair training;Functional mobility training;Therapeutic activities;Therapeutic exercise;Balance training;Neuromuscular re-education;Patient/family education;Energy conservation    PT Next Visit Plan  Continue to work on balance activities and strengthening at next visit       Patient will benefit from skilled therapeutic intervention in order to improve the following deficits and impairments:  Abnormal gait, Decreased balance, Decreased endurance, Decreased mobility, Difficulty walking, Impaired perceived functional ability, Decreased activity tolerance, Decreased safety awareness  Visit Diagnosis: Unsteadiness on feet  Difficulty in walking, not elsewhere classified  Muscle weakness (generalized)     Problem List Patient Active Problem List   Diagnosis Date Noted  .  NPH (normal pressure hydrocephalus) ([place]) 07/30/2016  . Left leg weakness 04/20/2016  . Menopausal osteoporosis 03/04/2016  . Abnormal gait 12/18/2015  . Left foot drop 12/18/2015  . BP (high blood pressure) 08/24/2015  . Hydronephrosis, left 07/28/2015  . UPJ (ureteropelvic junction) obstruction 07/28/2015  . UTI (lower urinary tract infection) 07/14/2015  . Hydronephrosis 07/14/2015   Phillips Grout PT, DPT, GCS  Abigail Hill 05/20/2018, 10:34 AM  [place] MAIN [facility] SERVICES [address] [place], [place], [address] Phone: [phone]   Fax:  [phone]  Name: Abigail Hill MRN: [id number] Date of Birth: [birth year]

## 2018-05-25 ENCOUNTER — Ambulatory Visit: Payer: PPO | Admitting: Physical Therapy

## 2018-05-25 NOTE — Therapy (Signed)
[place] MAIN [facility] SERVICES [address] [place], [place], [address] Phone: [phone]   Fax:  [phone]  Patient Details  Name: Abigail Hill MRN: [id number] Date of Birth: 1950/06/27 Referring Provider:  No ref. provider found  Encounter Date: 05/25/2018   DPT called patient to let patient know the status of the clinic and ensure that we will be reaching out to schedule when we re-open. DPT fielded any questions patient or significant other had at this time and offered guidance on current home program as necessary. Patient had no further questions at this time, and DPT advised that should questions arise patient can reach out to the clinic for guidance via phone.  Lieutenant Diego PT, DPT 9:37 AM,05/25/18 [place] MAIN [facility] SERVICES [address] [place], [place], [address] Phone: [phone]   Fax:  [phone]

## 2018-05-27 ENCOUNTER — Ambulatory Visit: Payer: PPO | Admitting: Physical Therapy

## 2018-05-31 ENCOUNTER — Ambulatory Visit: Payer: PPO | Admitting: Physical Therapy

## 2018-06-02 ENCOUNTER — Ambulatory Visit: Payer: PPO | Admitting: Physical Therapy

## 2018-06-14 ENCOUNTER — Ambulatory Visit: Payer: PPO | Admitting: Physical Therapy

## 2018-06-16 ENCOUNTER — Ambulatory Visit: Payer: PPO | Admitting: Physical Therapy

## 2018-06-17 ENCOUNTER — Encounter: Payer: Self-pay | Admitting: Physical Therapy

## 2018-06-17 NOTE — Therapy (Signed)
[place] MAIN [facility] SERVICES [address] [place], [place], [address] Phone: [phone]   Fax:  [phone]  Patient Details  Name: Abigail Hill MRN: [id number] Date of Birth: 03-Dec-1950 Referring Provider:  No ref. provider found  Encounter Date: 06/17/2018   The patient has been contacted today in regards to telehealth services. The patient expressed an interest in participating in telehealth visits. She possesses the technology to participate and her son, who lives with patient, can assist if needed. Patient has been informed that an [facility] support representative will be reaching out to them to verify their insurance benefits and for scheduling        Phillips Grout PT, DPT, GCS  Saleem Coccia 06/17/2018, 11:08 AM  [place], [place], [address] Phone: [phone]   Fax:  [phone]

## 2018-06-17 NOTE — Therapy (Signed)
[place] MAIN [facility] SERVICES [address] [place], [place], [address] Phone: [phone]   Fax:  [phone]  Patient Details  Name: Abigail Hill MRN: [id number] Date of Birth: 26-Nov-1950 Referring Provider:  Pieter Partridge, DO  Encounter Date: 05/25/2018   The patient has been contacted today in regards to telehealth services. The patient expressed an interest in participating in telehealth visits. She has the technology to be able to participate in the visit and her son lives with her who can also assist. Patient has been informed that an [facility] support representative will be reaching out to them to verify their insurance benefits and for scheduling      Phillips Grout PT, DPT, GCS  Huprich,Jason 06/17/2018, 11:07 AM  [place] [address] [place], [place], [address] Phone: [phone]   Fax:  [phone]

## 2018-06-21 ENCOUNTER — Ambulatory Visit: Payer: PPO | Admitting: Physical Therapy

## 2018-06-23 ENCOUNTER — Ambulatory Visit: Payer: PPO | Admitting: Physical Therapy

## 2018-06-28 ENCOUNTER — Ambulatory Visit: Payer: PPO | Admitting: Physical Therapy

## 2018-06-28 ENCOUNTER — Telehealth: Payer: Self-pay | Admitting: Urology

## 2018-06-28 NOTE — Telephone Encounter (Signed)
Please advise on pt's request to be seen sooner. Thanks

## 2018-06-28 NOTE — Telephone Encounter (Signed)
Happy to see her via Virtual visit tomorrow to discuss further if she is willing.  Please arrange.  Hollice Espy, MD

## 2018-06-28 NOTE — Telephone Encounter (Signed)
Called to schedule no answer and no voicemail Also she was supposed to get a RUS prior to that visit. Does she still need to get the [place] or can that wait?   Sharyn Lull

## 2018-06-28 NOTE — Telephone Encounter (Signed)
Patient calling the office today requesting a sooner appt.  She has a 1-year follow up in July 2020.  She would like to get some resolution incontinence and recent blood spotting.    Please advise.

## 2018-06-29 ENCOUNTER — Telehealth: Payer: PPO | Admitting: Urology

## 2018-06-29 NOTE — Telephone Encounter (Signed)
Office visit appointment made per Dr Erlene Quan.

## 2018-06-29 NOTE — Telephone Encounter (Signed)
Her current symptoms sound unrelated to her kidney issue.  I think it is fine to hold off on ultrasound now and we can address her more acute issues first.  Hollice Espy, MD

## 2018-06-30 ENCOUNTER — Other Ambulatory Visit: Payer: Self-pay

## 2018-06-30 ENCOUNTER — Ambulatory Visit: Payer: PPO | Admitting: Physical Therapy

## 2018-06-30 ENCOUNTER — Encounter: Payer: Self-pay | Admitting: Urology

## 2018-06-30 ENCOUNTER — Ambulatory Visit: Payer: PPO | Admitting: Urology

## 2018-06-30 VITALS — BP 148/87 | HR 90 | Ht 61.5 in | Wt 159.0 lb

## 2018-06-30 DIAGNOSIS — B379 Candidiasis, unspecified: Secondary | ICD-10-CM | POA: Diagnosis not present

## 2018-06-30 DIAGNOSIS — N3941 Urge incontinence: Secondary | ICD-10-CM | POA: Diagnosis not present

## 2018-06-30 LAB — URINALYSIS, COMPLETE
Bilirubin, UA: NEGATIVE
Glucose, UA: NEGATIVE
Ketones, UA: NEGATIVE
Nitrite, UA: NEGATIVE
Protein,UA: NEGATIVE
RBC, UA: NEGATIVE
Specific Gravity, UA: 1.02 (ref 1.005–1.030)
Urobilinogen, Ur: 0.2 mg/dL (ref 0.2–1.0)
pH, UA: 5.5 (ref 5.0–7.5)

## 2018-06-30 LAB — BLADDER SCAN AMB NON-IMAGING

## 2018-06-30 LAB — MICROSCOPIC EXAMINATION
Bacteria, UA: NONE SEEN
RBC, Urine: NONE SEEN /hpf (ref 0–2)

## 2018-06-30 MED ORDER — NYSTATIN-TRIAMCINOLONE 100000-0.1 UNIT/GM-% EX OINT: 1.0000 "application " | TOPICAL_OINTMENT | Freq: Two times a day (BID) | CUTANEOUS | 0 refills | Status: DC

## 2018-06-30 NOTE — Progress Notes (Signed)
06/30/2018 9:07 AM   Abigail Hill Nov 22, 1950 [id number]  Referring provider: Baxter Hire, MD [place],  [place] [address]  Chief Complaint  Patient presents with  . Urinary Incontinence    HPI: 68 year old female who returns sooner than expected to discuss specifically urinary symptoms.  She reports today, that she has been struggling with worsening urinary urgency, frequency, and urge incontinence.  This is been an ongoing issue for the last several years but seems to be worsening.  In the past, she is elected to defer pharmacotherapy.  Notably, she was initially diagnosed with normal pressure hydrocephalus but had no improvement with therapy and ultimately this diagnosis was abandoned.  She continues to have physical therapy for unsteady gait.  Inability to get the bathroom on time is contributing to her urinary symptoms.  She denies any stress urinary incontinence.   No dysuria or gross hematuria.    She also notes a rash today in her suprapubic and inguinal area which she would like [place] to take a look at.  She is also followed for history of chronic left UPJ obstruction.  Please see previous notes for details.  She is followed with serial renal ultrasounds which is due in the near future.   PMH: Past Medical History:  Diagnosis Date  . Abnormal gait 12/18/2015  . BP (high blood pressure) 08/24/2015  . Cancer ([place])    skin  . Constriction of ureter (postoperative) 07/28/2015  . Hydronephrosis 07/14/2015  . Hypertension   . Left foot drop 12/18/2015  . Recurrent UTI   . UPJ (ureteropelvic junction) obstruction 07/28/2015  . UTI (lower urinary tract infection) 07/14/2015    Surgical History: Past Surgical History:  Procedure Laterality Date  . broken foot  2012  . COLONOSCOPY WITH PROPOFOL N/A 06/30/2017   Procedure: COLONOSCOPY WITH PROPOFOL;  Surgeon: Lollie Sails, MD;  Location: [facility] ENDOSCOPY;  Service: Endoscopy;  Laterality: N/A;  .  kidney repair  1985   repaired torn blood vessel in kidney    Home Medications:  Allergies as of 06/30/2018      Reactions   Oxycodone       Medication List       Accurate as of June 30, 2018 11:59 PM. If you have any questions, ask your nurse or doctor.        STOP taking these medications   hydrochlorothiazide 12.5 MG tablet Commonly known as: HYDRODIURIL Stopped by: Hollice Espy, MD     TAKE these medications   alendronate 70 MG tablet Commonly known as: FOSAMAX   aspirin 81 MG chewable tablet Chew 81 mg by mouth daily.   CALTRATE PLUS PO Take by mouth.   carbidopa-levodopa 25-100 MG tablet Commonly known as: SINEMET IR Take 1 tablet by mouth 3 (three) times daily.   CENTRUM SILVER PO Take by mouth.   nystatin-triamcinolone ointment Commonly known as: MYCOLOG Apply 1 application topically 2 (two) times daily. Started by: Hollice Espy, MD       Allergies:  Allergies  Allergen Reactions  . Oxycodone     Family History: Family History  Problem Relation Age of Onset  . Kidney disease Neg Hx   . Bladder Cancer Neg Hx     Social History:  reports that she has quit smoking. She has never used smokeless tobacco. She reports that she does not drink alcohol or use drugs.  ROS: UROLOGY Frequent Urination?: Yes Hard to postpone urination?: Yes Burning/pain with urination?: No Get  up at night to urinate?: Yes Leakage of urine?: No Urine stream starts and stops?: No Trouble starting stream?: No Do you have to strain to urinate?: No Blood in urine?: No Urinary tract infection?: No Sexually transmitted disease?: No Injury to kidneys or bladder?: No Painful intercourse?: No Weak stream?: No Currently pregnant?: No Vaginal bleeding?: Yes Last menstrual period?: n  Gastrointestinal Nausea?: No Vomiting?: No Indigestion/heartburn?: No Diarrhea?: No Constipation?: No  Constitutional Fever: No Night sweats?: No Weight loss?: No Fatigue?:  No  Skin Skin rash/lesions?: No Itching?: No  Eyes Blurred vision?: No Double vision?: No  Ears/Nose/Throat Sore throat?: No Sinus problems?: No  Hematologic/Lymphatic Swollen glands?: No Easy bruising?: No  Cardiovascular Leg swelling?: No Chest pain?: No  Respiratory Cough?: No Shortness of breath?: No  Endocrine Excessive thirst?: No  Musculoskeletal Back pain?: No Joint pain?: No  Neurological Headaches?: No Dizziness?: No  Psychologic Depression?: No Anxiety?: No  Physical Exam: BP (!) 148/87   Pulse 90   Ht 5' 1.5" (1.562 m)   Wt 159 lb (72.1 kg)   BMI 29.56 kg/m   Constitutional:  Alert and oriented, No acute distress. HEENT: [place] AT, moist mucus membranes.  Trachea midline, no masses. Cardiovascular: No clubbing, cyanosis, or edema. Respiratory: Normal respiratory effort, no increased work of breathing. GI: Abdomen is soft, nontender, nondistended, no abdominal masses Pelvic: Chaperoned by Fonnie Jarvis, MA.  Mild erythema of the mons pubis and inguinal folds consistent with possible Candida.  Small urethral caruncle appreciated with atrophic vaginal tissue.  Fairly decent vaginal support without obvious cystocele or rectocele. Skin: No rashes, bruises or suspicious lesions. Neurologic: Grossly intact, no focal deficits, moving all 4 extremities. Psychiatric: Normal mood and affect.  Laboratory Data: Lab Results  Component Value Date   WBC 13.8 (H) 06/13/2015   HGB 13.4 06/13/2015   HCT 39.6 06/13/2015   MCV 92.0 06/13/2015   PLT 138 (L) 06/13/2015    Lab Results  Component Value Date   CREATININE 0.99 07/10/2015    Urinalysis Results for orders placed or performed in visit on 06/30/18  Microscopic Examination   URINE  Result Value Ref Range   WBC, UA 0-5 0 - 5 /hpf   RBC None seen 0 - 2 /hpf   Epithelial Cells (non renal) 0-10 0 - 10 /hpf   Bacteria, UA None seen None seen/Few  Urinalysis, Complete  Result Value Ref Range    Specific Gravity, UA 1.020 1.005 - 1.030   pH, UA 5.5 5.0 - 7.5   Color, UA Yellow Yellow   Appearance Ur Clear Clear   Leukocytes,UA Trace (A) Negative   Protein,UA Negative Negative/Trace   Glucose, UA Negative Negative   Ketones, UA Negative Negative   RBC, UA Negative Negative   Bilirubin, UA Negative Negative   Urobilinogen, Ur 0.2 0.2 - 1.0 mg/dL   Nitrite, UA Negative Negative   Microscopic Examination See below:   BLADDER SCAN AMB NON-IMAGING  Result Value Ref Range   Scan Result 84ml      Assessment & Plan:    1. Urge incontinence Behavioral modification was discussed again today Suspect urge incontinence exacerbated with difficulty ambulating to the bathroom, working with physical therapy Today she is agreeable to try Myrbetriq, samples of 25 mg daily were given x1 month She will call let us know if this is effective No evidence of incomplete bladder emptying - Urinalysis, Complete - BLADDER SCAN AMB NON-IMAGING  2. Candidiasis Trial of Mycolog cream to affected area  F/u 3 months as scheduled for RUS/ recheck urinary symptoms/ PVR   Hollice Espy, MD  [facility] [address] [place], [place] [address] [phone]  I spent 25 min with this patient of which greater than 50% was spent in counseling and coordination of care with the patient.

## 2018-07-05 ENCOUNTER — Ambulatory Visit: Payer: PPO | Admitting: Physical Therapy

## 2018-07-07 ENCOUNTER — Ambulatory Visit: Payer: PPO | Admitting: Physical Therapy

## 2018-07-12 ENCOUNTER — Ambulatory Visit: Payer: PPO | Admitting: Physical Therapy

## 2018-07-14 ENCOUNTER — Ambulatory Visit: Payer: PPO | Admitting: Physical Therapy

## 2018-07-19 ENCOUNTER — Ambulatory Visit: Payer: PPO | Admitting: Physical Therapy

## 2018-07-20 ENCOUNTER — Encounter: Payer: Self-pay | Admitting: Physical Therapy

## 2018-07-20 ENCOUNTER — Other Ambulatory Visit: Payer: Self-pay

## 2018-07-20 ENCOUNTER — Ambulatory Visit: Payer: PPO | Attending: Neurology | Admitting: Physical Therapy

## 2018-07-20 DIAGNOSIS — R262 Difficulty in walking, not elsewhere classified: Secondary | ICD-10-CM | POA: Diagnosis not present

## 2018-07-20 DIAGNOSIS — R2681 Unsteadiness on feet: Secondary | ICD-10-CM | POA: Diagnosis not present

## 2018-07-20 DIAGNOSIS — M6281 Muscle weakness (generalized): Secondary | ICD-10-CM

## 2018-07-20 NOTE — Therapy (Signed)
[facility] [address] [place], [place], [address] Phone: [phone]   Fax:  [phone]  Physical Therapy Treatment/Re-evaluation  Patient Details  Name: Abigail Hill MRN: [id number] Date of Birth: 1950/04/25 Referring Provider (PT): Dr. Metta Clines   Encounter Date: 07/20/2018  PT End of Session - 07/20/18 1107    Visit Number  5    Number of Visits  22    Date for PT Re-Evaluation  09/14/18    Authorization Type  goals addressed 07/20/18    PT Start Time  1027    PT Stop Time  1115    PT Time Calculation (min)  48 min    Equipment Utilized During Treatment  Gait belt    Activity Tolerance  Patient tolerated treatment well    Behavior During Therapy  [facility] for tasks assessed/performed       Past Medical History:  Diagnosis Date  . Abnormal gait 12/18/2015  . BP (high blood pressure) 08/24/2015  . Cancer ([place])    skin  . Constriction of ureter (postoperative) 07/28/2015  . Hydronephrosis 07/14/2015  . Hypertension   . Left foot drop 12/18/2015  . Recurrent UTI   . UPJ (ureteropelvic junction) obstruction 07/28/2015  . UTI (lower urinary tract infection) 07/14/2015    Past Surgical History:  Procedure Laterality Date  . broken foot  2012  . COLONOSCOPY WITH PROPOFOL N/A 06/30/2017   Procedure: COLONOSCOPY WITH PROPOFOL;  Surgeon: Lollie Sails, MD;  Location: [facility] ENDOSCOPY;  Service: Endoscopy;  Laterality: N/A;  . kidney repair  1985   repaired torn blood vessel in kidney    There were no vitals filed for this visit.  Subjective Assessment - 07/20/18 1031    Subjective  Patient reports doing well staying at home over last several weeks. She reports having 1 fall 3 weeks ago. She reports she was walking in the kitchen and fell by the refrigerator. She presents to therapy with RW and states she has been using that regularly; Denies any other new changes;     Pertinent History  68 yo Female reports  instability with gait with difficulty walking. She states, "my left leg gives me trouble. It will  just plant and then I can't pick it up." Pt reports 2 falls in last month. She reports when she falls it is often falling backwards; She was diagnosed with hydrocephalus and did a drain trial with no relief; She did not qualify for a shunt. Repeat imaging showed continued hydrocephalus that has not worsened; MRI of lumbar spine shows L5 pars defect with sacral insufficiency; She denies any back pain and reports no numbness in legs. She reports feeling frustration because they can't seem to find what's wrong with her. She does have urinary incontience; reports knowing when she has to go to the bathroom but can't get there fast enough; Patient has a walker and rollator; She uses both; She also has a manual wheelchair that she uses intermittently;     Limitations  Standing;Walking    How long can you stand comfortably?  has pain in LLE with prolonged stooping; <10 min;     How long can you walk comfortably?  200 feet with assistance requiring several breaks;     Diagnostic tests  has had MRI and brain imaging;     Patient Stated Goals  "Be able to travel more, little sight seeing; walk short distances" Patient reports that she would like to be able  to get rid of the walker.     Currently in Pain?  No/denies    Multiple Pain Sites  No         [facility] PT Assessment - 07/20/18 0001      Assessment   Medical Diagnosis  Gait instability    Referring Provider (PT)  Dr. Metta Clines    Hand Dominance  Right    Prior Therapy  Started outpatient PT in March 2020, but had to stop due to COVID closure;       Precautions   Precautions  Fall      Restrictions   Weight Bearing Restrictions  No      Balance Screen   Has the patient fallen in the past 6 months  Yes    How many times?  3    Has the patient had a decrease in activity level because of a fear of falling?   Yes    Is the patient reluctant to leave  their home because of a fear of falling?   Yes      Strength   Right Hip Flexion  4/5    Right Hip Extension  2+/5    Right Hip ABduction  4-/5    Right Hip ADduction  3-/5    Left Hip Flexion  4+/5    Left Hip Extension  2+/5    Left Hip ABduction  4-/5    Left Hip ADduction  3-/5    Right Knee Flexion  4/5    Right Knee Extension  4+/5    Left Knee Flexion  4/5    Left Knee Extension  4+/5    Right Ankle Dorsiflexion  4+/5    Left Ankle Dorsiflexion  4+/5      Ambulation/Gait   Gait Comments  when walking without RW, patient exhibits wide base of support, she ambulate with heavy toe hit at initial contact with forward weight shift with poor foot clearance;       Standardized Balance Assessment   10 Meter Walk  0.44 m/s with RW, 0.52 m/s without AD both indicate limited home ambulator, high fall risk      Berg Balance Test   Sit to Stand  Able to stand without using hands and stabilize independently    Standing Unsupported  Able to stand safely 2 minutes    Sitting with Back Unsupported but Feet Supported on Floor or Stool  Able to sit safely and securely 2 minutes    Stand to Sit  Sits safely with minimal use of hands    Transfers  Able to transfer safely, minor use of hands    Standing Unsupported with Eyes Closed  Able to stand 10 seconds with supervision    Standing Unsupported with Feet Together  Needs help to attain position but able to stand for 30 seconds with feet together    From Standing, Reach Forward with Outstretched Arm  Can reach confidently >25 cm (10")    From Standing Position, Pick up Object from Floor  Unable to pick up shoe, but reaches 2-5 cm (1-2") from shoe and balances independently    From Standing Position, Turn to Look Behind Over each Shoulder  Looks behind one side only/other side shows less weight shift    Turn 360 Degrees  Able to turn 360 degrees safely but slowly    Standing Unsupported, Alternately Place Feet on Step/Stool  Able to complete >2  steps/needs minimal assist    Standing Unsupported,  One Foot in [place] to take small step independently and hold 30 seconds    Standing on One Leg  Tries to lift leg/unable to hold 3 seconds but remains standing independently    Total Score  39    Berg comment:  80% fall risk, slight impairement from 05/11/18 which was 41/56         TREATMENT: PT performed Re-evaluation to address goals and establish baseline with new goals for plan of care.  PT instructed patient in LE strengthening as part of HEP: Standing with red tband around BLE: Hip abduction x15 bilaterally; Hip extension x15 bilaterally; Side stepping x5 feet x1 set each direction; Patient required min-moderate verbal/tactile cues for correct exercise technique. Patient able to safely don/doff red tband for HEP; Provided written HEP for better adherence;   The patient has been informed of current processes in place at Outpatient Rehab to protect patients from Covid-19 exposure including social distancing, schedule modifications, and new cleaning procedures. After discussing their particular risk with a therapist based on the patient's personal risk factors, the patient has decided to proceed with in-person therapy.                   PT Education - 07/20/18 1107    Education Details  HEP reinforced/advanced, LE strengthening, plan of care    Person(s) Educated  Patient    Methods  Explanation;Verbal cues;Handout    Comprehension  Verbalized understanding;Returned demonstration;Verbal cues required;Need further instruction       PT Short Term Goals - 07/20/18 1050      PT SHORT TERM GOAL #1   Title  Patient will be adherent to HEP at least 3x a week to improve functional strength and balance for better safety at home.    Time  4    Period  Weeks    Status  Partially Met    Target Date  08/17/18      PT SHORT TERM GOAL #2   Title   Patient will deny any falls over past 4 weeks to demonstrate  improved safety awareness at home and work.     Time  4    Period  Weeks    Status  Not Met    Target Date  08/17/18        PT Long Term Goals - 07/20/18 1051      PT LONG TERM GOAL #1   Title  Patient will increase Berg Balance score by > 6 points to demonstrate decreased fall risk during functional activities.    Time  8    Period  Weeks    Status  New    Target Date  09/14/18      PT LONG TERM GOAL #2   Title  Patient will increase 10 meter walk test to >0.8 m/s as to improve gait speed for better community ambulation and to reduce fall risk.    Time  8    Period  Weeks    Status  New    Target Date  09/14/18      PT LONG TERM GOAL #3   Title  Patient will be require no assist with ascend/descend 3 steps using Least restrictive assistive device to improve safety with entry/exit home.     Time  8    Period  Weeks    Status  New    Target Date  09/14/18      PT LONG TERM GOAL #4   Title  Patient will increase BLE gross strength to 4+/5 as to improve functional strength for independent gait, increased standing tolerance and increased ADL ability.    Time  8    Period  Weeks    Status  New    Target Date  09/14/18            Plan - 07/20/18 1118    Clinical Impression Statement  Patient has missed several PT appointments due to [facility] clinic closure. She has decided to return to outpatient therapy as we have re-opened our clinic. Outcome measures were performed to address current functional mobility level. Patient reports adherence to HEP, however despite adherence, continues to have weakness in BLE and imbalance and gait abnormality;  Patient exhibits slightly increased weakness in BLE more in posterior hip along gluteals and hamstrings which could be contributing to imbalance with decreased SLS ability; Patient also tested as slightly higher fall risk as compared to previous visits. She would benefit from skilled PT Intervention to improve strength, balance and gait  safety;     Personal Factors and Comorbidities  Age;Time since onset of injury/illness/exacerbation;Comorbidity 3+    Examination-Activity Limitations  Bend;Caring for Others;Carry;Continence;Dressing;Lift;Locomotion Level;Stairs;Stand;Transfers    Examination-Participation Restrictions  Cleaning;Community Activity;Driving;Laundry;Meal Prep;Shop;Yard Work    Stability/Clinical Decision Making  Evolving/Moderate complexity    Rehab Potential  Good    PT Frequency  2x / week    PT Duration  8 weeks    PT Treatment/Interventions  Cryotherapy;Electrical Stimulation;Moist Heat;Gait training;Stair training;Functional mobility training;Therapeutic activities;Therapeutic exercise;Balance training;Neuromuscular re-education;Patient/family education;Energy conservation    PT Next Visit Plan  Continue to work on balance activities and strengthening at next visit       Patient will benefit from skilled therapeutic intervention in order to improve the following deficits and impairments:  Abnormal gait, Decreased balance, Decreased endurance, Decreased mobility, Difficulty walking, Impaired perceived functional ability, Decreased activity tolerance, Decreased safety awareness  Visit Diagnosis: Unsteadiness on feet  Difficulty in walking, not elsewhere classified  Muscle weakness (generalized)     Problem List Patient Active Problem List   Diagnosis Date Noted  . NPH (normal pressure hydrocephalus) ([place]) 07/30/2016  . Left leg weakness 04/20/2016  . Menopausal osteoporosis 03/04/2016  . Abnormal gait 12/18/2015  . Left foot drop 12/18/2015  . BP (high blood pressure) 08/24/2015  . Hydronephrosis, left 07/28/2015  . UPJ (ureteropelvic junction) obstruction 07/28/2015  . UTI (lower urinary tract infection) 07/14/2015  . Hydronephrosis 07/14/2015    Jonus Coble PT, DPT 07/20/2018, 11:20 AM  [place] [facility] SERVICES [address]  [place], [place], [address] Phone: [phone]   Fax:  [phone]  Name: Abigail Hill MRN: [id number] Date of Birth: [birth year]

## 2018-07-20 NOTE — Patient Instructions (Signed)
Access Code: [email or web address]  URL: [email or web address]  Date: 07/20/2018  Prepared by: Blanche East   Exercises  Standing Hip Abduction with Resistance at Ankles and Counter Support - 15 reps - 2 sets - 1x daily - 7x weekly  Side Stepping with Resistance at Feet - 15 reps - 2 sets - 1x daily - 7x weekly  Standing Hip Extension with Resistance at Ankles and Counter Support - 15 reps - 2 sets - 1x daily - 7x weekly

## 2018-07-21 NOTE — Progress Notes (Signed)
Virtual Visit via Video Note The purpose of this virtual visit is to provide medical care while limiting exposure to the novel coronavirus.    Consent was obtained for video visit:  Yes Answered questions that patient had about telehealth interaction:  Yes I discussed the limitations, risks, security and privacy concerns of performing an evaluation and management service by telemedicine. I also discussed with the patient that there may be a patient responsible charge related to this service. The patient expressed understanding and agreed to proceed.  Pt location: Home Physician Location: Home Name of referring provider:  Baxter Hire, MD I connected with Abigail Hill at patients initiation/request on 07/22/2018 at 10:30 AM EDT by video enabled telemedicine application and verified that I am speaking with the correct person using two identifiers. [name] Pt DOB:  Jul 14, 1950 Video Participants:  Abigail Hill   History of Present Illness:  Abigail Hill is a 68 year old right-handed Caucasian woman who follows up for  Unsteady gait.  She is accompanied by her husband and daughter who supplement history.  UPDATE: She had started physical therapy.  She had 4 sessions but was then cancelled due to COVID-19.  She just restarted this week.  Her PT said her problem is her hips.  She was referred to the [facility] at [facility].  Appointment is for June.  She had one fall since last visit.  HISTORY: Since 2015-2016, she reports problems with balance and gait. She feels like she leans towards the left and tends to drag her left leg. There is no associated low back pain, radicular pain, or numbness. Over time, she started having increased falls. Around the same time, she started having urinary incontinence. It would occur while trying to reach the bathroom. She denied bowel dysfunction. She saw a neurologist in early 2018 and was found to have a foot drop, which  was initially trated with an AFO and physical therapy. A NCV-EMG was ordered but never performed. She subsequently had an MRI of the brain without contrast on 05/15/16, which was personally reviewed, which showed chronic hydrocephalus as demonstrated by moderate lateral and third ventriculomegaly with transependymal flow, slightly worse compared to a prior head CT from 04/29/09. MRI of lumbar spine from 06/19/16 showed sacral insufficiency fractures involving the left ala and S3 body and L5 chronic pars defect with disc degeneration but no nerve root impingement. She was seen by neurosurgery on 07/03/16, who felt that her symptoms were related to NPH rather than lumbosacral etiology and she was referred for a lumbar drain trial and consideration of VP shunt. She was admitted to [facility] in August 2018 for lumbar drain trial which was unsuccessful.X-rays of the hip were unremarkable.MRI of cervical spine from 06/28/17 showed multilevel degenerative changes with mild spinal stenosis from C3-4 to C5-6 without abnormal cord signal or significant mass effect.  She underwent continued workup for abnormal gait:  NCV-EMG of the lower extremities from 10/20/17 was normal.  MRI of brain with and without contrast from 11/09/17 demonstrated chronic communicating hydrocephalus pattern, unchanged since March 2018.  She was then started on carbidopa-levodopa trial for possible Parkinson's disease.  DaTscan was performed on 03/11/18, which was normal.  Carbidopa-levodopa was ineffective and she says she feels better off of it.   Past Medical History: Past Medical History:  Diagnosis Date   Abnormal gait 12/18/2015   BP (high blood pressure) 08/24/2015   Cancer ([place])    skin  Constriction of ureter (postoperative) 07/28/2015   Hydronephrosis 07/14/2015   Hypertension    Left foot drop 12/18/2015   Recurrent UTI    UPJ (ureteropelvic junction) obstruction 07/28/2015   UTI (lower urinary tract infection) 07/14/2015     Medications: Outpatient Encounter Medications as of 07/22/2018  Medication Sig Note   alendronate (FOSAMAX) 70 MG tablet     aspirin 81 MG chewable tablet Chew 81 mg by mouth daily.    Calcium Carbonate-Vit D-Min (CALTRATE PLUS PO) Take by mouth.    carbidopa-levodopa (SINEMET IR) 25-100 MG tablet Take 1 tablet by mouth 3 (three) times daily.    Multiple Vitamins-Minerals (CENTRUM SILVER PO) Take by mouth. 07/10/2015: Received from: [place]   nystatin-triamcinolone ointment [facility]) Apply 1 application topically 2 (two) times daily.    No facility-administered encounter medications on file as of 07/22/2018.     Allergies: Allergies  Allergen Reactions   Oxycodone     Family History: Family History  Problem Relation Age of Onset   Kidney disease Neg Hx    Bladder Cancer Neg Hx     Social History: Social History   Socioeconomic History   Marital status: Married    Spouse name: Not on file   Number of children: Not on file   Years of education: Not on file   Highest education level: Not on file  Occupational History   Not on file  Social Needs   Financial resource strain: Not on file   Food insecurity:    Worry: Not on file    Inability: Not on file   Transportation needs:    Medical: Not on file    Non-medical: Not on file  Tobacco Use   Smoking status: Former Smoker   Smokeless tobacco: Never Used   Tobacco comment: quit 10 years  Substance and Sexual Activity   Alcohol use: Yes    Alcohol/week: 0.0 standard drinks   Drug use: No   Sexual activity: Yes  Lifestyle   Physical activity:    Days per week: Not on file    Minutes per session: Not on file   Stress: Not on file  Relationships   Social connections:    Talks on phone: Not on file    Gets together: Not on file    Attends religious service: Not on file    Active member of club or organization: Not on file    Attends meetings of clubs or  organizations: Not on file    Relationship status: Not on file   Intimate partner violence:    Fear of current or ex partner: Not on file    Emotionally abused: Not on file    Physically abused: Not on file    Forced sexual activity: Not on file  Other Topics Concern   Not on file  Social History Narrative   Not on file   Observations/Objective:   Height 5' 1.5" (1.562 m), weight 160 lb (72.6 kg). No acute distress.  Alert and oriented.  Speech fluent and not dysarthric.  Language intact.  Face symmetric.    Assessment and Plan:   Abnormal gait, which appears magnetic.  I would think hydrocephalus is the cause of her symptoms as all other testing was unrevealing.  However, CSF drain trial was negative.  She does not appear to have Parkinson's disease.  Another possibility would be primary freezing of gait as she describes sometimes inability to pick up her left leg.  However, I think  this is less likely as her gait isn't shuffling.  She will be evaluated at [facility] and follow up with me afterward.  Follow Up Instructions:    -I discussed the assessment and treatment plan with the patient. The patient was provided an opportunity to ask questions and all were answered. The patient agreed with the plan and demonstrated an understanding of the instructions.   The patient was advised to call back or seek an in-person evaluation if the symptoms worsen or if the condition fails to improve as anticipated.    Total Time spent in visit with the patient was:  10 minutes  Dudley Major, DO

## 2018-07-22 ENCOUNTER — Telehealth (INDEPENDENT_AMBULATORY_CARE_PROVIDER_SITE_OTHER): Payer: PPO | Admitting: Neurology

## 2018-07-22 ENCOUNTER — Other Ambulatory Visit: Payer: Self-pay | Admitting: Urology

## 2018-07-22 ENCOUNTER — Encounter: Payer: Self-pay | Admitting: Neurology

## 2018-07-22 ENCOUNTER — Other Ambulatory Visit: Payer: Self-pay

## 2018-07-22 VITALS — Ht 61.5 in | Wt 160.0 lb

## 2018-07-22 DIAGNOSIS — R2681 Unsteadiness on feet: Secondary | ICD-10-CM

## 2018-07-22 MED ORDER — MIRABEGRON ER 25 MG PO TB24: 25.0000 mg | ORAL_TABLET | Freq: Every day | ORAL | 6 refills | Status: DC

## 2018-07-22 NOTE — Telephone Encounter (Signed)
Pt got samples from Dr Erlene Quan for Myrbetriq 25 mg.  She would like an RX sent to [facility] in [place].

## 2018-07-22 NOTE — Telephone Encounter (Signed)
Script sent  

## 2018-07-28 ENCOUNTER — Ambulatory Visit: Payer: PPO | Admitting: Physical Therapy

## 2018-07-29 ENCOUNTER — Other Ambulatory Visit: Payer: Self-pay

## 2018-07-29 ENCOUNTER — Ambulatory Visit: Payer: PPO | Admitting: Physical Therapy

## 2018-07-29 DIAGNOSIS — R262 Difficulty in walking, not elsewhere classified: Secondary | ICD-10-CM

## 2018-07-29 DIAGNOSIS — R2681 Unsteadiness on feet: Secondary | ICD-10-CM | POA: Diagnosis not present

## 2018-07-29 DIAGNOSIS — M6281 Muscle weakness (generalized): Secondary | ICD-10-CM

## 2018-07-29 NOTE — Therapy (Signed)
[place] MAIN [facility] SERVICES [address] [place], [place], [address] Phone: [phone]   Fax:  [phone]  Physical Therapy Treatment  Patient Details  Name: Abigail Hill MRN: [id number] Date of Birth: June 24, 1950 Referring Provider (PT): Dr. Metta Clines   Encounter Date: 07/29/2018  PT End of Session - 07/29/18 1152    Visit Number  6    Number of Visits  22    Date for PT Re-Evaluation  09/14/18    Authorization Type  goals addressed 07/20/18    PT Start Time  1145    PT Stop Time  1225    PT Time Calculation (min)  40 min    Equipment Utilized During Treatment  Gait belt    Activity Tolerance  Patient tolerated treatment well    Behavior During Therapy  [facility] for tasks assessed/performed       Past Medical History:  Diagnosis Date  . Abnormal gait 12/18/2015  . BP (high blood pressure) 08/24/2015  . Cancer ([place])    skin  . Constriction of ureter (postoperative) 07/28/2015  . Hydronephrosis 07/14/2015  . Hypertension   . Left foot drop 12/18/2015  . Recurrent UTI   . UPJ (ureteropelvic junction) obstruction 07/28/2015  . UTI (lower urinary tract infection) 07/14/2015    Past Surgical History:  Procedure Laterality Date  . broken foot  2012  . COLONOSCOPY WITH PROPOFOL N/A 06/30/2017   Procedure: COLONOSCOPY WITH PROPOFOL;  Surgeon: Lollie Sails, MD;  Location: [facility] ENDOSCOPY;  Service: Endoscopy;  Laterality: N/A;  . kidney repair  1985   repaired torn blood vessel in kidney    There were no vitals filed for this visit.  Subjective Assessment - 07/29/18 1145    Subjective  Patient reports doing well staying at home over last several weeks. She reports having 1 fall 3 weeks ago. She reports she was walking in the kitchen and fell by the refrigerator. She presents to therapy with RW and states she has been using that regularly; Denies any other new changes;     Pertinent History  68 yo Female reports instability with gait  with difficulty walking. She states, "my left leg gives me trouble. It will  just plant and then I can't pick it up." Pt reports 2 falls in last month. She reports when she falls it is often falling backwards; She was diagnosed with hydrocephalus and did a drain trial with no relief; She did not qualify for a shunt. Repeat imaging showed continued hydrocephalus that has not worsened; MRI of lumbar spine shows L5 pars defect with sacral insufficiency; She denies any back pain and reports no numbness in legs. She reports feeling frustration because they can't seem to find what's wrong with her. She does have urinary incontience; reports knowing when she has to go to the bathroom but can't get there fast enough; Patient has a walker and rollator; She uses both; She also has a manual wheelchair that she uses intermittently;     Limitations  Standing;Walking    How long can you sit comfortably?  NA    How long can you stand comfortably?  has pain in LLE with prolonged stooping; <10 min;     How long can you walk comfortably?  200 feet with assistance requiring several breaks;     Diagnostic tests  has had MRI and brain imaging;     Patient Stated Goals  "Be able to travel more, little sight seeing; walk  short distances" Patient reports that she would like to be able to get rid of the walker.     Currently in Pain?  Yes    Pain Score  3     Pain Location  Back    Pain Orientation  Lower    Pain Descriptors / Indicators  Aching    Pain Type  Chronic pain    Pain Onset  More than a month ago    Pain Frequency  Intermittent    Aggravating Factors   sitting to standing    Pain Relieving Factors  movement makes it better    Effect of Pain on Daily Activities  no effect    Multiple Pain Sites  No       Ther-ex  NuStep L2 x 5 minutes Level 2 Heel raises with UE support in parallel bars x 20   Standing marching with YTB x 15 Standing hip extension with  YTB x 15 Standing hip abduction with YTB x 15   Standing HS curls 2# ankle weights x 15 Step ups to 6 inch stool x 10  Lunges x 15 l and R in parallel bars Squats x 15 with 5 sec hold   Neuromuscular Re-education  Airex balance with toe taps to 6" step alternating LE x 10 each,  UE support Static balance on 1/2 bolster (flat side up) with  UE support 30s x 2; Standing on 1/2 bolster (Flat side up) heel/toe rock x15 with UE hold for balance but intermittent assist  Matrix x 2 reps fwd/bwd  Patient needs extra time for processing and sequencing and vc for correct technique.                      PT Education - 07/29/18 1152    Education Details  HEP    Person(s) Educated  Patient    Methods  Explanation    Comprehension  Verbalized understanding       PT Short Term Goals - 07/20/18 1050      PT SHORT TERM GOAL #1   Title  Patient will be adherent to HEP at least 3x a week to improve functional strength and balance for better safety at home.    Time  4    Period  Weeks    Status  Partially Met    Target Date  08/17/18      PT SHORT TERM GOAL #2   Title   Patient will deny any falls over past 4 weeks to demonstrate improved safety awareness at home and work.     Time  4    Period  Weeks    Status  Not Met    Target Date  08/17/18        PT Long Term Goals - 07/20/18 1051      PT LONG TERM GOAL #1   Title  Patient will increase Berg Balance score by > 6 points to demonstrate decreased fall risk during functional activities.    Time  8    Period  Weeks    Status  New    Target Date  09/14/18      PT LONG TERM GOAL #2   Title  Patient will increase 10 meter walk test to >0.8 m/s as to improve gait speed for better community ambulation and to reduce fall risk.    Time  8    Period  Weeks    Status  New    Target  Date  09/14/18      PT LONG TERM GOAL #3   Title  Patient will be require no assist with ascend/descend 3 steps using Least restrictive assistive device to improve safety with  entry/exit home.     Time  8    Period  Weeks    Status  New    Target Date  09/14/18      PT LONG TERM GOAL #4   Title  Patient will increase BLE gross strength to 4+/5 as to improve functional strength for independent gait, increased standing tolerance and increased ADL ability.    Time  8    Period  Weeks    Status  New    Target Date  09/14/18            Plan - 07/29/18 1156    Clinical Impression Statement  Patient demonstrates improved stability and strength allowing patient to perform short duration standing interventions with rest periods.  Patient performs beginning standing dynamic standing balance exercises to shift weight and perform single leg standing activities with mod assist. Patient fatigues quickly with exercises requiring rest breaks at this time. Patient will continue to benefit from skilled physical therapy to improve pain and mobility.    Personal Factors and Comorbidities  Age;Time since onset of injury/illness/exacerbation;Comorbidity 3+    Examination-Activity Limitations  Bend;Caring for Others;Carry;Continence;Dressing;Lift;Locomotion Level;Stairs;Stand;Transfers    Examination-Participation Restrictions  Cleaning;Community Activity;Driving;Laundry;Meal Prep;Shop;Yard Work    Stability/Clinical Decision Making  Evolving/Moderate complexity    Rehab Potential  Good    PT Frequency  2x / week    PT Duration  8 weeks    PT Treatment/Interventions  Cryotherapy;Electrical Stimulation;Moist Heat;Gait training;Stair training;Functional mobility training;Therapeutic activities;Therapeutic exercise;Balance training;Neuromuscular re-education;Patient/family education;Energy conservation    PT Next Visit Plan  Continue to work on balance activities and strengthening at next visit       Patient will benefit from skilled therapeutic intervention in order to improve the following deficits and impairments:  Abnormal gait, Decreased balance, Decreased endurance,  Decreased mobility, Difficulty walking, Impaired perceived functional ability, Decreased activity tolerance, Decreased safety awareness  Visit Diagnosis: Unsteadiness on feet  Difficulty in walking, not elsewhere classified  Muscle weakness (generalized)     Problem List Patient Active Problem List   Diagnosis Date Noted  . NPH (normal pressure hydrocephalus) ([place]) 07/30/2016  . Left leg weakness 04/20/2016  . Menopausal osteoporosis 03/04/2016  . Abnormal gait 12/18/2015  . Left foot drop 12/18/2015  . BP (high blood pressure) 08/24/2015  . Hydronephrosis, left 07/28/2015  . UPJ (ureteropelvic junction) obstruction 07/28/2015  . UTI (lower urinary tract infection) 07/14/2015  . Hydronephrosis 07/14/2015    Alanson Puls, PT DPT 07/29/2018, 11:56 AM  [place] MAIN [facility] SERVICES [address] [place], [place], [address] Phone: [phone]   Fax:  [phone]  Name: Abigail Hill MRN: [id number] Date of Birth: 05-12-1950

## 2018-08-02 ENCOUNTER — Other Ambulatory Visit: Payer: Self-pay

## 2018-08-02 ENCOUNTER — Encounter: Payer: Self-pay | Admitting: Physical Therapy

## 2018-08-02 ENCOUNTER — Ambulatory Visit: Payer: PPO | Attending: Neurology | Admitting: Physical Therapy

## 2018-08-02 DIAGNOSIS — R2681 Unsteadiness on feet: Secondary | ICD-10-CM | POA: Insufficient documentation

## 2018-08-02 DIAGNOSIS — M6281 Muscle weakness (generalized): Secondary | ICD-10-CM | POA: Insufficient documentation

## 2018-08-02 DIAGNOSIS — R262 Difficulty in walking, not elsewhere classified: Secondary | ICD-10-CM | POA: Insufficient documentation

## 2018-08-02 NOTE — Therapy (Signed)
[place] MAIN [facility] SERVICES [address] [place], [place], [address] Phone: [phone]   Fax:  [phone]  Physical Therapy Treatment  Patient Details  Name: Abigail Hill MRN: [id number] Date of Birth: 1950-10-19 Referring Provider (PT): Dr. Metta Clines   Encounter Date: 08/02/2018  PT End of Session - 08/02/18 1038    Visit Number  7    Number of Visits  22    Date for PT Re-Evaluation  09/14/18    Authorization Type  goals addressed 07/20/18    PT Start Time  1032    PT Stop Time  1115    PT Time Calculation (min)  43 min    Equipment Utilized During Treatment  Gait belt    Activity Tolerance  Patient tolerated treatment well    Behavior During Therapy  [facility] for tasks assessed/performed       Past Medical History:  Diagnosis Date  . Abnormal gait 12/18/2015  . BP (high blood pressure) 08/24/2015  . Cancer ([place])    skin  . Constriction of ureter (postoperative) 07/28/2015  . Hydronephrosis 07/14/2015  . Hypertension   . Left foot drop 12/18/2015  . Recurrent UTI   . UPJ (ureteropelvic junction) obstruction 07/28/2015  . UTI (lower urinary tract infection) 07/14/2015    Past Surgical History:  Procedure Laterality Date  . broken foot  2012  . COLONOSCOPY WITH PROPOFOL N/A 06/30/2017   Procedure: COLONOSCOPY WITH PROPOFOL;  Surgeon: Lollie Sails, MD;  Location: [facility] ENDOSCOPY;  Service: Endoscopy;  Laterality: N/A;  . kidney repair  1985   repaired torn blood vessel in kidney    There were no vitals filed for this visit.  Subjective Assessment - 08/02/18 1037    Subjective  Patient reports falling yesterday. She reports she was turning around and she just fell. She denies any dizziness when turning but her legs just give out. She was able to get up by herself. Denies any soreness or pain; presents to therapy without walker and without wheelchair;     Pertinent History  68 yo Female reports instability with gait with  difficulty walking. She states, "my left leg gives me trouble. It will  just plant and then I can't pick it up." Pt reports 2 falls in last month. She reports when she falls it is often falling backwards; She was diagnosed with hydrocephalus and did a drain trial with no relief; She did not qualify for a shunt. Repeat imaging showed continued hydrocephalus that has not worsened; MRI of lumbar spine shows L5 pars defect with sacral insufficiency; She denies any back pain and reports no numbness in legs. She reports feeling frustration because they can't seem to find what's wrong with her. She does have urinary incontience; reports knowing when she has to go to the bathroom but can't get there fast enough; Patient has a walker and rollator; She uses both; She also has a manual wheelchair that she uses intermittently;     Limitations  Standing;Walking    How long can you sit comfortably?  NA    How long can you stand comfortably?  has pain in LLE with prolonged stooping; <10 min;     How long can you walk comfortably?  200 feet with assistance requiring several breaks;     Diagnostic tests  has had MRI and brain imaging;     Patient Stated Goals  "Be able to travel more, little sight seeing; walk short distances" Patient reports  that she would like to be able to get rid of the walker.     Currently in Pain?  No/denies    Pain Onset  More than a month ago    Multiple Pain Sites  No        TREATMENT: Warm up on crosstrainer, level 1 x49mn (unbilled); seat #6  Instructed patient in LE strengthening: Standing on airex: -hip abduction 2# x20 bilaterally with min VCs to avoid trunk lean for better strengthening;  -Gluteal max hip extension, donkey kicks, 2# x10 bilaterally with mod VCs for positioning; requires rail assist for safety and has increased difficulty obtaining proper positioning due to LE weakness;   Patient instructed in advanced balance exercise  Standing in parallel bars:  Standing on  airex foam: -alternate toe taps to 4 inch step with 2-1 rail assist, 2# ankle weight, x15 reps bilaterally; -Standing one foot on airex, one foot on 4 inch step, BUE 3# rail lift x10 reps each foot on step  Stepping over orange hurdle with 2# ankle weight Forward/backward with 2-1 rail assist x10 reps  Side step with 2-1 rail assist x10 reps each direction Required mod VCs to increase hip flexion and increase step length for better foot clearance  Weaving around cones #6, x2 laps with min A for safety; required min VCs to increase step length and increase turn for better cone negotiation; patient able to turn around cones when weaving, but when turning to the left, exhibits a short shuffled step pattern with poor foot clearance on LLE;  Instructed patient in 360 turning against wall x2 reps each direction with wall assist for safety; initially required mod VCs to increase step length and improve pivot on foot for better turning ability; Patient hesitant to complete turns and exhibits wide base of support with short shuffled step length due to fear of falling and imbalance;  Instructed patient in turning against counter for better HHA x2 reps; Patient required cues for foot placement and to increase step length for better foot clearance. She was able to progress to improved LLE step length but has difficulty pivoting on RLE and often has poor sequencing and incoordination;  Response to treatment: Patient tolerated well.She does require increased cues for sequencing, coordination; She also requires increased time for turning due to fear of falling. Patient reports mild fatigue at end of session. She denies any increase in pain;                     PT Education - 08/02/18 1038    Education Details  LE strengthening, balance, HEP reinforced;     Person(s) Educated  Patient    Methods  Explanation;Verbal cues    Comprehension  Verbalized understanding;Returned demonstration;Verbal  cues required;Need further instruction       PT Short Term Goals - 07/20/18 1050      PT SHORT TERM GOAL #1   Title  Patient will be adherent to HEP at least 3x a week to improve functional strength and balance for better safety at home.    Time  4    Period  Weeks    Status  Partially Met    Target Date  08/17/18      PT SHORT TERM GOAL #2   Title   Patient will deny any falls over past 4 weeks to demonstrate improved safety awareness at home and work.     Time  4    Period  Weeks  Status  Not Met    Target Date  08/17/18        PT Long Term Goals - 07/20/18 1051      PT LONG TERM GOAL #1   Title  Patient will increase Berg Balance score by > 6 points to demonstrate decreased fall risk during functional activities.    Time  8    Period  Weeks    Status  New    Target Date  09/14/18      PT LONG TERM GOAL #2   Title  Patient will increase 10 meter walk test to >0.8 m/s as to improve gait speed for better community ambulation and to reduce fall risk.    Time  8    Period  Weeks    Status  New    Target Date  09/14/18      PT LONG TERM GOAL #3   Title  Patient will be require no assist with ascend/descend 3 steps using Least restrictive assistive device to improve safety with entry/exit home.     Time  8    Period  Weeks    Status  New    Target Date  09/14/18      PT LONG TERM GOAL #4   Title  Patient will increase BLE gross strength to 4+/5 as to improve functional strength for independent gait, increased standing tolerance and increased ADL ability.    Time  8    Period  Weeks    Status  New    Target Date  09/14/18            Plan - 08/02/18 1257    Clinical Impression Statement  Patient tolerated session well and was motivated throughout session. Instructed patient in advanced LE strengthening with increased repetition/exercise. Progressed balance exercise instructing patient in turning to challenge dynamic balance. Patient does require cues for  sequencing and to increase step length; Patient had significant difficulty with turning. She would benefit from additional skilled PT Intervention to improve strength, balance and gait safety;     Personal Factors and Comorbidities  Age;Time since onset of injury/illness/exacerbation;Comorbidity 3+    Examination-Activity Limitations  Bend;Caring for Others;Carry;Continence;Dressing;Lift;Locomotion Level;Stairs;Stand;Transfers    Examination-Participation Restrictions  Cleaning;Community Activity;Driving;Laundry;Meal Prep;Shop;Yard Work    Stability/Clinical Decision Making  Evolving/Moderate complexity    Rehab Potential  Good    PT Frequency  2x / week    PT Duration  8 weeks    PT Treatment/Interventions  Cryotherapy;Electrical Stimulation;Moist Heat;Gait training;Stair training;Functional mobility training;Therapeutic activities;Therapeutic exercise;Balance training;Neuromuscular re-education;Patient/family education;Energy conservation    PT Next Visit Plan  Continue to work on balance activities and strengthening at next visit       Patient will benefit from skilled therapeutic intervention in order to improve the following deficits and impairments:  Abnormal gait, Decreased balance, Decreased endurance, Decreased mobility, Difficulty walking, Impaired perceived functional ability, Decreased activity tolerance, Decreased safety awareness  Visit Diagnosis: Unsteadiness on feet  Difficulty in walking, not elsewhere classified  Muscle weakness (generalized)     Problem List Patient Active Problem List   Diagnosis Date Noted  . NPH (normal pressure hydrocephalus) ([place]) 07/30/2016  . Left leg weakness 04/20/2016  . Menopausal osteoporosis 03/04/2016  . Abnormal gait 12/18/2015  . Left foot drop 12/18/2015  . BP (high blood pressure) 08/24/2015  . Hydronephrosis, left 07/28/2015  . UPJ (ureteropelvic junction) obstruction 07/28/2015  . UTI (lower urinary tract infection)  07/14/2015  . Hydronephrosis 07/14/2015    Trotter,Margaret PT,  DPT 08/02/2018, 1:23 PM  [place] MAIN [facility] SERVICES [address] [place], [place], [address] Phone: [phone]   Fax:  [phone]  Name: TEHILA SOKOLOW MRN: [id number] Date of Birth: [birth year]

## 2018-08-04 ENCOUNTER — Ambulatory Visit: Payer: PPO | Admitting: Physical Therapy

## 2018-08-05 ENCOUNTER — Encounter: Payer: Self-pay | Admitting: Physical Therapy

## 2018-08-05 ENCOUNTER — Other Ambulatory Visit: Payer: Self-pay

## 2018-08-05 ENCOUNTER — Ambulatory Visit: Payer: PPO | Admitting: Physical Therapy

## 2018-08-05 DIAGNOSIS — R2681 Unsteadiness on feet: Secondary | ICD-10-CM | POA: Diagnosis not present

## 2018-08-05 DIAGNOSIS — R262 Difficulty in walking, not elsewhere classified: Secondary | ICD-10-CM

## 2018-08-05 DIAGNOSIS — M6281 Muscle weakness (generalized): Secondary | ICD-10-CM

## 2018-08-05 NOTE — Therapy (Signed)
[place] MAIN [facility] SERVICES [address] [place], [place], [address] Phone: [phone]   Fax:  [phone]  Physical Therapy Treatment  Patient Details  Name: Abigail Hill MRN: [id number] Date of Birth: [birth year] Referring Provider (PT): Dr. Metta Clines   Encounter Date: 08/05/2018  PT End of Session - 08/05/18 1140    Visit Number  8    Number of Visits  22    Date for PT Re-Evaluation  09/14/18    Authorization Type  goals addressed 07/20/18    PT Start Time  1132    PT Stop Time  1215    PT Time Calculation (min)  43 min    Equipment Utilized During Treatment  Gait belt    Activity Tolerance  Patient tolerated treatment well    Behavior During Therapy  [facility] for tasks assessed/performed       Past Medical History:  Diagnosis Date  . Abnormal gait 12/18/2015  . BP (high blood pressure) 08/24/2015  . Cancer ([place])    skin  . Constriction of ureter (postoperative) 07/28/2015  . Hydronephrosis 07/14/2015  . Hypertension   . Left foot drop 12/18/2015  . Recurrent UTI   . UPJ (ureteropelvic junction) obstruction 07/28/2015  . UTI (lower urinary tract infection) 07/14/2015    Past Surgical History:  Procedure Laterality Date  . broken foot  2012  . COLONOSCOPY WITH PROPOFOL N/A 06/30/2017   Procedure: COLONOSCOPY WITH PROPOFOL;  Surgeon: Lollie Sails, MD;  Location: [facility] ENDOSCOPY;  Service: Endoscopy;  Laterality: N/A;  . kidney repair  1985   repaired torn blood vessel in kidney    There were no vitals filed for this visit.  Subjective Assessment - 08/05/18 1138    Subjective  Patient reports doing well; She reports slight soreness in low back this morning. Denies any new falls;     Pertinent History  68 yo Female reports instability with gait with difficulty walking. She states, "my left leg gives me trouble. It will  just plant and then I can't pick it up." Pt reports 2 falls in last month. She reports when she falls it is  often falling backwards; She was diagnosed with hydrocephalus and did a drain trial with no relief; She did not qualify for a shunt. Repeat imaging showed continued hydrocephalus that has not worsened; MRI of lumbar spine shows L5 pars defect with sacral insufficiency; She denies any back pain and reports no numbness in legs. She reports feeling frustration because they can't seem to find what's wrong with her. She does have urinary incontience; reports knowing when she has to go to the bathroom but can't get there fast enough; Patient has a walker and rollator; She uses both; She also has a manual wheelchair that she uses intermittently;     Limitations  Standing;Walking    How long can you sit comfortably?  NA    How long can you stand comfortably?  has pain in LLE with prolonged stooping; <10 min;     How long can you walk comfortably?  200 feet with assistance requiring several breaks;     Diagnostic tests  has had MRI and brain imaging;     Patient Stated Goals  "Be able to travel more, little sight seeing; walk short distances" Patient reports that she would like to be able to get rid of the walker.     Currently in Pain?  Yes    Pain Score  5  Pain Location  Back    Pain Orientation  Lower    Pain Descriptors / Indicators  Aching;Sore    Pain Type  Chronic pain    Pain Onset  More than a month ago    Pain Frequency  Intermittent    Aggravating Factors   prolonged standing/walking    Pain Relieving Factors  rest/stretches, moist heat    Effect of Pain on Daily Activities  no change in activity level;     Multiple Pain Sites  No       TREATMENT: Warm up on Nustep BUE/BLE level 2 x4 min (Unbilled) concurrent with moist heat to low back;  Instructed patient in LE strengthening: 2# ankle weight: -forward marches x10 feet x2 laps, followed by backwards walking, requiring 2 HHA on railing for balance with min VCs to increase ROM for better hip strengthening; -Side stepping x10 feet x2  laps each direction with rail assist for balance with min VCs to improve hip adduction ROM for better hip strengthening and flexibility; patient often exhibits wide base of support, having difficulty achieving narrow base of support;              Patient instructed in advanced balance exercise  Standing in parallel bars: 2# ankle weight for LE strengthening:  Instructed patient in gait crossovers x10 feet x2 laps each direction with rail assist for balance and mod VCs for sequencing and foot placement; patient had significant difficulty with coordination and sequencing with new exercise;  Weaving around cones #6, x3 laps with close supervision for safety; required min VCs to increase step length and increase turn for better cone negotiation; patient able to turn around cones when weaving, but when turning to the left, exhibits a short shuffled step pattern with poor foot clearance on LLE;  Instructed patient in 360 turning against wall x8 reps each direction with wall assist for safety; initially required mod VCs to increase step length and improve pivot on foot for better turning ability; Patient hesitant to complete turns and exhibits wide base of support with short shuffled step length due to fear of falling and imbalance; Requires increased time for sequencing and coordination with difficult task. Able to exhibit improved step length and pivot with increased repetition;   Response to treatment: Patient tolerated well.She does require increased cues for sequencing, coordination; She also requires increased time for turning due to fear of falling. Patient reports mild fatigue at end of session. She denies any increase in pain;                          PT Education - 08/05/18 1140    Education Details  LE strengthening, balance, HEP reinforced;     Person(s) Educated  Patient    Methods  Explanation;Verbal cues    Comprehension  Verbalized understanding;Returned  demonstration;Verbal cues required;Need further instruction       PT Short Term Goals - 07/20/18 1050      PT SHORT TERM GOAL #1   Title  Patient will be adherent to HEP at least 3x a week to improve functional strength and balance for better safety at home.    Time  4    Period  Weeks    Status  Partially Met    Target Date  08/17/18      PT SHORT TERM GOAL #2   Title   Patient will deny any falls over past 4 weeks to demonstrate improved safety  awareness at home and work.     Time  4    Period  Weeks    Status  Not Met    Target Date  08/17/18        PT Long Term Goals - 07/20/18 1051      PT LONG TERM GOAL #1   Title  Patient will increase Berg Balance score by > 6 points to demonstrate decreased fall risk during functional activities.    Time  8    Period  Weeks    Status  New    Target Date  09/14/18      PT LONG TERM GOAL #2   Title  Patient will increase 10 meter walk test to >0.8 m/s as to improve gait speed for better community ambulation and to reduce fall risk.    Time  8    Period  Weeks    Status  New    Target Date  09/14/18      PT LONG TERM GOAL #3   Title  Patient will be require no assist with ascend/descend 3 steps using Least restrictive assistive device to improve safety with entry/exit home.     Time  8    Period  Weeks    Status  New    Target Date  09/14/18      PT LONG TERM GOAL #4   Title  Patient will increase BLE gross strength to 4+/5 as to improve functional strength for independent gait, increased standing tolerance and increased ADL ability.    Time  8    Period  Weeks    Status  New    Target Date  09/14/18            Plan - 08/05/18 1313    Clinical Impression Statement  Patient tolerated session well and was motivated; She was able to exhibit improved step length and pivot during turns although is inconsistent. Patient often gets confused and slows down with turns over fear of falling. Instructed patient in various  stepping exercise in parallel bars utilizing ankle weight for strengthening and various stepping techniques to facilitate coordination and dynamic balance. She exhibits decreased ROM and shorter step length with LLE with increased challenge and with fatigue. Patient would benefit from additional skilled PT Intervention to improve strength, balance and mobility;     Personal Factors and Comorbidities  Age;Time since onset of injury/illness/exacerbation;Comorbidity 3+    Examination-Activity Limitations  Bend;Caring for Others;Carry;Continence;Dressing;Lift;Locomotion Level;Stairs;Stand;Transfers    Examination-Participation Restrictions  Cleaning;Community Activity;Driving;Laundry;Meal Prep;Shop;Yard Work    Stability/Clinical Decision Making  Evolving/Moderate complexity    Rehab Potential  Good    PT Frequency  2x / week    PT Duration  8 weeks    PT Treatment/Interventions  Cryotherapy;Electrical Stimulation;Moist Heat;Gait training;Stair training;Functional mobility training;Therapeutic activities;Therapeutic exercise;Balance training;Neuromuscular re-education;Patient/family education;Energy conservation    PT Next Visit Plan  Continue to work on balance activities and strengthening at next visit       Patient will benefit from skilled therapeutic intervention in order to improve the following deficits and impairments:  Abnormal gait, Decreased balance, Decreased endurance, Decreased mobility, Difficulty walking, Impaired perceived functional ability, Decreased activity tolerance, Decreased safety awareness  Visit Diagnosis: Unsteadiness on feet  Difficulty in walking, not elsewhere classified  Muscle weakness (generalized)     Problem List Patient Active Problem List   Diagnosis Date Noted  . NPH (normal pressure hydrocephalus) ([place]) 07/30/2016  . Left leg weakness 04/20/2016  . Menopausal osteoporosis  03/04/2016  . Abnormal gait 12/18/2015  . Left foot drop 12/18/2015  . BP  (high blood pressure) 08/24/2015  . Hydronephrosis, left 07/28/2015  . UPJ (ureteropelvic junction) obstruction 07/28/2015  . UTI (lower urinary tract infection) 07/14/2015  . Hydronephrosis 07/14/2015    Abigail Hill PT, DPT 08/05/2018, 1:15 PM  [place] MAIN [facility] SERVICES [address] [place], [place], [address] Phone: [phone]   Fax:  [phone]  Name: Abigail Hill MRN: [id number] Date of Birth: [birth year]

## 2018-08-09 ENCOUNTER — Other Ambulatory Visit: Payer: Self-pay

## 2018-08-09 ENCOUNTER — Ambulatory Visit: Payer: PPO | Admitting: Physical Therapy

## 2018-08-09 ENCOUNTER — Encounter: Payer: Self-pay | Admitting: Physical Therapy

## 2018-08-09 DIAGNOSIS — R2681 Unsteadiness on feet: Secondary | ICD-10-CM | POA: Diagnosis not present

## 2018-08-09 DIAGNOSIS — M6281 Muscle weakness (generalized): Secondary | ICD-10-CM

## 2018-08-09 DIAGNOSIS — R262 Difficulty in walking, not elsewhere classified: Secondary | ICD-10-CM

## 2018-08-09 NOTE — Therapy (Signed)
[place] MAIN [facility] SERVICES [address] [place], [place], [address] Phone: [phone]   Fax:  [phone]  Physical Therapy Treatment  Patient Details  Name: Abigail Hill MRN: [id number] Date of Birth: [birth year] Referring Provider (PT): Dr. Metta Clines   Encounter Date: 08/09/2018  PT End of Session - 08/09/18 1145    Visit Number  9    Number of Visits  22    Date for PT Re-Evaluation  09/14/18    Authorization Type  goals addressed 07/20/18    PT Start Time  1132    PT Stop Time  1215    PT Time Calculation (min)  43 min    Equipment Utilized During Treatment  Gait belt    Activity Tolerance  Patient tolerated treatment well    Behavior During Therapy  [facility] for tasks assessed/performed       Past Medical History:  Diagnosis Date  . Abnormal gait 12/18/2015  . BP (high blood pressure) 08/24/2015  . Cancer ([place])    skin  . Constriction of ureter (postoperative) 07/28/2015  . Hydronephrosis 07/14/2015  . Hypertension   . Left foot drop 12/18/2015  . Recurrent UTI   . UPJ (ureteropelvic junction) obstruction 07/28/2015  . UTI (lower urinary tract infection) 07/14/2015    Past Surgical History:  Procedure Laterality Date  . broken foot  2012  . COLONOSCOPY WITH PROPOFOL N/A 06/30/2017   Procedure: COLONOSCOPY WITH PROPOFOL;  Surgeon: Lollie Sails, MD;  [facility]: [facility] ENDOSCOPY;  Service: Endoscopy;  Laterality: N/A;  . kidney repair  1985   repaired torn blood vessel in kidney    There were no vitals filed for this visit.  Subjective Assessment - 08/09/18 1143    Subjective  Patient reports doing well; She reports going to a ballgame for her grandson and having trouble walking after sitting in wheelchair for long time. She Reports that she had to sit in the wheelchair because the ballgame was on the back field. Pt reports she then went to her daughter's house for dinner and does have difficulty getting into the house due to  stairs; She denies any new falls;     Pertinent History  68 yo Female reports instability with gait with difficulty walking. She states, "my left leg gives me trouble. It will  just plant and then I can't pick it up." Pt reports 2 falls in last month. She reports when she falls it is often falling backwards; She was diagnosed with hydrocephalus and did a drain trial with no relief; She did not qualify for a shunt. Repeat imaging showed continued hydrocephalus that has not worsened; MRI of lumbar spine shows L5 pars defect with sacral insufficiency; She denies any back pain and reports no numbness in legs. She reports feeling frustration because they can't seem to find what's wrong with her. She does have urinary incontience; reports knowing when she has to go to the bathroom but can't get there fast enough; Patient has a walker and rollator; She uses both; She also has a manual wheelchair that she uses intermittently;     Limitations  Standing;Walking    How long can you sit comfortably?  NA    How long can you stand comfortably?  has pain in LLE with prolonged stooping; <10 min;     How long can you walk comfortably?  200 feet with assistance requiring several breaks;     Diagnostic tests  has had MRI and brain  imaging;     Patient Stated Goals  "Be able to travel more, little sight seeing; walk short distances" Patient reports that she would like to be able to get rid of the walker.     Currently in Pain?  Yes    Pain Score  5     Pain Location  Back    Pain Orientation  Lower    Pain Descriptors / Indicators  Aching;Sore    Pain Type  Chronic pain    Pain Onset  More than a month ago    Aggravating Factors   prolonged standing/walking    Pain Relieving Factors  rest/stretches, moist heat    Effect of Pain on Daily Activities  no change in activity level;     Multiple Pain Sites  No              TREATMENT: Warm up on Nustep BUE/BLE level 2 x4 min (Unbilled) concurrent with moist heat  to low back;  PT assessed posture in standing, identified right hip slightly higher than left hip;  Patient reports pain is located at left SI joint;  Instructed patient in long sit test, LLE goes long to short indicating possible anterior rotation of left innominate; Instructed patient in hooklying isometric hip extension 5 sec hold x5 reps x2 sets to facilitate increased posterior rotation with posterior hip activation;  Patient sidelying: PT performed grade II-III posterior rotation mobilization of left innominate 10 sec bouts x3 sets to facilitate better mobility and ROM;   PT did repeat long sit test with neutral LE positioning;   Instructed patient in gait x80 feet with improved step length and slightly narrower base of support although base of support is still wide. After walking approximately 30 feet she starts dragging left foot with short shuffled step;  PT identified right hip higher than left hip suggesting possible uneven leg length; PT applied small heel lift in left should with resolution of hip height difference.  Instructed patient in safe heel lift with instruction to wear for 1-2 hours at a time. Patient ambulated with heel lift with initially improved step length and better foot clearance for at least 50 feet and then would exhibit slight increase in LLE shuffling.   Response to treatment: Patient tolerated well.She does require increased cues for sequencing, coordination; She reports that she feels like she is walking better following heel insert. Patient educated in safety of use of heel lift as a trial to see if that will improve gait safety; Patient reports no pain at end of session; She verbalized understanding of HEP;                   PT Education - 08/09/18 1145    Education Details  LE strengthening, balance, HEP reinforced;     Person(s) Educated  Patient    Methods  Explanation;Verbal cues    Comprehension  Verbalized understanding;Need further  instruction;Returned demonstration;Verbal cues required       PT Short Term Goals - 07/20/18 1050      PT SHORT TERM GOAL #1   Title  Patient will be adherent to HEP at least 3x a week to improve functional strength and balance for better safety at home.    Time  4    Period  Weeks    Status  Partially Met    Target Date  08/17/18      PT SHORT TERM GOAL #2   Title   Patient will deny any  falls over past 4 weeks to demonstrate improved safety awareness at home and work.     Time  4    Period  Weeks    Status  Not Met    Target Date  08/17/18        PT Long Term Goals - 07/20/18 1051      PT LONG TERM GOAL #1   Title  Patient will increase Berg Balance score by > 6 points to demonstrate decreased fall risk during functional activities.    Time  8    Period  Weeks    Status  New    Target Date  09/14/18      PT LONG TERM GOAL #2   Title  Patient will increase 10 meter walk test to >0.8 m/s as to improve gait speed for better community ambulation and to reduce fall risk.    Time  8    Period  Weeks    Status  New    Target Date  09/14/18      PT LONG TERM GOAL #3   Title  Patient will be require no assist with ascend/descend 3 steps using Least restrictive assistive device to improve safety with entry/exit home.     Time  8    Period  Weeks    Status  New    Target Date  09/14/18      PT LONG TERM GOAL #4   Title  Patient will increase BLE gross strength to 4+/5 as to improve functional strength for independent gait, increased standing tolerance and increased ADL ability.    Time  8    Period  Weeks    Status  New    Target Date  09/14/18            Plan - 08/09/18 1327    Clinical Impression Statement  Patient toleratd session well. She was motivated throughout session. Patient reports increased left SI joint discomfort. PT identified possible innominate rotation which could be contributing to discomfort. PT performed stretches and joint mobilization to  improve pelvic mobility; PT also identified uneven hip height. Provided shoe insert to improve hip positioning. Patient reports resolution of pain and was able to exhibit improved gait mechanics following exercise. educated pateint on safety with shoe insert wear. Patient would benefit from additional skilled PT Intervention to improve strength, balance and mobility;     Personal Factors and Comorbidities  Age;Time since onset of injury/illness/exacerbation;Comorbidity 3+    Examination-Activity Limitations  Bend;Caring for Others;Carry;Continence;Dressing;Lift;Locomotion Level;Stairs;Stand;Transfers    Examination-Participation Restrictions  Cleaning;Community Activity;Driving;Laundry;Meal Prep;Shop;Yard Work    Stability/Clinical Decision Making  Evolving/Moderate complexity    Rehab Potential  Good    PT Frequency  2x / week    PT Duration  8 weeks    PT Treatment/Interventions  Cryotherapy;Electrical Stimulation;Moist Heat;Gait training;Stair training;Functional mobility training;Therapeutic activities;Therapeutic exercise;Balance training;Neuromuscular re-education;Patient/family education;Energy conservation    PT Next Visit Plan  Continue to work on balance activities and strengthening at next visit       Patient will benefit from skilled therapeutic intervention in order to improve the following deficits and impairments:  Abnormal gait, Decreased balance, Decreased endurance, Decreased mobility, Difficulty walking, Impaired perceived functional ability, Decreased activity tolerance, Decreased safety awareness  Visit Diagnosis: Unsteadiness on feet  Difficulty in walking, not elsewhere classified  Muscle weakness (generalized)     Problem List Patient Active Problem List   Diagnosis Date Noted  . NPH (normal pressure hydrocephalus) ([place]) 07/30/2016  . Left  leg weakness 04/20/2016  . Menopausal osteoporosis 03/04/2016  . Abnormal gait 12/18/2015  . Left foot drop 12/18/2015  .  BP (high blood pressure) 08/24/2015  . Hydronephrosis, left 07/28/2015  . UPJ (ureteropelvic junction) obstruction 07/28/2015  . UTI (lower urinary tract infection) 07/14/2015  . Hydronephrosis 07/14/2015    Osborne Serio PT, DPT 08/09/2018, 1:30 PM  [place] MAIN [facility] SERVICES [address] [place], [place], [address] Phone: [phone]   Fax:  [phone]  Name: LUMEN BRINLEE MRN: [id number] Date of Birth: 10/27/1950

## 2018-08-11 ENCOUNTER — Ambulatory Visit: Payer: PPO | Admitting: Physical Therapy

## 2018-08-12 ENCOUNTER — Encounter: Payer: Self-pay | Admitting: Physical Therapy

## 2018-08-12 ENCOUNTER — Other Ambulatory Visit: Payer: Self-pay

## 2018-08-12 ENCOUNTER — Ambulatory Visit: Payer: PPO | Admitting: Physical Therapy

## 2018-08-12 DIAGNOSIS — R262 Difficulty in walking, not elsewhere classified: Secondary | ICD-10-CM

## 2018-08-12 DIAGNOSIS — M6281 Muscle weakness (generalized): Secondary | ICD-10-CM

## 2018-08-12 DIAGNOSIS — R2681 Unsteadiness on feet: Secondary | ICD-10-CM | POA: Diagnosis not present

## 2018-08-12 NOTE — Therapy (Signed)
[place] [address] [place], [place], [address] Phone: [phone]   Fax:  [phone]  Physical Therapy Treatment Physical Therapy Progress Note   Dates of reporting period  07/20/18   to   08/12/18   Patient Details  Name: Abigail Hill MRN: [id number] Date of Birth: 01/20/1951 Referring Provider (PT): Dr. Metta Clines   Encounter Date: 08/12/2018  PT End of Session - 08/12/18 1133    Visit Number  10    Number of Visits  22    Date for PT Re-Evaluation  09/14/18    Authorization Type  goals addressed 07/20/18    PT Start Time  1131    PT Stop Time  1215    PT Time Calculation (min)  44 min    Equipment Utilized During Treatment  Gait belt    Activity Tolerance  Patient tolerated treatment well    Behavior During Therapy  [facility] for tasks assessed/performed       Past Medical History:  Diagnosis Date  . Abnormal gait 12/18/2015  . BP (high blood pressure) 08/24/2015  . Cancer ([place])    skin  . Constriction of ureter (postoperative) 07/28/2015  . Hydronephrosis 07/14/2015  . Hypertension   . Left foot drop 12/18/2015  . Recurrent UTI   . UPJ (ureteropelvic junction) obstruction 07/28/2015  . UTI (lower urinary tract infection) 07/14/2015    Past Surgical History:  Procedure Laterality Date  . broken foot  2012  . COLONOSCOPY WITH PROPOFOL N/A 06/30/2017   Procedure: COLONOSCOPY WITH PROPOFOL;  Surgeon: Lollie Sails, MD;  [facility] END[facility];  Service: Endoscopy;  Laterality: N/A;  . kidney repair  1985   repaired torn blood vessel in kidney    There were no vitals filed for this visit.  Subjective Assessment - 08/12/18 1131    Subjective  Patient reports doing well today; She denies any back pain currently and states that she hasn't had any pain over last 2 days. She reports that she feels that the heel lift is helping with her walking some.    Pertinent History  68 yo Female reports instability  with gait with difficulty walking. She states, "my left leg gives me trouble. It will  just plant and then I can't pick it up." Pt reports 2 falls in last month. She reports when she falls it is often falling backwards; She was diagnosed with hydrocephalus and did a drain trial with no relief; She did not qualify for a shunt. Repeat imaging showed continued hydrocephalus that has not worsened; MRI of lumbar spine shows L5 pars defect with sacral insufficiency; She denies any back pain and reports no numbness in legs. She reports feeling frustration because they can't seem to find what's wrong with her. She does have urinary incontience; reports knowing when she has to go to the bathroom but can't get there fast enough; Patient has a walker and rollator; She uses both; She also has a manual wheelchair that she uses intermittently;     Limitations  Standing;Walking    How long can you sit comfortably?  NA    How long can you stand comfortably?  has pain in LLE with prolonged stooping; <10 min;     How long can you walk comfortably?  200 feet with assistance requiring several breaks;     Diagnostic tests  has had MRI and brain imaging;     Patient Stated Goals  "Be able to travel  more, little sight seeing; walk short distances" Patient reports that she would like to be able to get rid of the walker.     Currently in Pain?  No/denies    Pain Onset  More than a month ago    Multiple Pain Sites  No         [facility] PT Assessment - 08/12/18 0001      Standardized Balance Assessment   10 Meter Walk  0.45 m/s with RW, 0.5 m/s without AD, limited home ambulator; able to achieve 0.68 m/s without AD      Berg Balance Test   Sit to Stand  Able to stand without using hands and stabilize independently    Standing Unsupported  Able to stand safely 2 minutes    Sitting with Back Unsupported but Feet Supported on Floor or Stool  Able to sit safely and securely 2 minutes    Stand to Sit  Sits safely with minimal  use of hands    Transfers  Able to transfer safely, minor use of hands    Standing Unsupported with Eyes Closed  Able to stand 10 seconds safely    Standing Unsupported with Feet Together  Able to place feet together independently and stand for 1 minute with supervision    From Standing, Reach Forward with Outstretched Arm  Can reach confidently >25 cm (10")    From Standing Position, Pick up Object from Floor  Able to pick up shoe, needs supervision    From Standing Position, Turn to Look Behind Over each Shoulder  Looks behind from both sides and weight shifts well    Turn 360 Degrees  Able to turn 360 degrees safely but slowly    Standing Unsupported, Alternately Place Feet on Step/Stool  Able to complete 4 steps without aid or supervision    Standing Unsupported, One Foot in Front  Able to take small step independently and hold 30 seconds    Standing on One Leg  Tries to lift leg/unable to hold 3 seconds but remains standing independently    Total Score  45    Berg comment:  80% risk for falls, improved from 39/56 on 07/20/18         TREATMENT: Warm up on Nustep BUE/BLE level 2 x4 min (Unbilled);  Instructed patient in outcome measures to address goals, see above: Patient exhibits significant improvement in balance and gait speed;      Instructed patient in LE strengthening: Leg press: BLE plate 90# 5B26 BLE heel raises 90# 2x12 Patient required min-moderate verbal/tactile cues for correct exercise technique including cues for sequencing and positioning for better strengthening;   2# ankle weight: -forward marches x15 bilaterally  -hip abduction x15 bilaterally; -hamstring curl x15 bilaterally; Patient required min VCs to increase ROM to tolerance and avoid trunk lean for better hip strengthening;  NMR:  Stepping over hurdles #2, reciprocally x10 laps with 2-0 rail assist; patient able to progress to no rail assist; Patient requires close supervision with min VCs to increase  step length and improve speed for better reciprocal stepping; Pt hesitant to reduce rail assist due to fear of falling;     Response to treatment: Patient exhibits improvement in balance and gait ability as evidenced by outcome measures. Patient's condition has the potential to improve in response to therapy. Maximum improvement is yet to be obtained. The anticipated improvement is attainable and reasonable in a generally predictable time.  Patient reports feeling better with heel lift but still  is concerned about her wide base of support and uneven step length; She responded well to cues for better hip ROM and strengthening. She reports some fatigue at end of session but denies any pain;               PT Education - 08/12/18 1132    Education Details  Progress towards goals, strengthening, balance; HEP reinforced;    Person(s) Educated  Patient    Methods  Explanation;Verbal cues    Comprehension  Verbalized understanding;Returned demonstration;Verbal cues required;Need further instruction       PT Short Term Goals - 08/12/18 1134      PT SHORT TERM GOAL #1   Title  Patient will be adherent to HEP at least 3x a week to improve functional strength and balance for better safety at home.    Baseline  08/12/18- doing exercise daily;    Time  4    Period  Weeks    Status  Achieved    Target Date  08/17/18      PT SHORT TERM GOAL #2   Title   Patient will deny any falls over past 4 weeks to demonstrate improved safety awareness at home and work.     Baseline  08/12/18- fell 1 week ago;    Time  4    Period  Weeks    Status  Not Met    Target Date  08/17/18        PT Long Term Goals - 08/12/18 1134      PT LONG TERM GOAL #1   Title  Patient will increase Berg Balance score by > 6 points to demonstrate decreased fall risk during functional activities.    Time  8    Period  Weeks    Status  Achieved    Target Date  09/14/18      PT LONG TERM GOAL #2   Title  Patient  will increase 10 meter walk test to >0.8 m/s as to improve gait speed for better community ambulation and to reduce fall risk.    Time  8    Period  Weeks    Status  Partially Met    Target Date  09/14/18      PT LONG TERM GOAL #3   Title  Patient will be require no assist with ascend/descend 3 steps using Least restrictive assistive device to improve safety with entry/exit home.     Time  8    Period  Weeks    Status  Partially Met    Target Date  09/14/18      PT LONG TERM GOAL #4   Title  Patient will increase BLE gross strength to 4+/5 as to improve functional strength for independent gait, increased standing tolerance and increased ADL ability.    Time  8    Period  Weeks    Status  Partially Met    Target Date  09/14/18            Plan - 08/12/18 1306    Clinical Impression Statement  Patient progressing well. She was able to exhibit significant improvement in balance and gait ability as evidenced with outcome measures. Patient also reports improved gait ability with heel lift in LLE. Patient continues to have weakness in BLE and difficulty with dynamic balance, specifically with turning. She was instructed in advanced strengthening exercise today to improve overall mobility; She would benefit from additional skilled PT Intervention to improve strength, balance and   gait safety; Currently plan remains appropriate.    Personal Factors and Comorbidities  Age;Time since onset of injury/illness/exacerbation;Comorbidity 3+    Examination-Activity Limitations  Bend;Caring for Others;Carry;Continence;Dressing;Lift;Locomotion Level;Stairs;Stand;Transfers    Examination-Participation Restrictions  Cleaning;Community Activity;Driving;Laundry;Meal Prep;Shop;Yard Work    Stability/Clinical Decision Making  Evolving/Moderate complexity    Rehab Potential  Good    PT Frequency  2x / week    PT Duration  8 weeks    PT Treatment/Interventions  Cryotherapy;Electrical Stimulation;Moist  Heat;Gait training;Stair training;Functional mobility training;Therapeutic activities;Therapeutic exercise;Balance training;Neuromuscular re-education;Patient/family education;Energy conservation    PT Next Visit Plan  Continue to work on balance activities and strengthening at next visit       Patient will benefit from skilled therapeutic intervention in order to improve the following deficits and impairments:  Abnormal gait, Decreased balance, Decreased endurance, Decreased mobility, Difficulty walking, Impaired perceived functional ability, Decreased activity tolerance, Decreased safety awareness  Visit Diagnosis: Unsteadiness on feet - Plan:   Difficulty in walking, not elsewhere classified - Plan:   Muscle weakness (generalized) - Plan:      Problem List Patient Active Problem List   Diagnosis Date Noted  . NPH (normal pressure hydrocephalus) ([facility]) 07/30/2016  . Left leg weakness 04/20/2016  . Menopausal osteoporosis 03/04/2016  . Abnormal gait 12/18/2015  . Left foot drop 12/18/2015  . BP (high blood pressure) 08/24/2015  . Hydronephrosis, left 07/28/2015  . UPJ (ureteropelvic junction) obstruction 07/28/2015  . UTI (lower urinary tract infection) 07/14/2015  . Hydronephrosis 07/14/2015    , PT, DPT 08/12/2018, 1:10 PM  [place] [facility] REH[facility] SERVICES [address] [place], [place], [address] Phone: [phone]   Fax:  [phone]  Name: Abigail Hill MRN: [id number] Date of Birth: 03/07/1950   

## 2018-08-16 ENCOUNTER — Encounter: Payer: Self-pay | Admitting: Physical Therapy

## 2018-08-16 ENCOUNTER — Ambulatory Visit: Payer: PPO

## 2018-08-16 ENCOUNTER — Other Ambulatory Visit: Payer: Self-pay

## 2018-08-16 DIAGNOSIS — R262 Difficulty in walking, not elsewhere classified: Secondary | ICD-10-CM

## 2018-08-16 DIAGNOSIS — R2681 Unsteadiness on feet: Secondary | ICD-10-CM

## 2018-08-16 DIAGNOSIS — M6281 Muscle weakness (generalized): Secondary | ICD-10-CM

## 2018-08-16 NOTE — Therapy (Signed)
[place] MAIN [facility] SERVICES [address] [place], [place], [address] Phone: [phone]   Fax:  [phone]  Physical Therapy Treatment  Patient Details  Name: ALAISHA EVERSLEY MRN: [id number] Date of Birth: 03/14/1950 Referring Provider (PT): Dr. Metta Clines   Encounter Date: 08/16/2018  PT End of Session - 08/16/18 1138    Visit Number  11    Number of Visits  22    Date for PT Re-Evaluation  09/14/18    Authorization Type  goals addressed 07/20/18    PT Start Time  1133    PT Stop Time  1217    PT Time Calculation (min)  44 min    Equipment Utilized During Treatment  Gait belt    Activity Tolerance  Patient tolerated treatment well    Behavior During Therapy  [facility] for tasks assessed/performed       Past Medical History:  Diagnosis Date  . Abnormal gait 12/18/2015  . BP (high blood pressure) 08/24/2015  . Cancer ([place])    skin  . Constriction of ureter (postoperative) 07/28/2015  . Hydronephrosis 07/14/2015  . Hypertension   . Left foot drop 12/18/2015  . Recurrent UTI   . UPJ (ureteropelvic junction) obstruction 07/28/2015  . UTI (lower urinary tract infection) 07/14/2015    Past Surgical History:  Procedure Laterality Date  . broken foot  2012  . COLONOSCOPY WITH PROPOFOL N/A 06/30/2017   Procedure: COLONOSCOPY WITH PROPOFOL;  Surgeon: Lollie Sails, MD;  Location: [facility] ENDOSCOPY;  Service: Endoscopy;  Laterality: N/A;  . kidney repair  1985   repaired torn blood vessel in kidney    There were no vitals filed for this visit.  Subjective Assessment - 08/16/18 1136    Subjective  Patient reported that she is doing well today, normally does her exercises but has not this weekend due to family activities. No falls, no recent changes    Pertinent History  68 yo Female reports instability with gait with difficulty walking. She states, "my left leg gives me trouble. It will  just plant and then I can't pick it up." Pt reports 2 falls  in last month. She reports when she falls it is often falling backwards; She was diagnosed with hydrocephalus and did a drain trial with no relief; She did not qualify for a shunt. Repeat imaging showed continued hydrocephalus that has not worsened; MRI of lumbar spine shows L5 pars defect with sacral insufficiency; She denies any back pain and reports no numbness in legs. She reports feeling frustration because they can't seem to find what's wrong with her. She does have urinary incontience; reports knowing when she has to go to the bathroom but can't get there fast enough; Patient has a walker and rollator; She uses both; She also has a manual wheelchair that she uses intermittently;     Limitations  Standing;Walking    How long can you sit comfortably?  NA    How long can you stand comfortably?  has pain in LLE with prolonged stooping; <10 min;     How long can you walk comfortably?  200 feet with assistance requiring several breaks;     Diagnostic tests  has had MRI and brain imaging;     Patient Stated Goals  "Be able to travel more, little sight seeing; walk short distances" Patient reports that she would like to be able to get rid of the walker.     Currently in Pain?  No/denies  TREATMENT: Warm up on Nustep BUE/BLE level 2 x4 min (Unbilled);   Instructed patient in LE strengthening: Leg press: BLE plate 90# [id number] BLE heel raises 90# 2x15 Patient required min-moderate verbal/tactile cues for correct exercise technique including cues for sequencing and positioning for better strengthening;    3# ankle weight: -forward marches x15 bilaterally  -hip abduction x15 bilaterally; -hamstring curl x15 bilaterally; Patient required min VCs to increase ROM to tolerance and avoid trunk lean for better hip strengthening;   NMR:  Stepping over hurdles #3, reciprocally x12 laps with 2-1 rail assist; patient able to progress to no rail assist;  Patient requires close supervision with min VCs to  increase step length and improve speed for better reciprocal stepping; Pt hesitant to reduce rail assist due to fear of falling;   Toe taps to 6" step on airex pad 1 UE support on rails x15 bilaterally Lateral toe taps on airex pad to 6" step with 1 UE on rails,x10 bilaterally One step forward, one foot on one airex pad 3x15sec without UE support, minA for safety One step forward on flat ground, without UE support, 2 sets of 10 bilaterally of horizontal head turns, 1 set of ten of vertical head turns, minA for safety.  Response to treatment: Patient continues to demonstrate excellent motivation for therapy. Most challenged this session by L foot clearance for lateral and forward toe taps, as well as during hurdle stepping. Pt needed verbal/visual cues to maximize safety and exercise form throughout session. The patient would benefit from further skilled PT intervention to continue progress towards goals.    PT Education - 08/16/18 1137    Education Details  exercise technique/form    Person(s) Educated  Patient    Methods  Explanation;Demonstration    Comprehension  Verbalized understanding;Returned demonstration;Verbal cues required       PT Short Term Goals - 08/12/18 1134      PT SHORT TERM GOAL #1   Title  Patient will be adherent to HEP at least 3x a week to improve functional strength and balance for better safety at home.    Baseline  08/12/18- doing exercise daily;    Time  4    Period  Weeks    Status  Achieved    Target Date  08/17/18      PT SHORT TERM GOAL #2   Title   Patient will deny any falls over past 4 weeks to demonstrate improved safety awareness at home and work.     Baseline  08/12/18- fell 1 week ago;    Time  4    Period  Weeks    Status  Not Met    Target Date  08/17/18        PT Long Term Goals - 08/12/18 1134      PT LONG TERM GOAL #1   Title  Patient will increase Berg Balance score by > 6 points to demonstrate decreased fall risk during functional  activities.    Time  8    Period  Weeks    Status  Achieved    Target Date  09/14/18      PT LONG TERM GOAL #2   Title  Patient will increase 10 meter walk test to >0.8 m/s as to improve gait speed for better community ambulation and to reduce fall risk.    Time  8    Period  Weeks    Status  Partially Met    Target Date  09/14/18  PT LONG TERM GOAL #3   Title  Patient will be require no assist with ascend/descend 3 steps using Least restrictive assistive device to improve safety with entry/exit home.     Time  8    Period  Weeks    Status  Partially Met    Target Date  09/14/18      PT LONG TERM GOAL #4   Title  Patient will increase BLE gross strength to 4+/5 as to improve functional strength for independent gait, increased standing tolerance and increased ADL ability.    Time  8    Period  Weeks    Status  Partially Met    Target Date  09/14/18            Plan - 08/16/18 1322    Clinical Impression Statement  Patient continues to demonstrate excellent motivation for therapy. Most challenged this session by L foot clearance for lateral and forward toe taps, as well as during hurdle stepping. Pt needed verbal/visual cues to maximize safety and exercise form throughout session. The patient would benefit from further skilled PT intervention to continue progress towards goals.    Personal Factors and Comorbidities  Age;Time since onset of injury/illness/exacerbation;Comorbidity 3+    Examination-Activity Limitations  Bend;Caring for Others;Carry;Continence;Dressing;Lift;Locomotion Level;Stairs;Stand;Transfers    Examination-Participation Restrictions  Cleaning;Community Activity;Driving;Laundry;Meal Prep;Shop;Yard Work    Rehab Potential  Good    PT Frequency  2x / week    PT Duration  8 weeks    PT Treatment/Interventions  Cryotherapy;Electrical Stimulation;Moist Heat;Gait training;Stair training;Functional mobility training;Therapeutic activities;Therapeutic  exercise;Balance training;Neuromuscular re-education;Patient/family education;Energy conservation    PT Next Visit Plan  Continue to work on balance activities and strengthening at next visit    PT [place]  will address next visit    Consulted and Agree with Plan of Care  Patient       Patient will benefit from skilled therapeutic intervention in order to improve the following deficits and impairments:  Abnormal gait, Decreased balance, Decreased endurance, Decreased mobility, Difficulty walking, Impaired perceived functional ability, Decreased activity tolerance, Decreased safety awareness  Visit Diagnosis: Unsteadiness on feet  Difficulty in walking, not elsewhere classified   Muscle weakness (generalized)      Problem List Patient Active Problem List   Diagnosis Date Noted  . NPH (normal pressure hydrocephalus) ([place]) 07/30/2016  . Left leg weakness 04/20/2016  . Menopausal osteoporosis 03/04/2016  . Abnormal gait 12/18/2015  . Left foot drop 12/18/2015  . BP (high blood pressure) 08/24/2015  . Hydronephrosis, left 07/28/2015  . UPJ (ureteropelvic junction) obstruction 07/28/2015  . UTI (lower urinary tract infection) 07/14/2015  . Hydronephrosis 07/14/2015    Lieutenant Diego PT, DPT 1:24 PM,08/16/18 [place] MAIN [facility] SERVICES [address] [place], [place], [address] Phone: [phone]   Fax:  [phone]  Name: NYAZIA CANEVARI MRN: [id number] Date of Birth: 1951-02-22

## 2018-08-18 ENCOUNTER — Ambulatory Visit: Payer: PPO | Admitting: Physical Therapy

## 2018-08-19 ENCOUNTER — Ambulatory Visit: Payer: PPO | Admitting: Physical Therapy

## 2018-08-23 ENCOUNTER — Ambulatory Visit: Payer: PPO | Admitting: Physical Therapy

## 2018-08-23 ENCOUNTER — Other Ambulatory Visit: Payer: Self-pay

## 2018-08-23 DIAGNOSIS — R2681 Unsteadiness on feet: Secondary | ICD-10-CM | POA: Diagnosis not present

## 2018-08-23 DIAGNOSIS — R262 Difficulty in walking, not elsewhere classified: Secondary | ICD-10-CM

## 2018-08-23 DIAGNOSIS — M6281 Muscle weakness (generalized): Secondary | ICD-10-CM

## 2018-08-23 NOTE — Therapy (Signed)
[place] MAIN [facility] SERVICES [address] [place], [place], [address] Phone: [phone]   Fax:  [phone]  Physical Therapy Treatment  Patient Details  Name: Abigail Hill MRN: [id number] Date of Birth: [birth year] Referring Provider (PT): Dr. Metta Clines   Encounter Date: 08/23/2018  PT End of Session - 08/25/18 1307    Visit Number  12    Number of Visits  22    Date for PT Re-Evaluation  09/14/18    Authorization Type  goals addressed 07/20/18    PT Start Time  1135    PT Stop Time  1220    PT Time Calculation (min)  45 min    Equipment Utilized During Treatment  Gait belt    Activity Tolerance  Patient tolerated treatment well    Behavior During Therapy  [facility] for tasks assessed/performed       Past Medical History:  Diagnosis Date  . Abnormal gait 12/18/2015  . BP (high blood pressure) 08/24/2015  . Cancer ([place])    skin  . Constriction of ureter (postoperative) 07/28/2015  . Hydronephrosis 07/14/2015  . Hypertension   . Left foot drop 12/18/2015  . Recurrent UTI   . UPJ (ureteropelvic junction) obstruction 07/28/2015  . UTI (lower urinary tract infection) 07/14/2015    Past Surgical History:  Procedure Laterality Date  . broken foot  2012  . COLONOSCOPY WITH PROPOFOL N/A 06/30/2017   Procedure: COLONOSCOPY WITH PROPOFOL;  Surgeon: Lollie Sails, MD;  Location: [facility] ENDOSCOPY;  Service: Endoscopy;  Laterality: N/A;  . kidney repair  1985   repaired torn blood vessel in kidney    There were no vitals filed for this visit.  Subjective Assessment - 08/25/18 1305    Subjective  Patient reports doing well; She expressed frustration over missing last appointment because she was supposed to go to see a new doctor at a movement disorder clinic but they cancelled and then she was unable to reschedule her therapy appointment; Overall she feels like she is doing better with resolution of hip pain and feeling like her walking is slowly  getting better.    Pertinent History  68 yo Female reports instability with gait with difficulty walking. She states, "my left leg gives me trouble. It will  just plant and then I can't pick it up." Pt reports 2 falls in last month. She reports when she falls it is often falling backwards; She was diagnosed with hydrocephalus and did a drain trial with no relief; She did not qualify for a shunt. Repeat imaging showed continued hydrocephalus that has not worsened; MRI of lumbar spine shows L5 pars defect with sacral insufficiency; She denies any back pain and reports no numbness in legs. She reports feeling frustration because they can't seem to find what's wrong with her. She does have urinary incontience; reports knowing when she has to go to the bathroom but can't get there fast enough; Patient has a walker and rollator; She uses both; She also has a manual wheelchair that she uses intermittently;     Limitations  Standing;Walking    How long can you sit comfortably?  NA    How long can you stand comfortably?  has pain in LLE with prolonged stooping; <10 min;     How long can you walk comfortably?  200 feet with assistance requiring several breaks;     Diagnostic tests  has had MRI and brain imaging;     Patient Stated Goals  "  Be able to travel more, little sight seeing; walk short distances" Patient reports that she would like to be able to get rid of the walker.     Currently in Pain?  No/denies    Multiple Pain Sites  No            TREATMENT: Warm up on Nustep BUE/BLE level 2 x4 min (Unbilled);  Instructed patient in LE strengthening: Leg press: BLE plate 90# [id number] BLE heel raises 90# 2x15 Patient required min-moderate verbal/tactile cues for correct exercise techniqueincluding cues for sequencing and positioning for better strengthening;  NMR: Stepping over hurdles #3, reciprocally x12 laps with 2-1-0 rail assist; patient able to progress to no rail assist;Patient requires  close supervision with min VCs to increase step length and improve speed for better reciprocal stepping; Pt hesitant to reduce rail assist due to fear of falling;   Standing LLE with RLE hip flexion march unsupported x10 with cues for weight shift to LLE for better stance control;  Gait cross overs x10 feet x2 laps each direction with B rail assist;  Forward lunges with 1 rail assist x5 reps each foot in front with cues for push off front leg  For better weight shift and dynamic balance;   Response to treatment: Patient continues to demonstrate excellent motivation for therapy. Most challenged this session by L foot clearance for lateral and forward toe taps, as well as during hurdle stepping. Pt needed verbal/visual cues to maximize safety and exercise form throughout session. The patient would benefit from further skilled PT intervention to continue progress towards goals.                    PT Education - 08/25/18 1306    Education Details  exercise technique/balance/strengthening;    Person(s) Educated  Patient    Methods  Explanation    Comprehension  Verbalized understanding       PT Short Term Goals - 08/12/18 1134      PT SHORT TERM GOAL #1   Title  Patient will be adherent to HEP at least 3x a week to improve functional strength and balance for better safety at home.    Baseline  08/12/18- doing exercise daily;    Time  4    Period  Weeks    Status  Achieved    Target Date  08/17/18      PT SHORT TERM GOAL #2   Title   Patient will deny any falls over past 4 weeks to demonstrate improved safety awareness at home and work.     Baseline  08/12/18- fell 1 week ago;    Time  4    Period  Weeks    Status  Not Met    Target Date  08/17/18        PT Long Term Goals - 08/12/18 1134      PT LONG TERM GOAL #1   Title  Patient will increase Berg Balance score by > 6 points to demonstrate decreased fall risk during functional activities.    Time  8     Period  Weeks    Status  Achieved    Target Date  09/14/18      PT LONG TERM GOAL #2   Title  Patient will increase 10 meter walk test to >0.8 m/s as to improve gait speed for better community ambulation and to reduce fall risk.    Time  8    Period  Weeks  Status  Partially Met    Target Date  09/14/18      PT LONG TERM GOAL #3   Title  Patient will be require no assist with ascend/descend 3 steps using Least restrictive assistive device to improve safety with entry/exit home.     Time  8    Period  Weeks    Status  Partially Met    Target Date  09/14/18      PT LONG TERM GOAL #4   Title  Patient will increase BLE gross strength to 4+/5 as to improve functional strength for independent gait, increased standing tolerance and increased ADL ability.    Time  8    Period  Weeks    Status  Partially Met    Target Date  09/14/18            Plan - 08/25/18 1307    Clinical Impression Statement  Patient tolerated session well. She was instructed in stepping over hurdles with and without rail assist. She exhibits decreased weight shift initially requiring cues for better weight shift to LLE for improved RLE foot clearance; Instructed patient in standing RLE hip flexion march to facilitate better weight shift; patient able to progress to stepping over hurdles without rail assist with improved weight shift and improved foot clearance; Instructed patient in advanced balance tasks including dynamic balance exercise such as crossovers. Patient does require min VCs for sequencing and proper positioning; Patient would benefit from additional skilled PT intervention to improve strength, balance and gait safety;    Personal Factors and Comorbidities  Age;Time since onset of injury/illness/exacerbation;Comorbidity 3+    Examination-Activity Limitations  Bend;Caring for Others;Carry;Continence;Dressing;Lift;Locomotion Level;Stairs;Stand;Transfers    Examination-Participation Restrictions   Cleaning;Community Activity;Driving;Laundry;Meal Prep;Shop;Yard Work    Rehab Potential  Good    PT Frequency  2x / week    PT Duration  8 weeks    PT Treatment/Interventions  Cryotherapy;Electrical Stimulation;Moist Heat;Gait training;Stair training;Functional mobility training;Therapeutic activities;Therapeutic exercise;Balance training;Neuromuscular re-education;Patient/family education;Energy conservation    PT Next Visit Plan  Continue to work on balance activities and strengthening at next visit    PT [place]  will address next visit    Consulted and Agree with Plan of Care  Patient       Patient will benefit from skilled therapeutic intervention in order to improve the following deficits and impairments:  Abnormal gait, Decreased balance, Decreased endurance, Decreased mobility, Difficulty walking, Impaired perceived functional ability, Decreased activity tolerance, Decreased safety awareness  Visit Diagnosis: 1. Unsteadiness on feet   2. Difficulty in walking, not elsewhere classified   3. Muscle weakness (generalized)        Problem List Patient Active Problem List   Diagnosis Date Noted  . NPH (normal pressure hydrocephalus) ([place]) 07/30/2016  . Left leg weakness 04/20/2016  . Menopausal osteoporosis 03/04/2016  . Abnormal gait 12/18/2015  . Left foot drop 12/18/2015  . BP (high blood pressure) 08/24/2015  . Hydronephrosis, left 07/28/2015  . UPJ (ureteropelvic junction) obstruction 07/28/2015  . UTI (lower urinary tract infection) 07/14/2015  . Hydronephrosis 07/14/2015    Oakley Orban PT, DPT 08/25/2018, 1:12 PM  [place] MAIN [facility] SERVICES [address] [place], [place], [address] Phone: [phone]   Fax:  [phone]  Name: VANEZA PICKART MRN: [id number] Date of Birth: 02-17-1951

## 2018-08-25 ENCOUNTER — Encounter: Payer: Self-pay | Admitting: Physical Therapy

## 2018-08-25 ENCOUNTER — Ambulatory Visit: Payer: PPO | Admitting: Physical Therapy

## 2018-08-26 ENCOUNTER — Ambulatory Visit: Payer: PPO

## 2018-08-26 ENCOUNTER — Other Ambulatory Visit: Payer: Self-pay

## 2018-08-26 DIAGNOSIS — R262 Difficulty in walking, not elsewhere classified: Secondary | ICD-10-CM

## 2018-08-26 DIAGNOSIS — M6281 Muscle weakness (generalized): Secondary | ICD-10-CM

## 2018-08-26 DIAGNOSIS — R2681 Unsteadiness on feet: Secondary | ICD-10-CM

## 2018-08-26 NOTE — Therapy (Signed)
[place] MAIN [facility] SERVICES [address] [place], [place], [address] Phone: [phone]   Fax:  [phone]  Physical Therapy Treatment  Patient Details  Name: Abigail Hill MRN: [id number] Date of Birth: 1950/06/26 Referring Provider (PT): Dr. Metta Clines   Encounter Date: 08/26/2018  PT End of Session - 08/26/18 1224    Visit Number  13    Number of Visits  22    Date for PT Re-Evaluation  09/14/18    Authorization Type  goals addressed 07/20/18    PT Start Time  1132    PT Stop Time  1216    PT Time Calculation (min)  44 min    Activity Tolerance  Patient tolerated treatment well;Patient limited by fatigue    Behavior During Therapy  [facility] for tasks assessed/performed       Past Medical History:  Diagnosis Date  . Abnormal gait 12/18/2015  . BP (high blood pressure) 08/24/2015  . Cancer ([place])    skin  . Constriction of ureter (postoperative) 07/28/2015  . Hydronephrosis 07/14/2015  . Hypertension   . Left foot drop 12/18/2015  . Recurrent UTI   . UPJ (ureteropelvic junction) obstruction 07/28/2015  . UTI (lower urinary tract infection) 07/14/2015    Past Surgical History:  Procedure Laterality Date  . broken foot  2012  . COLONOSCOPY WITH PROPOFOL N/A 06/30/2017   Procedure: COLONOSCOPY WITH PROPOFOL;  Surgeon: Lollie Sails, MD;  [facility] END[facility]COPY;  Service: Endoscopy;  Laterality: N/A;  . kidney repair  1985   repaired torn blood vessel in kidney    There were no vitals filed for this visit.  Subjective Assessment - 08/26/18 1136    Subjective  Pt doing well in general, no pain this date. Exercises at home are going fine. She says that her walking seems to be getting better.    Pertinent History  68 yo Female reports instability with gait with difficulty walking. She states, "my left leg gives me trouble. It will  just plant and then I can't pick it up." Pt reports 2 falls in last month. She reports when she falls it  is often falling backwards; She was diagnosed with hydrocephalus and did a drain trial with no relief; She did not qualify for a shunt. Repeat imaging showed continued hydrocephalus that has not worsened; MRI of lumbar spine shows L5 pars defect with sacral insufficiency; She denies any back pain and reports no numbness in legs. She reports feeling frustration because they can't seem to find what's wrong with her. She does have urinary incontience; reports knowing when she has to go to the bathroom but can't get there fast enough; Patient has a walker and rollator; She uses both; She also has a manual wheelchair that she uses intermittently;     Pain Score  0-No pain       Therapeutic Exercise: Leg press, seat hole 7 -BLE plate 105# [id number] (cued for 15x); 2x15 _0 # -BLE heel raises 105# 1x10 (poor ROM amplitude); 2x15 _1     Neuromuscular Re-education:  -standing tall marching, reciprocal pattern, BUE support on // bars 1x20 -side step over huddle 1x16 alternating sides (BUE support on // bar -lateral side stepping c 5lb ankle weights, VC for short and choppy steps, additional trunk weight shifting. 1x55f bilat -RetroAMB no UE support (MinGUard Assist) 2x279f rest between.    Patient required min-moderate verbal/tactile cues for correct exercise technique including cues for sequencing and positioning for better strengthening;  [place] Adult PT Treatment/Exercise - 08/26/18 0001      Ambulation/Gait   Ambulation Distance (Feet)  100 Feet   4x149f   Assistive device  4-wheeled walker    Gait velocity  0.424m               PT Short Term Goals - 08/12/18 1134      PT SHORT TERM GOAL #1   Title  Patient will be adherent to HEP at least 3x a week to improve functional strength and balance for better safety at home.    Baseline  08/12/18- doing exercise daily;    Time  4    Period  Weeks    Status  Achieved    Target Date  08/17/18      PT SHORT TERM GOAL #2   Title    Patient will deny any falls over past 4 weeks to demonstrate improved safety awareness at home and work.     Baseline  08/12/18- fell 1 week ago;    Time  4    Period  Weeks    Status  Not Met    Target Date  08/17/18        PT Long Term Goals - 08/12/18 1134      PT LONG TERM GOAL #1   Title  Patient will increase Berg Balance score by > 6 points to demonstrate decreased fall risk during functional activities.    Time  8    Period  Weeks    Status  Achieved    Target Date  09/14/18      PT LONG TERM GOAL #2   Title  Patient will increase 10 meter walk test to >0.8 m/s as to improve gait speed for better community ambulation and to reduce fall risk.    Time  8    Period  Weeks    Status  Partially Met    Target Date  09/14/18      PT LONG TERM GOAL #3   Title  Patient will be require no assist with ascend/descend 3 steps using Least restrictive assistive device to improve safety with entry/exit home.     Time  8    Period  Weeks    Status  Partially Met    Target Date  09/14/18      PT LONG TERM GOAL #4   Title  Patient will increase BLE gross strength to 4+/5 as to improve functional strength for independent gait, increased standing tolerance and increased ADL ability.    Time  8    Period  Weeks    Status  Partially Met    Target Date  09/14/18            Plan - 08/26/18 1225    Clinical Impression Statement  Patient continues to demonstrate high levels of motivation for therapy. Improving foot clearance and single leg stability remains a big focus of session, in gait training, exercises, and stepping. Noted Right hip weakness contribution which exacerbates gait mechanics breakdown with fatigue. BUE needed for most activities d/t strength deficits and low confidence. Pt able to participate in retro stepping with minGuard assist but occasional freezing episodes when a LOB is perceived.    Comorbidities  HTN (controlled), hydrocephalus somewhat controlled, L5 pars  defect;     Examination-Activity Limitations  Bend;Caring for Others;Carry;Continence;Dressing;Lift;Locomotion Level;Stairs;Stand;Transfers    Examination-Participation Restrictions  Cleaning;Community Activity;Driving;Laundry;Meal Prep;Shop;Yard Work    Rehab Potential  Good    PT Frequency  2x / week    PT Duration  8 weeks    PT Treatment/Interventions  Cryotherapy;Electrical Stimulation;Moist Heat;Gait training;Stair training;Functional mobility training;Therapeutic activities;Therapeutic exercise;Balance training;Neuromuscular re-education;Patient/family education;Energy conservation    PT Next Visit Plan  Continue to work on balance activities and strengthening at next visit    PT [place]  will address next visit    Consulted and Agree with Plan of Care  Patient       Patient will benefit from skilled therapeutic intervention in order to improve the following deficits and impairments:  Abnormal gait, Decreased balance, Decreased endurance, Decreased mobility, Difficulty walking, Impaired perceived functional ability, Decreased activity tolerance, Decreased safety awareness  Visit Diagnosis: 1. Unsteadiness on feet   2. Difficulty in walking, not elsewhere classified   3. Muscle weakness (generalized)        Problem List Patient Active Problem List   Diagnosis Date Noted  . NPH (normal pressure hydrocephalus) ([place]) 07/30/2016  . Left leg weakness 04/20/2016  . Menopausal osteoporosis 03/04/2016  . Abnormal gait 12/18/2015  . Left foot drop 12/18/2015  . BP (high blood pressure) 08/24/2015  . Hydronephrosis, left 07/28/2015  . UPJ (ureteropelvic junction) obstruction 07/28/2015  . UTI (lower urinary tract infection) 07/14/2015  . Hydronephrosis 07/14/2015    12:37 PM, 08/26/18 Etta Grandchild, PT, DPT Physical Therapist - [facility]  Outpatient Physical Therapy- [place] [phone]     Etta Grandchild 08/26/2018,  12:33 PM  [place] MAIN [facility] SERVICES [address] [place], [place], [address] Phone: [phone]   Fax:  [phone]  Name: Abigail Hill MRN: [id number] Date of Birth: 18-Oct-1950

## 2018-08-30 ENCOUNTER — Other Ambulatory Visit: Payer: Self-pay

## 2018-08-30 ENCOUNTER — Encounter: Payer: Self-pay | Admitting: Physical Therapy

## 2018-08-30 ENCOUNTER — Ambulatory Visit: Payer: PPO | Admitting: Physical Therapy

## 2018-08-30 DIAGNOSIS — R2681 Unsteadiness on feet: Secondary | ICD-10-CM

## 2018-08-30 DIAGNOSIS — M6281 Muscle weakness (generalized): Secondary | ICD-10-CM

## 2018-08-30 DIAGNOSIS — R262 Difficulty in walking, not elsewhere classified: Secondary | ICD-10-CM

## 2018-08-30 NOTE — Therapy (Signed)
[place] MAIN [facility] SERVICES [address] [place], [place], [address] Phone: [phone]   Fax:  [phone]  Physical Therapy Treatment  Patient Details  Name: Abigail Hill MRN: [id number] Date of Birth: 1950-10-03 Referring Provider (PT): Dr. Metta Clines   Encounter Date: 08/30/2018  PT End of Session - 08/30/18 1139    Visit Number  14    Number of Visits  22    Date for PT Re-Evaluation  09/14/18    Authorization Type  goals addressed 07/20/18    PT Start Time  1132    PT Stop Time  1215    PT Time Calculation (min)  43 min    Activity Tolerance  Patient tolerated treatment well;Patient limited by fatigue    Behavior During Therapy  [facility] for tasks assessed/performed       Past Medical History:  Diagnosis Date  . Abnormal gait 12/18/2015  . BP (high blood pressure) 08/24/2015  . Cancer ([place])    skin  . Constriction of ureter (postoperative) 07/28/2015  . Hydronephrosis 07/14/2015  . Hypertension   . Left foot drop 12/18/2015  . Recurrent UTI   . UPJ (ureteropelvic junction) obstruction 07/28/2015  . UTI (lower urinary tract infection) 07/14/2015    Past Surgical History:  Procedure Laterality Date  . broken foot  2012  . COLONOSCOPY WITH PROPOFOL N/A 06/30/2017   Procedure: COLONOSCOPY WITH PROPOFOL;  Surgeon: Lollie Sails, MD;  Location: [facility] ENDOSCOPY;  Service: Endoscopy;  Laterality: N/A;  . kidney repair  1985   repaired torn blood vessel in kidney    There were no vitals filed for this visit.  Subjective Assessment - 08/30/18 1138    Subjective  Patient reports doing well; She denies any new falls; She denies any hip pain. She reports her exercises at home are going well;    Pertinent History  68 yo Female reports instability with gait with difficulty walking. She states, "my left leg gives me trouble. It will  just plant and then I can't pick it up." Pt reports 2 falls in last month. She reports when she falls it is  often falling backwards; She was diagnosed with hydrocephalus and did a drain trial with no relief; She did not qualify for a shunt. Repeat imaging showed continued hydrocephalus that has not worsened; MRI of lumbar spine shows L5 pars defect with sacral insufficiency; She denies any back pain and reports no numbness in legs. She reports feeling frustration because they can't seem to find what's wrong with her. She does have urinary incontience; reports knowing when she has to go to the bathroom but can't get there fast enough; Patient has a walker and rollator; She uses both; She also has a manual wheelchair that she uses intermittently;     Currently in Pain?  No/denies    Multiple Pain Sites  No                  TREATMENT: Warm up on Nustep BUE/BLE level 2 x4 min (Unbilled);  NMR: Stepping over hurdles #2,  reciprocally x12 laps with 1-0rail assist; patient able to progress to no rail assist;Patient requires close supervision with min VCs to increase step length and improve speed for better reciprocal stepping; Pt hesitant to reduce rail assist due to fear of falling;  Gait cross overs x10 feet x8 laps each direction with counter assist;  Resisted walking forward/backward 12.5# x2 laps with min A for safety with mod  VCs to increase step length and improve weight shift for better dynamic balance control; She was able to exhibit a more normal base of support with improved step length for reciprocal gait;  Weaving around cones #6 x2 laps with mod VCs to increase step length and to keep feet closer together for better dynamic balance;  Side stepping over cones #6 x1 lap each direction with cues to improve weight shift for better foot clearance;   Response to treatment:Patient continues to demonstrate excellent motivation for therapy. Pt was able to exhibit better base of support with less widened steps and improved step length progressing to intermittent reciprocal pattern.  Patient does better with increased repetition with improved motor planning and improved step position. She does fatigue at end of session;                                 PT Education - 08/30/18 1138    Education Details  exercise technique/balance and strengthening;    Person(s) Educated  Patient    Methods  Explanation;Verbal cues    Comprehension  Verbalized understanding;Returned demonstration;Verbal cues required;Need further instruction       PT Short Term Goals - 08/12/18 1134      PT SHORT TERM GOAL #1   Title  Patient will be adherent to HEP at least 3x a week to improve functional strength and balance for better safety at home.    Baseline  08/12/18- doing exercise daily;    Time  4    Period  Weeks    Status  Achieved    Target Date  08/17/18      PT SHORT TERM GOAL #2   Title   Patient will deny any falls over past 4 weeks to demonstrate improved safety awareness at home and work.     Baseline  08/12/18- fell 1 week ago;    Time  4    Period  Weeks    Status  Not Met    Target Date  08/17/18        PT Long Term Goals - 08/12/18 1134      PT LONG TERM GOAL #1   Title  Patient will increase Berg Balance score by > 6 points to demonstrate decreased fall risk during functional activities.    Time  8    Period  Weeks    Status  Achieved    Target Date  09/14/18      PT LONG TERM GOAL #2   Title  Patient will increase 10 meter walk test to >0.8 m/s as to improve gait speed for better community ambulation and to reduce fall risk.    Time  8    Period  Weeks    Status  Partially Met    Target Date  09/14/18      PT LONG TERM GOAL #3   Title  Patient will be require no assist with ascend/descend 3 steps using Least restrictive assistive device to improve safety with entry/exit home.     Time  8    Period  Weeks    Status  Partially Met    Target Date  09/14/18      PT LONG TERM GOAL #4   Title  Patient will increase BLE gross  strength to 4+/5 as to improve functional strength for independent gait, increased standing tolerance and increased ADL ability.    Time  8  Period  Weeks    Status  Partially Met    Target Date  09/14/18            Plan - 08/30/18 1300    Clinical Impression Statement  Patient tolerated session well. She was motivated throughout the session. Patient instructed in dynamic balance tasks to improve weight shift and gait safety; Patient would benefit from additional skilled PT intervention to improve strength, balance and gait safety;    Comorbidities  HTN (controlled), hydrocephalus somewhat controlled, L5 pars defect;     Examination-Activity Limitations  Bend;Caring for Others;Carry;Continence;Dressing;Lift;Locomotion Level;Stairs;Stand;Transfers    Examination-Participation Restrictions  Cleaning;Community Activity;Driving;Laundry;Meal Prep;Shop;Yard Work    Rehab Potential  Good    PT Frequency  2x / week    PT Duration  8 weeks    PT Treatment/Interventions  Cryotherapy;Electrical Stimulation;Moist Heat;Gait training;Stair training;Functional mobility training;Therapeutic activities;Therapeutic exercise;Balance training;Neuromuscular re-education;Patient/family education;Energy conservation    PT Next Visit Plan  Continue to work on balance activities and strengthening at next visit    PT [place]  will address next visit    Consulted and Agree with Plan of Care  Patient       Patient will benefit from skilled therapeutic intervention in order to improve the following deficits and impairments:  Abnormal gait, Decreased balance, Decreased endurance, Decreased mobility, Difficulty walking, Impaired perceived functional ability, Decreased activity tolerance, Decreased safety awareness  Visit Diagnosis: 1. Unsteadiness on feet   2. Difficulty in walking, not elsewhere classified   3. Muscle weakness (generalized)        Problem List Patient Active Problem List    Diagnosis Date Noted  . NPH (normal pressure hydrocephalus) ([place]) 07/30/2016  . Left leg weakness 04/20/2016  . Menopausal osteoporosis 03/04/2016  . Abnormal gait 12/18/2015  . Left foot drop 12/18/2015  . BP (high blood pressure) 08/24/2015  . Hydronephrosis, left 07/28/2015  . UPJ (ureteropelvic junction) obstruction 07/28/2015  . UTI (lower urinary tract infection) 07/14/2015  . Hydronephrosis 07/14/2015    Trotter,Margaret PT, DPT 08/30/2018, 1:05 PM  [place] MAIN [facility] SERVICES [address] [place], [place], [address] Phone: [phone]   Fax:  [phone]  Name: Abigail Hill MRN: [id number] Date of Birth: 1950/06/15

## 2018-08-31 NOTE — Therapy (Signed)
[place] MAIN [facility] SERVICES [address] [place], [place], [address] Phone: [phone]   Fax:  [phone]  Patient Details  Name: Abigail Hill MRN: [id number] Date of Birth: [birth year] Referring Provider:  Pieter Partridge, DO  Encounter Date: 05/25/2018    The patient has been contacted today in regards to telehealth services. The patient expressed an interest in participating in telehealth visits. She has the technology to be able to participate in the visit and her son lives with her who can also assist. Patient has been informed that an [facility] support representative will be reaching out to them to verify their insurance benefits and for scheduling      Lyndel Safe Huprich PT, DPT, GCS  Huprich,Jason 06/17/2018, 11:07 AM  This was not a visit, no charges associated.  Note ran to remove chart from open chart list.  Norell Brisbin 08/31/2018, 9:56 AM  [place], [place], [address] Phone: [phone]   Fax:  [phone]

## 2018-09-01 ENCOUNTER — Ambulatory Visit: Payer: PPO | Admitting: Physical Therapy

## 2018-09-02 ENCOUNTER — Other Ambulatory Visit: Payer: Self-pay

## 2018-09-02 ENCOUNTER — Encounter: Payer: Self-pay | Admitting: Physical Therapy

## 2018-09-02 ENCOUNTER — Ambulatory Visit: Payer: PPO | Attending: Neurology | Admitting: Physical Therapy

## 2018-09-02 DIAGNOSIS — R2681 Unsteadiness on feet: Secondary | ICD-10-CM | POA: Insufficient documentation

## 2018-09-02 DIAGNOSIS — M6281 Muscle weakness (generalized): Secondary | ICD-10-CM | POA: Diagnosis not present

## 2018-09-02 DIAGNOSIS — R29898 Other symptoms and signs involving the musculoskeletal system: Secondary | ICD-10-CM | POA: Diagnosis not present

## 2018-09-02 DIAGNOSIS — R262 Difficulty in walking, not elsewhere classified: Secondary | ICD-10-CM | POA: Insufficient documentation

## 2018-09-02 DIAGNOSIS — R269 Unspecified abnormalities of gait and mobility: Secondary | ICD-10-CM

## 2018-09-02 NOTE — Therapy (Signed)
[place] MAIN [facility] SERVICES [address] [place], [place], [address] Phone: [phone]   Fax:  [phone]  Physical Therapy Treatment  Patient Details  Name: Abigail Hill MRN: [id number] Date of Birth: 12-Oct-1950 Referring Provider (PT): Dr. Metta Clines   Encounter Date: 09/02/2018  PT End of Session - 09/02/18 1311    Visit Number  15    Number of Visits  22    Date for PT Re-Evaluation  09/14/18    Authorization Type  goals addressed 07/20/18    PT Start Time  1133    PT Stop Time  1216    PT Time Calculation (min)  43 min    Equipment Utilized During Treatment  Gait belt    Activity Tolerance  Patient limited by fatigue    Behavior During Therapy  [facility] for tasks assessed/performed       Past Medical History:  Diagnosis Date  . Abnormal gait 12/18/2015  . BP (high blood pressure) 08/24/2015  . Cancer ([place])    skin  . Constriction of ureter (postoperative) 07/28/2015  . Hydronephrosis 07/14/2015  . Hypertension   . Left foot drop 12/18/2015  . Recurrent UTI   . UPJ (ureteropelvic junction) obstruction 07/28/2015  . UTI (lower urinary tract infection) 07/14/2015    Past Surgical History:  Procedure Laterality Date  . broken foot  2012  . COLONOSCOPY WITH PROPOFOL N/A 06/30/2017   Procedure: COLONOSCOPY WITH PROPOFOL;  Surgeon: Lollie Sails, MD;  Location: [facility] ENDOSCOPY;  Service: Endoscopy;  Laterality: N/A;  . kidney repair  1985   repaired torn blood vessel in kidney    There were no vitals filed for this visit.  Subjective Assessment - 09/02/18 1306    Subjective  Patient reports that she has been practicing her balance exercises such as alternating marching and tandem stance at home using high back chair.  She denies any new falls.    Pertinent History  68 yo Female reports instability with gait with difficulty walking. She states, "my left leg gives me trouble. It will  just plant and then I can't pick it up." Pt  reports 2 falls in last month. She reports when she falls it is often falling backwards; She was diagnosed with hydrocephalus and did a drain trial with no relief; She did not qualify for a shunt. Repeat imaging showed continued hydrocephalus that has not worsened; MRI of lumbar spine shows L5 pars defect with sacral insufficiency; She denies any back pain and reports no numbness in legs. She reports feeling frustration because they can't seem to find what's wrong with her. She does have urinary incontience; reports knowing when she has to go to the bathroom but can't get there fast enough; Patient has a walker and rollator; She uses both; She also has a manual wheelchair that she uses intermittently;     Limitations  Standing;Walking    How long can you sit comfortably?  NA    How long can you stand comfortably?  has pain in LLE with prolonged stooping; <10 min;     How long can you walk comfortably?  200 feet with assistance requiring several breaks;     Diagnostic tests  has had MRI and brain imaging;     Patient Stated Goals  "Be able to travel more, little sight seeing; walk short distances" Patient reports that she would like to be able to get rid of the walker.     Currently in  Pain?  No/denies    Pain Score  0-No pain    Pain Onset  More than a month ago          PT Treatment:  Warm Up on Nustep L3 x 5 mins (no charge)  Neuromuscular Re-Education:  Tandem stance in // bars with finger tip on bar; Alt marching in place with no UE support 2x10; SLS in // bars with no UE support alternating LE's; Fwd ambulation around cones on laminated floor with CGA x1; narrow BOS on blue AirEx foam pad with EO/EC; Alt toe tapping on 9" stool with no UE support in // bars and SBAx1.  Therapeutic Exercise:  Sit to Stand from green chair 1x6, then 1x8 reps with no use of UE's.  Leg Press machine 90# 2x10; then 2x10 calf raises with 90# on Leg Press machine.                     PT  Education - 09/02/18 1310    Education Details  Exercise technique/balance and strengthening    Person(s) Educated  Patient    Methods  Explanation;Verbal cues    Comprehension  Returned demonstration       PT Short Term Goals - 08/12/18 1134      PT SHORT TERM GOAL #1   Title  Patient will be adherent to HEP at least 3x a week to improve functional strength and balance for better safety at home.    Baseline  08/12/18- doing exercise daily;    Time  4    Period  Weeks    Status  Achieved    Target Date  08/17/18      PT SHORT TERM GOAL #2   Title   Patient will deny any falls over past 4 weeks to demonstrate improved safety awareness at home and work.     Baseline  08/12/18- fell 1 week ago;    Time  4    Period  Weeks    Status  Not Met    Target Date  08/17/18        PT Long Term Goals - 08/12/18 1134      PT LONG TERM GOAL #1   Title  Patient will increase Berg Balance score by > 6 points to demonstrate decreased fall risk during functional activities.    Time  8    Period  Weeks    Status  Achieved    Target Date  09/14/18      PT LONG TERM GOAL #2   Title  Patient will increase 10 meter walk test to >0.8 m/s as to improve gait speed for better community ambulation and to reduce fall risk.    Time  8    Period  Weeks    Status  Partially Met    Target Date  09/14/18      PT LONG TERM GOAL #3   Title  Patient will be require no assist with ascend/descend 3 steps using Least restrictive assistive device to improve safety with entry/exit home.     Time  8    Period  Weeks    Status  Partially Met    Target Date  09/14/18      PT LONG TERM GOAL #4   Title  Patient will increase BLE gross strength to 4+/5 as to improve functional strength for independent gait, increased standing tolerance and increased ADL ability.    Time  8    Period  Weeks    Status  Partially Met    Target Date  09/14/18            Plan - 09/02/18 1312    Clinical Impression  Statement  Pt responded well to motivational cueing with tandem stance and SLS in // bars for NMR today.  She also responded well to verbal cueing for improved BOS and improved heel strike with ambulation in the gym.    Personal Factors and Comorbidities  Age;Time since onset of injury/illness/exacerbation;Comorbidity 3+    Comorbidities  HTN (controlled), hydrocephalus somewhat controlled, L5 pars defect;     Examination-Activity Limitations  Bend;Caring for Others;Carry;Continence;Dressing;Lift;Locomotion Level;Stairs;Stand;Transfers    Examination-Participation Restrictions  Cleaning;Community Activity;Driving;Laundry;Meal Prep;Shop;Yard Work    Stability/Clinical Decision Making  Evolving/Moderate complexity    Clinical Decision Making  Moderate    Rehab Potential  Good    PT Frequency  2x / week    PT Duration  8 weeks    PT Treatment/Interventions  Cryotherapy;Electrical Stimulation;Moist Heat;Gait training;Stair training;Functional mobility training;Therapeutic activities;Therapeutic exercise;Balance training;Neuromuscular re-education;Patient/family education;Energy conservation    PT Next Visit Plan  Continue to work on balance activities and strengthening at next visit    PT [place]  Reviewed tandem stance and SLS at [place] with pt at today's visit.    Consulted and Agree with Plan of Care  Patient       Patient will benefit from skilled therapeutic intervention in order to improve the following deficits and impairments:  Abnormal gait, Decreased balance, Decreased endurance, Decreased mobility, Difficulty walking, Impaired perceived functional ability, Decreased activity tolerance, Decreased safety awareness  Visit Diagnosis: 1. Abnormal gait   2. Left leg weakness        Problem List Patient Active Problem List   Diagnosis Date Noted  . NPH (normal pressure hydrocephalus) ([place]) 07/30/2016  . Left leg weakness 04/20/2016  . Menopausal osteoporosis  03/04/2016  . Abnormal gait 12/18/2015  . Left foot drop 12/18/2015  . BP (high blood pressure) 08/24/2015  . Hydronephrosis, left 07/28/2015  . UPJ (ureteropelvic junction) obstruction 07/28/2015  . UTI (lower urinary tract infection) 07/14/2015  . Hydronephrosis 07/14/2015    Darleth Eustache, MPT 09/02/2018, 1:16 PM  [place] MAIN [facility] SERVICES [address] [place], [place], [address] Phone: [phone]   Fax:  [phone]  Name: Abigail Hill MRN: [id number] Date of Birth: August 27, 1950

## 2018-09-06 ENCOUNTER — Ambulatory Visit
Admission: RE | Admit: 2018-09-06 | Discharge: 2018-09-06 | Disposition: A | Discharge status: Home / Self Care | Payer: PPO | Source: Ambulatory Visit | Attending: Urology | Admitting: Urology

## 2018-09-06 ENCOUNTER — Ambulatory Visit: Payer: PPO | Admitting: Physical Therapy

## 2018-09-06 ENCOUNTER — Other Ambulatory Visit: Payer: Self-pay

## 2018-09-06 ENCOUNTER — Ambulatory Visit: Payer: PPO

## 2018-09-06 DIAGNOSIS — N135 Crossing vessel and stricture of ureter without hydronephrosis: Secondary | ICD-10-CM | POA: Diagnosis not present

## 2018-09-06 DIAGNOSIS — N1330 Unspecified hydronephrosis: Secondary | ICD-10-CM | POA: Diagnosis not present

## 2018-09-06 DIAGNOSIS — N131 Hydronephrosis with ureteral stricture, not elsewhere classified: Secondary | ICD-10-CM | POA: Diagnosis not present

## 2018-09-06 DIAGNOSIS — K8020 Calculus of gallbladder without cholecystitis without obstruction: Secondary | ICD-10-CM | POA: Insufficient documentation

## 2018-09-07 ENCOUNTER — Encounter: Payer: Self-pay | Admitting: Urology

## 2018-09-07 ENCOUNTER — Ambulatory Visit: Payer: PPO | Admitting: Urology

## 2018-09-07 VITALS — BP 158/89 | HR 89 | Ht 61.5 in | Wt 162.0 lb

## 2018-09-07 DIAGNOSIS — B379 Candidiasis, unspecified: Secondary | ICD-10-CM | POA: Diagnosis not present

## 2018-09-07 DIAGNOSIS — N135 Crossing vessel and stricture of ureter without hydronephrosis: Secondary | ICD-10-CM | POA: Diagnosis not present

## 2018-09-07 DIAGNOSIS — N1330 Unspecified hydronephrosis: Secondary | ICD-10-CM | POA: Diagnosis not present

## 2018-09-07 DIAGNOSIS — N3941 Urge incontinence: Secondary | ICD-10-CM

## 2018-09-07 LAB — BLADDER SCAN AMB NON-IMAGING

## 2018-09-07 NOTE — Progress Notes (Signed)
09/07/2018 9:44 AM   [name] 1950/12/29 [id number]  Referring provider: Baxter Hire, MD [place],  [place] [address]  Chief Complaint  Patient presents with  . Hydronephrosis    Follow up    HPI: 68 year old female who returns today for follow-up of urinary symptoms as well as chronic left UPJ obstruction.  She is elected conservative management for history of left UPJ obstruction.  She denies any recent flank pain, hematuria, stones, or any other issues.  Follow-up renal ultrasound today shows stable severe left hydronephrosis without a ureteral jet.  This is unchanged from previous.  Most recent Lasix renogram on 08/20/2016 shows split function of 57% of the left, 43 and the rate with delayed nephrogram on the left significantly with very little to no excretion of tracer. Patient alsohas impaired perfusion uptake.  She did have surgery on her left kidney 30 years ago by Dr. Redmond Baseman at which time it sounds like she had a UPJ repair (open pyeloplasty). She has a flank incision on the left.   Cr remains stable at 0.9 03/2018.  Addition to the above, she was seen several months ago for worsening urinary incontinence, urgency and frequency.  She is been taking Myrbetriq 25 mg which is well-tolerated.  She reports dramatic improvement in her daytime as well as nighttime urinary continence.  She has decreased accidents.  She continues to work with PT for mobility.  She is very pleased with the result.  She also had complete resolution of her inguinal/suprapubic rash with use of Mycolog ointment.   PMH: Past Medical History:  Diagnosis Date  . Abnormal gait 12/18/2015  . BP (high blood pressure) 08/24/2015  . Cancer ([place])    skin  . Constriction of ureter (postoperative) 07/28/2015  . Hydronephrosis 07/14/2015  . Hypertension   . Left foot drop 12/18/2015  . Recurrent UTI   . UPJ (ureteropelvic junction) obstruction 07/28/2015  . UTI (lower urinary  tract infection) 07/14/2015    Surgical History: Past Surgical History:  Procedure Laterality Date  . broken foot  2012  . COLONOSCOPY WITH PROPOFOL N/A 06/30/2017   Procedure: COLONOSCOPY WITH PROPOFOL;  Surgeon: Lollie Sails, MD;  [facility] ENDOSCOPY;  Service: Endoscopy;  Laterality: N/A;  . kidney repair  1985   repaired torn blood vessel in kidney    Home Medications:  Allergies as of 09/07/2018      Reactions   Oxycodone       Medication List       Accurate as of September 07, 2018  9:44 AM. If you have any questions, ask your nurse or doctor.        alendronate 70 MG tablet Commonly known as: FOSAMAX   aspirin 81 MG chewable tablet Chew 81 mg by mouth daily.   CALTRATE PLUS PO Take by mouth.   CENTRUM SILVER PO Take by mouth.   mirabegron ER 25 MG Tb24 tablet Commonly known as: MYRBETRIQ Take 1 tablet (25 mg total) by mouth daily.   nystatin-triamcinolone ointment Commonly known as: MYCOLOG Apply 1 application topically 2 (two) times daily.       Allergies:  Allergies  Allergen Reactions  . Oxycodone     Family History: Family History  Problem Relation Age of Onset  . Kidney disease Neg Hx   . Bladder Cancer Neg Hx     Social History:  reports that she has quit smoking. She has never used smokeless tobacco. She reports that she  does not drink alcohol or use drugs.  ROS: UROLOGY Frequent Urination?: No Hard to postpone urination?: No Burning/pain with urination?: No Get up at night to urinate?: No Leakage of urine?: No Urine stream starts and stops?: No Trouble starting stream?: No Do you have to strain to urinate?: No Blood in urine?: No Urinary tract infection?: No Sexually transmitted disease?: No Injury to kidneys or bladder?: No Painful intercourse?: No Weak stream?: No Currently pregnant?: No Vaginal bleeding?: No Last menstrual period?: n  Gastrointestinal Nausea?: No Vomiting?: No Indigestion/heartburn?: No  Diarrhea?: No Constipation?: No  Constitutional Fever: No Night sweats?: No Weight loss?: No Fatigue?: No  Skin Skin rash/lesions?: No Itching?: No  Eyes Blurred vision?: No Double vision?: No  Ears/Nose/Throat Sore throat?: No Sinus problems?: No  Hematologic/Lymphatic Swollen glands?: No Easy bruising?: No  Cardiovascular Leg swelling?: No Chest pain?: No  Respiratory Cough?: No Shortness of breath?: No  Endocrine Excessive thirst?: No  Musculoskeletal Back pain?: No Joint pain?: No  Neurological Headaches?: No Dizziness?: No  Psychologic Depression?: No Anxiety?: No  Physical Exam: BP (!) 158/89   Pulse 89   Ht 5' 1.5" (1.562 m)   Wt 162 lb (73.5 kg)   BMI 30.11 kg/m   Constitutional:  Alert and oriented, No acute distress. HEENT: [place] AT, moist mucus membranes.  Trachea midline, no masses. Cardiovascular: No clubbing, cyanosis, or edema. Respiratory: Normal respiratory effort, no increased work of breathing. Skin: No rashes, bruises or suspicious lesions. Neurologic: Grossly intact, no focal deficits, moving all 4 extremities. Psychiatric: Normal mood and affect.  Laboratory Data: Cr as above  Urinalysis    Component Value Date/Time   COLORURINE YELLOW (A) 06/13/2015 0221   APPEARANCEUR Clear 06/30/2018 1454   LABSPEC 1.021 06/13/2015 0221   PHURINE 5.0 06/13/2015 0221   GLUCOSEU Negative 06/30/2018 1454   HGBUR 1+ (A) 06/13/2015 0221   BILIRUBINUR Negative 06/30/2018 1454   KETONESUR TRACE (A) 06/13/2015 0221   PROTEINUR Negative 06/30/2018 1454   PROTEINUR 30 (A) 06/13/2015 0221   NITRITE Negative 06/30/2018 1454   NITRITE POSITIVE (A) 06/13/2015 0221   LEUKOCYTESUR Trace (A) 06/30/2018 1454    Lab Results  Component Value Date   LABMICR See below: 06/30/2018   WBCUA 0-5 06/30/2018   RBCUA 0-2 07/10/2015   LABEPIT 0-10 06/30/2018   BACTERIA None seen 06/30/2018    Pertinent Imaging: Results for orders placed during the  hospital encounter of 09/06/18  US RENAL   Narrative CLINICAL DATA:  68 year old female with history of left UPJ obstruction and left-sided hydronephrosis.  EXAM: RENAL / URINARY TRACT ULTRASOUND COMPLETE  COMPARISON:  Renal ultrasound dated 08/18/2017 and CT of the abdomen pelvis dated 06/13/2015  FINDINGS: Right Kidney:  Renal measurements: 10.5 x 5.0 x 5.2 cm = volume: 142 mL. Normal echogenicity. No hydronephrosis or shadowing stone. There is an extrarenal pelvis with mild pelviectasis.  Left Kidney:  Renal measurements: 11.2 x 5.2 x 4.0 cm = volume: 124 mL. Severe left hydronephrosis with severe dilatation of the left renal pelvis similar to prior ultrasound and compatible with history of left UPJ obstruction. These findings are relatively similar to the CT of 06/13/2015 when accounting for difference in modality. No definite stone identified in the visualized left kidney. There is compression of the left renal parenchyma secondary to hydronephrosis and enlarged pelvis.  Bladder:  The urinary bladder is grossly unremarkable for the degree of distention. Right ureteral jets identified. The left ureteral jet is not visualized.  Incidental note  of gallstone.  IMPRESSION: 1. Severe left hydronephrosis similar to prior ultrasound and CT. 2. Nonvisualization of the left ureteral jet. 3. Cholelithiasis.   Electronically Signed   By: Anner Crete M.D.   On: 09/06/2018 15:09    Ultrasound was personally reviewed.  This is stable from previous, unchanged severe left hydronephrosis.   Results for orders placed or performed in visit on 09/07/18  Bladder Scan (Post Void Residual) in office  Result Value Ref Range   Scan Result 61ml      Assessment & Plan:    1. Hydronephrosis, left Stable chronic severe left hydronephrosis with impaired drainage Previously discussed management options for this chronic UPJ obstruction including percutaneous nephrostomy tube,  ureteral stent, revision surgery She continues to elect for conservative management which is reasonable Overall renal function is preserved We will defer imaging next year and consider repeating if her creatinine starts to rise or she has any other issues with her kidneys/infections, etc. - Bladder Scan (Post Void Residual) in office  2. UPJ (ureteropelvic junction) obstruction As above  3. Urge incontinence Symptoms significantly improved on Myrbetriq 25 mg We will continue this medication Adequate bladder emptying Patient is aware we could increase the dose if her symptoms worsen  4. Candidiasis Resolved   Return in about 1 year (around 09/07/2019) for PVR, recheck symptoms.  Hollice Espy, MD  [facility] [address], [place],  [address] [phone]

## 2018-09-08 ENCOUNTER — Ambulatory Visit: Payer: PPO | Admitting: Physical Therapy

## 2018-09-09 ENCOUNTER — Other Ambulatory Visit: Payer: Self-pay

## 2018-09-09 ENCOUNTER — Encounter: Payer: Self-pay | Admitting: Physical Therapy

## 2018-09-09 ENCOUNTER — Ambulatory Visit: Payer: PPO | Admitting: Physical Therapy

## 2018-09-09 DIAGNOSIS — R269 Unspecified abnormalities of gait and mobility: Secondary | ICD-10-CM | POA: Diagnosis not present

## 2018-09-09 DIAGNOSIS — R262 Difficulty in walking, not elsewhere classified: Secondary | ICD-10-CM

## 2018-09-09 DIAGNOSIS — M6281 Muscle weakness (generalized): Secondary | ICD-10-CM

## 2018-09-09 DIAGNOSIS — R2681 Unsteadiness on feet: Secondary | ICD-10-CM

## 2018-09-09 NOTE — Therapy (Signed)
[place] MAIN [facility] SERVICES [address] [place], [place], [address] Phone: [phone]   Fax:  [phone]  Physical Therapy Treatment  Patient Details  Name: Abigail Hill MRN: [id number] Date of Birth: [birth year] Referring Provider (PT): Dr. Metta Clines   Encounter Date: 09/09/2018  PT End of Session - 09/09/18 1111    Visit Number  16    Number of Visits  22    Date for PT Re-Evaluation  09/14/18    Authorization Type  goals addressed 07/20/18    PT Start Time  1105    PT Stop Time  1145    PT Time Calculation (min)  40 min    Equipment Utilized During Treatment  Gait belt    Activity Tolerance  Patient limited by fatigue    Behavior During Therapy  [facility] for tasks assessed/performed       Past Medical History:  Diagnosis Date  . Abnormal gait 12/18/2015  . BP (high blood pressure) 08/24/2015  . Cancer ([place])    skin  . Constriction of ureter (postoperative) 07/28/2015  . Hydronephrosis 07/14/2015  . Hypertension   . Left foot drop 12/18/2015  . Recurrent UTI   . UPJ (ureteropelvic junction) obstruction 07/28/2015  . UTI (lower urinary tract infection) 07/14/2015    Past Surgical History:  Procedure Laterality Date  . broken foot  2012  . COLONOSCOPY WITH PROPOFOL N/A 06/30/2017   Procedure: COLONOSCOPY WITH PROPOFOL;  Surgeon: Lollie Sails, MD;  Location: [facility] ENDOSCOPY;  Service: Endoscopy;  Laterality: N/A;  . kidney repair  1985   repaired torn blood vessel in kidney    There were no vitals filed for this visit.  Subjective Assessment - 09/09/18 1110    Subjective  Patient reports doing okay; She reports having a lot of company over and has been busy with appointments and as a result her walking has been more impaired. She denies any falls    Pertinent History  68 yo Female reports instability with gait with difficulty walking. She states, "my left leg gives me trouble. It will  just plant and then I can't pick it up."  Pt reports 2 falls in last month. She reports when she falls it is often falling backwards; She was diagnosed with hydrocephalus and did a drain trial with no relief; She did not qualify for a shunt. Repeat imaging showed continued hydrocephalus that has not worsened; MRI of lumbar spine shows L5 pars defect with sacral insufficiency; She denies any back pain and reports no numbness in legs. She reports feeling frustration because they can't seem to find what's wrong with her. She does have urinary incontience; reports knowing when she has to go to the bathroom but can't get there fast enough; Patient has a walker and rollator; She uses both; She also has a manual wheelchair that she uses intermittently;     Limitations  Standing;Walking    How long can you sit comfortably?  NA    How long can you stand comfortably?  has pain in LLE with prolonged stooping; <10 min;     How long can you walk comfortably?  200 feet with assistance requiring several breaks;     Diagnostic tests  has had MRI and brain imaging;     Patient Stated Goals  "Be able to travel more, little sight seeing; walk short distances" Patient reports that she would like to be able to get rid of the walker.  Currently in Pain?  No/denies    Pain Onset  More than a month ago    Multiple Pain Sites  No            TREATMENT: Warm up on Nustep BUE/BLE level 2 x4 min (Unbilled);  NMR: Stepping over hurdles #2,  reciprocally x12 laps with 1-0rail assist; patient able to progress to no rail assist;Patient requires close supervision with min VCs to increase step length and improve speed for better reciprocal stepping; Pt hesitant to reduce rail assist due to fear of falling;  Patient exhibits heavy step with LLE due to decreased weight shift to RLE during stance; PT instructed patient in standing on RLE, LLE hip flexion march x10 reps with cues for weight shift and to avoid falling to LLE for better foot clearance; Patient able  to exhibit better weight shift with increased repetition with less heavy step with LLE;  Staggered stance, Forward/backward weight shift with tactile/verbal cues to facilitate better RLE hip extension with gluteal activation during mid-terminal stance to facilitate better weight shift to improve foot clearance on LLE x10 reps x2 sets;  Gait overground, level surface with cues for weight shift to RLE with less lateral weight shift but cues for forward weight shift with increased gluteal activation x50 feet x2 sets with less wide base of support and improved step length on LLE for reciprocal gait pattern, inconsistent;    Resisted walking forward/backward 12.5# x2 laps, side step 7.5# x2 laps with min A for safety with mod VCs to increase step length and improve weight shift for better dynamic balance control; She was able to exhibit a more normal base of support with improved step length for reciprocal gait with increased repetition;     Response to treatment:Patient continues to demonstrate excellent motivation for therapy. Pt was able to exhibit better base of support with less widened steps and improved step length progressing to intermittent reciprocal pattern. Patient does better with increased repetition with improved motor planning and improved step position. She does fatigue at end of session;                      PT Education - 09/09/18 1110    Education Details  exercise technique/balance/gait safety;    Person(s) Educated  Patient    Methods  Explanation;Verbal cues    Comprehension  Verbalized understanding;Returned demonstration;Verbal cues required;Need further instruction       PT Short Term Goals - 08/12/18 1134      PT SHORT TERM GOAL #1   Title  Patient will be adherent to HEP at least 3x a week to improve functional strength and balance for better safety at home.    Baseline  08/12/18- doing exercise daily;    Time  4    Period  Weeks    Status   Achieved    Target Date  08/17/18      PT SHORT TERM GOAL #2   Title   Patient will deny any falls over past 4 weeks to demonstrate improved safety awareness at home and work.     Baseline  08/12/18- fell 1 week ago;    Time  4    Period  Weeks    Status  Not Met    Target Date  08/17/18        PT Long Term Goals - 08/12/18 1134      PT LONG TERM GOAL #1   Title  Patient will increase Merrilee Jansky  Balance score by > 6 points to demonstrate decreased fall risk during functional activities.    Time  8    Period  Weeks    Status  Achieved    Target Date  09/14/18      PT LONG TERM GOAL #2   Title  Patient will increase 10 meter walk test to >0.8 m/s as to improve gait speed for better community ambulation and to reduce fall risk.    Time  8    Period  Weeks    Status  Partially Met    Target Date  09/14/18      PT LONG TERM GOAL #3   Title  Patient will be require no assist with ascend/descend 3 steps using Least restrictive assistive device to improve safety with entry/exit home.     Time  8    Period  Weeks    Status  Partially Met    Target Date  09/14/18      PT LONG TERM GOAL #4   Title  Patient will increase BLE gross strength to 4+/5 as to improve functional strength for independent gait, increased standing tolerance and increased ADL ability.    Time  8    Period  Weeks    Status  Partially Met    Target Date  09/14/18            Plan - 09/09/18 1246    Clinical Impression Statement  Patient motivated and participated well within session; patient exhibits decreased weight shift to RLE during stance which limits LLE hip flexion during swing leading to short step length on LLE. PT focused on improving RLE hip extension during mid-terminal stance for gluteal activation to facilitate better RLE weight shift for better LLE foot clearance. Patient able to exhibit less wide base of support and improved step length with cues. Patient would benefit from additional skilled PT  intervention to improve strength, balance and gait safety;    Personal Factors and Comorbidities  Age;Time since onset of injury/illness/exacerbation;Comorbidity 3+    Comorbidities  HTN (controlled), hydrocephalus somewhat controlled, L5 pars defect;     Examination-Activity Limitations  Bend;Caring for Others;Carry;Continence;Dressing;Lift;Locomotion Level;Stairs;Stand;Transfers    Examination-Participation Restrictions  Cleaning;Community Activity;Driving;Laundry;Meal Prep;Shop;Yard Work    Stability/Clinical Decision Making  Evolving/Moderate complexity    Rehab Potential  Good    PT Frequency  2x / week    PT Duration  8 weeks    PT Treatment/Interventions  Cryotherapy;Electrical Stimulation;Moist Heat;Gait training;Stair training;Functional mobility training;Therapeutic activities;Therapeutic exercise;Balance training;Neuromuscular re-education;Patient/family education;Energy conservation    PT Next Visit Plan  Continue to work on balance activities and strengthening at next visit    PT [place]  Reviewed tandem stance and SLS at [place] with pt at today's visit.    Consulted and Agree with Plan of Care  Patient       Patient will benefit from skilled therapeutic intervention in order to improve the following deficits and impairments:  Abnormal gait, Decreased balance, Decreased endurance, Decreased mobility, Difficulty walking, Impaired perceived functional ability, Decreased activity tolerance, Decreased safety awareness  Visit Diagnosis: 1. Unsteadiness on feet   2. Difficulty in walking, not elsewhere classified   3. Muscle weakness (generalized)        Problem List Patient Active Problem List   Diagnosis Date Noted  . NPH (normal pressure hydrocephalus) ([place]) 07/30/2016  . Left leg weakness 04/20/2016  . Menopausal osteoporosis 03/04/2016  . Abnormal gait 12/18/2015  . Left foot drop 12/18/2015  .  BP (high blood pressure) 08/24/2015  . Hydronephrosis,  left 07/28/2015  . UPJ (ureteropelvic junction) obstruction 07/28/2015  . UTI (lower urinary tract infection) 07/14/2015  . Hydronephrosis 07/14/2015    Trotter,Margaret PT, DPT 09/09/2018, 12:48 PM  [facility] MAIN [facility] SERVICES [address] [place], [place], [address] Phone: [phone]   Fax:  [phone]  Name: Abigail Hill MRN: [id number] Date of Birth: 1950-05-07

## 2018-09-13 ENCOUNTER — Ambulatory Visit: Payer: PPO | Admitting: Physical Therapy

## 2018-09-15 ENCOUNTER — Ambulatory Visit: Payer: PPO | Admitting: Physical Therapy

## 2018-09-16 ENCOUNTER — Ambulatory Visit: Payer: PPO | Admitting: Physical Therapy

## 2018-09-21 ENCOUNTER — Ambulatory Visit: Payer: PPO | Admitting: Physical Therapy

## 2018-09-22 DIAGNOSIS — I10 Essential (primary) hypertension: Secondary | ICD-10-CM | POA: Diagnosis not present

## 2018-09-23 ENCOUNTER — Encounter: Payer: Self-pay | Admitting: Physical Therapy

## 2018-09-23 ENCOUNTER — Other Ambulatory Visit: Payer: Self-pay

## 2018-09-23 ENCOUNTER — Ambulatory Visit: Payer: PPO | Admitting: Physical Therapy

## 2018-09-23 DIAGNOSIS — R269 Unspecified abnormalities of gait and mobility: Secondary | ICD-10-CM | POA: Diagnosis not present

## 2018-09-23 DIAGNOSIS — M6281 Muscle weakness (generalized): Secondary | ICD-10-CM

## 2018-09-23 DIAGNOSIS — R262 Difficulty in walking, not elsewhere classified: Secondary | ICD-10-CM

## 2018-09-23 DIAGNOSIS — R2681 Unsteadiness on feet: Secondary | ICD-10-CM

## 2018-09-23 NOTE — Therapy (Signed)
[place] MAIN [facility] SERVICES [address] [place], [place], [address] Phone: [phone]   Fax:  [phone]  Physical Therapy Treatment/Re-cert  Patient Details  Name: Abigail Hill MRN: [id number] Date of Birth: August 16, 1950 Referring Provider (PT): Dr. Metta Clines   Encounter Date: 09/23/2018  PT End of Session - 09/23/18 1148    Visit Number  17    Number of Visits  30    Date for PT Re-Evaluation  10/21/18    Authorization Type  goals addressed 08/12/18,    PT Start Time  1142    PT Stop Time  1230    PT Time Calculation (min)  48 min    Equipment Utilized During Treatment  Gait belt    Activity Tolerance  Patient limited by fatigue;Patient tolerated treatment well    Behavior During Therapy  [facility] for tasks assessed/performed       Past Medical History:  Diagnosis Date  . Abnormal gait 12/18/2015  . BP (high blood pressure) 08/24/2015  . Cancer ([place])    skin  . Constriction of ureter (postoperative) 07/28/2015  . Hydronephrosis 07/14/2015  . Hypertension   . Left foot drop 12/18/2015  . Recurrent UTI   . UPJ (ureteropelvic junction) obstruction 07/28/2015  . UTI (lower urinary tract infection) 07/14/2015    Past Surgical History:  Procedure Laterality Date  . broken foot  2012  . COLONOSCOPY WITH PROPOFOL N/A 06/30/2017   Procedure: COLONOSCOPY WITH PROPOFOL;  Surgeon: Lollie Sails, MD;  Location: [facility] ENDOSCOPY;  Service: Endoscopy;  Laterality: N/A;  . kidney repair  1985   repaired torn blood vessel in kidney    There were no vitals filed for this visit.  Subjective Assessment - 09/23/18 1146    Subjective  Patient reports having a good vacation. She reports her husband had a bladder stone and has been having trouble so she has been trying to help out more and as a result over did it at home. She reports some soreness in BLE and general fatigue. She denies any new falls. Reports mixed adherence with HEP due to having to  help her husband and being too tired.    Pertinent History  68 yo Female reports instability with gait with difficulty walking. She states, "my left leg gives me trouble. It will  just plant and then I can't pick it up." Pt reports 2 falls in last month. She reports when she falls it is often falling backwards; She was diagnosed with hydrocephalus and did a drain trial with no relief; She did not qualify for a shunt. Repeat imaging showed continued hydrocephalus that has not worsened; MRI of lumbar spine shows L5 pars defect with sacral insufficiency; She denies any back pain and reports no numbness in legs. She reports feeling frustration because they can't seem to find what's wrong with her. She does have urinary incontience; reports knowing when she has to go to the bathroom but can't get there fast enough; Patient has a walker and rollator; She uses both; She also has a manual wheelchair that she uses intermittently;     Limitations  Standing;Walking    How long can you sit comfortably?  NA    How long can you stand comfortably?  has pain in LLE with prolonged stooping; <10 min;     How long can you walk comfortably?  200 feet with assistance requiring several breaks;     Diagnostic tests  has had MRI and brain  imaging;     Patient Stated Goals  "Be able to travel more, little sight seeing; walk short distances" Patient reports that she would like to be able to get rid of the walker.     Currently in Pain?  No/denies    Pain Onset  More than a month ago         [facility] PT Assessment - 09/23/18 0001      Strength   Right Hip Flexion  4/5    Right Hip Extension  3-/5    Right Hip ABduction  4/5    Left Hip Flexion  4/5    Left Hip Extension  3-/5    Left Hip ABduction  4/5    Right Knee Flexion  4+/5    Right Knee Extension  4+/5    Left Knee Flexion  4+/5    Left Knee Extension  4+/5    Right Ankle Dorsiflexion  4+/5    Left Ankle Dorsiflexion  4/5      Standardized Balance Assessment    10 Meter Walk  0.67 m/s without AD (limited home ambulator, improved from 0.5 m/s on 08/12/18)      Berg Balance Test   Sit to Stand  Able to stand without using hands and stabilize independently    Standing Unsupported  Able to stand safely 2 minutes    Sitting with Back Unsupported but Feet Supported on Floor or Stool  Able to sit safely and securely 2 minutes    Stand to Sit  Sits safely with minimal use of hands    Transfers  Able to transfer safely, minor use of hands    Standing Unsupported with Eyes Closed  Able to stand 10 seconds safely    Standing Unsupported with Feet Together  Able to place feet together independently and stand 1 minute safely    From Standing, Reach Forward with Outstretched Arm  Can reach forward >12 cm safely (5")    From Standing Position, Pick up Object from Floor  Able to pick up shoe safely and easily    From Standing Position, Turn to Look Behind Over each Shoulder  Looks behind from both sides and weight shifts well    Turn 360 Degrees  Able to turn 360 degrees safely but slowly    Standing Unsupported, Alternately Place Feet on Step/Stool  Able to stand independently and safely and complete 8 steps in 20 seconds    Standing Unsupported, One Foot in Front  Able to plae foot ahead of the other independently and hold 30 seconds    Standing on One Leg  Tries to lift leg/unable to hold 3 seconds but remains standing independently    Total Score  49    Berg comment:  --   low risk for falls, improved from 45/56            TREATMENT: Warm up on Nustep BUE/BLE level 2 x4 min (Unbilled);  NMR: Stepping over hurdles #2,reciprocally x12 laps with 1-0rail assist; patient able to progress to no rail assist;Patient requires close supervision with min VCs to increase step length and improve speed for better reciprocal stepping; Pt hesitant to reduce rail assist due to fear of falling;  Patient exhibits heavy step with LLE due to decreased weight  shift to RLE during stance; PT instructed patient in standing on RLE, LLE hip flexion march x10 reps with cues for weight shift and to avoid falling to LLE for better foot clearance; Patient able  to exhibit better weight shift with increased repetition with less heavy step with LLE;  Gait overground, level surface with cues for weight shift to RLE with less lateral weight shift but cues for forward weight shift with increased gluteal activation x50 feet x2 sets with less wide base of support and improved step length on LLE for reciprocal gait pattern, inconsistent;   Instructed patient in 10 meter walk, Berg Balance Assessment, strength assessment, to address goals, see above;   Response to treatment:Patient continues to demonstrate excellent motivation for therapy.Pt was able to exhibit better base of support with less widened steps and improved step length progressing to intermittent reciprocal pattern. Patient does better with increased repetition with improved motor planning and improved step position. She does exhibit slightly crouched gait with increased knee flexion and decreased ankle DF at heel strike. With cues for better heel strike she was able to exhibit better step through gait pattern with less knee flexion;                        PT Education - 09/23/18 1148    Education Details  progress towards goals, HEP reinforced, weight shift;    Person(s) Educated  Patient    Methods  Explanation;Verbal cues    Comprehension  Verbalized understanding;Returned demonstration;Verbal cues required;Need further instruction       PT Short Term Goals - 09/23/18 1149      PT SHORT TERM GOAL #1   Title  Patient will be adherent to HEP at least 3x a week to improve functional strength and balance for better safety at home.    Baseline  08/12/18- doing exercise daily;    Time  4    Period  Weeks    Status  Achieved    Target Date  08/17/18      PT SHORT TERM GOAL #2    Title   Patient will deny any falls over past 4 weeks to demonstrate improved safety awareness at home and work.     Baseline  08/12/18- fell 1 week ago; 09/23/18: none recent;    Time  4    Period  Weeks    Status  Achieved    Target Date  08/17/18        PT Long Term Goals - 09/23/18 1149      PT LONG TERM GOAL #1   Title  Patient will increase Berg Balance score by > 6 points to demonstrate decreased fall risk during functional activities.    Time  8    Period  Weeks    Status  Achieved      PT LONG TERM GOAL #2   Title  Patient will increase 10 meter walk test to >0.8 m/s as to improve gait speed for better community ambulation and to reduce fall risk.    Time  4    Period  Weeks    Status  Partially Met    Target Date  10/21/18      PT LONG TERM GOAL #3   Title  Patient will be require no assist with ascend/descend 3 steps using Least restrictive assistive device to improve safety with entry/exit home.     Time  4    Period  Weeks    Status  Partially Met    Target Date  10/21/18      PT LONG TERM GOAL #4   Title  Patient will increase BLE gross strength to 4+/5 as  to improve functional strength for independent gait, increased standing tolerance and increased ADL ability.    Time  4    Period  Weeks    Status  Partially Met    Target Date  10/21/18            Plan - 09/23/18 1444    Clinical Impression Statement  Patient motivated and participated well within session. She was able to exhibit improved dynamic balance and gait safety following re-education in weight shift and stance control with RLE stance and LLE hip flexion; Patient does exhibit improved gait speed, strength and balance as compared to previous month, despite missing several sessions from vacation and not feeling well. She would benefit from additional skilled PT Intervention to improve strength, balance and gait safety;    Personal Factors and Comorbidities  Age;Time since onset of  injury/illness/exacerbation;Comorbidity 3+    Comorbidities  HTN (controlled), hydrocephalus somewhat controlled, L5 pars defect;     Examination-Activity Limitations  Bend;Caring for Others;Carry;Continence;Dressing;Lift;Locomotion Level;Stairs;Stand;Transfers    Examination-Participation Restrictions  Cleaning;Community Activity;Driving;Laundry;Meal Prep;Shop;Yard Work    Stability/Clinical Decision Making  Evolving/Moderate complexity    Rehab Potential  Good    PT Frequency  2x / week    PT Duration  4 weeks    PT Treatment/Interventions  Cryotherapy;Electrical Stimulation;Moist Heat;Gait training;Stair training;Functional mobility training;Therapeutic activities;Therapeutic exercise;Balance training;Neuromuscular re-education;Patient/family education;Energy conservation    PT Next Visit Plan  Continue to work on balance activities and strengthening at next visit    PT [place]  Reviewed tandem stance and SLS at [place] with pt at today's visit.    Consulted and Agree with Plan of Care  Patient       Patient will benefit from skilled therapeutic intervention in order to improve the following deficits and impairments:  Abnormal gait, Decreased balance, Decreased endurance, Decreased mobility, Difficulty walking, Impaired perceived functional ability, Decreased activity tolerance, Decreased safety awareness  Visit Diagnosis: 1. Unsteadiness on feet   2. Difficulty in walking, not elsewhere classified   3. Muscle weakness (generalized)        Problem List Patient Active Problem List   Diagnosis Date Noted  . NPH (normal pressure hydrocephalus) ([place]) 07/30/2016  . Left leg weakness 04/20/2016  . Menopausal osteoporosis 03/04/2016  . Abnormal gait 12/18/2015  . Left foot drop 12/18/2015  . BP (high blood pressure) 08/24/2015  . Hydronephrosis, left 07/28/2015  . UPJ (ureteropelvic junction) obstruction 07/28/2015  . UTI (lower urinary tract infection) 07/14/2015   . Hydronephrosis 07/14/2015    Joshlynn Alfonzo PT, DPT 09/23/2018, 2:50 PM  [place] MAIN [facility] SERVICES [address] [place], [place], [address] Phone: [phone]   Fax:  [phone]  Name: Abigail Hill MRN: [id number] Date of Birth: 04-04-1950

## 2018-09-24 ENCOUNTER — Ambulatory Visit (INDEPENDENT_AMBULATORY_CARE_PROVIDER_SITE_OTHER): Payer: PPO | Admitting: Physician Assistant

## 2018-09-24 ENCOUNTER — Other Ambulatory Visit: Payer: Self-pay

## 2018-09-24 ENCOUNTER — Encounter: Payer: Self-pay | Admitting: Physician Assistant

## 2018-09-24 VITALS — BP 162/91 | HR 89 | Ht 62.0 in | Wt 160.0 lb

## 2018-09-24 DIAGNOSIS — R300 Dysuria: Secondary | ICD-10-CM | POA: Diagnosis not present

## 2018-09-24 DIAGNOSIS — N390 Urinary tract infection, site not specified: Secondary | ICD-10-CM | POA: Diagnosis not present

## 2018-09-24 LAB — URINALYSIS, COMPLETE
Bilirubin, UA: NEGATIVE
Glucose, UA: NEGATIVE
Ketones, UA: NEGATIVE
Nitrite, UA: NEGATIVE
Specific Gravity, UA: 1.01 (ref 1.005–1.030)
Urobilinogen, Ur: 0.2 mg/dL (ref 0.2–1.0)
pH, UA: 5 (ref 5.0–7.5)

## 2018-09-24 LAB — MICROSCOPIC EXAMINATION: WBC, UA: >30 /hpf — AB (ref 0–5)

## 2018-09-24 LAB — BLADDER SCAN AMB NON-IMAGING

## 2018-09-24 MED ORDER — SULFAMETHOXAZOLE-TRIMETHOPRIM 400-80 MG PO TABS: 1.0000 | ORAL_TABLET | Freq: Two times a day (BID) | ORAL | 0 refills | Status: DC

## 2018-09-24 NOTE — Progress Notes (Signed)
Addendum:  Patient's urine cultures returned with >100k CFUs E faecalis sensitive to nitrofurantoin and cipro. Concern for inadequate therapy with previously-prescribed Bactrim. Given patient's PMH of chronic UPJ obstruction with hydronephrosis, sent new rx to pharmacy for Cipro 500mg  BID x7 days. Staff to confirm no new symptoms of nausea, vomiting, flank pain, abdominal pain, fevers, or chills since she was in clinic; if she has developed these, clinical suspicion for acute pyelonephritis will be high and will plan to bring her back to clinic to discuss stent.  Debroah Loop, PA-C 09/27/18 8:34 AM    09/24/2018 1:16 PM   Abigail Hill May 14, 1950 [id number]  CC: Milky, bubbly urine; urinary discomfort   HPI: Abigail Hill is a 68 y.o. female who presents today for evaluation of possible UTI. She is an established [facility] patient who last saw Dr. Erlene Quan on 09/07/2018 for surveillance of chronic left UPJ obstruction with severe left hydronephrosis as well as urge incontinence on Myrbetriq.  Patient has elected for conservative management of her chronic UPJ obstruction.  Patient reports milky urine with bubbles since yesterday afternoon starting after PT on her left hip. She denies burning, but states she feels like "something isn't right" when she urinates. She has urinary urgency at baseline and reports no acute exacerbation of this symptom. She denies suprapubic tenderness, nausea, vomiting, and fevers. She took 2 ibuprofen, 1 acetaminophen yesterday with some palliation of her symptoms. She states her current symptoms are consistent with how she has felt with prior UTIs.  Patient was started on Myrbetriq for urge incontinence in May. She notes that so far the medication is "really helping" with her symptoms.   Her most recent UTI was in August 2019 with ampicillin-resistant K pneumoniae. She reports no infections requiring antibiotic therapy in the past three months.  Patient was unable  to void to provide a urine sample today, stating that she didn't feel like she needed to go. CMA obtained approximately 12mL sample via catheterization with PVR of 43mL. Patient denies other episodes of urinary retention. Subsequent UA today was positive for WBCs, positive for RBCs, positive for protein, and positive for leukocytes.  PMH: Past Medical History:  Diagnosis Date  . Abnormal gait 12/18/2015  . BP (high blood pressure) 08/24/2015  . Cancer ([place])    skin  . Constriction of ureter (postoperative) 07/28/2015  . Hydronephrosis 07/14/2015  . Hypertension   . Left foot drop 12/18/2015  . Recurrent UTI   . UPJ (ureteropelvic junction) obstruction 07/28/2015  . UTI (lower urinary tract infection) 07/14/2015    Surgical History: Past Surgical History:  Procedure Laterality Date  . broken foot  2012  . COLONOSCOPY WITH PROPOFOL N/A 06/30/2017   Procedure: COLONOSCOPY WITH PROPOFOL;  Surgeon: Lollie Sails, MD;  Location: [facility] ENDOSCOPY;  Service: Endoscopy;  Laterality: N/A;  . kidney repair  1985   repaired torn blood vessel in kidney    Home Medications:  Allergies as of 09/24/2018      Reactions   Oxycodone       Medication List       Accurate as of September 24, 2018  1:16 PM. If you have any questions, ask your nurse or doctor.        alendronate 70 MG tablet Commonly known as: FOSAMAX   aspirin 81 MG chewable tablet Chew 81 mg by mouth daily.   calcium-vitamin D 250-125 MG-UNIT tablet Commonly known as: OSCAL WITH D Take by mouth.   CALTRATE PLUS PO Take by  mouth.   CENTRUM SILVER PO Take by mouth.   mirabegron ER 25 MG Tb24 tablet Commonly known as: MYRBETRIQ Take 1 tablet (25 mg total) by mouth daily.   nystatin-triamcinolone ointment Commonly known as: MYCOLOG Apply 1 application topically 2 (two) times daily.   sulfamethoxazole-trimethoprim 400-80 MG tablet Commonly known as: BACTRIM Take 1 tablet by mouth 2 (two) times daily for 7 days.  Started by: Debroah Loop, PA-C      Allergies:  Allergies  Allergen Reactions  . Oxycodone    Family History: Family History  Problem Relation Age of Onset  . Kidney disease Neg Hx   . Bladder Cancer Neg Hx    Social History:   reports that she has quit smoking. She has never used smokeless tobacco. She reports that she does not drink alcohol or use drugs.  ROS: UROLOGY Frequent Urination?: No Hard to postpone urination?: Yes Burning/pain with urination?: Yes Get up at night to urinate?: Yes Leakage of urine?: Yes Urine stream starts and stops?: No Trouble starting stream?: No Do you have to strain to urinate?: No Blood in urine?: No Urinary tract infection?: No Sexually transmitted disease?: No Injury to kidneys or bladder?: No Painful intercourse?: No Weak stream?: No Currently pregnant?: No Vaginal bleeding?: No Last menstrual period?: n  Gastrointestinal Nausea?: No Vomiting?: No Indigestion/heartburn?: No Diarrhea?: No Constipation?: No  Constitutional Fever: No Night sweats?: No Weight loss?: No Fatigue?: No  Skin Skin rash/lesions?: No Itching?: No  Eyes Blurred vision?: No Double vision?: No  Ears/Nose/Throat Sore throat?: No Sinus problems?: No  Hematologic/Lymphatic Swollen glands?: No Easy bruising?: No  Cardiovascular Leg swelling?: Yes Chest pain?: No  Respiratory Cough?: No Shortness of breath?: No  Endocrine Excessive thirst?: No  Musculoskeletal Back pain?: No Joint pain?: No  Neurological Headaches?: No Dizziness?: No  Psychologic Depression?: No Anxiety?: No  Physical Exam: BP (!) 162/91 (BP Location: Left Arm, Patient Position: Sitting)   Pulse 89   Ht 5\' 2"  (1.575 m)   Wt 160 lb (72.6 kg)   BMI 29.26 kg/m   Constitutional:  Alert and oriented, No acute distress. Cardiovascular: No clubbing, cyanosis, or edema. Respiratory: Normal respiratory effort, no increased work of breathing. GU: No  CVA tenderness Skin: No rashes, bruises or suspicious lesions. Neurologic: Grossly intact, no focal deficits, moving all 4 extremities. Psychiatric: Normal mood and affect.  Laboratory Data: Urinalysis    Component Value Date/Time   COLORURINE YELLOW (A) 06/13/2015 0221   APPEARANCEUR Cloudy (A) 09/24/2018 1049   LABSPEC 1.021 06/13/2015 0221   PHURINE 5.0 06/13/2015 0221   GLUCOSEU Negative 09/24/2018 1049   HGBUR 1+ (A) 06/13/2015 0221   BILIRUBINUR Negative 09/24/2018 1049   KETONESUR TRACE (A) 06/13/2015 0221   PROTEINUR 1+ (A) 09/24/2018 1049   PROTEINUR 30 (A) 06/13/2015 0221   NITRITE Negative 09/24/2018 1049   NITRITE POSITIVE (A) 06/13/2015 0221   LEUKOCYTESUR 3+ (A) 09/24/2018 1049    Results for orders placed or performed in visit on 09/24/18  CULTURE, URINE COMPREHENSIVE     Status: Abnormal   Collection Time: 09/24/18 10:49 AM   Specimen: Urine   CU  Result Value Ref Range Status   Urine Culture, Comprehensive Final report (A)  Final   Organism ID, Bacteria Enterococcus faecalis (A)  Final    Comment: Greater than 100,000 colony forming units per mL   ANTIMICROBIAL SUSCEPTIBILITY Comment  Final    Comment:       ** S = Susceptible; I =  Intermediate; R = Resistant **                    P = Positive; N = Negative             MICS are expressed in micrograms per mL    Antibiotic                 RSLT#1    RSLT#2    RSLT#3    RSLT#4 Ciprofloxacin                  S Levofloxacin                   S Nitrofurantoin                 S Penicillin                     S Tetracycline                   R Vancomycin                     S   Microscopic Examination     Status: Abnormal   Collection Time: 09/24/18 10:49 AM   URINE  Result Value Ref Range Status   WBC, UA >30 (A) 0 - 5 /hpf Final   RBC 3-10 (A) 0 - 2 /hpf Final   Epithelial Cells (non renal) 0-10 0 - 10 /hpf Final   Crystals Present (A) N/A Final   Crystal Type Amorphous Sediment N/A Final   Bacteria,  UA Few None seen/Few Final   Pertinent Imaging: PVR 48mL  Assessment & Plan:   1. Acute cystitis, complicated UA in office today concerning for acute urinary tract infection. PMH of chronic UPJ obstruction with hydronephrosis increases her risk in this setting; will prolong antibiotic therapy accordingly. No CVA tenderness on physical exam and no reported fevers, chills, nausea, and vomiting. Low concern for ascending UTI at this time.  I counseled the patient that I would start her on Bactrim today and call her with her urine culture results, adjusted therapy if indicated. She expressed an understanding of this plan.  I also informed her that this infection may impact her future imaging plans with Dr. Erlene Quan, who states in her 09/07/2018 progress note that further infection may necessitate surveillance renal imaging sooner than 2022.  -Urinalysis, Complete -Urine culture -Bactrim DS BID x7 days sent to pharmacy  2. Urge incontinence Patient has been on Myrbetriq for approximately 3 months at this point and reports improvement in urinary symptoms. She states she has not had difficulty urinating until today's visit. I counseled her that Myrbetriq may increase her risk of urinary retention. I am comfortable keeping her on the Myrbetriq for now given this isolated instance of difficulty voiding, however I did counsel her that if this symptom persists she must inform us ASAP. May choose to d/c med at that time.  Debroah Loop, PA-C  [facility] [address] [place], [place] [address] [phone]

## 2018-09-26 LAB — CULTURE, URINE COMPREHENSIVE

## 2018-09-27 ENCOUNTER — Other Ambulatory Visit: Payer: Self-pay | Admitting: Physician Assistant

## 2018-09-27 ENCOUNTER — Telehealth: Payer: Self-pay | Admitting: Physician Assistant

## 2018-09-27 MED ORDER — CIPROFLOXACIN HCL 500 MG PO TABS: 500.0000 mg | ORAL_TABLET | Freq: Two times a day (BID) | ORAL | 0 refills | Status: AC

## 2018-09-27 NOTE — Telephone Encounter (Addendum)
Informed patient-verbalized understanding. She is aware to take Cipro and discontinue Bactrim. Denies pain, fevers, chills, body aches, or nausea. She feels fatigued periodically.

## 2018-09-27 NOTE — Telephone Encounter (Addendum)
Please contact the patient and inform her that her urine culture came back suggesting that a different antibiotic would be better to clear her infection. I have sent an rx for Cipro 500 BID x7 days to her pharmacy; she should discontinue the Bactrim today and start this new antibiotic instead.  Please confirm with her whether or not she has developed any new fevers, chills, abdominal pain, flank pain, nausea, or vomiting since seeing me last week. If so, we may need to bring her back in and discuss a stent.

## 2018-09-28 ENCOUNTER — Encounter: Payer: Self-pay | Admitting: Physical Therapy

## 2018-09-28 ENCOUNTER — Ambulatory Visit: Payer: PPO | Admitting: Physical Therapy

## 2018-09-28 ENCOUNTER — Other Ambulatory Visit: Payer: Self-pay

## 2018-09-28 DIAGNOSIS — R269 Unspecified abnormalities of gait and mobility: Secondary | ICD-10-CM | POA: Diagnosis not present

## 2018-09-28 DIAGNOSIS — R262 Difficulty in walking, not elsewhere classified: Secondary | ICD-10-CM

## 2018-09-28 DIAGNOSIS — M6281 Muscle weakness (generalized): Secondary | ICD-10-CM

## 2018-09-28 DIAGNOSIS — R2681 Unsteadiness on feet: Secondary | ICD-10-CM

## 2018-09-28 NOTE — Therapy (Signed)
[place] MAIN [facility] SERVICES [address] [place], [place], [address] Phone: [phone]   Fax:  [phone]  Physical Therapy Treatment  Patient Details  Name: Abigail Hill MRN: [id number] Date of Birth: 17-Jun-1950 Referring Provider (PT): Dr. Metta Clines   Encounter Date: 09/28/2018  PT End of Session - 09/28/18 1149    Visit Number  18    Number of Visits  30    Date for PT Re-Evaluation  10/21/18    Authorization Type  goals addressed 08/12/18,    PT Start Time  1146    PT Stop Time  1230    PT Time Calculation (min)  44 min    Equipment Utilized During Treatment  Gait belt    Activity Tolerance  Patient limited by fatigue;Patient tolerated treatment well    Behavior During Therapy  [facility] for tasks assessed/performed       Past Medical History:  Diagnosis Date  . Abnormal gait 12/18/2015  . BP (high blood pressure) 08/24/2015  . Cancer ([place])    skin  . Constriction of ureter (postoperative) 07/28/2015  . Hydronephrosis 07/14/2015  . Hypertension   . Left foot drop 12/18/2015  . Recurrent UTI   . UPJ (ureteropelvic junction) obstruction 07/28/2015  . UTI (lower urinary tract infection) 07/14/2015    Past Surgical History:  Procedure Laterality Date  . broken foot  2012  . COLONOSCOPY WITH PROPOFOL N/A 06/30/2017   Procedure: COLONOSCOPY WITH PROPOFOL;  Surgeon: Lollie Sails, MD;  Location: [facility] ENDOSCOPY;  Service: Endoscopy;  Laterality: N/A;  . kidney repair  1985   repaired torn blood vessel in kidney    There were no vitals filed for this visit.  Subjective Assessment - 09/28/18 1153    Subjective  Patient reports being diagnosed with UTI and has been prescribed antibiotic. She reports no side effects from the medication; She denies any falls; Denies any discomfort;    Pertinent History  68 yo Female reports instability with gait with difficulty walking. She states, "my left leg gives me trouble. It will  just plant and  then I can't pick it up." Pt reports 2 falls in last month. She reports when she falls it is often falling backwards; She was diagnosed with hydrocephalus and did a drain trial with no relief; She did not qualify for a shunt. Repeat imaging showed continued hydrocephalus that has not worsened; MRI of lumbar spine shows L5 pars defect with sacral insufficiency; She denies any back pain and reports no numbness in legs. She reports feeling frustration because they can't seem to find what's wrong with her. She does have urinary incontience; reports knowing when she has to go to the bathroom but can't get there fast enough; Patient has a walker and rollator; She uses both; She also has a manual wheelchair that she uses intermittently;     Limitations  Standing;Walking    How long can you sit comfortably?  NA    How long can you stand comfortably?  has pain in LLE with prolonged stooping; <10 min;     How long can you walk comfortably?  200 feet with assistance requiring several breaks;     Diagnostic tests  has had MRI and brain imaging;     Patient Stated Goals  "Be able to travel more, little sight seeing; walk short distances" Patient reports that she would like to be able to get rid of the walker.     Currently in  Pain?  No/denies    Pain Onset  More than a month ago         TREATMENT: Patient hooklying: Lumbar trunk rotation x1 min with cues to avoid painful ROM and to slow down ROM for better flexibility;  Patient prone: Instructed patient in alternate hamstring curl AROM x10 reps bilaterally; Passive quad stretch 20 sec hold x2 reps bilaterally with towel roll under knee for better iliopsoas stretch; Instructed patient in hip extension SLR x15 reps bilaterally; Instructed patient in gluteal max hip extension (knee flexed) x15 reps bilaterally; Patient required min-moderate verbal/tactile cues for correct exercise technique for optimum muscle activation and strengthening; Patient required  AAROM to achieve better ROM as patient very weak in hip extension;    Long sitting hamstring stretch 20 sec hold x2 reps bilaterally with min VCs to keep knee straight for better stretch;  Standing outside parallel bars to challenge dynamic balance:    NMR: Stepping over hurdles #2,reciprocally x10 laps with 1-0rail assist; patient able to progress to no rail assist;Patient requires close supervision with min VCs to increase step length and improve speed for better reciprocal stepping; Pt hesitant to reduce rail assist due to fear of falling;  Patient exhibits heavy step with LLE due to decreased weight shift to RLE during stance; PT instructed patient in standing on RLE, LLE hip flexion march x10 reps with cues for weight shift and to avoid falling to LLE for better foot clearance; Patient able to exhibit better weight shift with increased repetition with less heavy step with LLE;  Progressed to LLE toe taps to red cone with RLE stance x10 reps with min A and mod VCs for sequencing and weight shift; Patient had increased difficulty due to fear of falling;   Gait overground, level surface x80 feet with supervision with patient exhibiting improved LLE heel strike and improved reciprocal gait pattern with less left foot drag;   Response to treatment:Patient continues to demonstrate excellent motivation for therapy.Pt was able to exhibit better base of support with less widened steps and improved step length progressing to reciprocal pattern. Patient does better with increased repetition with improved motor planning and improved step position. She does fatigue at end of session;                     PT Education - 09/28/18 1149    Education Details  LE strengthening, balance/gait safety; HEP reinforced;    Person(s) Educated  Patient    Methods  Explanation;Verbal cues    Comprehension  Verbalized understanding;Returned demonstration;Verbal cues required;Need  further instruction       PT Short Term Goals - 09/23/18 1149      PT SHORT TERM GOAL #1   Title  Patient will be adherent to HEP at least 3x a week to improve functional strength and balance for better safety at home.    Baseline  08/12/18- doing exercise daily;    Time  4    Period  Weeks    Status  Achieved    Target Date  08/17/18      PT SHORT TERM GOAL #2   Title   Patient will deny any falls over past 4 weeks to demonstrate improved safety awareness at home and work.     Baseline  08/12/18- fell 1 week ago; 09/23/18: none recent;    Time  4    Period  Weeks    Status  Achieved    Target Date  08/17/18  PT Long Term Goals - 09/23/18 1149      PT LONG TERM GOAL #1   Title  Patient will increase Berg Balance score by > 6 points to demonstrate decreased fall risk during functional activities.    Time  8    Period  Weeks    Status  Achieved      PT LONG TERM GOAL #2   Title  Patient will increase 10 meter walk test to >0.8 m/s as to improve gait speed for better community ambulation and to reduce fall risk.    Time  4    Period  Weeks    Status  Partially Met    Target Date  10/21/18      PT LONG TERM GOAL #3   Title  Patient will be require no assist with ascend/descend 3 steps using Least restrictive assistive device to improve safety with entry/exit home.     Time  4    Period  Weeks    Status  Partially Met    Target Date  10/21/18      PT LONG TERM GOAL #4   Title  Patient will increase BLE gross strength to 4+/5 as to improve functional strength for independent gait, increased standing tolerance and increased ADL ability.    Time  4    Period  Weeks    Status  Partially Met    Target Date  10/21/18            Plan - 09/28/18 1243    Clinical Impression Statement  Patient motivated and particpated well within session. She does exhibit significant weakness having difficulty achieving full ROM against gravity with prone exercise. Patient able to  exhibit better weight shift and hip flexibility following prone exercise. Advanced dynamic balance progressing to outside parallel bars for less rail assist. She was hesitant to shift weight to RLE without rail assist. Patient does require increased cues for proper weight shift and sequencing today. She was able to exhibit better heel strike and less left foot drag at end of session. Patient would benefit from additional skilled PT Intervention to improve strength, balance and gait safety;    Personal Factors and Comorbidities  Age;Time since onset of injury/illness/exacerbation;Comorbidity 3+    Comorbidities  HTN (controlled), hydrocephalus somewhat controlled, L5 pars defect;     Examination-Activity Limitations  Bend;Caring for Others;Carry;Continence;Dressing;Lift;Locomotion Level;Stairs;Stand;Transfers    Examination-Participation Restrictions  Cleaning;Community Activity;Driving;Laundry;Meal Prep;Shop;Yard Work    Stability/Clinical Decision Making  Evolving/Moderate complexity    Rehab Potential  Good    PT Frequency  2x / week    PT Duration  4 weeks    PT Treatment/Interventions  Cryotherapy;Electrical Stimulation;Moist Heat;Gait training;Stair training;Functional mobility training;Therapeutic activities;Therapeutic exercise;Balance training;Neuromuscular re-education;Patient/family education;Energy conservation    PT Next Visit Plan  Continue to work on balance activities and strengthening at next visit    PT Divide  Reviewed tandem stance and SLS at [place] with pt at today's visit.    Consulted and Agree with Plan of Care  Patient       Patient will benefit from skilled therapeutic intervention in order to improve the following deficits and impairments:  Abnormal gait, Decreased balance, Decreased endurance, Decreased mobility, Difficulty walking, Impaired perceived functional ability, Decreased activity tolerance, Decreased safety awareness  Visit Diagnosis: 1.  Unsteadiness on feet   2. Difficulty in walking, not elsewhere classified   3. Muscle weakness (generalized)        Problem List Patient Active  Problem List   Diagnosis Date Noted  . NPH (normal pressure hydrocephalus) ([place]) 07/30/2016  . Left leg weakness 04/20/2016  . Menopausal osteoporosis 03/04/2016  . Abnormal gait 12/18/2015  . Left foot drop 12/18/2015  . BP (high blood pressure) 08/24/2015  . Hydronephrosis, left 07/28/2015  . UPJ (ureteropelvic junction) obstruction 07/28/2015  . Lower urinary tract infectious disease 07/14/2015  . Hydronephrosis 07/14/2015    , PT, DPT 09/28/2018, 12:46 PM  [place] MAIN [facility] SERVICES [address] [place], [place], [address] Phone: [phone]   Fax:  [phone]  Name: Abigail Hill MRN: [id number] Date of Birth: October 05, 1950

## 2018-09-29 DIAGNOSIS — N390 Urinary tract infection, site not specified: Secondary | ICD-10-CM | POA: Diagnosis not present

## 2018-09-29 DIAGNOSIS — I10 Essential (primary) hypertension: Secondary | ICD-10-CM | POA: Diagnosis not present

## 2018-09-29 DIAGNOSIS — M810 Age-related osteoporosis without current pathological fracture: Secondary | ICD-10-CM | POA: Diagnosis not present

## 2018-09-29 DIAGNOSIS — I639 Cerebral infarction, unspecified: Secondary | ICD-10-CM | POA: Diagnosis not present

## 2018-09-29 DIAGNOSIS — G912 (Idiopathic) normal pressure hydrocephalus: Secondary | ICD-10-CM | POA: Diagnosis not present

## 2018-09-30 ENCOUNTER — Encounter: Payer: Self-pay | Admitting: Physical Therapy

## 2018-09-30 ENCOUNTER — Other Ambulatory Visit: Payer: Self-pay

## 2018-09-30 ENCOUNTER — Ambulatory Visit: Payer: PPO | Admitting: Physical Therapy

## 2018-09-30 DIAGNOSIS — R269 Unspecified abnormalities of gait and mobility: Secondary | ICD-10-CM | POA: Diagnosis not present

## 2018-09-30 DIAGNOSIS — R262 Difficulty in walking, not elsewhere classified: Secondary | ICD-10-CM

## 2018-09-30 DIAGNOSIS — R2681 Unsteadiness on feet: Secondary | ICD-10-CM

## 2018-09-30 DIAGNOSIS — M6281 Muscle weakness (generalized): Secondary | ICD-10-CM

## 2018-09-30 NOTE — Therapy (Signed)
[place] MAIN [facility] SERVICES [address] [place], [place], [address] Phone: [phone]   Fax:  [phone]  Physical Therapy Treatment  Patient Details  Name: Abigail Hill MRN: [id number] Date of Birth: Apr 14, 1950 Referring Provider (PT): Dr. Metta Clines   Encounter Date: 09/30/2018  PT End of Session - 09/30/18 1146    Visit Number  19    Number of Visits  30    Date for PT Re-Evaluation  10/21/18    Authorization Type  goals addressed 08/12/18,    PT Start Time  1142    PT Stop Time  1225    PT Time Calculation (min)  43 min    Equipment Utilized During Treatment  Gait belt    Activity Tolerance  Patient limited by fatigue;Patient tolerated treatment well    Behavior During Therapy  [facility] for tasks assessed/performed       Past Medical History:  Diagnosis Date  . Abnormal gait 12/18/2015  . BP (high blood pressure) 08/24/2015  . Cancer ([place])    skin  . Constriction of ureter (postoperative) 07/28/2015  . Hydronephrosis 07/14/2015  . Hypertension   . Left foot drop 12/18/2015  . Recurrent UTI   . UPJ (ureteropelvic junction) obstruction 07/28/2015  . UTI (lower urinary tract infection) 07/14/2015    Past Surgical History:  Procedure Laterality Date  . broken foot  2012  . COLONOSCOPY WITH PROPOFOL N/A 06/30/2017   Procedure: COLONOSCOPY WITH PROPOFOL;  Surgeon: Lollie Sails, MD;  Location: [facility] ENDOSCOPY;  Service: Endoscopy;  Laterality: N/A;  . kidney repair  1985   repaired torn blood vessel in kidney    There were no vitals filed for this visit.  Subjective Assessment - 09/30/18 1146    Subjective  Patient reports feeling well; She reports she has been getting around better at home but does get confused when she is out and about in the community. Denies any new falls;    Pertinent History  68 yo Female reports instability with gait with difficulty walking. She states, "my left leg gives me trouble. It will  just plant  and then I can't pick it up." Pt reports 2 falls in last month. She reports when she falls it is often falling backwards; She was diagnosed with hydrocephalus and did a drain trial with no relief; She did not qualify for a shunt. Repeat imaging showed continued hydrocephalus that has not worsened; MRI of lumbar spine shows L5 pars defect with sacral insufficiency; She denies any back pain and reports no numbness in legs. She reports feeling frustration because they can't seem to find what's wrong with her. She does have urinary incontience; reports knowing when she has to go to the bathroom but can't get there fast enough; Patient has a walker and rollator; She uses both; She also has a manual wheelchair that she uses intermittently;     Limitations  Standing;Walking    How long can you sit comfortably?  NA    How long can you stand comfortably?  has pain in LLE with prolonged stooping; <10 min;     How long can you walk comfortably?  200 feet with assistance requiring several breaks;     Diagnostic tests  has had MRI and brain imaging;     Patient Stated Goals  "Be able to travel more, little sight seeing; walk short distances" Patient reports that she would like to be able to get rid of the walker.  Currently in Pain?  No/denies    Pain Onset  More than a month ago    Multiple Pain Sites  No          TREATMENT:  Patient prone: Instructed patient in alternate hamstring curl AROM x15 reps bilaterally; Passive quad stretch 20 sec hold x2 reps bilaterally with towel roll under knee for better iliopsoas stretch; Instructed patient in hip extension SLR 2x10 reps bilaterally; Instructed patient in gluteal max hip extension (knee flexed)2 x15 reps bilaterally; Patient required min-moderate verbal/tactile cues for correct exercise technique for optimum muscle activation and strengthening; Patient required AAROM to achieve better ROM as patient very weak in hip extension; She exhibits decreased  ROM on RLE but that improved with increased repetition;   Standing heel off step calf stretch 30 sec hold x2 reps bilaterally;  Standing outside parallel bars to challenge dynamic balance:    NMR: Stepping over hurdles #2,reciprocally x10 laps with 1-0rail assist; patient able to progress to no rail assist;Patient requires close supervision with min VCs to increase step length and improve speed for better reciprocal stepping; Pt hesitant to reduce rail assist due to fear of falling;  Patient exhibits heavy step with LLE due to decreased weight shift to RLE during stance; PT instructed patient in standing on RLE, LLE hip flexion march x15 reps with cues for weight shift and to avoid falling to LLE for better foot clearance; Patient able to exhibit better weight shift with increased repetition with less heavy step with LLE;  Gait overground, level surface x80 feet with supervision with patient exhibiting improved LLE heel strike and improved reciprocal gait pattern with less left foot drag;   4 square stepping clockwise/counterclockwise x3 sets each, unsupported with cues for sequencing and foot placement; Progressed to 4 square multiple directional stepping x3-4 min with min A and mod VCs for sequencing, foot placement and to improve step length; able to exhibit better step fluency and length with increased repetition;   Resisted walking 12. 5# forward/backward x2 laps with min A for safety; Pt exhibits decreased step length with backwards walking requiring increased assistance and cues for better step length;   Advanced HEP with standing lateral weight shifts to improve foot clearance with gait;   Response to treatment:Patient continues to demonstrate excellent motivation for therapy.Pt was able to exhibit better base of support with less widened steps and improved step length progressing to reciprocal pattern. Patient does better with increased repetition with improved motor  planning and improved step position. She does fatigue at end of session;                             PT Education - 09/30/18 1146    Education Details  LE strengthening, balance/gait safety; HEP reinforced;    Person(s) Educated  Patient    Methods  Explanation;Verbal cues    Comprehension  Verbalized understanding;Returned demonstration;Verbal cues required;Need further instruction       PT Short Term Goals - 09/23/18 1149      PT SHORT TERM GOAL #1   Title  Patient will be adherent to HEP at least 3x a week to improve functional strength and balance for better safety at home.    Baseline  08/12/18- doing exercise daily;    Time  4    Period  Weeks    Status  Achieved    Target Date  08/17/18      PT SHORT TERM  GOAL #2   Title   Patient will deny any falls over past 4 weeks to demonstrate improved safety awareness at home and work.     Baseline  08/12/18- fell 1 week ago; 09/23/18: none recent;    Time  4    Period  Weeks    Status  Achieved    Target Date  08/17/18        PT Long Term Goals - 09/23/18 1149      PT LONG TERM GOAL #1   Title  Patient will increase Berg Balance score by > 6 points to demonstrate decreased fall risk during functional activities.    Time  8    Period  Weeks    Status  Achieved      PT LONG TERM GOAL #2   Title  Patient will increase 10 meter walk test to >0.8 m/s as to improve gait speed for better community ambulation and to reduce fall risk.    Time  4    Period  Weeks    Status  Partially Met    Target Date  10/21/18      PT LONG TERM GOAL #3   Title  Patient will be require no assist with ascend/descend 3 steps using Least restrictive assistive device to improve safety with entry/exit home.     Time  4    Period  Weeks    Status  Partially Met    Target Date  10/21/18      PT LONG TERM GOAL #4   Title  Patient will increase BLE gross strength to 4+/5 as to improve functional strength for independent  gait, increased standing tolerance and increased ADL ability.    Time  4    Period  Weeks    Status  Partially Met    Target Date  10/21/18            Plan - 09/30/18 1247    Clinical Impression Statement  Patient motivated and participated well within session. She continues to have extensive weakness in BLE particularly in posterior muscle groups making hip extension difficult. Patient instructed in advanced dynamic balance tasks. She did require increased cues for better weight shift and to improve step length. She did exhibit significant difficulty and fear with backwards walking. She would benefit from additional skilled PT Intervention to improve strength, balance and gait safety;    Personal Factors and Comorbidities  Age;Time since onset of injury/illness/exacerbation;Comorbidity 3+    Comorbidities  HTN (controlled), hydrocephalus somewhat controlled, L5 pars defect;     Examination-Activity Limitations  Bend;Caring for Others;Carry;Continence;Dressing;Lift;Locomotion Level;Stairs;Stand;Transfers    Examination-Participation Restrictions  Cleaning;Community Activity;Driving;Laundry;Meal Prep;Shop;Yard Work    Stability/Clinical Decision Making  Evolving/Moderate complexity    Rehab Potential  Good    PT Frequency  2x / week    PT Duration  4 weeks    PT Treatment/Interventions  Cryotherapy;Electrical Stimulation;Moist Heat;Gait training;Stair training;Functional mobility training;Therapeutic activities;Therapeutic exercise;Balance training;Neuromuscular re-education;Patient/family education;Energy conservation    PT Next Visit Plan  Continue to work on balance activities and strengthening at next visit    PT [place]  Reviewed tandem stance and SLS at [place] with pt at today's visit.    Consulted and Agree with Plan of Care  Patient       Patient will benefit from skilled therapeutic intervention in order to improve the following deficits and impairments:   Abnormal gait, Decreased balance, Decreased endurance, Decreased mobility, Difficulty walking, Impaired perceived functional ability,  Decreased activity tolerance, Decreased safety awareness  Visit Diagnosis: 1. Unsteadiness on feet   2. Difficulty in walking, not elsewhere classified   3. Muscle weakness (generalized)        Problem List Patient Active Problem List   Diagnosis Date Noted  . NPH (normal pressure hydrocephalus) ([place]) 07/30/2016  . Left leg weakness 04/20/2016  . Menopausal osteoporosis 03/04/2016  . Abnormal gait 12/18/2015  . Left foot drop 12/18/2015  . BP (high blood pressure) 08/24/2015  . Hydronephrosis, left 07/28/2015  . UPJ (ureteropelvic junction) obstruction 07/28/2015  . Lower urinary tract infectious disease 07/14/2015  . Hydronephrosis 07/14/2015    Jeaninne Lodico PT, DPT 09/30/2018, 12:49 PM  [place] MAIN [facility] SERVICES [address] [place], [place], [address] Phone: [phone]   Fax:  [phone]  Name: Abigail Hill MRN: [id number] Date of Birth: 05-Feb-1951

## 2018-10-05 ENCOUNTER — Ambulatory Visit: Payer: PPO | Attending: Neurology | Admitting: Physical Therapy

## 2018-10-05 ENCOUNTER — Encounter: Payer: Self-pay | Admitting: Physical Therapy

## 2018-10-05 ENCOUNTER — Other Ambulatory Visit: Payer: Self-pay

## 2018-10-05 DIAGNOSIS — M6281 Muscle weakness (generalized): Secondary | ICD-10-CM

## 2018-10-05 DIAGNOSIS — R269 Unspecified abnormalities of gait and mobility: Secondary | ICD-10-CM | POA: Diagnosis not present

## 2018-10-05 DIAGNOSIS — R262 Difficulty in walking, not elsewhere classified: Secondary | ICD-10-CM | POA: Diagnosis not present

## 2018-10-05 DIAGNOSIS — R29898 Other symptoms and signs involving the musculoskeletal system: Secondary | ICD-10-CM | POA: Diagnosis not present

## 2018-10-05 DIAGNOSIS — R2681 Unsteadiness on feet: Secondary | ICD-10-CM | POA: Insufficient documentation

## 2018-10-05 NOTE — Therapy (Signed)
[place] MAIN [facility] SERVICES [address] [place], [place], [address] Phone: [phone]   Fax:  [phone]  Physical Therapy Treatment Physical Therapy Progress Note   Dates of reporting period  08/12/18   to  10/05/18   Patient Details  Name: Abigail Hill MRN: [id number] Date of Birth: August 26, 1950 Referring Provider (PT): Dr. Metta Clines   Encounter Date: 10/05/2018  PT End of Session - 10/05/18 1206    Visit Number  20    Number of Visits  30    Date for PT Re-Evaluation  10/21/18    Authorization Type  goals addressed 09/23/18,    PT Start Time  1118    PT Stop Time  1200    PT Time Calculation (min)  42 min    Equipment Utilized During Treatment  Gait belt    Activity Tolerance  Patient limited by fatigue;Patient tolerated treatment well    Behavior During Therapy  [facility] for tasks assessed/performed       Past Medical History:  Diagnosis Date  . Abnormal gait 12/18/2015  . BP (high blood pressure) 08/24/2015  . Cancer ([place])    skin  . Constriction of ureter (postoperative) 07/28/2015  . Hydronephrosis 07/14/2015  . Hypertension   . Left foot drop 12/18/2015  . Recurrent UTI   . UPJ (ureteropelvic junction) obstruction 07/28/2015  . UTI (lower urinary tract infection) 07/14/2015    Past Surgical History:  Procedure Laterality Date  . broken foot  2012  . COLONOSCOPY WITH PROPOFOL N/A 06/30/2017   Procedure: COLONOSCOPY WITH PROPOFOL;  Surgeon: Lollie Sails, MD;  Location: [facility] ENDOSCOPY;  Service: Endoscopy;  Laterality: N/A;  . kidney repair  1985   repaired torn blood vessel in kidney    There were no vitals filed for this visit.  Subjective Assessment - 10/05/18 1205    Subjective  Patient reports doing well; She reports no new falls; Reports adherence with HEP; She states that her husband has commented that he can see she is walking some better.    Pertinent History  68 yo Female reports instability with gait with  difficulty walking. She states, "my left leg gives me trouble. It will  just plant and then I can't pick it up." Pt reports 2 falls in last month. She reports when she falls it is often falling backwards; She was diagnosed with hydrocephalus and did a drain trial with no relief; She did not qualify for a shunt. Repeat imaging showed continued hydrocephalus that has not worsened; MRI of lumbar spine shows L5 pars defect with sacral insufficiency; She denies any back pain and reports no numbness in legs. She reports feeling frustration because they can't seem to find what's wrong with her. She does have urinary incontience; reports knowing when she has to go to the bathroom but can't get there fast enough; Patient has a walker and rollator; She uses both; She also has a manual wheelchair that she uses intermittently;     Limitations  Standing;Walking    How long can you sit comfortably?  NA    How long can you stand comfortably?  has pain in LLE with prolonged stooping; <10 min;     How long can you walk comfortably?  200 feet with assistance requiring several breaks;     Diagnostic tests  has had MRI and brain imaging;     Patient Stated Goals  "Be able to travel more, little sight seeing; walk short distances"  Patient reports that she would like to be able to get rid of the walker.     Currently in Pain?  No/denies    Pain Onset  More than a month ago    Multiple Pain Sites  No               TREATMENT:  Patient prone: Instructed patient in alternate hamstring curl AROM x15 reps bilaterally; Passive quad stretch 20 sec hold x2 reps bilaterally with towel roll under knee for better iliopsoas stretch; Instructed patient in hip extension SLR, 2#,  2x10 reps bilaterally; Instructed patient in gluteal max hip extension (knee flexed)2# x15 reps bilaterally; Patient required min-moderate verbal/tactile cues for correct exercise techniquefor optimum muscle activation and strengthening;Patient  required AAROM to achieve better ROM as patient very weak in hip extension; She exhibits decreased ROM on RLE but that improved with increased repetition;   Standing heel off step calf stretch 30 sec hold x2 reps bilaterally;  Standing outside parallel bars to challenge dynamic balance:  NMR: Stepping over hurdles #2,reciprocally x6laps with [place] assist;Patient requires close supervision with min VCs to increase step length and improve speed for better reciprocal stepping; Pt hesitant requiring increased time to initiate stepping over but was able to exhibit improved dynamic balance control with less falling to side and less instability without rail assist;    PT instructed patient in standing on RLE, LLE hip flexion march x15 reps with cues for weight shift and to avoid falling to LLE for better foot clearance; Patient able to exhibit better weight shift with increased repetition with less heavy step with LLE;  Gait overground, level surfacex80 feet with supervision with patient exhibiting improved LLE heel strike and improved reciprocal gait pattern with less left foot drag;  4 square stepping clockwise x3 sets each, unsupported with cues for sequencing and foot placement; able to exhibit improved dynamic balance control, but does have decreased step length when stepping laterally or posteriorly;   Resisted walking 12. 5# forward/backward, side step x2 laps with min A for safety; Pt exhibits better step length but does require cues for weight shift for improved dynamic balance control; She had increased difficulty with eccentric return requiring min A to avoid loss of balance; She was able to progress this session with increased repetition and improved step length;    Response to treatment:Patient continues to demonstrate excellent motivation for therapy.Pt was able to exhibit better base of support with less widened steps and improved step length progressing to  reciprocal pattern. Patient does better with increased repetition with improved motor planning and improved step position. She does fatigue at end of session;Goals were recently addressed on 09/23/18, please refer to recent progress note. Patient's condition has the potential to improve in response to therapy. Maximum improvement is yet to be obtained. The anticipated improvement is attainable and reasonable in a generally predictable time.  Patient reports improved gait ability at home but still feels unsteady from time to time.                      PT Education - 10/05/18 1205    Education Details  LE strengthening, balance/gait safety; HEP reinforced;    Person(s) Educated  Patient    Methods  Explanation;Verbal cues    Comprehension  Verbalized understanding;Returned demonstration;Verbal cues required;Need further instruction       PT Short Term Goals - 09/23/18 1149      PT SHORT TERM GOAL #1  Title  Patient will be adherent to HEP at least 3x a week to improve functional strength and balance for better safety at home.    Baseline  08/12/18- doing exercise daily;    Time  4    Period  Weeks    Status  Achieved    Target Date  08/17/18      PT SHORT TERM GOAL #2   Title   Patient will deny any falls over past 4 weeks to demonstrate improved safety awareness at home and work.     Baseline  08/12/18- fell 1 week ago; 09/23/18: none recent;    Time  4    Period  Weeks    Status  Achieved    Target Date  08/17/18        PT Long Term Goals - 09/23/18 1149      PT LONG TERM GOAL #1   Title  Patient will increase Berg Balance score by > 6 points to demonstrate decreased fall risk during functional activities.    Time  8    Period  Weeks    Status  Achieved      PT LONG TERM GOAL #2   Title  Patient will increase 10 meter walk test to >0.8 m/s as to improve gait speed for better community ambulation and to reduce fall risk.    Time  4    Period  Weeks     Status  Partially Met    Target Date  10/21/18      PT LONG TERM GOAL #3   Title  Patient will be require no assist with ascend/descend 3 steps using Least restrictive assistive device to improve safety with entry/exit home.     Time  4    Period  Weeks    Status  Partially Met    Target Date  10/21/18      PT LONG TERM GOAL #4   Title  Patient will increase BLE gross strength to 4+/5 as to improve functional strength for independent gait, increased standing tolerance and increased ADL ability.    Time  4    Period  Weeks    Status  Partially Met    Target Date  10/21/18            Plan - 10/05/18 1206    Clinical Impression Statement  Patient motivated and participated well within session. She is progressing with LE strength being able to tolerate increased resistance with prone exercise. Pt is able to exhibit improved AROM with increased strength. She does continue to require AAROM with gluteal max hip extension. patient instructed in advanced balance/gait safety; She is able to exhibit better weight shift and improved control of LLE movement. She also exhibited better control with resisted walking requiring less assistance. She would benefit from additional skilled PT intervention to improve strength, balance and gait safety;    Personal Factors and Comorbidities  Age;Time since onset of injury/illness/exacerbation;Comorbidity 3+    Comorbidities  HTN (controlled), hydrocephalus somewhat controlled, L5 pars defect;     Examination-Activity Limitations  Bend;Caring for Others;Carry;Continence;Dressing;Lift;Locomotion Level;Stairs;Stand;Transfers    Examination-Participation Restrictions  Cleaning;Community Activity;Driving;Laundry;Meal Prep;Shop;Yard Work    Stability/Clinical Decision Making  Evolving/Moderate complexity    Rehab Potential  Good    PT Frequency  2x / week    PT Duration  4 weeks    PT Treatment/Interventions  Cryotherapy;Electrical Stimulation;Moist Heat;Gait  training;Stair training;Functional mobility training;Therapeutic activities;Therapeutic exercise;Balance training;Neuromuscular re-education;Patient/family education;Energy conservation    PT  Next Visit Plan  Continue to work on balance activities and strengthening at next visit    PT [place]  Reviewed tandem stance and SLS at [place] with pt at today's visit.    Consulted and Agree with Plan of Care  Patient       Patient will benefit from skilled therapeutic intervention in order to improve the following deficits and impairments:  Abnormal gait, Decreased balance, Decreased endurance, Decreased mobility, Difficulty walking, Impaired perceived functional ability, Decreased activity tolerance, Decreased safety awareness  Visit Diagnosis: 1. Difficulty in walking, not elsewhere classified   2. Unsteadiness on feet   3. Muscle weakness (generalized)        Problem List Patient Active Problem List   Diagnosis Date Noted  . NPH (normal pressure hydrocephalus) ([place]) 07/30/2016  . Left leg weakness 04/20/2016  . Menopausal osteoporosis 03/04/2016  . Abnormal gait 12/18/2015  . Left foot drop 12/18/2015  . BP (high blood pressure) 08/24/2015  . Hydronephrosis, left 07/28/2015  . UPJ (ureteropelvic junction) obstruction 07/28/2015  . Lower urinary tract infectious disease 07/14/2015  . Hydronephrosis 07/14/2015    Nataniel Gasper PT, DPT 10/05/2018, 12:11 PM  [place] MAIN [facility] SERVICES [address] [place], [place], [address] Phone: [phone]   Fax:  [phone]  Name: Abigail Hill MRN: [id number] Date of Birth: 1951-02-22

## 2018-10-07 ENCOUNTER — Ambulatory Visit: Payer: PPO | Admitting: Physical Therapy

## 2018-10-07 ENCOUNTER — Other Ambulatory Visit: Payer: Self-pay

## 2018-10-07 ENCOUNTER — Encounter: Payer: Self-pay | Admitting: Physical Therapy

## 2018-10-07 DIAGNOSIS — R262 Difficulty in walking, not elsewhere classified: Secondary | ICD-10-CM | POA: Diagnosis not present

## 2018-10-07 DIAGNOSIS — M6281 Muscle weakness (generalized): Secondary | ICD-10-CM

## 2018-10-07 DIAGNOSIS — R2681 Unsteadiness on feet: Secondary | ICD-10-CM

## 2018-10-07 NOTE — Therapy (Signed)
[place] MAIN [facility] SERVICES [address] [place], [place], [address] Phone: [phone]   Fax:  [phone]  Physical Therapy Treatment  Patient Details  Name: Abigail Hill MRN: [id number] Date of Birth: [birth year] Referring Provider (PT): Dr. Metta Clines   Encounter Date: 10/07/2018  PT End of Session - 10/07/18 1113    Visit Number  21    Number of Visits  30    Date for PT Re-Evaluation  10/21/18    Authorization Type  goals addressed 09/23/18,    PT Start Time  1102    PT Stop Time  1145    PT Time Calculation (min)  43 min    Equipment Utilized During Treatment  Gait belt    Activity Tolerance  Patient limited by fatigue;Patient tolerated treatment well    Behavior During Therapy  [facility] for tasks assessed/performed       Past Medical History:  Diagnosis Date  . Abnormal gait 12/18/2015  . BP (high blood pressure) 08/24/2015  . Cancer ([place])    skin  . Constriction of ureter (postoperative) 07/28/2015  . Hydronephrosis 07/14/2015  . Hypertension   . Left foot drop 12/18/2015  . Recurrent UTI   . UPJ (ureteropelvic junction) obstruction 07/28/2015  . UTI (lower urinary tract infection) 07/14/2015    Past Surgical History:  Procedure Laterality Date  . broken foot  2012  . COLONOSCOPY WITH PROPOFOL N/A 06/30/2017   Procedure: COLONOSCOPY WITH PROPOFOL;  Surgeon: Lollie Sails, MD;  Location: [facility] ENDOSCOPY;  Service: Endoscopy;  Laterality: N/A;  . kidney repair  1985   repaired torn blood vessel in kidney    There were no vitals filed for this visit.  Subjective Assessment - 10/07/18 1106    Subjective  Patient reports doing well; She reports some soreness in the front of her thigh but states that its getting better. She reports her family is noticing that her walking is improving; denies any new falls;    Pertinent History  68 yo Female reports instability with gait with difficulty walking. She states, "my left leg gives me  trouble. It will  just plant and then I can't pick it up." Pt reports 2 falls in last month. She reports when she falls it is often falling backwards; She was diagnosed with hydrocephalus and did a drain trial with no relief; She did not qualify for a shunt. Repeat imaging showed continued hydrocephalus that has not worsened; MRI of lumbar spine shows L5 pars defect with sacral insufficiency; She denies any back pain and reports no numbness in legs. She reports feeling frustration because they can't seem to find what's wrong with her. She does have urinary incontience; reports knowing when she has to go to the bathroom but can't get there fast enough; Patient has a walker and rollator; She uses both; She also has a manual wheelchair that she uses intermittently;     Limitations  Standing;Walking    How long can you sit comfortably?  NA    How long can you stand comfortably?  has pain in LLE with prolonged stooping; <10 min;     How long can you walk comfortably?  200 feet with assistance requiring several breaks;     Diagnostic tests  has had MRI and brain imaging;     Patient Stated Goals  "Be able to travel more, little sight seeing; walk short distances" Patient reports that she would like to be able to get rid  of the walker.     Currently in Pain?  Yes    Pain Score  4     Pain Location  Leg    Pain Orientation  Right;Left;Anterior    Pain Descriptors / Indicators  Aching;Sore    Pain Type  Acute pain    Pain Onset  In the past 7 days    Pain Frequency  Intermittent    Aggravating Factors   stretching/standing    Pain Relieving Factors  rest/exercise/    Effect of Pain on Daily Activities  no change    Multiple Pain Sites  No            TREATMENT:  Patient prone: Instructed patient in alternate hamstring curl AROM 2#x15reps bilaterally; Passive quad stretch 20 sec hold x2 reps bilaterally with towel roll under knee for better iliopsoas stretch; Instructed patient in hip extension  SLR, 2#, 2x10reps bilaterally; Instructed patient in gluteal max hip extension (knee flexed)2#x15 reps bilaterally; Patient required min-moderate verbal/tactile cues for correct exercise techniquefor optimum muscle activation and strengthening;Patient required AAROM to achieve better ROM as patient very weak in hip extension; She exhibits decreased ROM on RLE but that improved with increased repetition;  Sidelying: Hip abduction SLR 2# x10  Bilaterally with min VCs to avoid trunk rotation for better hip stabilization and strengthening;  Leg press: BLE plate 90# [id number] with min VCs for proper positioning for optimal muscle strengthening; BLE plate 90 heel raises 2x15 with min VCs for sequencing and positioning to isolate calf strengthening;   Standing heel off step calf stretch 20 sec hold x2 reps bilaterally;  Standing outside parallel bars to challenge dynamic balance:  NMR:   PT instructed patient in standing on RLE, LLE hip flexion march x15reps with cues for weight shift and to avoid falling to LLE for better foot clearance; Patient able to exhibit better weight shift with increased repetition with less heavy step with LLE;  Gait overground, level surfacex80 feet with supervision with patient exhibiting improved LLE heel strike and improved reciprocal gait pattern with less left foot drag;  4 square stepping clockwise/counterclockwise x3 sets each, unsupported with cues for sequencing and foot placement; able to exhibit improved dynamic balance control, but does have decreased step length when stepping laterally or posteriorly;   Ladder drills x6 laps with CGA and cues to increase step length for better foot clearance; patient often exhibits decreased step length on LLE with hitting ladder rungs with left heel.   Resisted walking 12. 5# forward/backward, side step x2 laps with min A for safety;Pt exhibits better step length but does require cues for weight shift for  improved dynamic balance control; She had increased difficulty with eccentric return requiring min A to avoid loss of balance; She was able to progress this session with increased repetition and improved step length;    Response to treatment:Patient continues to demonstrate excellent motivation for therapy.Pt was able to exhibit better base of support with less widened steps and improved step length progressing to reciprocal pattern. Patient does better with increased repetition with improved motor planning and improved step position. She was able to step over leg press with LLE with good positioning getting on machine well without difficulty; She is also exhibiting improved bed mobility having less difficulty getting into prone/sidelying position;  She does fatigue at end of session;                     PT Education - 10/07/18 1108  Education Details  LE strengthening, balance/gait safety; HEP reinforced;    Person(s) Educated  Patient    Methods  Explanation;Verbal cues    Comprehension  Verbalized understanding;Returned demonstration;Verbal cues required;Need further instruction       PT Short Term Goals - 09/23/18 1149      PT SHORT TERM GOAL #1   Title  Patient will be adherent to HEP at least 3x a week to improve functional strength and balance for better safety at home.    Baseline  08/12/18- doing exercise daily;    Time  4    Period  Weeks    Status  Achieved    Target Date  08/17/18      PT SHORT TERM GOAL #2   Title   Patient will deny any falls over past 4 weeks to demonstrate improved safety awareness at home and work.     Baseline  08/12/18- fell 1 week ago; 09/23/18: none recent;    Time  4    Period  Weeks    Status  Achieved    Target Date  08/17/18        PT Long Term Goals - 09/23/18 1149      PT LONG TERM GOAL #1   Title  Patient will increase Berg Balance score by > 6 points to demonstrate decreased fall risk during functional  activities.    Time  8    Period  Weeks    Status  Achieved      PT LONG TERM GOAL #2   Title  Patient will increase 10 meter walk test to >0.8 m/s as to improve gait speed for better community ambulation and to reduce fall risk.    Time  4    Period  Weeks    Status  Partially Met    Target Date  10/21/18      PT LONG TERM GOAL #3   Title  Patient will be require no assist with ascend/descend 3 steps using Least restrictive assistive device to improve safety with entry/exit home.     Time  4    Period  Weeks    Status  Partially Met    Target Date  10/21/18      PT LONG TERM GOAL #4   Title  Patient will increase BLE gross strength to 4+/5 as to improve functional strength for independent gait, increased standing tolerance and increased ADL ability.    Time  4    Period  Weeks    Status  Partially Met    Target Date  10/21/18            Plan - 10/07/18 1524    Clinical Impression Statement  Patient motivated and participated well within session; She was able to exhibit slight improvement in LE AROM with prone exercise, although is still limited in some range of motion. Patient is progressing well with improved step length and less foot drag. She was able to exhibit improved step length with 4 square stepping but did have difficulty with ladder drills. patient would benefit from additional skilled PT intervention to improve strength, balance and gait safety;    Personal Factors and Comorbidities  Age;Time since onset of injury/illness/exacerbation;Comorbidity 3+    Comorbidities  HTN (controlled), hydrocephalus somewhat controlled, L5 pars defect;     Examination-Activity Limitations  Bend;Caring for Others;Carry;Continence;Dressing;Lift;Locomotion Level;Stairs;Stand;Transfers    Examination-Participation Restrictions  Cleaning;Community Activity;Driving;Laundry;Meal Prep;Shop;Yard Work    Stability/Clinical Decision Making  Evolving/Moderate complexity  Rehab Potential   Good    PT Frequency  2x / week    PT Duration  4 weeks    PT Treatment/Interventions  Cryotherapy;Electrical Stimulation;Moist Heat;Gait training;Stair training;Functional mobility training;Therapeutic activities;Therapeutic exercise;Balance training;Neuromuscular re-education;Patient/family education;Energy conservation    PT Next Visit Plan  Continue to work on balance activities and strengthening at next visit    PT [place]  Reviewed tandem stance and SLS at [place] with pt at today's visit.    Consulted and Agree with Plan of Care  Patient       Patient will benefit from skilled therapeutic intervention in order to improve the following deficits and impairments:  Abnormal gait, Decreased balance, Decreased endurance, Decreased mobility, Difficulty walking, Impaired perceived functional ability, Decreased activity tolerance, Decreased safety awareness  Visit Diagnosis: 1. Difficulty in walking, not elsewhere classified   2. Unsteadiness on feet   3. Muscle weakness (generalized)        Problem List Patient Active Problem List   Diagnosis Date Noted  . NPH (normal pressure hydrocephalus) ([place]) 07/30/2016  . Left leg weakness 04/20/2016  . Menopausal osteoporosis 03/04/2016  . Abnormal gait 12/18/2015  . Left foot drop 12/18/2015  . BP (high blood pressure) 08/24/2015  . Hydronephrosis, left 07/28/2015  . UPJ (ureteropelvic junction) obstruction 07/28/2015  . Lower urinary tract infectious disease 07/14/2015  . Hydronephrosis 07/14/2015    , PT, DPT 10/07/2018, 3:26 PM  [place] MAIN [facility] SERVICES [address] [place], [place], [address] Phone: [phone]   Fax:  [phone]  Name: Abigail Hill MRN: [id number] Date of Birth: 1951-02-07

## 2018-10-12 ENCOUNTER — Encounter: Payer: Self-pay | Admitting: Physical Therapy

## 2018-10-12 ENCOUNTER — Other Ambulatory Visit: Payer: Self-pay

## 2018-10-12 ENCOUNTER — Ambulatory Visit: Payer: PPO | Admitting: Physical Therapy

## 2018-10-12 DIAGNOSIS — R262 Difficulty in walking, not elsewhere classified: Secondary | ICD-10-CM

## 2018-10-12 DIAGNOSIS — R269 Unspecified abnormalities of gait and mobility: Secondary | ICD-10-CM

## 2018-10-12 DIAGNOSIS — R29898 Other symptoms and signs involving the musculoskeletal system: Secondary | ICD-10-CM

## 2018-10-12 DIAGNOSIS — M6281 Muscle weakness (generalized): Secondary | ICD-10-CM

## 2018-10-12 DIAGNOSIS — R2681 Unsteadiness on feet: Secondary | ICD-10-CM

## 2018-10-12 NOTE — Therapy (Signed)
[place] MAIN [facility] SERVICES [address] [place], [place], [address] Phone: [phone]   Fax:  [phone]  Physical Therapy Treatment  Patient Details  Name: IDELLA LAMONTAGNE MRN: [id number] Date of Birth: [birth year] Referring Provider (PT): Dr. Metta Clines   Encounter Date: 10/12/2018  PT End of Session - 10/12/18 1106    Visit Number  22    Number of Visits  30    Date for PT Re-Evaluation  10/21/18    Authorization Type  goals addressed 09/23/18,    PT Start Time  1100    PT Stop Time  1145    PT Time Calculation (min)  45 min    Equipment Utilized During Treatment  Gait belt    Activity Tolerance  Patient limited by fatigue;Patient tolerated treatment well    Behavior During Therapy  [facility] for tasks assessed/performed       Past Medical History:  Diagnosis Date  . Abnormal gait 70/17/2017  . BP (high blood pressure) 08/24/2015  . Cancer ([place])    skin  . Constriction of ureter (postoperative) 07/28/2015  . Hydronephrosis 07/14/2015  . Hypertension   . Left foot drop 12/18/2015  . Recurrent UTI   . UPJ (ureteropelvic junction) obstruction 07/28/2015  . UTI (lower urinary tract infection) 07/14/2015    Past Surgical History:  Procedure Laterality Date  . broken foot  2012  . COLONOSCOPY WITH PROPOFOL N/A 06/30/2017   Procedure: COLONOSCOPY WITH PROPOFOL;  Surgeon: Lollie Sails, MD;  Location: [facility] ENDOSCOPY;  Service: Endoscopy;  Laterality: N/A;  . kidney repair  1985   repaired torn blood vessel in kidney    There were no vitals filed for this visit.  Subjective Assessment - 10/12/18 1105    Subjective  Patient reports doing well; She reports some soreness in the front of her thigh but states that its getting better. She reports her family is noticing that her walking is improving; denies any new falls;    Pertinent History  68 yo Female reports instability with gait with difficulty walking. She states, "my left leg gives  me trouble. It will  just plant and then I can't pick it up." Pt reports 2 falls in last month. She reports when she falls it is often falling backwards; She was diagnosed with hydrocephalus and did a drain trial with no relief; She did not qualify for a shunt. Repeat imaging showed continued hydrocephalus that has not worsened; MRI of lumbar spine shows L5 pars defect with sacral insufficiency; She denies any back pain and reports no numbness in legs. She reports feeling frustration because they can't seem to find what's wrong with her. She does have urinary incontience; reports knowing when she has to go to the bathroom but can't get there fast enough; Patient has a walker and rollator; She uses both; She also has a manual wheelchair that she uses intermittently;     Limitations  Standing;Walking    How long can you sit comfortably?  NA    How long can you stand comfortably?  has pain in LLE with prolonged stooping; <10 min;     How long can you walk comfortably?  200 feet with assistance requiring several breaks;     Diagnostic tests  has had MRI and brain imaging;     Patient Stated Goals  "Be able to travel more, little sight seeing; walk short distances" Patient reports that she would like to be able to get rid  of the walker.     Pain Onset  In the past 7 days               TREATMENT:  Patient prone: Instructed patient in alternate hamstring curl AROM x15reps bilaterally; Passive quad stretch 20 sec hold x2 reps bilaterally ; Instructed patient in hip extension x 10 BLE Sidelying hip abd x 15 x 2 BLE, cues and TC for correct LE placement,Patient needs min VCs to avoid trunk rotation for better hip stabilization and strengthening  BLE SLR, 2#,2x10reps  Prone  gluteal max hip extension (knee flexed)x15 reps bilaterally; Patient required min-moderate verbal/tactile cues for correct exercise techniquefor optimum muscle activation and strengthening; Patient required AAROM to  achieve better ROM as patient very weak in hip extension and hip abd  Patient reports some quad discomfort that improved after stretch   Therapeutic activities; Standing with stepping pattern and rocking fwd and bwd, with VC for better weight shift and heel raise for rear foot and toe raise for fwd foot x 10 with HHA and CGA; performance improved with repetitions with better weight shift and less assistance needed  Standing and LLE toe tap fwd and to the middle to a target ; Patient initially unable to reach the target but after 60 secs of attempting, she was able to reach the target 10 out of 10 attempts and CGA  Standing on single leg to try and hold this position x 5 attempts on LLE and 5 attempts on RLE  Instructed in gait training without AD and HHA with cues to weight shift fwd/bwd, and and take longer steps 500 feet x 1   Patients performance improves with practice and she has fatigue following.              PT Education - 10/12/18 1105    Education Details  HEP    Person(s) Educated  Patient    Methods  Explanation    Comprehension  Verbalized understanding;Returned demonstration;Tactile cues required       PT Short Term Goals - 09/23/18 1149      PT SHORT TERM GOAL #1   Title  Patient will be adherent to HEP at least 3x a week to improve functional strength and balance for better safety at home.    Baseline  08/12/18- doing exercise daily;    Time  4    Period  Weeks    Status  Achieved    Target Date  08/17/18      PT SHORT TERM GOAL #2   Title   Patient will deny any falls over past 4 weeks to demonstrate improved safety awareness at home and work.     Baseline  08/12/18- fell 1 week ago; 09/23/18: none recent;    Time  4    Period  Weeks    Status  Achieved    Target Date  08/17/18        PT Long Term Goals - 09/23/18 1149      PT LONG TERM GOAL #1   Title  Patient will increase Berg Balance score by > 6 points to demonstrate decreased fall risk  during functional activities.    Time  8    Period  Weeks    Status  Achieved      PT LONG TERM GOAL #2   Title  Patient will increase 10 meter walk test to >0.8 m/s as to improve gait speed for better community ambulation and to reduce fall risk.  Time  4    Period  Weeks    Status  Partially Met    Target Date  10/21/18      PT LONG TERM GOAL #3   Title  Patient will be require no assist with ascend/descend 3 steps using Least restrictive assistive device to improve safety with entry/exit home.     Time  4    Period  Weeks    Status  Partially Met    Target Date  10/21/18      PT LONG TERM GOAL #4   Title  Patient will increase BLE gross strength to 4+/5 as to improve functional strength for independent gait, increased standing tolerance and increased ADL ability.    Time  4    Period  Weeks    Status  Partially Met    Target Date  10/21/18              Patient will benefit from skilled therapeutic intervention in order to improve the following deficits and impairments:     Visit Diagnosis: 1. Unsteadiness on feet   2. Difficulty in walking, not elsewhere classified   3. Muscle weakness (generalized)   4. Abnormal gait   5. Left leg weakness        Problem List Patient Active Problem List   Diagnosis Date Noted  . NPH (normal pressure hydrocephalus) ([place]) 07/30/2016  . Left leg weakness 04/20/2016  . Menopausal osteoporosis 03/04/2016  . Abnormal gait 12/18/2015  . Left foot drop 12/18/2015  . BP (high blood pressure) 08/24/2015  . Hydronephrosis, left 07/28/2015  . UPJ (ureteropelvic junction) obstruction 07/28/2015  . Lower urinary tract infectious disease 07/14/2015  . Hydronephrosis 07/14/2015    Alanson Puls, PT DPT 10/12/2018, 11:07 AM  [place] MAIN [facility] SERVICES [address] [place], [place], [address] Phone: [phone]   Fax:  [phone]  Name: MINAHIL QUINLIVAN MRN: [id number] Date of  Birth: 1951/01/26

## 2018-10-14 ENCOUNTER — Ambulatory Visit: Payer: PPO

## 2018-10-14 ENCOUNTER — Other Ambulatory Visit: Payer: Self-pay

## 2018-10-14 DIAGNOSIS — R262 Difficulty in walking, not elsewhere classified: Secondary | ICD-10-CM

## 2018-10-14 DIAGNOSIS — R2681 Unsteadiness on feet: Secondary | ICD-10-CM

## 2018-10-14 DIAGNOSIS — R29898 Other symptoms and signs involving the musculoskeletal system: Secondary | ICD-10-CM

## 2018-10-14 DIAGNOSIS — M6281 Muscle weakness (generalized): Secondary | ICD-10-CM

## 2018-10-14 DIAGNOSIS — R269 Unspecified abnormalities of gait and mobility: Secondary | ICD-10-CM

## 2018-10-14 NOTE — Therapy (Signed)
[place] MAIN [facility] SERVICES [address] [place], [place], [address] Phone: [phone]   Fax:  [phone]  Physical Therapy Treatment  Patient Details  Name: Abigail Hill MRN: [id number] Date of Birth: 09/09/1950 Referring Provider (PT): Dr. Metta Clines   Encounter Date: 10/14/2018  PT End of Session - 10/14/18 1134    Visit Number  23    Number of Visits  30    Date for PT Re-Evaluation  10/21/18    Authorization Type  goals addressed 09/23/18,    PT Start Time  1115    PT Stop Time  1140    PT Time Calculation (min)  25 min    Equipment Utilized During Treatment  Gait belt    Activity Tolerance  Patient limited by fatigue;Patient tolerated treatment well    Behavior During Therapy  [facility] for tasks assessed/performed       Past Medical History:  Diagnosis Date  . Abnormal gait 12/18/2015  . BP (high blood pressure) 08/24/2015  . Cancer ([place])    skin  . Constriction of ureter (postoperative) 07/28/2015  . Hydronephrosis 07/14/2015  . Hypertension   . Left foot drop 12/18/2015  . Recurrent UTI   . UPJ (ureteropelvic junction) obstruction 07/28/2015  . UTI (lower urinary tract infection) 07/14/2015    Past Surgical History:  Procedure Laterality Date  . broken foot  2012  . COLONOSCOPY WITH PROPOFOL N/A 06/30/2017   Procedure: COLONOSCOPY WITH PROPOFOL;  Surgeon: Lollie Sails, MD;  Location: [facility] ENDOSCOPY;  Service: Endoscopy;  Laterality: N/A;  . kidney repair  1985   repaired torn blood vessel in kidney    There were no vitals filed for this visit.  Subjective Assessment - 10/14/18 1134    Subjective  Pt doing well today, had  a physical yesterday with no updates to meds.    Pertinent History  68 yo Female reports instability with gait with difficulty walking. She states, "my left leg gives me trouble. It will  just plant and then I can't pick it up." Pt reports 2 falls in last month. She reports when she falls it is  often falling backwards; She was diagnosed with hydrocephalus and did a drain trial with no relief; She did not qualify for a shunt. Repeat imaging showed continued hydrocephalus that has not worsened; MRI of lumbar spine shows L5 pars defect with sacral insufficiency; She denies any back pain and reports no numbness in legs. She reports feeling frustration because they can't seem to find what's wrong with her. She does have urinary incontience; reports knowing when she has to go to the bathroom but can't get there fast enough; Patient has a walker and rollator; She uses both; She also has a manual wheelchair that she uses intermittently;     Currently in Pain?  No/denies        INTERVENTION THIS DATE: (abreviated for time restraints)  Leg press: BLE plate 105# [id number] with min VCs for proper positioning for optimal muscle strengthening; BLE plate 90 heel raises 2x15 with min VCs [id number] sequencing and positioning to isolate calf strengthening; Cable Resisted [place]: *unable to perform at 12.5lb as in prior sessions d/t instbaility/anxiety -1x26f at 7.5 Fwd, retro, Left Right (minA required)   -1x173fat 2.5 Fwd, retro, Left Right (minA required)         PT Short Term Goals - 09/23/18 1149      PT SHORT TERM GOAL #1   Title  Patient will be adherent to HEP at least 3x a week to improve functional strength and balance for better safety at home.    Baseline  08/12/18- doing exercise daily;    Time  4    Period  Weeks    Status  Achieved    Target Date  08/17/18      PT SHORT TERM GOAL #2   Title   Patient will deny any falls over past 4 weeks to demonstrate improved safety awareness at home and work.     Baseline  08/12/18- fell 1 week ago; 09/23/18: none recent;    Time  4    Period  Weeks    Status  Achieved    Target Date  08/17/18        PT Long Term Goals - 09/23/18 1149      PT LONG TERM GOAL #1   Title  Patient will increase Berg Balance score by > 6 points to  demonstrate decreased fall risk during functional activities.    Time  8    Period  Weeks    Status  Achieved      PT LONG TERM GOAL #2   Title  Patient will increase 10 meter walk test to >0.8 m/s as to improve gait speed for better community ambulation and to reduce fall risk.    Time  4    Period  Weeks    Status  Partially Met    Target Date  10/21/18      PT LONG TERM GOAL #3   Title  Patient will be require no assist with ascend/descend 3 steps using Least restrictive assistive device to improve safety with entry/exit home.     Time  4    Period  Weeks    Status  Partially Met    Target Date  10/21/18      PT LONG TERM GOAL #4   Title  Patient will increase BLE gross strength to 4+/5 as to improve functional strength for independent gait, increased standing tolerance and increased ADL ability.    Time  4    Period  Weeks    Status  Partially Met    Target Date  10/21/18            Plan - 10/14/18 1426    Clinical Impression Statement  Short session today as [place] [facility] was starting late and pt needed to leave on time for another appointment. Pt appears to be struggling a bit more with her balance and gait instability this date. WIth repeat of resisted cable walking, pt unable to perform with amount of resistance from last sesison, hence performs with 40% weight reduction, still requiring minA for recovery and with high anxiety level, but then is performed with 80% reduction and higher level of confidence and independence. Pt continues to feel frustrated with a perceived lack of progress in her balance compared to where she feels she ought to be. Good tolerance overall, no noted fatigue limitations, simply anxious when fearful of falling. Close guarding provided to allow for greater confidence and trust in author.    Rehab Potential  Good    PT Frequency  2x / week    PT Duration  4 weeks    PT Treatment/Interventions  Cryotherapy;Electrical Stimulation;Moist Heat;Gait  training;Stair training;Functional mobility training;Therapeutic activities;Therapeutic exercise;Balance training;Neuromuscular re-education;Patient/family education;Energy conservation    PT Next Visit Plan  Continue to work on balance activities and strengthening at next visit    PT  Home Exercise Plan  Reviewed tandem stance and SLS at kitchen counter with pt at today's visit.    Consulted and Agree with Plan of Care  Patient       Patient will benefit from skilled therapeutic intervention in order to improve the following deficits and impairments:  Abnormal gait, Decreased balance, Decreased endurance, Decreased mobility, Difficulty walking, Impaired perceived functional ability, Decreased activity tolerance, Decreased safety awareness  Visit Diagnosis: 1. Unsteadiness on feet   2. Difficulty in walking, not elsewhere classified   3. Muscle weakness (generalized)   4. Abnormal gait   5. Left leg weakness        Problem List Patient Active Problem List   Diagnosis Date Noted  . NPH (normal pressure hydrocephalus) ([place]) 07/30/2016  . Left leg weakness 04/20/2016  . Menopausal osteoporosis 03/04/2016  . Abnormal gait 12/18/2015  . Left foot drop 12/18/2015  . BP (high blood pressure) 08/24/2015  . Hydronephrosis, left 07/28/2015  . UPJ (ureteropelvic junction) obstruction 07/28/2015  . Lower urinary tract infectious disease 07/14/2015  . Hydronephrosis 07/14/2015   2:34 PM, 10/14/18 Etta Grandchild, PT, DPT Physical Therapist - [facility]  Outpatient Physical Therapy- [place] [phone]     Etta Grandchild 10/14/2018, 2:33 PM  [place] MAIN [facility] SERVICES [address] [place], [place], [address] Phone: [phone]   Fax:  [phone]  Name: Abigail Hill MRN: [id number] Date of Birth: May 31, 1950

## 2018-10-18 NOTE — Progress Notes (Signed)
Virtual Visit via Telephone Note The purpose of this virtual visit is to provide medical care while limiting exposure to the novel coronavirus.    Consent was obtained for phone visit:  Yes Answered questions that patient had about telehealth interaction:  Yes I discussed the limitations, risks, security and privacy concerns of performing an evaluation and management service by telephone. I also discussed with the patient that there may be a patient responsible charge related to this service. The patient expressed understanding and agreed to proceed.  Pt location: Home Physician Location: office Name of referring provider: Harrel Lemon, MD I connected with [name] at patients initiation/request on 10/20/2018 at 10:50 AM by telephone and verified that I am speaking with the correct person using two identifiers.  Pt MRN:  [id number] Pt DOB:  [birth year]   History of Present Illness:  Abigail Hill is a 68 year old right-handed Caucasian woman who follows up for Unsteady gait. She is accompanied by her husband and daughter who supplement history.  UPDATE: She had started physical therapy.  She had 4 sessions but was then cancelled due to COVID-19.  She had an appointment with [facility] with the [facility] Disorder [facility] but cancelled due to [place].  She restarted PT and has done so well  HISTORY: Since 2015-2016, she reports problems with balance and gait. She feels like she leans towards the left and tends to drag her left leg. There is no associated low back pain, radicular pain, or numbness. Over time, she started having increased falls. Around the same time, she started having urinary incontinence. It would occur while trying to reach the bathroom. She denied bowel dysfunction. She saw a neurologist in early 2018 and was found to have a foot drop, which was initially trated with an AFO and physical therapy. A NCV-EMG was ordered but never performed. She subsequently had an MRI  of the brain without contrast on 05/15/16, which was personally reviewed, which showed chronic hydrocephalus as demonstrated by moderate lateral and third ventriculomegaly with transependymal flow, slightly worse compared to a prior head CT from 04/29/09. MRI of lumbar spine from 06/19/16 showed sacral insufficiency fractures involving the left ala and S3 body and L5 chronic pars defect with disc degeneration but no nerve root impingement. She was seen by neurosurgery on 07/03/16, who felt that her symptoms were related to NPH rather than lumbosacral etiology and she was referred for a lumbar drain trial and consideration of VP shunt. She was admitted to [facility] in August 2018 for lumbar drain trial which was unsuccessful.X-rays of the hip were unremarkable.MRI of cervical spine from 06/28/17 showed multilevel degenerative changes with mild spinal stenosis from C3-4 to C5-6 without abnormal cord signal or significant mass effect. She underwent continued workup for abnormal gait: NCV-EMG of the lower extremities from 10/20/17 was normal. MRI of brain with and without contrast from 11/09/17 demonstrated chronic communicating hydrocephalus pattern, unchanged since March 2018.  She was then started on carbidopa-levodopa trial for possible Parkinson's disease.  DaTscan was performed on 03/11/18, which was normal. Carbidopa-levodopa was ineffective and she says she feels betteroff of it.  Past Medical History: Past Medical History:  Diagnosis Date   Abnormal gait 12/18/2015   BP (high blood pressure) 08/24/2015   Cancer ([place])    skin   Constriction of ureter (postoperative) 07/28/2015   Hydronephrosis 07/14/2015   Hypertension    Left foot drop 12/18/2015   Recurrent UTI    UPJ (ureteropelvic junction) obstruction 07/28/2015  UTI (lower urinary tract infection) 07/14/2015    Medications: Outpatient Encounter Medications as of 10/20/2018  Medication Sig Note   alendronate (FOSAMAX) 70 MG tablet      aspirin 81 MG chewable tablet Chew 81 mg by mouth daily.    Calcium Carbonate-Vit D-Min (CALTRATE PLUS PO) Take by mouth.    calcium-vitamin D (OSCAL WITH D) 250-125 MG-UNIT tablet Take by mouth.    mirabegron ER (MYRBETRIQ) 25 MG TB24 tablet Take 1 tablet (25 mg total) by mouth daily.    Multiple Vitamins-Minerals (CENTRUM SILVER PO) Take by mouth. 07/10/2015: Received from: [place]   nystatin-triamcinolone ointment [facility]) Apply 1 application topically 2 (two) times daily.    No facility-administered encounter medications on file as of 10/20/2018.     Allergies: Allergies  Allergen Reactions   Oxycodone     Family History: Family History  Problem Relation Age of Onset   Kidney disease Neg Hx    Bladder Cancer Neg Hx     Social History: Social History   Socioeconomic History   Marital status: Married    Spouse name: John   Number of children: 2   Years of education: Not on file   Highest education level: Not on file  Occupational History   Occupation: retired  Scientist, product/process development strain: Not on file   Food insecurity    Worry: Not on file    Inability: Not on Lexicographer needs    Medical: Not on file    Non-medical: Not on file  Tobacco Use   Smoking status: Former Smoker   Smokeless tobacco: Never Used   Tobacco comment: quit 10 years  Substance and Sexual Activity   Alcohol use: Never    Alcohol/week: 0.0 standard drinks    Frequency: Never   Drug use: No   Sexual activity: Yes  Lifestyle   Physical activity    Days per week: Not on file    Minutes per session: Not on file   Stress: Not on file  Relationships   Social connections    Talks on phone: Not on file    Gets together: Not on file    Attends religious service: Not on file    Active member of club or organization: Not on file    Attends meetings of clubs or organizations: Not on file    Relationship status: Not on file     Intimate partner violence    Fear of current or ex partner: Not on file    Emotionally abused: Not on file    Physically abused: Not on file    Forced sexual activity: Not on file  Other Topics Concern   Not on file  Social History Narrative   Patient is right-handed. She lives with husband in a one level home.    Observations/Objective:   Height 5\' 2"  (1.575 m), weight 162 lb (73.5 kg). No acute distress.  Alert and oriented.  Speech fluent and not dysarthric.  Language intact.  Eyes orthophoric on primary gaze.  Face symmetric.  Assessment and Plan:   Abnormal gait, which appears magnetic. I would think hydrocephalus is the cause of her symptoms as all other testing was unrevealing. However, CSF drain trial was negative. She does not appear to have Parkinson's disease. Another possibility would be primary freezing of gait as she describes sometimes inability to pick up her left leg. However, I think this is less likely as her gait isn't  shuffling.  She is doing well with PT.  Due to difficulty arranging appointments due to [place], she and her husband would like to defer referral to a movement disorder clinic for now.  As she is doing well, I agree.  We may consider early next year, if appointments are easier.  She will continue PT.  Follow Up Instructions:    -I discussed the assessment and treatment plan with the patient. The patient was provided an opportunity to ask questions and all were answered. The patient agreed with the plan and demonstrated an understanding of the instructions.   The patient was advised to call back or seek an in-person evaluation if the symptoms worsen or if the condition fails to improve as anticipated.    Total Time spent in visit with the patient was:  18 minutes  Dudley Major, DO

## 2018-10-19 ENCOUNTER — Encounter: Payer: Self-pay | Admitting: Physical Therapy

## 2018-10-19 ENCOUNTER — Other Ambulatory Visit: Payer: Self-pay

## 2018-10-19 ENCOUNTER — Ambulatory Visit: Payer: PPO | Admitting: Physical Therapy

## 2018-10-19 DIAGNOSIS — R262 Difficulty in walking, not elsewhere classified: Secondary | ICD-10-CM | POA: Diagnosis not present

## 2018-10-19 DIAGNOSIS — M6281 Muscle weakness (generalized): Secondary | ICD-10-CM

## 2018-10-19 DIAGNOSIS — R29898 Other symptoms and signs involving the musculoskeletal system: Secondary | ICD-10-CM

## 2018-10-19 DIAGNOSIS — R269 Unspecified abnormalities of gait and mobility: Secondary | ICD-10-CM

## 2018-10-19 DIAGNOSIS — R2681 Unsteadiness on feet: Secondary | ICD-10-CM

## 2018-10-19 NOTE — Therapy (Signed)
[place] MAIN [facility] SERVICES [address] [place], [place], [address] Phone: [phone]   Fax:  [phone]  Physical Therapy Treatment  Patient Details  Name: Abigail Hill MRN: [id number] Date of Birth: 02-Mar-1951 Referring Provider (PT): Dr. Metta Clines   Encounter Date: 10/19/2018  PT End of Session - 10/19/18 1200    Visit Number  24    Number of Visits  30    Date for PT Re-Evaluation  10/21/18    Authorization Type  goals addressed 09/23/18,    PT Start Time  1145    PT Stop Time  1230    PT Time Calculation (min)  45 min    Equipment Utilized During Treatment  Gait belt    Activity Tolerance  Patient limited by fatigue;Patient tolerated treatment well    Behavior During Therapy  [facility] for tasks assessed/performed       Past Medical History:  Diagnosis Date  . Abnormal gait 12/18/2015  . BP (high blood pressure) 08/24/2015  . Cancer ([place])    skin  . Constriction of ureter (postoperative) 07/28/2015  . Hydronephrosis 07/14/2015  . Hypertension   . Left foot drop 12/18/2015  . Recurrent UTI   . UPJ (ureteropelvic junction) obstruction 07/28/2015  . UTI (lower urinary tract infection) 07/14/2015    Past Surgical History:  Procedure Laterality Date  . broken foot  2012  . COLONOSCOPY WITH PROPOFOL N/A 06/30/2017   Procedure: COLONOSCOPY WITH PROPOFOL;  Surgeon: Lollie Sails, MD;  Location: [facility] ENDOSCOPY;  Service: Endoscopy;  Laterality: N/A;  . kidney repair  1985   repaired torn blood vessel in kidney    There were no vitals filed for this visit.  Subjective Assessment - 10/19/18 1158    Subjective  Pt doing well today,She has a pain in the back of her left arm that comes on at night when she gets up and reaches for the back of the door.    Pertinent History  68 yo Female reports instability with gait with difficulty walking. She states, "my left leg gives me trouble. It will  just plant and then I can't pick it up." Pt  reports 2 falls in last month. She reports when she falls it is often falling backwards; She was diagnosed with hydrocephalus and did a drain trial with no relief; She did not qualify for a shunt. Repeat imaging showed continued hydrocephalus that has not worsened; MRI of lumbar spine shows L5 pars defect with sacral insufficiency; She denies any back pain and reports no numbness in legs. She reports feeling frustration because they can't seem to find what's wrong with her. She does have urinary incontience; reports knowing when she has to go to the bathroom but can't get there fast enough; Patient has a walker and rollator; She uses both; She also has a manual wheelchair that she uses intermittently;     Limitations  Standing;Walking    How long can you sit comfortably?  NA    How long can you stand comfortably?  has pain in LLE with prolonged stooping; <10 min;     How long can you walk comfortably?  200 feet with assistance requiring several breaks;     Diagnostic tests  has had MRI and brain imaging;     Patient Stated Goals  "Be able to travel more, little sight seeing; walk short distances" Patient reports that she would like to be able to get rid of the walker.  Currently in Pain?  No/denies    Pain Score  0-No pain    Pain Onset  In the past 7 days       Treatment: Prone hip flexor stretch 30 sec x 3 BLE Prone knee flex with 3 lbs BLE x 20  Prone hip extension x 10 BLE Standing hamstring stretch BLE 30 sec x 3  Standing gastroc/soleus stretch x 30 sec x 3  4 square steeping fwd/ bwd one foot at a time x 10 BLE Stepping pattern and weight shifting fwd/ bwd and cues to get all the way fwd over fwd foot x 10 BLE , needs min assist at first and then CGA  Gait training in hall with concentration on longer steps, heel toe gait, and head turns x 100 feet x 4    Patient needs HHA and CGA and max vC for weight shifting over and for taking longer steps. She has fear of falling especially  during times when she is moving supine to sit without anything to hold onto.                      PT Education - 10/19/18 1159    Education Details  HEP    Person(s) Educated  Patient    Methods  Explanation    Comprehension  Verbalized understanding       PT Short Term Goals - 09/23/18 1149      PT SHORT TERM GOAL #1   Title  Patient will be adherent to HEP at least 3x a week to improve functional strength and balance for better safety at home.    Baseline  08/12/18- doing exercise daily;    Time  4    Period  Weeks    Status  Achieved    Target Date  08/17/18      PT SHORT TERM GOAL #2   Title   Patient will deny any falls over past 4 weeks to demonstrate improved safety awareness at home and work.     Baseline  08/12/18- fell 1 week ago; 09/23/18: none recent;    Time  4    Period  Weeks    Status  Achieved    Target Date  08/17/18        PT Long Term Goals - 09/23/18 1149      PT LONG TERM GOAL #1   Title  Patient will increase Berg Balance score by > 6 points to demonstrate decreased fall risk during functional activities.    Time  8    Period  Weeks    Status  Achieved      PT LONG TERM GOAL #2   Title  Patient will increase 10 meter walk test to >0.8 m/s as to improve gait speed for better community ambulation and to reduce fall risk.    Time  4    Period  Weeks    Status  Partially Met    Target Date  10/21/18      PT LONG TERM GOAL #3   Title  Patient will be require no assist with ascend/descend 3 steps using Least restrictive assistive device to improve safety with entry/exit home.     Time  4    Period  Weeks    Status  Partially Met    Target Date  10/21/18      PT LONG TERM GOAL #4   Title  Patient will increase BLE gross strength to 4+/5 as  to improve functional strength for independent gait, increased standing tolerance and increased ADL ability.    Time  4    Period  Weeks    Status  Partially Met    Target Date  10/21/18             Plan - 10/19/18 1310    Clinical Impression Statement  Patient performs stretches of hip flexors, hamstring and gastroc/soleus. She is able to work on weight shifting over her LE's with correct posture and step length. She will conitnue to benefit from skilled PT to improve mobility and gait.    Rehab Potential  Good    PT Frequency  2x / week    PT Duration  4 weeks    PT Treatment/Interventions  Cryotherapy;Electrical Stimulation;Moist Heat;Gait training;Stair training;Functional mobility training;Therapeutic activities;Therapeutic exercise;Balance training;Neuromuscular re-education;Patient/family education;Energy conservation    PT Next Visit Plan  Continue to work on balance activities and strengthening at next visit    PT [place]  Reviewed tandem stance and SLS at [place] with pt at today's visit.    Consulted and Agree with Plan of Care  Patient       Patient will benefit from skilled therapeutic intervention in order to improve the following deficits and impairments:  Abnormal gait, Decreased balance, Decreased endurance, Decreased mobility, Difficulty walking, Impaired perceived functional ability, Decreased activity tolerance, Decreased safety awareness  Visit Diagnosis: 1. Unsteadiness on feet   2. Difficulty in walking, not elsewhere classified   3. Muscle weakness (generalized)   4. Abnormal gait   5. Left leg weakness        Problem List Patient Active Problem List   Diagnosis Date Noted  . NPH (normal pressure hydrocephalus) ([place]) 07/30/2016  . Left leg weakness 04/20/2016  . Menopausal osteoporosis 03/04/2016  . Abnormal gait 12/18/2015  . Left foot drop 12/18/2015  . BP (high blood pressure) 08/24/2015  . Hydronephrosis, left 07/28/2015  . UPJ (ureteropelvic junction) obstruction 07/28/2015  . Lower urinary tract infectious disease 07/14/2015  . Hydronephrosis 07/14/2015    Alanson Puls, PT DPT 10/19/2018, 1:12  PM  [place] [facility] MAIN [facility] SERVICES [address] [place], [place], [address] Phone: [phone]   Fax:  [phone]  Name: Abigail Hill MRN: [id number] Date of Birth: 1950-08-09

## 2018-10-20 ENCOUNTER — Encounter: Payer: Self-pay | Admitting: Neurology

## 2018-10-20 ENCOUNTER — Other Ambulatory Visit: Payer: Self-pay

## 2018-10-20 ENCOUNTER — Telehealth (INDEPENDENT_AMBULATORY_CARE_PROVIDER_SITE_OTHER): Payer: PPO | Admitting: Neurology

## 2018-10-20 VITALS — Ht 62.0 in | Wt 162.0 lb

## 2018-10-20 DIAGNOSIS — R2681 Unsteadiness on feet: Secondary | ICD-10-CM

## 2018-10-20 DIAGNOSIS — G9389 Other specified disorders of brain: Secondary | ICD-10-CM

## 2018-10-21 ENCOUNTER — Ambulatory Visit: Payer: PPO | Admitting: Physical Therapy

## 2018-10-21 ENCOUNTER — Encounter: Payer: Self-pay | Admitting: Physical Therapy

## 2018-10-21 DIAGNOSIS — R262 Difficulty in walking, not elsewhere classified: Secondary | ICD-10-CM | POA: Diagnosis not present

## 2018-10-21 DIAGNOSIS — M6281 Muscle weakness (generalized): Secondary | ICD-10-CM

## 2018-10-21 DIAGNOSIS — R2681 Unsteadiness on feet: Secondary | ICD-10-CM

## 2018-10-21 NOTE — Therapy (Signed)
[place] MAIN [facility] SERVICES [address] [place], [place], [address] Phone: [phone]   Fax:  [phone]  Physical Therapy Treatment  Patient Details  Name: Abigail Hill MRN: [id number] Date of Birth: 05-10-1950 Referring Provider (PT): Dr. Metta Clines   Encounter Date: 10/21/2018  PT End of Session - 10/21/18 1136    Visit Number  25    Number of Visits  46    Date for PT Re-Evaluation  12/16/18    Authorization Type  goals addressed 09/23/18,    PT Start Time  1140    PT Stop Time  1220    PT Time Calculation (min)  40 min    Equipment Utilized During Treatment  Gait belt    Activity Tolerance  Patient limited by fatigue;Patient tolerated treatment well    Behavior During Therapy  [facility] for tasks assessed/performed       Past Medical History:  Diagnosis Date  . Abnormal gait 12/18/2015  . BP (high blood pressure) 08/24/2015  . Cancer ([place])    skin  . Constriction of ureter (postoperative) 07/28/2015  . Hydronephrosis 07/14/2015  . Hypertension   . Left foot drop 12/18/2015  . Recurrent UTI   . UPJ (ureteropelvic junction) obstruction 07/28/2015  . UTI (lower urinary tract infection) 07/14/2015    Past Surgical History:  Procedure Laterality Date  . broken foot  2012  . COLONOSCOPY WITH PROPOFOL N/A 06/30/2017   Procedure: COLONOSCOPY WITH PROPOFOL;  Surgeon: Lollie Sails, MD;  Location: [facility] ENDOSCOPY;  Service: Endoscopy;  Laterality: N/A;  . kidney repair  1985   repaired torn blood vessel in kidney    There were no vitals filed for this visit.  Subjective Assessment - 10/21/18 1150    Subjective  Patient reports doing well; Denies any new falls; Reports that her MD appointment went well;    Pertinent History  68 yo Female reports instability with gait with difficulty walking. She states, "my left leg gives me trouble. It will  just plant and then I can't pick it up." Pt reports 2 falls in last month. She reports when  she falls it is often falling backwards; She was diagnosed with hydrocephalus and did a drain trial with no relief; She did not qualify for a shunt. Repeat imaging showed continued hydrocephalus that has not worsened; MRI of lumbar spine shows L5 pars defect with sacral insufficiency; She denies any back pain and reports no numbness in legs. She reports feeling frustration because they can't seem to find what's wrong with her. She does have urinary incontience; reports knowing when she has to go to the bathroom but can't get there fast enough; Patient has a walker and rollator; She uses both; She also has a manual wheelchair that she uses intermittently;     Limitations  Standing;Walking    How long can you sit comfortably?  NA    How long can you stand comfortably?  has pain in LLE with prolonged stooping; <10 min;     How long can you walk comfortably?  200 feet with assistance requiring several breaks;     Diagnostic tests  has had MRI and brain imaging;     Patient Stated Goals  "Be able to travel more, little sight seeing; walk short distances" Patient reports that she would like to be able to get rid of the walker.     Currently in Pain?  No/denies    Pain Onset  In the  past 7 days    Multiple Pain Sites  No         [facility] PT Assessment - 10/21/18 0001      Standardized Balance Assessment   10 Meter Walk  0.67 m/s without AD (limited home ambulator, improved from 0.5 m/s on 08/12/18)              TREATMENT:  Patient prone: Instructed patient in alternate hamstring curl AROM 3#x10reps bilaterally; Passive quad stretch 20 sec hold x2 reps bilaterally with towel roll under knee for better iliopsoas stretch; Instructed patient in hip extension SLR, 3#,2x10reps bilaterally; Instructed patient in gluteal max hip extension (knee flexed)3#2x10 reps bilaterally; Patient required min-moderate verbal/tactile cues for correct exercise techniquefor optimum muscle activation and  strengthening;Patient required AAROM to achieve better ROM as patient very weak in hip extension; She exhibits decreased ROM on RLE but that improved with increased repetition;  Tall kneeling, squat to heel and then come to tall kneeling hip extension x10 reps unsupported;  Standing heel off step calf stretch 20 sec hold x2 reps bilaterally;  Standing outside parallel bars to challenge dynamic balance:  NMR: Patient in 1/2 kneeling, unsupported, arms across chest trunk rotation x5 reps each direction with each foot in front; required min A for safety; Patient also required 1 HHA to get into 1/2 kneeling position. She required mod Vcs to improve weight shift and increase upper trunk control for better balance;  PT instructed patient in standing on RLE, LLE hip flexion march x15reps with cues for weight shift and to avoid falling to LLE for better foot clearance; Patient able to exhibit better weight shift with increased repetition with less heavy step with LLE;  Gait overground, level surfacex80 feet with supervision with patient exhibiting improved LLE heel strike and improved reciprocal gait pattern with less left foot drag;  Resisted walking 12. 5# forward/backward x2 laps, 7.5# side stepx2 laps with min A for safety;Pt exhibitsbetter step length but does require cues for weight shift for improved dynamic balance control;She had increased difficulty with eccentric return requiring min A to avoid loss of balance; She was able to progress this session with increased repetition and improved step length;   Response to treatment:Patient continues to demonstrate excellent motivation for therapy.Pt was able to exhibit better base of support with less widened steps and improved step length progressing to reciprocal pattern. Patient does better with increased repetition with improved motor planning and improved step position. She is exhibiting improved bed mobility having less  difficulty getting into prone/sidelying position;  She does fatigue at end of session;               PT Education - 10/21/18 1136    Education Details  strengthening/ROM; HEP, balance/gait safety    Person(s) Educated  Patient    Methods  Explanation;Verbal cues    Comprehension  Verbalized understanding;Returned demonstration;Verbal cues required;Need further instruction       PT Short Term Goals - 09/23/18 1149      PT SHORT TERM GOAL #1   Title  Patient will be adherent to HEP at least 3x a week to improve functional strength and balance for better safety at home.    Baseline  08/12/18- doing exercise daily;    Time  4    Period  Weeks    Status  Achieved    Target Date  08/17/18      PT SHORT TERM GOAL #2   Title   Patient will deny  any falls over past 4 weeks to demonstrate improved safety awareness at home and work.     Baseline  08/12/18- fell 1 week ago; 09/23/18: none recent;    Time  4    Period  Weeks    Status  Achieved    Target Date  08/17/18        PT Long Term Goals - 10/21/18 1221      PT LONG TERM GOAL #1   Title  Patient will increase Berg Balance score by > 6 points to demonstrate decreased fall risk during functional activities.    Time  8    Period  Weeks    Status  Achieved      PT LONG TERM GOAL #2   Title  Patient will increase 10 meter walk test to >0.8 m/s as to improve gait speed for better community ambulation and to reduce fall risk.    Time  8    Period  Weeks    Status  Partially Met    Target Date  12/16/18      PT LONG TERM GOAL #3   Title  Patient will be require no assist with ascend/descend 3 steps using Least restrictive assistive device to improve safety with entry/exit home.     Time  8    Period  Weeks    Status  Partially Met    Target Date  12/16/18      PT LONG TERM GOAL #4   Title  Patient will increase BLE gross strength to 4+/5 as to improve functional strength for independent gait, increased standing  tolerance and increased ADL ability.    Time  8    Period  Weeks    Status  Partially Met    Target Date  12/16/18            Plan - 10/21/18 1151    Clinical Impression Statement  Patient tolerated session well. She exhibits improved ROM and strength being able to progress exercise. patient does continue to require cues for weight shift for better foot clearance. She does exhibit improved step length for reciprocal gait pattern which is inconsistent. Overall her gait speed is improved since start of care, however she is still walking at a limited home ambulator speed. Patient would benefit from additional skilled PT Intervention to improve strength, balance and gait safety;    Rehab Potential  Good    PT Frequency  2x / week    PT Duration  8 weeks    PT Treatment/Interventions  Cryotherapy;Electrical Stimulation;Moist Heat;Gait training;Stair training;Functional mobility training;Therapeutic activities;Therapeutic exercise;Balance training;Neuromuscular re-education;Patient/family education;Energy conservation    PT Next Visit Plan  Continue to work on balance activities and strengthening at next visit    PT [place]  Reviewed tandem stance and SLS at [place] with pt at today's visit.    Consulted and Agree with Plan of Care  Patient       Patient will benefit from skilled therapeutic intervention in order to improve the following deficits and impairments:  Abnormal gait, Decreased balance, Decreased endurance, Decreased mobility, Difficulty walking, Impaired perceived functional ability, Decreased activity tolerance, Decreased safety awareness  Visit Diagnosis: Unsteadiness on feet  Difficulty in walking, not elsewhere classified  Muscle weakness (generalized)     Problem List Patient Active Problem List   Diagnosis Date Noted  . NPH (normal pressure hydrocephalus) ([place]) 07/30/2016  . Left leg weakness 04/20/2016  . Menopausal osteoporosis 03/04/2016  .  Abnormal  gait 12/18/2015  . Left foot drop 12/18/2015  . BP (high blood pressure) 08/24/2015  . Hydronephrosis, left 07/28/2015  . UPJ (ureteropelvic junction) obstruction 07/28/2015  . Lower urinary tract infectious disease 07/14/2015  . Hydronephrosis 07/14/2015    Denyla Cortese PT, DPT 10/21/2018, 12:57 PM  [place] MAIN [facility] SERVICES [address] [place], [place], [address] Phone: [phone]   Fax:  [phone]  Name: ESTEFANIE CORNFORTH MRN: [id number] Date of Birth: 16-Apr-1950

## 2018-10-25 ENCOUNTER — Ambulatory Visit: Payer: PPO | Admitting: Physical Therapy

## 2018-10-25 ENCOUNTER — Other Ambulatory Visit: Payer: Self-pay

## 2018-10-25 ENCOUNTER — Encounter: Payer: Self-pay | Admitting: Physical Therapy

## 2018-10-25 DIAGNOSIS — R262 Difficulty in walking, not elsewhere classified: Secondary | ICD-10-CM

## 2018-10-25 DIAGNOSIS — M6281 Muscle weakness (generalized): Secondary | ICD-10-CM

## 2018-10-25 DIAGNOSIS — R2681 Unsteadiness on feet: Secondary | ICD-10-CM

## 2018-10-25 NOTE — Therapy (Signed)
[place] MAIN [facility] SERVICES [address] [place], [place], [address] Phone: [phone]   Fax:  [phone]  Physical Therapy Treatment  Patient Details  Name: Abigail Hill MRN: [id number] Date of Birth: 1951-01-15 Referring Provider (PT): Dr. Metta Clines   Encounter Date: 10/25/2018  PT End of Session - 10/25/18 1200    Visit Number  26    Number of Visits  46    Date for PT Re-Evaluation  12/16/18    Authorization Type  goals addressed 09/23/18,    PT Start Time  1145    PT Stop Time  1230    PT Time Calculation (min)  45 min    Equipment Utilized During Treatment  Gait belt    Activity Tolerance  Patient limited by fatigue;Patient tolerated treatment well    Behavior During Therapy  [facility] for tasks assessed/performed       Past Medical History:  Diagnosis Date  . Abnormal gait 12/18/2015  . BP (high blood pressure) 08/24/2015  . Cancer ([place])    skin  . Constriction of ureter (postoperative) 07/28/2015  . Hydronephrosis 07/14/2015  . Hypertension   . Left foot drop 12/18/2015  . Recurrent UTI   . UPJ (ureteropelvic junction) obstruction 07/28/2015  . UTI (lower urinary tract infection) 07/14/2015    Past Surgical History:  Procedure Laterality Date  . broken foot  2012  . COLONOSCOPY WITH PROPOFOL N/A 06/30/2017   Procedure: COLONOSCOPY WITH PROPOFOL;  Surgeon: Lollie Sails, MD;  Location: [facility] ENDOSCOPY;  Service: Endoscopy;  Laterality: N/A;  . kidney repair  1985   repaired torn blood vessel in kidney    There were no vitals filed for this visit.  Subjective Assessment - 10/25/18 1159    Subjective  Patient reports doing well; She reports some soreness after last session but states that after 1-2 days it worked itself out. She does report increased swelling in LLE today;    Pertinent History  68 yo Female reports instability with gait with difficulty walking. She states, "my left leg gives me trouble. It will  just  plant and then I can't pick it up." Pt reports 2 falls in last month. She reports when she falls it is often falling backwards; She was diagnosed with hydrocephalus and did a drain trial with no relief; She did not qualify for a shunt. Repeat imaging showed continued hydrocephalus that has not worsened; MRI of lumbar spine shows L5 pars defect with sacral insufficiency; She denies any back pain and reports no numbness in legs. She reports feeling frustration because they can't seem to find what's wrong with her. She does have urinary incontience; reports knowing when she has to go to the bathroom but can't get there fast enough; Patient has a walker and rollator; She uses both; She also has a manual wheelchair that she uses intermittently;     Limitations  Standing;Walking    How long can you sit comfortably?  NA    How long can you stand comfortably?  has pain in LLE with prolonged stooping; <10 min;     How long can you walk comfortably?  200 feet with assistance requiring several breaks;     Diagnostic tests  has had MRI and brain imaging;     Patient Stated Goals  "Be able to travel more, little sight seeing; walk short distances" Patient reports that she would like to be able to get rid of the walker.  Currently in Pain?  No/denies    Pain Onset  In the past 7 days    Multiple Pain Sites  No         TREATMENT:   Patient prone: Instructed patient in alternate hamstring curl AROM 3#x10 reps bilaterally; Passive quad stretch 20 sec hold x2 reps bilaterally with towel roll under knee for better iliopsoas stretch; Instructed patient in hip extension SLR, 3#,  2x12 reps bilaterally; Instructed patient in gluteal max hip extension (knee flexed)3# x15 reps bilaterally; Patient required min-moderate verbal/tactile cues for correct exercise technique for optimum muscle activation and strengthening; Patient required AAROM to achieve better ROM as patient very weak in hip extension; She exhibits  decreased ROM on RLE but that improved with increased repetition;    Tall kneeling, squat to heel and then come to tall kneeling hip extension x10 reps unsupported; Required min A for balance/safety;     NMR:   Patient in 1/2 kneeling, unsupported, arms across chest trunk rotation x5 reps each direction with each foot in front; required min A for safety; Patient also required 1 HHA to get into 1/2 kneeling position. She required mod Vcs to improve weight shift and increase upper trunk control for better balance;    PT instructed patient in standing on RLE, LLE hip flexion march x15 reps, x2 sets with cues for weight shift and to avoid falling to LLE for better foot clearance; Patient able to exhibit better weight shift with increased repetition with less heavy step with LLE; utilized mirror for visual cues for better foot clearance;  Weaving around cones #6 x2 laps unsupported with min VCs to increase step length and improve turning for better obstacle negotiation;   Side stepping over cones #6 x1 set each direction with min-mod A for safety and mod VCs for weight shift and to increase hip flexion/step length for better foot clearance; patient unable to fully clear foot and often steps around cones due to difficulty clearing obstacles. With increased repetition patient able to increase step length and exhibits better foot clearance;     Gait overground, level surface x80 feet with supervision with patient exhibiting improved LLE heel strike and improved reciprocal gait pattern with less left foot drag; She ambulates with better base of support and less lateral lean exhibiting improved gait ability;      Response to treatment: Patient continues to demonstrate excellent motivation for therapy. Pt was able to exhibit better base of support with less widened steps and improved step length progressing to reciprocal pattern. Patient does better with increased repetition with improved motor planning and  improved step position. She is exhibiting improved bed mobility having less difficulty getting into prone/qped position;  She does reports mild fatigue at end of session;                     PT Education - 10/25/18 1159    Education Details  strengthening/ROM; HEP/balance/gait safety    Person(s) Educated  Patient    Methods  Explanation;Verbal cues    Comprehension  Verbalized understanding;Returned demonstration;Verbal cues required;Need further instruction       PT Short Term Goals - 09/23/18 1149      PT SHORT TERM GOAL #1   Title  Patient will be adherent to HEP at least 3x a week to improve functional strength and balance for better safety at home.    Baseline  08/12/18- doing exercise daily;    Time  4  Period  Weeks    Status  Achieved    Target Date  08/17/18      PT SHORT TERM GOAL #2   Title   Patient will deny any falls over past 4 weeks to demonstrate improved safety awareness at home and work.     Baseline  08/12/18- fell 1 week ago; 09/23/18: none recent;    Time  4    Period  Weeks    Status  Achieved    Target Date  08/17/18        PT Long Term Goals - 10/21/18 1221      PT LONG TERM GOAL #1   Title  Patient will increase Berg Balance score by > 6 points to demonstrate decreased fall risk during functional activities.    Time  8    Period  Weeks    Status  Achieved      PT LONG TERM GOAL #2   Title  Patient will increase 10 meter walk test to >0.8 m/s as to improve gait speed for better community ambulation and to reduce fall risk.    Time  8    Period  Weeks    Status  Partially Met    Target Date  12/16/18      PT LONG TERM GOAL #3   Title  Patient will be require no assist with ascend/descend 3 steps using Least restrictive assistive device to improve safety with entry/exit home.     Time  8    Period  Weeks    Status  Partially Met    Target Date  12/16/18      PT LONG TERM GOAL #4   Title  Patient will increase BLE gross  strength to 4+/5 as to improve functional strength for independent gait, increased standing tolerance and increased ADL ability.    Time  8    Period  Weeks    Status  Partially Met    Target Date  12/16/18            Plan - 10/25/18 1214    Clinical Impression Statement  Patient motivated and tolerated session well. She continues to progress LE strengthening, tolerating increased repetition well. Patient continues to have difficulty achieving full ROM against gravity due to weakness. She was instructed in advanced balance/gait tasks to challenge weight shift to improve LLE foot clearance. Patient fatigues with advanced exercise. However following advanced balance exercise she was able to exhibit better foot clearance. She would benefit from additional skilled PT Intervention to improve strength, balance and gait safety;    Rehab Potential  Good    PT Frequency  2x / week    PT Duration  8 weeks    PT Treatment/Interventions  Cryotherapy;Electrical Stimulation;Moist Heat;Gait training;Stair training;Functional mobility training;Therapeutic activities;Therapeutic exercise;Balance training;Neuromuscular re-education;Patient/family education;Energy conservation    PT Next Visit Plan  Continue to work on balance activities and strengthening at next visit    PT [place]  Reviewed tandem stance and SLS at [place] with pt at today's visit.    Consulted and Agree with Plan of Care  Patient       Patient will benefit from skilled therapeutic intervention in order to improve the following deficits and impairments:  Abnormal gait, Decreased balance, Decreased endurance, Decreased mobility, Difficulty walking, Impaired perceived functional ability, Decreased activity tolerance, Decreased safety awareness  Visit Diagnosis: Unsteadiness on feet  Difficulty in walking, not elsewhere classified  Muscle weakness (generalized)  Problem List Patient Active Problem List    Diagnosis Date Noted  . NPH (normal pressure hydrocephalus) ([place]) 07/30/2016  . Left leg weakness 04/20/2016  . Menopausal osteoporosis 03/04/2016  . Abnormal gait 12/18/2015  . Left foot drop 12/18/2015  . BP (high blood pressure) 08/24/2015  . Hydronephrosis, left 07/28/2015  . UPJ (ureteropelvic junction) obstruction 07/28/2015  . Lower urinary tract infectious disease 07/14/2015  . Hydronephrosis 07/14/2015    , PT, DPT 10/25/2018, 12:48 PM  [place] MAIN [facility] SERVICES [address] [place], [place], [address] Phone: [phone]   Fax:  [phone]  Name: SHABANA ARMENTROUT MRN: [id number] Date of Birth: 05/11/1950

## 2018-10-27 ENCOUNTER — Encounter: Payer: Self-pay | Admitting: Physical Therapy

## 2018-10-27 ENCOUNTER — Other Ambulatory Visit: Payer: Self-pay

## 2018-10-27 ENCOUNTER — Ambulatory Visit: Payer: PPO | Admitting: Physical Therapy

## 2018-10-27 DIAGNOSIS — R262 Difficulty in walking, not elsewhere classified: Secondary | ICD-10-CM

## 2018-10-27 DIAGNOSIS — R2681 Unsteadiness on feet: Secondary | ICD-10-CM

## 2018-10-27 DIAGNOSIS — M6281 Muscle weakness (generalized): Secondary | ICD-10-CM

## 2018-10-27 NOTE — Therapy (Signed)
[place] MAIN [facility] SERVICES [address] [place], [place], [address] Phone: [phone]   Fax:  [phone]  Physical Therapy Treatment  Patient Details  Name: Abigail Hill MRN: [id number] Date of Birth: Dec 25, 1950 Referring Provider (PT): Dr. Metta Clines   Encounter Date: 10/27/2018  PT End of Session - 10/27/18 1106    Visit Number  27    Number of Visits  46    Date for PT Re-Evaluation  12/16/18    Authorization Type  goals addressed 09/23/18,    PT Start Time  1102    PT Stop Time  1145    PT Time Calculation (min)  43 min    Equipment Utilized During Treatment  Gait belt    Activity Tolerance  Patient limited by fatigue;Patient tolerated treatment well    Behavior During Therapy  [facility] for tasks assessed/performed       Past Medical History:  Diagnosis Date  . Abnormal gait 12/18/2015  . BP (high blood pressure) 08/24/2015  . Cancer ([place])    skin  . Constriction of ureter (postoperative) 07/28/2015  . Hydronephrosis 07/14/2015  . Hypertension   . Left foot drop 12/18/2015  . Recurrent UTI   . UPJ (ureteropelvic junction) obstruction 07/28/2015  . UTI (lower urinary tract infection) 07/14/2015    Past Surgical History:  Procedure Laterality Date  . broken foot  2012  . COLONOSCOPY WITH PROPOFOL N/A 06/30/2017   Procedure: COLONOSCOPY WITH PROPOFOL;  Surgeon: Lollie Sails, MD;  Location: [facility] ENDOSCOPY;  Service: Endoscopy;  Laterality: N/A;  . kidney repair  1985   repaired torn blood vessel in kidney    There were no vitals filed for this visit.  Subjective Assessment - 10/27/18 1105    Subjective  Patient reports increased stiffness in BLE after last session; denies any pain currently; Reports that she is still walking better at home overall;    Pertinent History  68 yo Female reports instability with gait with difficulty walking. She states, "my left leg gives me trouble. It will  just plant and then I can't pick it  up." Pt reports 2 falls in last month. She reports when she falls it is often falling backwards; She was diagnosed with hydrocephalus and did a drain trial with no relief; She did not qualify for a shunt. Repeat imaging showed continued hydrocephalus that has not worsened; MRI of lumbar spine shows L5 pars defect with sacral insufficiency; She denies any back pain and reports no numbness in legs. She reports feeling frustration because they can't seem to find what's wrong with her. She does have urinary incontience; reports knowing when she has to go to the bathroom but can't get there fast enough; Patient has a walker and rollator; She uses both; She also has a manual wheelchair that she uses intermittently;     Limitations  Standing;Walking    How long can you sit comfortably?  NA    How long can you stand comfortably?  has pain in LLE with prolonged stooping; <10 min;     How long can you walk comfortably?  200 feet with assistance requiring several breaks;     Diagnostic tests  has had MRI and brain imaging;     Patient Stated Goals  "Be able to travel more, little sight seeing; walk short distances" Patient reports that she would like to be able to get rid of the walker.     Currently in Pain?  No/denies  Pain Onset  In the past 7 days    Multiple Pain Sites  No             TREATMENT:  Patient prone: Instructed patient in alternate hamstring curl AROM2#x15reps bilaterally; Passive quad stretch 20 sec hold x2 reps bilaterally with towel roll under knee for better iliopsoas stretch; Instructed patient in hip extension SLR,2#,2x12reps bilaterally; Instructed patient in gluteal max hip extension (knee flexed)3#x15reps bilaterally; Patient required min-moderate verbal/tactile cues for correct exercise techniquefor optimum muscle activation and strengthening;Patient required AAROM to achieve better ROM as patient very weak in hip extension; She exhibits decreased ROM on RLE but  that improved with increased repetition;  Tall kneeling, squat to heel and then come to tall kneeling hip extension x10 reps unsupported; Required min A for balance/safety;   Qped: Hip extension 2# x10 each LE with min A for positioning and min VCs to increase core stabilization and avoid trunk rotation for better hip extension;   Patient hooklying: Modified piriformis stretch 20 sec hold x2 reps bilaterally ; required min VCS for proper positioning to improve hip flexibility; Hamstring stretch with ankle DF x10 reps for neural flossing;   NMR: Patient in 1/2 kneeling, unsupported, arms across chest trunk rotation x5 reps each direction with each foot in front; required min A for safety;Patient also required 1 HHA to get into 1/2 kneeling position. She required mod Vcs to improve weight shift and increase upper trunk control for better balance;  PT instructed patient in standing on RLE, LLE hip flexion march x15reps, x2 sets with cues for weight shift and to avoid falling to LLE for better foot clearance; Patient able to exhibit better weight shift with increased repetition with less heavy step with LLE; utilized mirror for visual cues for better foot clearance;  Weaving around cones #6 x2 laps unsupported with min VCs to increase step length and improve turning for better obstacle negotiation;   Side stepping over cones #6 x1 set each direction with min-mod A for safety and mod VCs for weight shift and to increase hip flexion/step length for better foot clearance; patient unable to fully clear foot and often steps around cones due to difficulty clearing obstacles. With increased repetition patient able to increase step length and exhibits better foot clearance;    Gait overground, level surfacex80 feet with supervision with patient exhibiting improved LLE heel strike and improved reciprocal gait pattern with less left foot drag;She ambulates with better base of support and less  lateral lean exhibiting improved gait ability;    Response to treatment:Patient continues to demonstrate excellent motivation for therapy.Pt was able to exhibit better base of support with less widened steps and improved step length progressing to reciprocal pattern. Patient does better with increased repetition with improved motor planning and improved step position.She is exhibiting improved bed mobility having less difficulty getting into prone/qped position; She does reports mild fatigue at end of session;                     PT Education - 10/27/18 1106    Education Details  strengthening/ROM/ HEP/balance/gait safety;    Person(s) Educated  Patient    Methods  Explanation;Verbal cues    Comprehension  Verbalized understanding;Returned demonstration;Verbal cues required;Need further instruction       PT Short Term Goals - 09/23/18 1149      PT SHORT TERM GOAL #1   Title  Patient will be adherent to HEP at least 3x a week  to improve functional strength and balance for better safety at home.    Baseline  08/12/18- doing exercise daily;    Time  4    Period  Weeks    Status  Achieved    Target Date  08/17/18      PT SHORT TERM GOAL #2   Title   Patient will deny any falls over past 4 weeks to demonstrate improved safety awareness at home and work.     Baseline  08/12/18- fell 1 week ago; 09/23/18: none recent;    Time  4    Period  Weeks    Status  Achieved    Target Date  08/17/18        PT Long Term Goals - 10/21/18 1221      PT LONG TERM GOAL #1   Title  Patient will increase Berg Balance score by > 6 points to demonstrate decreased fall risk during functional activities.    Time  8    Period  Weeks    Status  Achieved      PT LONG TERM GOAL #2   Title  Patient will increase 10 meter walk test to >0.8 m/s as to improve gait speed for better community ambulation and to reduce fall risk.    Time  8    Period  Weeks    Status  Partially Met     Target Date  12/16/18      PT LONG TERM GOAL #3   Title  Patient will be require no assist with ascend/descend 3 steps using Least restrictive assistive device to improve safety with entry/exit home.     Time  8    Period  Weeks    Status  Partially Met    Target Date  12/16/18      PT LONG TERM GOAL #4   Title  Patient will increase BLE gross strength to 4+/5 as to improve functional strength for independent gait, increased standing tolerance and increased ADL ability.    Time  8    Period  Weeks    Status  Partially Met    Target Date  12/16/18            Plan - 10/27/18 1123    Clinical Impression Statement  Patient motivated and participated well; reduced ankle weight with prone exercise to facilitate improved AROM with less resistance. Patient does fatigue quickly with advanced exercise. However she is improving in strength, being able to exhibit improved ROM and being able to progress to qped activity; Patient continues to require cues for weight shift to facilitate better LLE foot clearance; She would benefit from additional skilled PT intervention to improve strength, balance and gait safety; Patient did have increased swelling in BLE today; Educated patient in compression socks for swelling reduction;    Rehab Potential  Good    PT Frequency  2x / week    PT Duration  8 weeks    PT Treatment/Interventions  Cryotherapy;Electrical Stimulation;Moist Heat;Gait training;Stair training;Functional mobility training;Therapeutic activities;Therapeutic exercise;Balance training;Neuromuscular re-education;Patient/family education;Energy conservation    PT Next Visit Plan  Continue to work on balance activities and strengthening at next visit    PT [place]  Reviewed tandem stance and SLS at [place] with pt at today's visit.    Consulted and Agree with Plan of Care  Patient       Patient will benefit from skilled therapeutic intervention in order to improve the  following deficits and  impairments:  Abnormal gait, Decreased balance, Decreased endurance, Decreased mobility, Difficulty walking, Impaired perceived functional ability, Decreased activity tolerance, Decreased safety awareness  Visit Diagnosis: Unsteadiness on feet  Difficulty in walking, not elsewhere classified  Muscle weakness (generalized)     Problem List Patient Active Problem List   Diagnosis Date Noted  . NPH (normal pressure hydrocephalus) ([place]) 07/30/2016  . Left leg weakness 04/20/2016  . Menopausal osteoporosis 03/04/2016  . Abnormal gait 12/18/2015  . Left foot drop 12/18/2015  . BP (high blood pressure) 08/24/2015  . Hydronephrosis, left 07/28/2015  . UPJ (ureteropelvic junction) obstruction 07/28/2015  . Lower urinary tract infectious disease 07/14/2015  . Hydronephrosis 07/14/2015    Trotter,Margaret PT, DPT 10/27/2018, 12:53 PM  [place] MAIN [facility] SERVICES [address] [place], [place], [address] Phone: [phone]   Fax:  [phone]  Name: Abigail Hill MRN: [id number] Date of Birth: Feb 24, 1951

## 2018-11-01 ENCOUNTER — Ambulatory Visit: Payer: PPO | Admitting: Physical Therapy

## 2018-11-01 ENCOUNTER — Encounter: Payer: Self-pay | Admitting: Physical Therapy

## 2018-11-01 ENCOUNTER — Other Ambulatory Visit: Payer: Self-pay

## 2018-11-01 DIAGNOSIS — R262 Difficulty in walking, not elsewhere classified: Secondary | ICD-10-CM

## 2018-11-01 DIAGNOSIS — R2681 Unsteadiness on feet: Secondary | ICD-10-CM

## 2018-11-01 DIAGNOSIS — M6281 Muscle weakness (generalized): Secondary | ICD-10-CM

## 2018-11-01 NOTE — Therapy (Signed)
[place] [address] [place], [place], [address] Phone: [phone]   Fax:  [phone]  Physical Therapy Treatment  Patient Details  Name: Abigail Hill MRN: [id number] Date of Birth: February 16, 1951 Referring Provider (PT): Dr. Metta Clines   Encounter Date: 11/01/2018  PT End of Session - 11/01/18 1203    Visit Number  28    Number of Visits  46    Date for PT Re-Evaluation  12/16/18    Authorization Type  goals addressed 09/23/18,    PT Start Time  1103    PT Stop Time  1145    PT Time Calculation (min)  42 min    Equipment Utilized During Treatment  Gait belt    Activity Tolerance  Patient limited by fatigue;Patient tolerated treatment well    Behavior During Therapy  [facility] for tasks assessed/performed       Past Medical History:  Diagnosis Date  . Abnormal gait 12/18/2015  . BP (high blood pressure) 08/24/2015  . Cancer ([place])    skin  . Constriction of ureter (postoperative) 07/28/2015  . Hydronephrosis 07/14/2015  . Hypertension   . Left foot drop 12/18/2015  . Recurrent UTI   . UPJ (ureteropelvic junction) obstruction 07/28/2015  . UTI (lower urinary tract infection) 07/14/2015    Past Surgical History:  Procedure Laterality Date  . broken foot  2012  . COLONOSCOPY WITH PROPOFOL N/A 06/30/2017   Procedure: COLONOSCOPY WITH PROPOFOL;  Surgeon: Lollie Sails, MD;  Location: [facility] ENDOSCOPY;  Service: Endoscopy;  Laterality: N/A;  . kidney repair  1985   repaired torn blood vessel in kidney    There were no vitals filed for this visit.  Subjective Assessment - 11/01/18 1112    Subjective  Patient reports taking a car trip this weekend and has increased stiffness; denies any new falls;    Pertinent History  68 yo Female reports instability with gait with difficulty walking. She states, "my left leg gives me trouble. It will  just plant and then I can't pick it up." Pt reports 2 falls in last month. She reports  when she falls it is often falling backwards; She was diagnosed with hydrocephalus and did a drain trial with no relief; She did not qualify for a shunt. Repeat imaging showed continued hydrocephalus that has not worsened; MRI of lumbar spine shows L5 pars defect with sacral insufficiency; She denies any back pain and reports no numbness in legs. She reports feeling frustration because they can't seem to find what's wrong with her. She does have urinary incontience; reports knowing when she has to go to the bathroom but can't get there fast enough; Patient has a walker and rollator; She uses both; She also has a manual wheelchair that she uses intermittently;     Limitations  Standing;Walking    How long can you sit comfortably?  NA    How long can you stand comfortably?  has pain in LLE with prolonged stooping; <10 min;     How long can you walk comfortably?  200 feet with assistance requiring several breaks;     Diagnostic tests  has had MRI and brain imaging;     Patient Stated Goals  "Be able to travel more, little sight seeing; walk short distances" Patient reports that she would like to be able to get rid of the walker.     Currently in Pain?  No/denies    Multiple Pain Sites  No           TREATMENT:   Patient prone: Instructed patient in alternate hamstring curl AROM 2#x15 reps bilaterally; Instructed patient in hip extension SLR, 2#,  x15 reps bilaterally; Instructed patient in gluteal max hip extension (knee flexed)2# x15 reps bilaterally; Patient required min-moderate verbal/tactile cues for correct exercise technique for optimum muscle activation and strengthening; Patient required AAROM to achieve better ROM as patient very weak in hip extension; She exhibits decreased ROM on RLE but that improved with increased repetition;    Tall kneeling, squat to heel and then come to tall kneeling hip extension x10 reps unsupported; Required min A for balance/safety; Patient able to exhibit  increased ROM this visit as compare to previous sessions;    Qped: Hip extension 2# x10 each LE with min A for positioning and min VCs to increase core stabilization and avoid trunk rotation for better hip extension;    Patient hooklying: Modified piriformis stretch 20 sec hold x2 reps bilaterally ; required min VCS for proper positioning to improve hip flexibility; Hamstring stretch with ankle DF x10 reps for neural flossing;  Single knee to chest stretch 20 sec hold x1 rep bilaterally;   NMR:   Patient in 1/2 kneeling, unsupported, arms across chest trunk rotation x5 reps each direction with each foot in front; Advanced with neutral position, BUE arm overhead reach x5 reps each foot in front;  required min A for safety; Patient also required 1 HHA to get into 1/2 kneeling position. She required mod Vcs to improve weight shift and increase upper trunk control for better balance;      PT instructed patient in standing on RLE, LLE hip flexion march x15 reps, x2 sets with cues for weight shift and to avoid falling to LLE for better foot clearance; Patient able to exhibit better weight shift with increased repetition with less heavy step with LLE; utilized mirror for visual cues for better foot clearance;  Gait on treadmill 1.0 mph with 2 HHA  with mod VCs to increase hip extension, increased step length and improved ankle DF at heel strike x3-4 min; Initially patient able to exhibit improved step length and weight shift initially but then with increased time exhibits increased left foot drag with fatigue;    Response to treatment: Patient continues to demonstrate excellent motivation for therapy. Pt was able to exhibit better base of support with less widened steps and improved step length progressing to reciprocal pattern. Patient does better with increased repetition with improved motor planning and improved step position. She is exhibiting improved bed mobility having less difficulty getting into  prone/qped position;  She does reports mild fatigue at end of session; Increased stiffness noted today with decreased ROM with prone exercise;                       PT Education - 11/01/18 1203    Education Details  strengthening/ROM; HEP reinforced; gait safety;    Person(s) Educated  Patient    Methods  Explanation;Verbal cues    Comprehension  Verbalized understanding;Returned demonstration;Verbal cues required;Need further instruction       PT Short Term Goals - 09/23/18 1149      PT SHORT TERM GOAL #1   Title  Patient will be adherent to HEP at least 3x a week to improve functional strength and balance for better safety at home.    Baseline  08/12/18- doing exercise daily;    Time  4  Period  Weeks    Status  Achieved    Target Date  08/17/18      PT SHORT TERM GOAL #2   Title   Patient will deny any falls over past 4 weeks to demonstrate improved safety awareness at home and work.     Baseline  08/12/18- fell 1 week ago; 09/23/18: none recent;    Time  4    Period  Weeks    Status  Achieved    Target Date  08/17/18        PT Long Term Goals - 10/21/18 1221      PT LONG TERM GOAL #1   Title  Patient will increase Berg Balance score by > 6 points to demonstrate decreased fall risk during functional activities.    Time  8    Period  Weeks    Status  Achieved      PT LONG TERM GOAL #2   Title  Patient will increase 10 meter walk test to >0.8 m/s as to improve gait speed for better community ambulation and to reduce fall risk.    Time  8    Period  Weeks    Status  Partially Met    Target Date  12/16/18      PT LONG TERM GOAL #3   Title  Patient will be require no assist with ascend/descend 3 steps using Least restrictive assistive device to improve safety with entry/exit home.     Time  8    Period  Weeks    Status  Partially Met    Target Date  12/16/18      PT LONG TERM GOAL #4   Title  Patient will increase BLE gross strength to 4+/5 as to  improve functional strength for independent gait, increased standing tolerance and increased ADL ability.    Time  8    Period  Weeks    Status  Partially Met    Target Date  12/16/18            Plan - 11/01/18 1131    Clinical Impression Statement  Patient motivated and participated well within session; She exhibited increased stiffness in front of legs today with decreased hip extension ROM due to recent car trip; Educated patient on importance of stretches at home especially after long car trips to improve flexibility and reduce stiffness; Patient tolerated session well reporting less stiffness and exhibiting improved flexibility with gait and standing tasks. Advanced balance/gait training with gait on treadmill. patient does require min-mod VCs for sequencing and proper weight shift for better foot clearance; She would benefit from additional skilled PT intervention to improve strength, balance and mobility;    Rehab Potential  Good    PT Frequency  2x / week    PT Duration  8 weeks    PT Treatment/Interventions  Cryotherapy;Electrical Stimulation;Moist Heat;Gait training;Stair training;Functional mobility training;Therapeutic activities;Therapeutic exercise;Balance training;Neuromuscular re-education;Patient/family education;Energy conservation    PT Next Visit Plan  Continue to work on balance activities and strengthening at next visit    PT [place]  Reviewed tandem stance and SLS at [place] with pt at today's visit.    Consulted and Agree with Plan of Care  Patient       Patient will benefit from skilled therapeutic intervention in order to improve the following deficits and impairments:  Abnormal gait, Decreased balance, Decreased endurance, Decreased mobility, Difficulty walking, Impaired perceived functional ability, Decreased activity tolerance, Decreased safety awareness  Visit Diagnosis: Unsteadiness on feet  Difficulty in walking, not elsewhere  classified  Muscle weakness (generalized)     Problem List Patient Active Problem List   Diagnosis Date Noted  . NPH (normal pressure hydrocephalus) ([place]) 07/30/2016  . Left leg weakness 04/20/2016  . Menopausal osteoporosis 03/04/2016  . Abnormal gait 12/18/2015  . Left foot drop 12/18/2015  . BP (high blood pressure) 08/24/2015  . Hydronephrosis, left 07/28/2015  . UPJ (ureteropelvic junction) obstruction 07/28/2015  . Lower urinary tract infectious disease 07/14/2015  . Hydronephrosis 07/14/2015    Meghna Hagmann PT, DPT 11/01/2018, 12:04 PM  [place] MAIN [facility] SERVICES [address] [place], [place], [address] Phone: [phone]   Fax:  [phone]  Name: URIEL HORKEY MRN: [id number] Date of Birth: 03-03-1951

## 2018-11-03 ENCOUNTER — Other Ambulatory Visit: Payer: Self-pay

## 2018-11-03 ENCOUNTER — Ambulatory Visit: Payer: PPO | Attending: Neurology | Admitting: Physical Therapy

## 2018-11-03 ENCOUNTER — Encounter: Payer: Self-pay | Admitting: Physical Therapy

## 2018-11-03 DIAGNOSIS — M6281 Muscle weakness (generalized): Secondary | ICD-10-CM | POA: Insufficient documentation

## 2018-11-03 DIAGNOSIS — R29898 Other symptoms and signs involving the musculoskeletal system: Secondary | ICD-10-CM | POA: Insufficient documentation

## 2018-11-03 DIAGNOSIS — R269 Unspecified abnormalities of gait and mobility: Secondary | ICD-10-CM | POA: Diagnosis not present

## 2018-11-03 DIAGNOSIS — R262 Difficulty in walking, not elsewhere classified: Secondary | ICD-10-CM | POA: Diagnosis not present

## 2018-11-03 DIAGNOSIS — R2681 Unsteadiness on feet: Secondary | ICD-10-CM | POA: Diagnosis not present

## 2018-11-03 NOTE — Therapy (Signed)
[place] MAIN [facility] SERVICES [address] [place], [place], [address] Phone: [phone]   Fax:  [phone]  Physical Therapy Treatment  Patient Details  Name: Abigail Hill MRN: [id number] Date of Birth: 1950-07-15 Referring Provider (PT): Dr. Metta Clines   Encounter Date: 11/03/2018  PT End of Session - 11/03/18 1109    Visit Number  29    Number of Visits  46    Date for PT Re-Evaluation  12/16/18    Authorization Type  goals addressed 09/23/18,    PT Start Time  1102    PT Stop Time  1145    PT Time Calculation (min)  43 min    Equipment Utilized During Treatment  Gait belt    Activity Tolerance  Patient limited by fatigue;Patient tolerated treatment well    Behavior During Therapy  [facility] for tasks assessed/performed       Past Medical History:  Diagnosis Date  . Abnormal gait 12/18/2015  . BP (high blood pressure) 08/24/2015  . Cancer ([place])    skin  . Constriction of ureter (postoperative) 07/28/2015  . Hydronephrosis 07/14/2015  . Hypertension   . Left foot drop 12/18/2015  . Recurrent UTI   . UPJ (ureteropelvic junction) obstruction 07/28/2015  . UTI (lower urinary tract infection) 07/14/2015    Past Surgical History:  Procedure Laterality Date  . broken foot  2012  . COLONOSCOPY WITH PROPOFOL N/A 06/30/2017   Procedure: COLONOSCOPY WITH PROPOFOL;  Surgeon: Lollie Sails, MD;  Location: [facility] ENDOSCOPY;  Service: Endoscopy;  Laterality: N/A;  . kidney repair  1985   repaired torn blood vessel in kidney    There were no vitals filed for this visit.  Subjective Assessment - 11/03/18 1108    Subjective  Patient reports doing well; She presents to therapy with less swelling in both legs. She reports walking better with less swelling; No soreness or new falls reported;    Pertinent History  68 yo Female reports instability with gait with difficulty walking. She states, "my left leg gives me trouble. It will  just plant and  then I can't pick it up." Pt reports 2 falls in last month. She reports when she falls it is often falling backwards; She was diagnosed with hydrocephalus and did a drain trial with no relief; She did not qualify for a shunt. Repeat imaging showed continued hydrocephalus that has not worsened; MRI of lumbar spine shows L5 pars defect with sacral insufficiency; She denies any back pain and reports no numbness in legs. She reports feeling frustration because they can't seem to find what's wrong with her. She does have urinary incontience; reports knowing when she has to go to the bathroom but can't get there fast enough; Patient has a walker and rollator; She uses both; She also has a manual wheelchair that she uses intermittently;     Limitations  Standing;Walking    How long can you sit comfortably?  NA    How long can you stand comfortably?  has pain in LLE with prolonged stooping; <10 min;     How long can you walk comfortably?  200 feet with assistance requiring several breaks;     Diagnostic tests  has had MRI and brain imaging;     Patient Stated Goals  "Be able to travel more, little sight seeing; walk short distances" Patient reports that she would like to be able to get rid of the walker.     Currently  in Pain?  No/denies    Multiple Pain Sites  No              TREATMENT:  Patient prone: Instructed patient in alternate hamstring curl AROM2#x15reps bilaterally; Instructed patient in hip extension SLR,2#,x15reps bilaterally; Instructed patient in gluteal max hip extension (knee flexed)2#x10reps bilaterally; Patient required min-moderate verbal/tactile cues for correct exercise techniquefor optimum muscle activation and strengthening;Patient required AAROM to achieve better ROM as patient very weak in hip extension; She exhibits decreased ROM on RLE but that improved with increased repetition;  Tall kneeling, squat to heel and then come to tall kneeling hip extension x10  reps unsupported;Required min A for balance/safety;Patient able to exhibit increased ROM this visit as compare to previous sessions;   Qped: Hip extension 2# x10 each LE with min A for positioning and min VCs to increase core stabilization and avoid trunk rotation for better hip extension;    NMR: Patient in 1/2 kneeling, unsupported, arms across chest trunk rotation x5 reps each direction with each foot in front; required min A for safety;Patient also required 1 HHA to get into 1/2 kneeling position. She required mod Vcs to improve weight shift and increase upper trunk control for better balance;    PT instructed patient in standing on RLE, LLE hip flexion march x15reps, x2 setswith cues for weight shift and to avoid falling to LLE for better foot clearance; Patient able to exhibit better weight shift with increased repetition with less heavy step with LLE; utilized mirror for visual cues for better foot clearance;  Instructed patient in multiple directional stepping with LLE: forward, side, backward while stepping back to midline in between x5 reps each with CGA to close supervision; Patient required cues for proper weight shift. She was able to take a large step but had significant difficulty stepping back to midline; Advanced HEP with exercise to challenge dynamic balance control;  Resisted walking 12.5# forward/backward x2 laps with min A for safety; patient able to exhibit better weight shift with reciprocal stepping when walking forward. She was able to exhibit better step length walking backwards but often fatigues with short shuffled steps after 1/2 distance.   Side stepping in front of wall with finger tip tap overhead to challenge lateral weight shift x3 steps each direction x5 sets with each UE: Required close supervision; initially exhibits decreased weight shift but with increased repetition able to exhibit improved weight shift with all weight on stance leg and lifting  contralateral leg for improved reach;   Response to treatment:Patient continues to demonstrate excellent motivation for therapy.Pt was able to exhibit better base of support with less widened steps and improved step length progressing to reciprocal pattern. Patient does better with increased repetition with improved motor planning and improved step position.She is exhibiting improved bed mobility having less difficulty getting into prone/qpedposition; She doesreports mildfatigue at end of session;Increased stiffness noted today with decreased ROM with prone exercise;                           PT Education - 11/03/18 1109    Education Details  strengthening/ROM; HEP reinforced; gait safety;    Person(s) Educated  Patient    Methods  Explanation;Verbal cues    Comprehension  Verbalized understanding;Returned demonstration;Verbal cues required;Need further instruction       PT Short Term Goals - 09/23/18 1149      PT SHORT TERM GOAL #1   Title  Patient will be  adherent to HEP at least 3x a week to improve functional strength and balance for better safety at home.    Baseline  08/12/18- doing exercise daily;    Time  4    Period  Weeks    Status  Achieved    Target Date  08/17/18      PT SHORT TERM GOAL #2   Title   Patient will deny any falls over past 4 weeks to demonstrate improved safety awareness at home and work.     Baseline  08/12/18- fell 1 week ago; 09/23/18: none recent;    Time  4    Period  Weeks    Status  Achieved    Target Date  08/17/18        PT Long Term Goals - 10/21/18 1221      PT LONG TERM GOAL #1   Title  Patient will increase Berg Balance score by > 6 points to demonstrate decreased fall risk during functional activities.    Time  8    Period  Weeks    Status  Achieved      PT LONG TERM GOAL #2   Title  Patient will increase 10 meter walk test to >0.8 m/s as to improve gait speed for better community ambulation and to  reduce fall risk.    Time  8    Period  Weeks    Status  Partially Met    Target Date  12/16/18      PT LONG TERM GOAL #3   Title  Patient will be require no assist with ascend/descend 3 steps using Least restrictive assistive device to improve safety with entry/exit home.     Time  8    Period  Weeks    Status  Partially Met    Target Date  12/16/18      PT LONG TERM GOAL #4   Title  Patient will increase BLE gross strength to 4+/5 as to improve functional strength for independent gait, increased standing tolerance and increased ADL ability.    Time  8    Period  Weeks    Status  Partially Met    Target Date  12/16/18            Plan - 11/03/18 1216    Clinical Impression Statement  Patient motivated and participated well within session; advanced HEP with prone and qped exercise to improve hip strengthening; patient continues to require cues for proper positioning and AAROM to achieve full ROM. She is able to transition from prone to qped well with less assistance. Patient instructed in advanced balance tasks. She continues to require cues for weight shift to RLE for better dynamic balance control and improved LLE foot clearance. Advanced HEP with multiple directional stepping. Patient would benefit from additional skilled PT intervention to improve strength, balance and gait safety;    Rehab Potential  Good    PT Frequency  2x / week    PT Duration  8 weeks    PT Treatment/Interventions  Cryotherapy;Electrical Stimulation;Moist Heat;Gait training;Stair training;Functional mobility training;Therapeutic activities;Therapeutic exercise;Balance training;Neuromuscular re-education;Patient/family education;Energy conservation    PT Next Visit Plan  Continue to work on balance activities and strengthening at next visit    PT [place]  Reviewed tandem stance and SLS at [place] with pt at today's visit.    Consulted and Agree with Plan of Care  Patient       Patient  will benefit from skilled  therapeutic intervention in order to improve the following deficits and impairments:  Abnormal gait, Decreased balance, Decreased endurance, Decreased mobility, Difficulty walking, Impaired perceived functional ability, Decreased activity tolerance, Decreased safety awareness  Visit Diagnosis: Unsteadiness on feet  Difficulty in walking, not elsewhere classified  Muscle weakness (generalized)     Problem List Patient Active Problem List   Diagnosis Date Noted  . NPH (normal pressure hydrocephalus) ([place]) 07/30/2016  . Left leg weakness 04/20/2016  . Menopausal osteoporosis 03/04/2016  . Abnormal gait 12/18/2015  . Left foot drop 12/18/2015  . BP (high blood pressure) 08/24/2015  . Hydronephrosis, left 07/28/2015  . UPJ (ureteropelvic junction) obstruction 07/28/2015  . Lower urinary tract infectious disease 07/14/2015  . Hydronephrosis 07/14/2015    Trotter,Margaret PT, DPT 11/03/2018, 12:17 PM  [place] MAIN [facility] SERVICES [address] [place], [place], [address] Phone: [phone]   Fax:  [phone]  Name: TANAYSIA BHARDWAJ MRN: [id number] Date of Birth: [birth year]

## 2018-11-03 NOTE — Patient Instructions (Addendum)
Access Code: [email or web address]  URL: [email or web address]  Date: 11/03/2018  Prepared by: Blanche East   Exercises  Prone Knee Flexion - 15 reps - 2 sets - 1x daily - 7x weekly  Prone Hip Extension - 15 reps - 2 sets - 1x daily - 7x weekly  Hip Extension with Leg Bent - 15 reps - 2 sets - 1x daily - 7x weekly  Quadruped Alternating Leg Extensions - 10 reps - 2 sets - 1x daily - 7x weekly  Heel Sits - 10 reps - 3 sets - 1x daily - 7x weekly  Half-Kneeling Trunk Rotation - 5 reps - 2 sets - 1x daily - 7x weekly  Access Code: [email or web address]  URL: [email or web address]  Date: 11/03/2018  Prepared by: Blanche East   Exercises  Step Forward with Mickle Plumb - 5 reps - 2 sets - 1x daily - 7x weekly

## 2018-11-10 ENCOUNTER — Encounter: Payer: Self-pay | Admitting: Physical Therapy

## 2018-11-10 ENCOUNTER — Ambulatory Visit: Payer: PPO | Admitting: Physical Therapy

## 2018-11-10 ENCOUNTER — Other Ambulatory Visit: Payer: Self-pay

## 2018-11-10 DIAGNOSIS — R2681 Unsteadiness on feet: Secondary | ICD-10-CM | POA: Diagnosis not present

## 2018-11-10 DIAGNOSIS — M6281 Muscle weakness (generalized): Secondary | ICD-10-CM

## 2018-11-10 DIAGNOSIS — R262 Difficulty in walking, not elsewhere classified: Secondary | ICD-10-CM

## 2018-11-10 NOTE — Therapy (Signed)
[place] MAIN [facility] SERVICES [address] [place], [place], [address] Phone: [phone]   Fax:  [phone]  Physical Therapy Treatment Physical Therapy Progress Note   Dates of reporting period  09/23/18   to   11/10/18   Patient Details  Name: Abigail Hill MRN: [id number] Date of Birth: [birth year] Referring Provider (PT): Dr. Metta Clines   Encounter Date: 11/10/2018  PT End of Session - 11/10/18 1108    Visit Number  30    Number of Visits  46    Date for PT Re-Evaluation  12/16/18    Authorization Type  goals addressed 09/23/18,    PT Start Time  1102    PT Stop Time  1145    PT Time Calculation (min)  43 min    Equipment Utilized During Treatment  Gait belt    Activity Tolerance  Patient limited by fatigue;Patient tolerated treatment well    Behavior During Therapy  [facility] for tasks assessed/performed       Past Medical History:  Diagnosis Date  . Abnormal gait 12/18/2015  . BP (high blood pressure) 08/24/2015  . Cancer ([place])    skin  . Constriction of ureter (postoperative) 07/28/2015  . Hydronephrosis 07/14/2015  . Hypertension   . Left foot drop 12/18/2015  . Recurrent UTI   . UPJ (ureteropelvic junction) obstruction 07/28/2015  . UTI (lower urinary tract infection) 07/14/2015    Past Surgical History:  Procedure Laterality Date  . broken foot  2012  . COLONOSCOPY WITH PROPOFOL N/A 06/30/2017   Procedure: COLONOSCOPY WITH PROPOFOL;  Surgeon: Lollie Sails, MD;  Location: [facility] ENDOSCOPY;  Service: Endoscopy;  Laterality: N/A;  . kidney repair  1985   repaired torn blood vessel in kidney    There were no vitals filed for this visit.  Subjective Assessment - 11/10/18 1104    Subjective  Patient reports working on HEP with some difficulty; She reports still wobbling with qped exercise; denies any new falls;    Pertinent History  68 yo Female reports instability with gait with difficulty walking. She states, "my left leg  gives me trouble. It will  just plant and then I can't pick it up." Pt reports 2 falls in last month. She reports when she falls it is often falling backwards; She was diagnosed with hydrocephalus and did a drain trial with no relief; She did not qualify for a shunt. Repeat imaging showed continued hydrocephalus that has not worsened; MRI of lumbar spine shows L5 pars defect with sacral insufficiency; She denies any back pain and reports no numbness in legs. She reports feeling frustration because they can't seem to find what's wrong with her. She does have urinary incontience; reports knowing when she has to go to the bathroom but can't get there fast enough; Patient has a walker and rollator; She uses both; She also has a manual wheelchair that she uses intermittently;     Limitations  Standing;Walking    How long can you sit comfortably?  NA    How long can you stand comfortably?  has pain in LLE with prolonged stooping; <10 min;     How long can you walk comfortably?  200 feet with assistance requiring several breaks;     Diagnostic tests  has had MRI and brain imaging;     Patient Stated Goals  "Be able to travel more, little sight seeing; walk short distances" Patient reports that she would like to be able  to get rid of the walker.     Currently in Pain?  No/denies    Multiple Pain Sites  No           TREATMENT:  Patient prone: Instructed patient in alternate hamstring curl AROM x10 reps bilaterally; Instructed patient in hip extension SLR,2#,x10reps bilaterally; Instructed patient in gluteal max hip extension (knee flexed)2#x10reps bilaterally; Patient required min-moderate verbal/tactile cues for correct exercise techniquefor optimum muscle activation and strengthening;Patient required AAROM to achieve better ROM as patient very weak in hip extension; She exhibits decreased ROM on RLE but that improved with increased repetition;  Tall kneeling, squat to heel and then come  to tall kneeling hip extension x10 reps unsupported;Required min A for balance/safety;Patient able to exhibit increased ROM this visit as compare to previous sessions;  Qped: Alternate UE/LE lift x5 reps each with min A for positioning and mod VCs to increase core stabilization and avoid trunk rotation for better hip extension and proper sequencing   NMR: Patient in 1/2 kneeling, unsupported, holding ball with trunk rotation x5 reps each direction with each foot in front;required min A for safety;Patient also required 1 HHA to get into 1/2 kneeling position. She required mod Vcs to improve weight shift and increase upper trunk control for better balance;  Standing feet apart, side/side weight shift to facilitate increased RLE weight shift for better foot clearance on LLE x10 reps;   PT instructed patient in standing on RLE, LLE hip flexion march x15reps, x2 setswith cues for weight shift and to avoid falling to LLE for better foot clearance; Patient able to exhibit better weight shift with increased repetition with less heavy step with LLE; utilized mirror for visual cues for better foot clearance;  Patient in mid stance positioning:  Forward/backward weight shift with cues for knee extension and hip extension during forward weight shift for improved gluteal activation x5 reps x3 sets each foot in front; She initially required rail assist for safety and mod Vcs for proper positioning; Patient exhibits increased fear avoidance with positioning having difficulty with taking larger step due to fear of falling;   Instructed patient in 10 meter walk/assessed strength, see above;     Response to treatment:Patient continues to demonstrate excellent motivation for therapy.She did have increased difficulty this session with decreased weight shift and increased fear avoidance during dynamic balance tasks. Patient is able to walk short distances without AD. She was walking slightly  slower this session compared to previous sessions, which is likely due to increased fear of falling. Patient's condition has the potential to improve in response to therapy. Maximum improvement is yet to be obtained. The anticipated improvement is attainable and reasonable in a generally predictable time.  Patient reports adherence with HEP. She does state that she felt off today with feeling more fearful of falling.                        PT Education - 11/10/18 1106    Education Details  strengthening/ROM, HEP reinforced; gait safety;    Person(s) Educated  Patient    Methods  Explanation;Verbal cues    Comprehension  Verbalized understanding;Returned demonstration;Verbal cues required;Need further instruction       PT Short Term Goals - 09/23/18 1149      PT SHORT TERM GOAL #1   Title  Patient will be adherent to HEP at least 3x a week to improve functional strength and balance for better safety at home.  Baseline  08/12/18- doing exercise daily;    Time  4    Period  Weeks    Status  Achieved    Target Date  08/17/18      PT SHORT TERM GOAL #2   Title   Patient will deny any falls over past 4 weeks to demonstrate improved safety awareness at home and work.     Baseline  08/12/18- fell 1 week ago; 09/23/18: none recent;    Time  4    Period  Weeks    Status  Achieved    Target Date  08/17/18        PT Long Term Goals - 10/21/18 1221      PT LONG TERM GOAL #1   Title  Patient will increase Berg Balance score by > 6 points to demonstrate decreased fall risk during functional activities.    Time  8    Period  Weeks    Status  Achieved      PT LONG TERM GOAL #2   Title  Patient will increase 10 meter walk test to >0.8 m/s as to improve gait speed for better community ambulation and to reduce fall risk.    Time  8    Period  Weeks    Status  Partially Met    Target Date  12/16/18      PT LONG TERM GOAL #3   Title  Patient will be require no assist  with ascend/descend 3 steps using Least restrictive assistive device to improve safety with entry/exit home.     Time  8    Period  Weeks    Status  Partially Met    Target Date  12/16/18      PT LONG TERM GOAL #4   Title  Patient will increase BLE gross strength to 4+/5 as to improve functional strength for independent gait, increased standing tolerance and increased ADL ability.    Time  8    Period  Weeks    Status  Partially Met    Target Date  12/16/18              Patient will benefit from skilled therapeutic intervention in order to improve the following deficits and impairments:     Visit Diagnosis: Unsteadiness on feet  Difficulty in walking, not elsewhere classified  Muscle weakness (generalized)     Problem List Patient Active Problem List   Diagnosis Date Noted  . NPH (normal pressure hydrocephalus) ([place]) 07/30/2016  . Left leg weakness 04/20/2016  . Menopausal osteoporosis 03/04/2016  . Abnormal gait 12/18/2015  . Left foot drop 12/18/2015  . BP (high blood pressure) 08/24/2015  . Hydronephrosis, left 07/28/2015  . UPJ (ureteropelvic junction) obstruction 07/28/2015  . Lower urinary tract infectious disease 07/14/2015  . Hydronephrosis 07/14/2015    Trotter,Margaret PT, DPT 11/10/2018, 11:09 AM  [place] MAIN [facility] SERVICES [address] [place], [place], [address] Phone: [phone]   Fax:  [phone]  Name: Abigail Hill MRN: [id number] Date of Birth: 01-24-1951

## 2018-11-18 ENCOUNTER — Ambulatory Visit: Payer: PPO | Admitting: Physical Therapy

## 2018-11-18 ENCOUNTER — Encounter: Payer: Self-pay | Admitting: Physical Therapy

## 2018-11-18 ENCOUNTER — Other Ambulatory Visit: Payer: Self-pay

## 2018-11-18 DIAGNOSIS — R2681 Unsteadiness on feet: Secondary | ICD-10-CM | POA: Diagnosis not present

## 2018-11-18 DIAGNOSIS — R262 Difficulty in walking, not elsewhere classified: Secondary | ICD-10-CM

## 2018-11-18 DIAGNOSIS — M6281 Muscle weakness (generalized): Secondary | ICD-10-CM

## 2018-11-18 NOTE — Therapy (Signed)
[place] MAIN [facility] SERVICES [address] [place], [place], [address] Phone: [phone]   Fax:  [phone]  Physical Therapy Treatment  Patient Details  Name: Abigail Hill MRN: [id number] Date of Birth: 09-18-1950 Referring Provider (PT): Dr. Metta Clines   Encounter Date: 11/18/2018  PT End of Session - 11/18/18 1150    Visit Number  31    Number of Visits  46    Date for PT Re-Evaluation  12/16/18    Authorization Type  goals addressed 09/23/18,    PT Start Time  1146    PT Stop Time  1230    PT Time Calculation (min)  44 min    Equipment Utilized During Treatment  Gait belt    Activity Tolerance  Patient limited by fatigue;Patient tolerated treatment well    Behavior During Therapy  [facility] for tasks assessed/performed       Past Medical History:  Diagnosis Date  . Abnormal gait 12/18/2015  . BP (high blood pressure) 08/24/2015  . Cancer ([place])    skin  . Constriction of ureter (postoperative) 07/28/2015  . Hydronephrosis 07/14/2015  . Hypertension   . Left foot drop 12/18/2015  . Recurrent UTI   . UPJ (ureteropelvic junction) obstruction 07/28/2015  . UTI (lower urinary tract infection) 07/14/2015    Past Surgical History:  Procedure Laterality Date  . broken foot  2012  . COLONOSCOPY WITH PROPOFOL N/A 06/30/2017   Procedure: COLONOSCOPY WITH PROPOFOL;  Surgeon: Lollie Sails, MD;  Location: [facility] ENDOSCOPY;  Service: Endoscopy;  Laterality: N/A;  . kidney repair  1985   repaired torn blood vessel in kidney    There were no vitals filed for this visit.  Subjective Assessment - 11/18/18 1149    Subjective  Patient reports doing well today. She reports experiencing 1 fall in the last week stating that she started falling and was unable to recover; She was able to get up by herself and denies any soreness or injury from falling.    Pertinent History  68 yo Female reports instability with gait with difficulty walking. She states,  "my left leg gives me trouble. It will  just plant and then I can't pick it up." Pt reports 2 falls in last month. She reports when she falls it is often falling backwards; She was diagnosed with hydrocephalus and did a drain trial with no relief; She did not qualify for a shunt. Repeat imaging showed continued hydrocephalus that has not worsened; MRI of lumbar spine shows L5 pars defect with sacral insufficiency; She denies any back pain and reports no numbness in legs. She reports feeling frustration because they can't seem to find what's wrong with her. She does have urinary incontience; reports knowing when she has to go to the bathroom but can't get there fast enough; Patient has a walker and rollator; She uses both; She also has a manual wheelchair that she uses intermittently;     Limitations  Standing;Walking    How long can you sit comfortably?  NA    How long can you stand comfortably?  has pain in LLE with prolonged stooping; <10 min;     How long can you walk comfortably?  200 feet with assistance requiring several breaks;     Diagnostic tests  has had MRI and brain imaging;     Patient Stated Goals  "Be able to travel more, little sight seeing; walk short distances" Patient reports that she would like to  be able to get rid of the walker.     Currently in Pain?  No/denies    Multiple Pain Sites  No           TREATMENT:  Patient prone: Instructed patient in gluteal max hip extension (knee flexed)2#x10reps bilaterally; Patient required min-moderate verbal/tactile cues for correct exercise techniquefor optimum muscle activation and strengthening;Patient required AAROM to achieve better ROM as patient very weak in hip extension; Instructed patient in gluteal max hip extension AROM without resistance x10 reps bilaterally with better ROM noted;   Tall kneeling, squat to heel and then come to tall kneeling hip extension x10 reps unsupported;Required min A for  balance/safety;Patient able to exhibit increased ROM this visit as compare to previous sessions;  Qped: Alternate UE lift x10 reps bilaterally,  Alternate LE lift x10 reps bilaterally; Alternate UE/LE combined lift x5 reps each with min A for positioning and mod VCs to increase core stabilization and avoid trunk rotation for better hip extension and proper sequencing; Patient had significant difficulty shifting weight to RLE being unable to lift LLE well due to weakness and imbalance;   1/2 kneeling: Patient required min A to transition to 1/2 kneeling with increased difficulty bringing LLE forward; Patient required min A for balance control in upright position with cues for postural control and weight shift:  Holding small ball, side/side trunk rotation x5 each direction;  Transitioning tall kneeling to 1/2 kneeling with 1 HHA and mod VCs for weight shift and erect posture x5 reps; initially patient unable to lift LE into 1/2 kneeling at first but with increased repetition and cues for erect posture/hip extension to allow improved hip flexion;   NMR: Standing feet apart, side/side weight shift to facilitate increased RLE weight shift for better foot clearance on LLE x10 reps; Required mod VCs for proper positioning and weight shift;     Single leg march - cues to shift hips and shoulders together to RLE to prevent left-lean and allow LLE to be fully unweighted.   Standing in 4-square: Forward<>backward, side-step, and pivot step strategies x8 min with mod A to assist with frequent loss of balance; cues for weight shifting before and after each step to ensure stable base of support; cues to take big steps and angle foot in direction of turn for one smooth movement rather than shuffle-gait; patient was able to improve sequencing and better understood how to shift weight by end of exercise, progressing to min A with fewer episodes of loss of balance and improved confidence with negotiating  turns.  Response to treatment; Patient tolerated well. She does fatigue quickly with prone/qped exercise but was able to exhibit improved LLE movement when transitioning to 1/2 kneel. Patient requires frequent cues for proper positioning and posture for optimal muscle activation and better motor planning with dynamic tasks. She was able to exhibit improved step length and gait ability at end of session with less shuffle.                   PT Education - 11/18/18 1150    Education Details  strengthening, HEP, balance/gait safety;    Person(s) Educated  Patient    Methods  Explanation;Verbal cues    Comprehension  Verbalized understanding;Returned demonstration;Verbal cues required;Need further instruction       PT Short Term Goals - 09/23/18 1149      PT SHORT TERM GOAL #1   Title  Patient will be adherent to HEP at least 3x a week to  improve functional strength and balance for better safety at home.    Baseline  08/12/18- doing exercise daily;    Time  4    Period  Weeks    Status  Achieved    Target Date  08/17/18      PT SHORT TERM GOAL #2   Title   Patient will deny any falls over past 4 weeks to demonstrate improved safety awareness at home and work.     Baseline  08/12/18- fell 1 week ago; 09/23/18: none recent;    Time  4    Period  Weeks    Status  Achieved    Target Date  08/17/18        PT Long Term Goals - 11/10/18 1420      PT LONG TERM GOAL #1   Title  Patient will increase Berg Balance score by > 6 points to demonstrate decreased fall risk during functional activities.    Time  8    Period  Weeks    Status  Achieved      PT LONG TERM GOAL #2   Title  Patient will increase 10 meter walk test to >0.8 m/s as to improve gait speed for better community ambulation and to reduce fall risk.    Time  8    Period  Weeks    Status  Partially Met    Target Date  12/16/18      PT LONG TERM GOAL #3   Title  Patient will be require no assist with  ascend/descend 3 steps using Least restrictive assistive device to improve safety with entry/exit home.     Time  8    Period  Weeks    Status  Partially Met    Target Date  12/16/18      PT LONG TERM GOAL #4   Title  Patient will increase BLE gross strength to 4+/5 as to improve functional strength for independent gait, increased standing tolerance and increased ADL ability.    Time  8    Period  Weeks    Status  Partially Met    Target Date  12/16/18            Plan - 11/18/18 1409    Rehab Potential  Good    PT Frequency  2x / week    PT Duration  8 weeks    PT Treatment/Interventions  Cryotherapy;Electrical Stimulation;Moist Heat;Gait training;Stair training;Functional mobility training;Therapeutic activities;Therapeutic exercise;Balance training;Neuromuscular re-education;Patient/family education;Energy conservation    PT Next Visit Plan  Continue to work on balance activities and strengthening at next visit    PT [place]  Reviewed tandem stance and SLS at [place] with pt at today's visit.    Consulted and Agree with Plan of Care  Patient       Patient will benefit from skilled therapeutic intervention in order to improve the following deficits and impairments:  Abnormal gait, Decreased balance, Decreased endurance, Decreased mobility, Difficulty walking, Impaired perceived functional ability, Decreased activity tolerance, Decreased safety awareness  Visit Diagnosis: Unsteadiness on feet  Difficulty in walking, not elsewhere classified  Muscle weakness (generalized)     Problem List Patient Active Problem List   Diagnosis Date Noted  . NPH (normal pressure hydrocephalus) ([place]) 07/30/2016  . Left leg weakness 04/20/2016  . Menopausal osteoporosis 03/04/2016  . Abnormal gait 12/18/2015  . Left foot drop 12/18/2015  . BP (high blood pressure) 08/24/2015  . Hydronephrosis, left 07/28/2015  . UPJ (  ureteropelvic junction) obstruction 07/28/2015   . Lower urinary tract infectious disease 07/14/2015  . Hydronephrosis 07/14/2015    Trotter,Margaret  PT, DPT 11/18/2018, 2:10 PM  [place] MAIN [facility] SERVICES [address] [place], [place], [address] Phone: [phone]   Fax:  [phone]  Name: Abigail Hill MRN: [id number] Date of Birth: 11-11-1950

## 2018-11-23 ENCOUNTER — Encounter: Payer: Self-pay | Admitting: Physical Therapy

## 2018-11-23 ENCOUNTER — Ambulatory Visit: Payer: PPO | Admitting: Physical Therapy

## 2018-11-23 ENCOUNTER — Other Ambulatory Visit: Payer: Self-pay

## 2018-11-23 DIAGNOSIS — R262 Difficulty in walking, not elsewhere classified: Secondary | ICD-10-CM

## 2018-11-23 DIAGNOSIS — R269 Unspecified abnormalities of gait and mobility: Secondary | ICD-10-CM

## 2018-11-23 DIAGNOSIS — M6281 Muscle weakness (generalized): Secondary | ICD-10-CM

## 2018-11-23 DIAGNOSIS — R2681 Unsteadiness on feet: Secondary | ICD-10-CM | POA: Diagnosis not present

## 2018-11-23 DIAGNOSIS — R29898 Other symptoms and signs involving the musculoskeletal system: Secondary | ICD-10-CM

## 2018-11-23 NOTE — Therapy (Signed)
[place] MAIN [facility] SERVICES [address] [place], [place], [address] Phone: [phone]   Fax:  [phone]  Physical Therapy Treatment  Patient Details  Name: Abigail Hill MRN: [id number] Date of Birth: [birth year] Referring Provider (PT): Dr. Metta Clines   Encounter Date: 11/23/2018  PT End of Session - 11/23/18 1050    Visit Number  32    Number of Visits  46    Date for PT Re-Evaluation  12/16/18    Authorization Type  goals addressed 09/23/18,    PT Start Time  1100    PT Stop Time  1145    PT Time Calculation (min)  45 min    Equipment Utilized During Treatment  Gait belt    Activity Tolerance  Patient limited by fatigue;Patient tolerated treatment well    Behavior During Therapy  [facility] for tasks assessed/performed       Past Medical History:  Diagnosis Date  . Abnormal gait 12/18/2015  . BP (high blood pressure) 08/24/2015  . Cancer ([place])    skin  . Constriction of ureter (postoperative) 07/28/2015  . Hydronephrosis 07/14/2015  . Hypertension   . Left foot drop 12/18/2015  . Recurrent UTI   . UPJ (ureteropelvic junction) obstruction 07/28/2015  . UTI (lower urinary tract infection) 07/14/2015    Past Surgical History:  Procedure Laterality Date  . broken foot  2012  . COLONOSCOPY WITH PROPOFOL N/A 06/30/2017   Procedure: COLONOSCOPY WITH PROPOFOL;  Surgeon: Lollie Sails, MD;  Location: [facility] ENDOSCOPY;  Service: Endoscopy;  Laterality: N/A;  . kidney repair  1985   repaired torn blood vessel in kidney    There were no vitals filed for this visit.  Subjective Assessment - 11/23/18 1055    Subjective  Patient reports doing well today; reports a fall over the weekend when trying to enter the car, unable to get up without assistance; unable to do HEP the following day.    Pertinent History  68 yo Female reports instability with gait with difficulty walking. She states, "my left leg gives me trouble. It will  just plant and  then I can't pick it up." Pt reports 2 falls in last month. She reports when she falls it is often falling backwards; She was diagnosed with hydrocephalus and did a drain trial with no relief; She did not qualify for a shunt. Repeat imaging showed continued hydrocephalus that has not worsened; MRI of lumbar spine shows L5 pars defect with sacral insufficiency; She denies any back pain and reports no numbness in legs. She reports feeling frustration because they can't seem to find what's wrong with her. She does have urinary incontience; reports knowing when she has to go to the bathroom but can't get there fast enough; Patient has a walker and rollator; She uses both; She also has a manual wheelchair that she uses intermittently;     Limitations  Standing;Walking    How long can you sit comfortably?  NA    How long can you stand comfortably?  has pain in LLE with prolonged stooping; <10 min;     How long can you walk comfortably?  200 feet with assistance requiring several breaks;     Diagnostic tests  has had MRI and brain imaging;     Patient Stated Goals  "Be able to travel more, little sight seeing; walk short distances" Patient reports that she would like to be able to get rid of the walker.  Currently in Pain?  No/denies        TREATMENT:  Tall kneeling, squat to heel and then come to tall kneeling hip extension x10 reps unsupported;Patient able to exhibit increased ROM this visit as compare to previous sessions, requiring no assistance.  Qped: Alternate UE/LE combined lift x5 repseach with min A for positioning and modVCs to increase core stabilization and avoid trunk rotation for better hip extension and proper sequencing; Patient had less difficulty shifting weight to RLE compared to previous sessions.  1/2 kneeling:  Transitioning tall kneeling to 1/2 kneeling with 1 HHA and mod VCs for weight shift and erect posture x5 reps; Patient required min A for balance control in  upright position with cues for postural control and weight shift, more difficulty shifting weight to RLE to bring LLE forward.  NMR: Standing feet apart, side/side weight shift to facilitate increased RLE weight shift for better foot clearance on LLE x10 reps;Required mod VCs for proper positioning and weight shift, cues to lift heel of contralateral LE.    Single leg march x15 - cues to shift hips and shoulders together to RLE to prevent left-lean and allow LLE to be fully unweighted; required mod VCs and max tactile cues with patient's right LE and hip against PT to complete full weight shift, gradually decreased tactile cues until only CGA.   Step-ups with RLE on floor and LLE on first step, weight shift to RLE and toe tap left foot onto 2nd step x10, 1 HHA on rail to fingertip touch; cues to maintain upright posture.  Second set of single leg march following step ups, x10 lifting LLE; cues to maintain upright posture.  Half-stance weight shifts x5 each LE forward, no HHA, CGA; cues to maintain upright posture and to lift heel/toes during weight shift  Gait training for 50 ft around therapy gym, CGA; cues to shift hips over weight-bearing leg for symmetrical step length.  Response to treatment; Patient tolerated well. She does fatigue quickly with SLS on her RLE, requiring a rest break on the chair. Patient requires frequent cues for proper positioning and posture for optimal muscle activation and better motor planning with dynamic tasks. She was able to exhibit improved step length and gait ability at end of session with less shuffle, feeling more confident with her ability to shift weight over her RLE.      PT Education - 11/23/18 1049    Education Details  strengthening, HEP, balance/gait safety;    Person(s) Educated  Patient    Methods  Explanation;Verbal cues    Comprehension  Verbalized understanding;Returned demonstration;Verbal cues required;Need further instruction        PT Short Term Goals - 09/23/18 1149      PT SHORT TERM GOAL #1   Title  Patient will be adherent to HEP at least 3x a week to improve functional strength and balance for better safety at home.    Baseline  08/12/18- doing exercise daily;    Time  4    Period  Weeks    Status  Achieved    Target Date  08/17/18      PT SHORT TERM GOAL #2   Title   Patient will deny any falls over past 4 weeks to demonstrate improved safety awareness at home and work.     Baseline  08/12/18- fell 1 week ago; 09/23/18: none recent;    Time  4    Period  Weeks    Status  Achieved  Target Date  08/17/18        PT Long Term Goals - 11/10/18 1420      PT LONG TERM GOAL #1   Title  Patient will increase Berg Balance score by > 6 points to demonstrate decreased fall risk during functional activities.    Time  8    Period  Weeks    Status  Achieved      PT LONG TERM GOAL #2   Title  Patient will increase 10 meter walk test to >0.8 m/s as to improve gait speed for better community ambulation and to reduce fall risk.    Time  8    Period  Weeks    Status  Partially Met    Target Date  12/16/18      PT LONG TERM GOAL #3   Title  Patient will be require no assist with ascend/descend 3 steps using Least restrictive assistive device to improve safety with entry/exit home.     Time  8    Period  Weeks    Status  Partially Met    Target Date  12/16/18      PT LONG TERM GOAL #4   Title  Patient will increase BLE gross strength to 4+/5 as to improve functional strength for independent gait, increased standing tolerance and increased ADL ability.    Time  8    Period  Weeks    Status  Partially Met    Target Date  12/16/18            Plan - 11/23/18 1209    Clinical Impression Statement  Patient presented with good motivation for therapy, demonstrating improved retention of weight shift proprioception for bed exercises. Patient requires mod A verbal cues for maintianing good posture during  floor exercises and max tactile cues when initiating weight shift exercises. Continues to experience loss of balance particularly when in SLS of RLE. Patient would benefit from continued skilled PT to address the deficits presented in this note.    Rehab Potential  Good    PT Frequency  2x / week    PT Duration  8 weeks    PT Treatment/Interventions  Cryotherapy;Electrical Stimulation;Moist Heat;Gait training;Stair training;Functional mobility training;Therapeutic activities;Therapeutic exercise;Balance training;Neuromuscular re-education;Patient/family education;Energy conservation    PT Next Visit Plan  Continue to work on balance activities and strengthening at next visit    PT [place]  Reviewed tandem stance and SLS at [place] with pt at today's visit.    Consulted and Agree with Plan of Care  Patient       Patient will benefit from skilled therapeutic intervention in order to improve the following deficits and impairments:  Abnormal gait, Decreased balance, Decreased endurance, Decreased mobility, Difficulty walking, Impaired perceived functional ability, Decreased activity tolerance, Decreased safety awareness  Visit Diagnosis: Unsteadiness on feet  Difficulty in walking, not elsewhere classified  Muscle weakness (generalized)  Abnormal gait  Left leg weakness     Problem List Patient Active Problem List   Diagnosis Date Noted  . NPH (normal pressure hydrocephalus) ([place]) 07/30/2016  . Left leg weakness 04/20/2016  . Menopausal osteoporosis 03/04/2016  . Abnormal gait 12/18/2015  . Left foot drop 12/18/2015  . BP (high blood pressure) 08/24/2015  . Hydronephrosis, left 07/28/2015  . UPJ (ureteropelvic junction) obstruction 07/28/2015  . Lower urinary tract infectious disease 07/14/2015  . Hydronephrosis 07/14/2015   Jeneen Rinks A. Rosana Hoes, SPT This entire session was performed under direct supervision and direction  of a licensed Chiropractor  . I have personally read, edited and approve of the note as written.  Trotter,Margaret PT, DPT 11/23/2018, 12:41 PM  [place] MAIN [facility] SERVICES [address] [place], [place], [address] Phone: [phone]   Fax:  [phone]  Name: KIYRA SLAUBAUGH MRN: [id number] Date of Birth: Jul 29, 1950

## 2018-11-25 ENCOUNTER — Ambulatory Visit: Payer: PPO | Admitting: Physical Therapy

## 2018-11-25 ENCOUNTER — Encounter: Payer: Self-pay | Admitting: Physical Therapy

## 2018-11-25 ENCOUNTER — Other Ambulatory Visit: Payer: Self-pay

## 2018-11-25 DIAGNOSIS — M6281 Muscle weakness (generalized): Secondary | ICD-10-CM

## 2018-11-25 DIAGNOSIS — R2681 Unsteadiness on feet: Secondary | ICD-10-CM | POA: Diagnosis not present

## 2018-11-25 DIAGNOSIS — R262 Difficulty in walking, not elsewhere classified: Secondary | ICD-10-CM

## 2018-11-25 DIAGNOSIS — R269 Unspecified abnormalities of gait and mobility: Secondary | ICD-10-CM

## 2018-11-25 DIAGNOSIS — R29898 Other symptoms and signs involving the musculoskeletal system: Secondary | ICD-10-CM

## 2018-11-25 NOTE — Therapy (Signed)
[place] MAIN [facility] SERVICES [address] [place], [place], [address] Phone: [phone]   Fax:  [phone]  Physical Therapy Treatment  Patient Details  Name: Abigail Hill MRN: [id number] Date of Birth: 12-27-1950 Referring Provider (PT): Dr. Metta Clines   Encounter Date: 11/25/2018  PT End of Session - 11/25/18 1555    Visit Number  33    Number of Visits  46    Date for PT Re-Evaluation  12/16/18    Authorization Type  goals addressed 11/10/18    PT Start Time  1102    PT Stop Time  1145    PT Time Calculation (min)  43 min    Equipment Utilized During Treatment  Gait belt    Activity Tolerance  Patient limited by fatigue;Patient tolerated treatment well    Behavior During Therapy  [facility] for tasks assessed/performed       Past Medical History:  Diagnosis Date  . Abnormal gait 12/18/2015  . BP (high blood pressure) 08/24/2015  . Cancer ([place])    skin  . Constriction of ureter (postoperative) 07/28/2015  . Hydronephrosis 07/14/2015  . Hypertension   . Left foot drop 12/18/2015  . Recurrent UTI   . UPJ (ureteropelvic junction) obstruction 07/28/2015  . UTI (lower urinary tract infection) 07/14/2015    Past Surgical History:  Procedure Laterality Date  . broken foot  2012  . COLONOSCOPY WITH PROPOFOL N/A 06/30/2017   Procedure: COLONOSCOPY WITH PROPOFOL;  Surgeon: Lollie Sails, MD;  Location: [facility] ENDOSCOPY;  Service: Endoscopy;  Laterality: N/A;  . kidney repair  1985   repaired torn blood vessel in kidney    There were no vitals filed for this visit.  Subjective Assessment - 11/25/18 1101    Subjective  Patient is doing well today. Reports no new falls. Stated that she has not been compliant with HEP each day.    Pertinent History  68 yo Female reports instability with gait with difficulty walking. She states, "my left leg gives me trouble. It will  just plant and then I can't pick it up." Pt reports 2 falls in last month.  She reports when she falls it is often falling backwards; She was diagnosed with hydrocephalus and did a drain trial with no relief; She did not qualify for a shunt. Repeat imaging showed continued hydrocephalus that has not worsened; MRI of lumbar spine shows L5 pars defect with sacral insufficiency; She denies any back pain and reports no numbness in legs. She reports feeling frustration because they can't seem to find what's wrong with her. She does have urinary incontience; reports knowing when she has to go to the bathroom but can't get there fast enough; Patient has a walker and rollator; She uses both; She also has a manual wheelchair that she uses intermittently;     Limitations  Standing;Walking    How long can you sit comfortably?  NA    How long can you stand comfortably?  has pain in LLE with prolonged stooping; <10 min;     How long can you walk comfortably?  200 feet with assistance requiring several breaks;     Diagnostic tests  has had MRI and brain imaging;     Patient Stated Goals  "Be able to travel more, little sight seeing; walk short distances" Patient reports that she would like to be able to get rid of the walker.     Currently in Pain?  No/denies  Pain Score  0-No pain           TREATMENT:  Tall kneeling, squat to heel and then come to tall kneeling hip extension 2x10 reps unsupported;Patient able to exhibit increased ROM this visit as compare to previous sessions, requiring tactile cues for shifting weigh evenly   1/2 kneeling:  Transitioning tall kneeling to 1/2 kneeling with 1 HHA and mod VCs for weight shift and erect posture x5 reps; Patient required min A for balance control in upright position with cues for postural control and weight shift.  NMR: Standing feet apart, side/side weight shift to facilitate increased RLE weight shift for better foot clearance on LLE x7 min;Required mod VCs and max tactile cues with PT against patient's right hip and  shoulder for proper positioning and weight shift, cues to lift heel of contralateral LE; gradually reduced tactile cues to mod.   Step-ups with RLE on floor and LLE on first step, weight shift to RLE and toe tap left foot onto 2nd step x10 min, no UE support; cues to maintain upright posture; PT demonstrated "hip shakes" to help facilitate glut activation for hip stability, some difficulty with reproduction of movement.   Half-stance weight shifts x10 min LLE forward, no UE support, CGA; mod VC and max tactile cues with PT against patient's right hip and shoulder to maintain upright posture and to lift heel/toes during weight shift; gradually reduced tactile cues to mod.   Response to treatment; Patient tolerated well. Patient exhibited increased fatigue of RLE following extended time in SLS, provided seated rest breaks as needed. Patient continues to require frequent verbal and tactile cues for proper positioning, posture and sequencing. Attempts to take away tactile cues would result in patient's abandonment of the task at hand to reach for UE support. Increased difficulty on this date for patient to overcome fear-avoidance behaviors while performing activities involving RLE SLS. Dedicated more time this session towards static weight shifting to improve foundational gait mechanics and confidence.     PT Education - 11/25/18 1252    Education Details  strengthening, HEP, balance/gait safety    Person(s) Educated  Patient    Methods  Explanation;Verbal cues    Comprehension  Verbalized understanding;Returned demonstration;Verbal cues required;Need further instruction       PT Short Term Goals - 09/23/18 1149      PT SHORT TERM GOAL #1   Title  Patient will be adherent to HEP at least 3x a week to improve functional strength and balance for better safety at home.    Baseline  08/12/18- doing exercise daily;    Time  4    Period  Weeks    Status  Achieved    Target Date  08/17/18       PT SHORT TERM GOAL #2   Title   Patient will deny any falls over past 4 weeks to demonstrate improved safety awareness at home and work.     Baseline  08/12/18- fell 1 week ago; 09/23/18: none recent;    Time  4    Period  Weeks    Status  Achieved    Target Date  08/17/18        PT Long Term Goals - 11/10/18 1420      PT LONG TERM GOAL #1   Title  Patient will increase Berg Balance score by > 6 points to demonstrate decreased fall risk during functional activities.    Time  8    Period  Weeks  Status  Achieved      PT LONG TERM GOAL #2   Title  Patient will increase 10 meter walk test to >0.8 m/s as to improve gait speed for better community ambulation and to reduce fall risk.    Time  8    Period  Weeks    Status  Partially Met    Target Date  12/16/18      PT LONG TERM GOAL #3   Title  Patient will be require no assist with ascend/descend 3 steps using Least restrictive assistive device to improve safety with entry/exit home.     Time  8    Period  Weeks    Status  Partially Met    Target Date  12/16/18      PT LONG TERM GOAL #4   Title  Patient will increase BLE gross strength to 4+/5 as to improve functional strength for independent gait, increased standing tolerance and increased ADL ability.    Time  8    Period  Weeks    Status  Partially Met    Target Date  12/16/18            Plan - 11/25/18 1229    Clinical Impression Statement  Patient demonstrated increased difficulty with performing static weight shifting exercises this date without frequent verbal and tactile cues. Patient fear-avoidance behavior toard falling became a bigger obstacle in today's session, limiting progression of weight-shifting interventions. Continues to become fatigued with prolonged single-leg stance time on right leg, requiring occasional seated breaks. Patient would benefit from continued skilled PT to address the impairments and deficits presented in this note.    Rehab  Potential  Good    PT Frequency  2x / week    PT Duration  8 weeks    PT Treatment/Interventions  Cryotherapy;Electrical Stimulation;Moist Heat;Gait training;Stair training;Functional mobility training;Therapeutic activities;Therapeutic exercise;Balance training;Neuromuscular re-education;Patient/family education;Energy conservation    PT Next Visit Plan  Continue to work on balance activities and strengthening at next visit    PT [place]  Reviewed tandem stance and SLS at [place] with pt at today's visit.    Consulted and Agree with Plan of Care  Patient       Patient will benefit from skilled therapeutic intervention in order to improve the following deficits and impairments:  Abnormal gait, Decreased balance, Decreased endurance, Decreased mobility, Difficulty walking, Impaired perceived functional ability, Decreased activity tolerance, Decreased safety awareness  Visit Diagnosis: Unsteadiness on feet  Difficulty in walking, not elsewhere classified  Muscle weakness (generalized)  Abnormal gait  Left leg weakness     Problem List Patient Active Problem List   Diagnosis Date Noted  . NPH (normal pressure hydrocephalus) ([place]) 07/30/2016  . Left leg weakness 04/20/2016  . Menopausal osteoporosis 03/04/2016  . Abnormal gait 12/18/2015  . Left foot drop 12/18/2015  . BP (high blood pressure) 08/24/2015  . Hydronephrosis, left 07/28/2015  . UPJ (ureteropelvic junction) obstruction 07/28/2015  . Lower urinary tract infectious disease 07/14/2015  . Hydronephrosis 07/14/2015   Jeneen Rinks A. Rosana Hoes, SPT This entire session was performed under direct supervision and direction of a licensed therapist/therapist assistant . I have personally read, edited and approve of the note as written.  Trotter,Margaret PT, DPT 11/25/2018, 3:56 PM  [place] MAIN [facility] SERVICES [address] [place], [place], [address] Phone: [phone]    Fax:  [phone]  Name: Abigail Hill MRN: [id number] Date of Birth: 03/25/1950

## 2018-11-30 ENCOUNTER — Encounter: Payer: Self-pay | Admitting: Physical Therapy

## 2018-11-30 ENCOUNTER — Ambulatory Visit: Payer: PPO | Admitting: Physical Therapy

## 2018-11-30 ENCOUNTER — Other Ambulatory Visit: Payer: Self-pay

## 2018-11-30 DIAGNOSIS — R262 Difficulty in walking, not elsewhere classified: Secondary | ICD-10-CM

## 2018-11-30 DIAGNOSIS — M6281 Muscle weakness (generalized): Secondary | ICD-10-CM

## 2018-11-30 DIAGNOSIS — R2681 Unsteadiness on feet: Secondary | ICD-10-CM

## 2018-11-30 DIAGNOSIS — R269 Unspecified abnormalities of gait and mobility: Secondary | ICD-10-CM

## 2018-11-30 NOTE — Therapy (Signed)
[place] MAIN [facility] SERVICES [address] [place], [place], [address] Phone: [phone]   Fax:  [phone]  Physical Therapy Treatment  Patient Details  Name: Abigail Hill MRN: [id number] Date of Birth: 01/02/1951 Referring Provider (PT): Dr. Metta Clines   Encounter Date: 11/30/2018  PT End of Session - 11/30/18 1057    Visit Number  34    Number of Visits  46    Date for PT Re-Evaluation  12/16/18    Authorization Type  goals addressed 11/10/18    PT Start Time  1100    PT Stop Time  1145    PT Time Calculation (min)  45 min    Equipment Utilized During Treatment  Gait belt    Activity Tolerance  Patient limited by fatigue;Patient tolerated treatment well    Behavior During Therapy  [facility] for tasks assessed/performed       Past Medical History:  Diagnosis Date  . Abnormal gait 12/18/2015  . BP (high blood pressure) 08/24/2015  . Cancer ([place])    skin  . Constriction of ureter (postoperative) 07/28/2015  . Hydronephrosis 07/14/2015  . Hypertension   . Left foot drop 12/18/2015  . Recurrent UTI   . UPJ (ureteropelvic junction) obstruction 07/28/2015  . UTI (lower urinary tract infection) 07/14/2015    Past Surgical History:  Procedure Laterality Date  . broken foot  2012  . COLONOSCOPY WITH PROPOFOL N/A 06/30/2017   Procedure: COLONOSCOPY WITH PROPOFOL;  Surgeon: Lollie Sails, MD;  Location: [facility] ENDOSCOPY;  Service: Endoscopy;  Laterality: N/A;  . kidney repair  1985   repaired torn blood vessel in kidney    There were no vitals filed for this visit.  Subjective Assessment - 11/30/18 1100    Subjective  Patient is doing well today, no reports of pain and no new falls. Feels stronger every day, continued fear of falling but feels more under her control.    Pertinent History  68 yo Female reports instability with gait with difficulty walking. She states, "my left leg gives me trouble. It will  just plant and then I can't pick  it up." Pt reports 2 falls in last month. She reports when she falls it is often falling backwards; She was diagnosed with hydrocephalus and did a drain trial with no relief; She did not qualify for a shunt. Repeat imaging showed continued hydrocephalus that has not worsened; MRI of lumbar spine shows L5 pars defect with sacral insufficiency; She denies any back pain and reports no numbness in legs. She reports feeling frustration because they can't seem to find what's wrong with her. She does have urinary incontience; reports knowing when she has to go to the bathroom but can't get there fast enough; Patient has a walker and rollator; She uses both; She also has a manual wheelchair that she uses intermittently;     Limitations  Standing;Walking    How long can you sit comfortably?  NA    How long can you stand comfortably?  has pain in LLE with prolonged stooping; <10 min;     How long can you walk comfortably?  200 feet with assistance requiring several breaks;     Diagnostic tests  has had MRI and brain imaging;     Patient Stated Goals  "Be able to travel more, little sight seeing; walk short distances" Patient reports that she would like to be able to get rid of the walker.     Currently  in Pain?  No/denies    Pain Score  0-No pain    Multiple Pain Sites  No          TREATMENT:    Standing hip abduction 2x10 BLE with red theraband, BUE support;  Standing hip extension 2x10 BLE with red theraband, BUE support; Patient required min-moderate verbal/tactile cues for correct exercise technique.   NMR:  Hip hike 1x15 BLE AROM standing on firm surface, with UE rail support, CGA; cues to place left hand on hip to feel her hip raise. Hip hike with toe tap onto airex foam 1x15 BLE, with UE rail support, CGA; Toe tap onto first step 2x10 BLE with 2-1 UE rail support, CGA; cues to use hip hike movement to help lift foot onto step.  Toe tap onto second step starting from first step 2x10 LLE with 2-1  UE rail support, CGA; Attempted without UE support, unable to confidently perform activity without reaching for rail assist.   Side-stepping x10 feet x2 laps each direction, no UE support, CGA; cues to take larger steps by bringing feet closer together; patient maintains wide stance for stability, requires mod VC throughout exercise. Side-stepping over cane x10 feet x2 laps each direction, no UE support, CGA; cues to bring foot close to obstacle in order to have room for contralateral foot following step-over. Patient freezes after first step-over, unable to pass obstacle without mod VC.      Response to treatment; Patient tolerated well with today's treatment. Patient exhibited increased fatigue of RLE following extended time in SLS, provided seated rest breaks as needed. Patient continues to require frequent verbal and tactile cues for proper positioning, posture and sequencing. Patient was able to reproduce hip hike following demonstrations and VC, with increased difficulty on left side due to weakness. Continues to require UE support when completing stair activities, however patient was able to successfully reproduce exercise with 1 rail assist down from 2 rail assist. Continues to demonstrate difficulty with maintaining momentum while stepping over obstacle, causing freezing of gait and requires mod VC and occasionally 1 HHA to pass obstacle.           PT Education - 11/30/18 1057    Education Details  strengthening, HEP, balance/gait safety    Person(s) Educated  Patient    Methods  Explanation;Verbal cues    Comprehension  Verbalized understanding;Returned demonstration;Verbal cues required;Need further instruction       PT Short Term Goals - 09/23/18 1149      PT SHORT TERM GOAL #1   Title  Patient will be adherent to HEP at least 3x a week to improve functional strength and balance for better safety at home.    Baseline  08/12/18- doing exercise daily;    Time  4    Period   Weeks    Status  Achieved    Target Date  08/17/18      PT SHORT TERM GOAL #2   Title   Patient will deny any falls over past 4 weeks to demonstrate improved safety awareness at home and work.     Baseline  08/12/18- fell 1 week ago; 09/23/18: none recent;    Time  4    Period  Weeks    Status  Achieved    Target Date  08/17/18        PT Long Term Goals - 11/10/18 1420      PT LONG TERM GOAL #1   Title  Patient will increase Oceanographer  score by > 6 points to demonstrate decreased fall risk during functional activities.    Time  8    Period  Weeks    Status  Achieved      PT LONG TERM GOAL #2   Title  Patient will increase 10 meter walk test to >0.8 m/s as to improve gait speed for better community ambulation and to reduce fall risk.    Time  8    Period  Weeks    Status  Partially Met    Target Date  12/16/18      PT LONG TERM GOAL #3   Title  Patient will be require no assist with ascend/descend 3 steps using Least restrictive assistive device to improve safety with entry/exit home.     Time  8    Period  Weeks    Status  Partially Met    Target Date  12/16/18      PT LONG TERM GOAL #4   Title  Patient will increase BLE gross strength to 4+/5 as to improve functional strength for independent gait, increased standing tolerance and increased ADL ability.    Time  8    Period  Weeks    Status  Partially Met    Target Date  12/16/18            Plan - 11/30/18 1330    Clinical Impression Statement  Patient presented with good motivation for therapy today. She demonstrated good reproduction of hip hike motion necessary for LE clearance during gait and step negotiation. Continues to require mod VC and TC to complete activities with confidence and without fear of falling. Patient continues to fatigue easily after prolonged SLS time on RLE, eases with seated rest breaks as needed. Side stepping step length and obstacle negotiation improved with VC, however patient exhibits  freezing of gait after committing to step over obstacle. Patient would benefit from continued skilled PT intervenion to address the impairments and deficits presented in this note.    Rehab Potential  Good    PT Frequency  2x / week    PT Duration  8 weeks    PT Treatment/Interventions  Cryotherapy;Electrical Stimulation;Moist Heat;Gait training;Stair training;Functional mobility training;Therapeutic activities;Therapeutic exercise;Balance training;Neuromuscular re-education;Patient/family education;Energy conservation    PT Next Visit Plan  Continue to work on balance activities and strengthening at next visit    PT [place]  Reviewed tandem stance and SLS at [place] with pt at today's visit.    Consulted and Agree with Plan of Care  Patient       Patient will benefit from skilled therapeutic intervention in order to improve the following deficits and impairments:  Abnormal gait, Decreased balance, Decreased endurance, Decreased mobility, Difficulty walking, Impaired perceived functional ability, Decreased activity tolerance, Decreased safety awareness  Visit Diagnosis: Unsteadiness on feet  Difficulty in walking, not elsewhere classified  Muscle weakness (generalized)  Abnormal gait     Problem List Patient Active Problem List   Diagnosis Date Noted  . NPH (normal pressure hydrocephalus) ([place]) 07/30/2016  . Left leg weakness 04/20/2016  . Menopausal osteoporosis 03/04/2016  . Abnormal gait 12/18/2015  . Left foot drop 12/18/2015  . BP (high blood pressure) 08/24/2015  . Hydronephrosis, left 07/28/2015  . UPJ (ureteropelvic junction) obstruction 07/28/2015  . Lower urinary tract infectious disease 07/14/2015  . Hydronephrosis 07/14/2015   Jeneen Rinks A. Rosana Hoes, SPT This entire session was performed under direct supervision and direction of a licensed therapist/therapist assistant . I  have personally read, edited and approve of the note as  written.  Trotter,Margaret PT, DPT 11/30/2018, 2:53 PM  [place] MAIN [facility] SERVICES [address] [place], [place], [address] Phone: [phone]   Fax:  [phone]  Name: AMENA DOCKHAM MRN: [id number] Date of Birth: Dec 22, 1950

## 2018-12-02 ENCOUNTER — Other Ambulatory Visit: Payer: Self-pay

## 2018-12-02 ENCOUNTER — Ambulatory Visit: Payer: PPO | Attending: Neurology | Admitting: Physical Therapy

## 2018-12-02 ENCOUNTER — Encounter: Payer: Self-pay | Admitting: Physical Therapy

## 2018-12-02 DIAGNOSIS — R269 Unspecified abnormalities of gait and mobility: Secondary | ICD-10-CM | POA: Diagnosis not present

## 2018-12-02 DIAGNOSIS — R262 Difficulty in walking, not elsewhere classified: Secondary | ICD-10-CM

## 2018-12-02 DIAGNOSIS — R2681 Unsteadiness on feet: Secondary | ICD-10-CM | POA: Diagnosis not present

## 2018-12-02 DIAGNOSIS — R29898 Other symptoms and signs involving the musculoskeletal system: Secondary | ICD-10-CM | POA: Insufficient documentation

## 2018-12-02 DIAGNOSIS — M6281 Muscle weakness (generalized): Secondary | ICD-10-CM | POA: Diagnosis not present

## 2018-12-02 NOTE — Therapy (Signed)
[place] MAIN [facility] SERVICES [address] [place], [place], [address] Phone: [phone]   Fax:  [phone]  Physical Therapy Treatment  Patient Details  Name: Abigail Hill MRN: [id number] Date of Birth: 1950/06/29 Referring Provider (PT): Dr. Metta Clines   Encounter Date: 12/02/2018  PT End of Session - 12/02/18 1119    Visit Number  35    Number of Visits  46    Date for PT Re-Evaluation  12/16/18    Authorization Type  goals addressed 11/10/18    PT Start Time  1101    PT Stop Time  1145    PT Time Calculation (min)  44 min    Equipment Utilized During Treatment  Gait belt    Activity Tolerance  Patient limited by fatigue;Patient tolerated treatment well    Behavior During Therapy  [facility] for tasks assessed/performed       Past Medical History:  Diagnosis Date  . Abnormal gait 12/18/2015  . BP (high blood pressure) 08/24/2015  . Cancer ([place])    skin  . Constriction of ureter (postoperative) 07/28/2015  . Hydronephrosis 07/14/2015  . Hypertension   . Left foot drop 12/18/2015  . Recurrent UTI   . UPJ (ureteropelvic junction) obstruction 07/28/2015  . UTI (lower urinary tract infection) 07/14/2015    Past Surgical History:  Procedure Laterality Date  . broken foot  2012  . COLONOSCOPY WITH PROPOFOL N/A 06/30/2017   Procedure: COLONOSCOPY WITH PROPOFOL;  Surgeon: Lollie Sails, MD;  Location: [facility] ENDOSCOPY;  Service: Endoscopy;  Laterality: N/A;  . kidney repair  1985   repaired torn blood vessel in kidney    There were no vitals filed for this visit.  Subjective Assessment - 12/02/18 1107    Subjective  Patient states she is doing well today, no reports of pain or new falls. Claims she had some muscular soreness in her hips following last session, "in a good way".    Pertinent History  68 yo Female reports instability with gait with difficulty walking. She states, "my left leg gives me trouble. It will  just plant and then I  can't pick it up." Pt reports 2 falls in last month. She reports when she falls it is often falling backwards; She was diagnosed with hydrocephalus and did a drain trial with no relief; She did not qualify for a shunt. Repeat imaging showed continued hydrocephalus that has not worsened; MRI of lumbar spine shows L5 pars defect with sacral insufficiency; She denies any back pain and reports no numbness in legs. She reports feeling frustration because they can't seem to find what's wrong with her. She does have urinary incontience; reports knowing when she has to go to the bathroom but can't get there fast enough; Patient has a walker and rollator; She uses both; She also has a manual wheelchair that she uses intermittently;     Limitations  Standing;Walking    How long can you sit comfortably?  NA    How long can you stand comfortably?  has pain in LLE with prolonged stooping; <10 min;     How long can you walk comfortably?  200 feet with assistance requiring several breaks;     Diagnostic tests  has had MRI and brain imaging;     Patient Stated Goals  "Be able to travel more, little sight seeing; walk short distances" Patient reports that she would like to be able to get rid of the walker.  Currently in Pain?  No/denies    Pain Score  0-No pain    Multiple Pain Sites  No               TREATMENT:   Exercise:  Standing hip abduction 2x10 BLE with red theraband, BUE support;  Standing hip extension 2x10 BLE with red theraband, BUE support; Patient required min-moderate verbal/tactile cues for correct exercise technique.     NMR:   Hip hike 1x15 BLE AROM standing on firm surface, with RUE rail support, CGA; cues to place left hand on hip for improved proprioception Toe tap onto first step 2x10 LLE with 1-0 UE rail support, CGA; cues to use hip hike movement to help lift foot onto step.  Toe tap onto second step starting from first step 2x10 LLE with  RUE rail support, CGA;   Fwd/bwd  step over cane x10 no UE support, CGA; cues to quickly step over to prevent freezing by conserving momentum; patient unable to step over cane backwards this date.  Side/side step over cane x10; cues to quickly step over to prevent freezing by conserving momentum   Side-stepping over cane x10 feet x4 laps each direction, no UE support, CGA; cues to bring foot close to obstacle in order to have room for contralateral foot following step-over. Patient freezes after first step-over, improves with repetition. Some difficulty with pointing toes anteriorly during side walk, resulting in antero-lateral step over cane rather than lateral step.   Pt educated throughout session about proper posture and technique with exercises. Improved exercise technique, movement at target joints, use of target muscles after min to mod verbal, visual, tactile cues.   Neuro Re-ed: Patient requires CGA during all standing interventions due to limited stability and patient fear of LOB. Cueing for reduction of UE support required as well as postural alignment for optimal stability within COM   Patient demonstrates excellent motivation throughout today's session. Patient was able to perform improved hip hikes/weight shifts onto RLE for anterior step-ups, required RUE rail assist often and mod VC. She continues to be challenged by stepping over obstacles in all directions, which improved with repetition and VC. Patient was unable to step over cane in backwards direction this date.     PT Education - 12/02/18 1118    Education Details  strengthening, HEP, balance/ gait safety    Person(s) Educated  Patient    Methods  Explanation;Verbal cues    Comprehension  Verbalized understanding;Returned demonstration;Verbal cues required;Need further instruction       PT Short Term Goals - 09/23/18 1149      PT SHORT TERM GOAL #1   Title  Patient will be adherent to HEP at least 3x a week to improve functional strength and balance  for better safety at home.    Baseline  08/12/18- doing exercise daily;    Time  4    Period  Weeks    Status  Achieved    Target Date  08/17/18      PT SHORT TERM GOAL #2   Title   Patient will deny any falls over past 4 weeks to demonstrate improved safety awareness at home and work.     Baseline  08/12/18- fell 1 week ago; 09/23/18: none recent;    Time  4    Period  Weeks    Status  Achieved    Target Date  08/17/18        PT Long Term Goals - 11/10/18 1420  PT LONG TERM GOAL #1   Title  Patient will increase Berg Balance score by > 6 points to demonstrate decreased fall risk during functional activities.    Time  8    Period  Weeks    Status  Achieved      PT LONG TERM GOAL #2   Title  Patient will increase 10 meter walk test to >0.8 m/s as to improve gait speed for better community ambulation and to reduce fall risk.    Time  8    Period  Weeks    Status  Partially Met    Target Date  12/16/18      PT LONG TERM GOAL #3   Title  Patient will be require no assist with ascend/descend 3 steps using Least restrictive assistive device to improve safety with entry/exit home.     Time  8    Period  Weeks    Status  Partially Met    Target Date  12/16/18      PT LONG TERM GOAL #4   Title  Patient will increase BLE gross strength to 4+/5 as to improve functional strength for independent gait, increased standing tolerance and increased ADL ability.    Time  8    Period  Weeks    Status  Partially Met    Target Date  12/16/18            Plan - 12/02/18 1154    Clinical Impression Statement  Patient presented with excellent motivation for today's session. She demonstrated great proprioceptive retention of hip hike motion, requiring min VC and demonstration for improved form. Patient continues to be challenged with negotiating obstacles, exhibiting freezing after initial step over, which improved significantly with repetition and mod VC. Patient does fatigue easily  with increased SLS time on right leg, which eased with occasional seated rest breaks. Patient would continue to benefit from skilled PT to address the deficits outlined in this note.    Rehab Potential  Good    PT Frequency  2x / week    PT Duration  8 weeks    PT Treatment/Interventions  Cryotherapy;Electrical Stimulation;Moist Heat;Gait training;Stair training;Functional mobility training;Therapeutic activities;Therapeutic exercise;Balance training;Neuromuscular re-education;Patient/family education;Energy conservation    PT Next Visit Plan  Continue to work on balance activities and strengthening at next visit    PT [place]  Reviewed tandem stance and SLS at [place] with pt at today's visit.    Consulted and Agree with Plan of Care  Patient       Patient will benefit from skilled therapeutic intervention in order to improve the following deficits and impairments:  Abnormal gait, Decreased balance, Decreased endurance, Decreased mobility, Difficulty walking, Impaired perceived functional ability, Decreased activity tolerance, Decreased safety awareness  Visit Diagnosis: Unsteadiness on feet  Difficulty in walking, not elsewhere classified  Muscle weakness (generalized)     Problem List Patient Active Problem List   Diagnosis Date Noted  . NPH (normal pressure hydrocephalus) ([place]) 07/30/2016  . Left leg weakness 04/20/2016  . Menopausal osteoporosis 03/04/2016  . Abnormal gait 12/18/2015  . Left foot drop 12/18/2015  . BP (high blood pressure) 08/24/2015  . Hydronephrosis, left 07/28/2015  . UPJ (ureteropelvic junction) obstruction 07/28/2015  . Lower urinary tract infectious disease 07/14/2015  . Hydronephrosis 07/14/2015   Jeneen Rinks A. Rosana Hoes, SPT This entire session was performed under direct supervision and direction of a licensed therapist/therapist assistant . I have personally read, edited and approve of the  note as written.  Trotter,Margaret PT,  DPT 12/02/2018, 3:57 PM  [place] MAIN [facility] SERVICES [address] [place], [place], [address] Phone: [phone]   Fax:  [phone]  Name: TIKESHA MORT MRN: [id number] Date of Birth: [birth year]

## 2018-12-07 ENCOUNTER — Encounter: Payer: Self-pay | Admitting: Physical Therapy

## 2018-12-07 ENCOUNTER — Ambulatory Visit: Payer: PPO | Admitting: Physical Therapy

## 2018-12-07 ENCOUNTER — Other Ambulatory Visit: Payer: Self-pay

## 2018-12-07 DIAGNOSIS — R29898 Other symptoms and signs involving the musculoskeletal system: Secondary | ICD-10-CM

## 2018-12-07 DIAGNOSIS — R262 Difficulty in walking, not elsewhere classified: Secondary | ICD-10-CM

## 2018-12-07 DIAGNOSIS — R2681 Unsteadiness on feet: Secondary | ICD-10-CM

## 2018-12-07 DIAGNOSIS — R269 Unspecified abnormalities of gait and mobility: Secondary | ICD-10-CM

## 2018-12-07 DIAGNOSIS — M6281 Muscle weakness (generalized): Secondary | ICD-10-CM

## 2018-12-07 NOTE — Therapy (Signed)
[facility] REHAB SERVICES [address] [place], [place], [address] Phone: [phone]   Fax:  [phone]  Physical Therapy Treatment  Patient Details  Name: Abigail Hill MRN: [id number] Date of Birth: 12/09/1950 Referring Provider (PT): Dr. Adam Jaffe   Encounter Date: 12/07/2018  PT End of Session - 12/07/18 1153    Visit Number  36    Number of Visits  46    Date for PT Re-Evaluation  12/16/18    Authorization Type  goals addressed 11/10/18    PT Start Time  1146    PT Stop Time  1230    PT Time Calculation (min)  44 min    Equipment Utilized During Treatment  Gait belt    Activity Tolerance  Patient limited by fatigue;Patient tolerated treatment well    Behavior During Therapy  WFL for tasks assessed/performed       Past Medical History:  Diagnosis Date  . Abnormal gait 12/18/2015  . BP (high blood pressure) 08/24/2015  . Cancer ([facility])    skin  . Constriction of ureter (postoperative) 07/28/2015  . Hydronephrosis 07/14/2015  . Hypertension   . Left foot drop 12/18/2015  . Recurrent UTI   . UPJ (ureteropelvic junction) obstruction 07/28/2015  . UTI (lower urinary tract infection) 07/14/2015    Past Surgical History:  Procedure Laterality Date  . broken foot  2012  . COLONOSCOPY WITH PROPOFOL N/A 06/30/2017   Procedure: COLONOSCOPY WITH PROPOFOL;  Surgeon: Skulskie, Martin U, MD;  Location: [facility] ENDOSCOPY;  Service: Endoscopy;  Laterality: N/A;  . kidney repair  1985   repaired torn blood vessel in kidney    There were no vitals filed for this visit.  Subjective Assessment - 12/07/18 1145    Subjective  Patient states that she is doing well today, no reports of pain or any new falls. Reports that her balance is good some days, poor on others.    Pertinent History  68 yo Female reports instability with gait with difficulty walking. She states, "my left leg gives me trouble. It will  just plant and then I can't pick it up." Pt  reports 2 falls in last month. She reports when she falls it is often falling backwards; She was diagnosed with hydrocephalus and did a drain trial with no relief; She did not qualify for a shunt. Repeat imaging showed continued hydrocephalus that has not worsened; MRI of lumbar spine shows L5 pars defect with sacral insufficiency; She denies any back pain and reports no numbness in legs. She reports feeling frustration because they can't seem to find what's wrong with her. She does have urinary incontience; reports knowing when she has to go to the bathroom but can't get there fast enough; Patient has a walker and rollator; She uses both; She also has a manual wheelchair that she uses intermittently;     Limitations  Standing;Walking    How long can you sit comfortably?  NA    How long can you stand comfortably?  has pain in LLE with prolonged stooping; <10 min;     How long can you walk comfortably?  200 feet with assistance requiring several breaks;     Diagnostic tests  has had MRI and brain imaging;     Patient Stated Goals  "Be able to travel more, little sight seeing; walk short distances" Patient reports that she would like to be able to get rid of the walker.     Currently   in Pain?  No/denies    Pain Score  0-No pain       Ther-ex: -Standing hip abduction 2x10 BLE with red theraband; -Standing hip extension 2x10 BLE with red theraband; -Standing hip flexion 1x10 BLE with red theraband Patient required min-moderate verbal/tactile cues for correct exercise technique.     NMR:   -Hip hike 1x15 BLE AROM standing on firm surface; demonstration and min VC and TC to reproduce movement safely without rail support.  -Toe tap onto first step 1x10 LLE with 1-0 UE rail support, CGA; cues to use hip hike movement to help lift foot onto step.   -Toe tap onto second step starting from first step 1x10 LLE , CGA; mod tactile cues against patient's right hip for complete weight shift.   Fwdstep  over cane x10; min VC for proper sequencing to negotiate obstacle safely.   Fwd walking x10 feet x4 laps with step over cane;   Side/side step over cane x10; min VC to quickly step over to prevent freezing by conserving momentum   Side-stepping over cane x10 feet x4 laps each direction; cues to bring foot close to obstacle in order to have room for contralateral foot following step-over. Some difficulty with pointing toes anteriorly during side walk, resulting in antero-lateral step over cane rather than lateral step.   In 10MWT area:  Ambulate 10 feet x3 laps with intermittent cues to stop and walk backwards; cues to maintain increased step length for improved gait speed. Patient demonstrated step-to gait with LLE dur to decreased SLS time on RLE, improved to 3 step-through steps during final lap.   Pt educated throughout session about proper posture and technique with exercises. Improved exercise technique, movement at target joints, use of target muscles after min to mod verbal, visual, tactile cues.    Neuro Re-ed: Patient requires CGA during all standing interventions due to limited stability and patient fear of LOB. Cueing for reduction of UE support required as well as postural alignment for optimal stability within COM     Patient demonstrates excellent motivation throughout today's session. Patient was able to perform obstacle negotiation with significant improvements compared to previous session, required min VC for sequencing. Patient continues to be challenged by SLS time on RLE and backwards walking, which improved with mod VC and TC as well as repetition.    PT Education - 12/07/18 1151    Education Details  strengthening, HEP, balance/gait safety.    Person(s) Educated  Patient    Methods  Explanation;Verbal cues    Comprehension  Verbalized understanding;Returned demonstration;Verbal cues required;Need further instruction       PT Short Term Goals - 09/23/18 1149       PT SHORT TERM GOAL #1   Title  Patient will be adherent to HEP at least 3x a week to improve functional strength and balance for better safety at home.    Baseline  08/12/18- doing exercise daily;    Time  4    Period  Weeks    Status  Achieved    Target Date  08/17/18      PT SHORT TERM GOAL #2   Title   Patient will deny any falls over past 4 weeks to demonstrate improved safety awareness at home and work.     Baseline  08/12/18- fell 1 week ago; 09/23/18: none recent;    Time  4    Period  Weeks    Status  Achieved    Target Date  08/17/18        PT Long Term Goals - 11/10/18 1420      PT LONG TERM GOAL #1   Title  Patient will increase Berg Balance score by > 6 points to demonstrate decreased fall risk during functional activities.    Time  8    Period  Weeks    Status  Achieved      PT LONG TERM GOAL #2   Title  Patient will increase 10 meter walk test to >0.8 m/s as to improve gait speed for better community ambulation and to reduce fall risk.    Time  8    Period  Weeks    Status  Partially Met    Target Date  12/16/18      PT LONG TERM GOAL #3   Title  Patient will be require no assist with ascend/descend 3 steps using Least restrictive assistive device to improve safety with entry/exit home.     Time  8    Period  Weeks    Status  Partially Met    Target Date  12/16/18      PT LONG TERM GOAL #4   Title  Patient will increase BLE gross strength to 4+/5 as to improve functional strength for independent gait, increased standing tolerance and increased ADL ability.    Time  8    Period  Weeks    Status  Partially Met    Target Date  12/16/18            Plan - 12/07/18 1246    Clinical Impression Statement  Patient presented with excellent motivation during today's session. She continues to be challenged with weight shifting and SLS time on RLE, requiring mod tactile and verbal cues as well as demonstration in order to perform toe-taps, which improved with  repetition. She demonstrated significantly improved step-over strategies for obstacle negotiation in forward and sideways directions. Backwards walking continues to be a challenge for her, which improved with cues and repetition but has much room for improvement. Patient would continue to benefit from skilled PT to address the deficits outlined in this note.    Rehab Potential  Good    PT Frequency  2x / week    PT Duration  8 weeks    PT Treatment/Interventions  Cryotherapy;Electrical Stimulation;Moist Heat;Gait training;Stair training;Functional mobility training;Therapeutic activities;Therapeutic exercise;Balance training;Neuromuscular re-education;Patient/family education;Energy conservation    PT Next Visit Plan  Continue to work on balance activities and strengthening at next visit    PT [place]  Reviewed tandem stance and SLS at [place] with pt at today's visit.    Consulted and Agree with Plan of Care  Patient       Patient will benefit from skilled therapeutic intervention in order to improve the following deficits and impairments:  Abnormal gait, Decreased balance, Decreased endurance, Decreased mobility, Difficulty walking, Impaired perceived functional ability, Decreased activity tolerance, Decreased safety awareness  Visit Diagnosis: Unsteadiness on feet  Difficulty in walking, not elsewhere classified  Muscle weakness (generalized)  Abnormal gait  Left leg weakness     Problem List Patient Active Problem List   Diagnosis Date Noted  . NPH (normal pressure hydrocephalus) ([place]) 07/30/2016  . Left leg weakness 04/20/2016  . Menopausal osteoporosis 03/04/2016  . Abnormal gait 12/18/2015  . Left foot drop 12/18/2015  . BP (high blood pressure) 08/24/2015  . Hydronephrosis, left 07/28/2015  . UPJ (ureteropelvic junction) obstruction 07/28/2015  . Lower urinary tract infectious  disease 07/14/2015  . Hydronephrosis 07/14/2015   Jeneen Rinks A. Rosana Hoes,  SPT This entire session was performed under direct supervision and direction of a licensed therapist/therapist assistant . I have personally read, edited and approve of the note as written.  Trotter,Margaret PT, DPT 12/07/2018, 2:50 PM  [place] MAIN [facility] SERVICES [address] [place], [place], [address] Phone: [phone]   Fax:  [phone]  Name: Abigail Hill MRN: [id number] Date of Birth: [birth year]

## 2018-12-09 ENCOUNTER — Other Ambulatory Visit: Payer: Self-pay

## 2018-12-09 ENCOUNTER — Ambulatory Visit: Payer: PPO | Admitting: Physical Therapy

## 2018-12-09 ENCOUNTER — Encounter: Payer: Self-pay | Admitting: Physical Therapy

## 2018-12-09 DIAGNOSIS — R2681 Unsteadiness on feet: Secondary | ICD-10-CM

## 2018-12-09 DIAGNOSIS — R29898 Other symptoms and signs involving the musculoskeletal system: Secondary | ICD-10-CM

## 2018-12-09 DIAGNOSIS — R269 Unspecified abnormalities of gait and mobility: Secondary | ICD-10-CM

## 2018-12-09 DIAGNOSIS — R262 Difficulty in walking, not elsewhere classified: Secondary | ICD-10-CM

## 2018-12-09 DIAGNOSIS — M6281 Muscle weakness (generalized): Secondary | ICD-10-CM

## 2018-12-09 NOTE — Therapy (Signed)
[place] MAIN [facility] SERVICES [address] [place], [place], [address] Phone: [phone]   Fax:  [phone]  Physical Therapy Treatment  Patient Details  Name: Abigail Hill MRN: [id number] Date of Birth: 1950/07/09 Referring Provider (PT): Dr. Metta Clines   Encounter Date: 12/09/2018  PT End of Session - 12/09/18 1141    Visit Number  37    Number of Visits  84    Date for PT Re-Evaluation  12/16/18    Authorization Type  goals addressed 11/10/18    PT Start Time  1145    PT Stop Time  1230    PT Time Calculation (min)  45 min    Equipment Utilized During Treatment  Gait belt    Activity Tolerance  Patient limited by fatigue;Patient tolerated treatment well    Behavior During Therapy  [facility] for tasks assessed/performed       Past Medical History:  Diagnosis Date  . Abnormal gait 12/18/2015  . BP (high blood pressure) 08/24/2015  . Cancer ([place])    skin  . Constriction of ureter (postoperative) 07/28/2015  . Hydronephrosis 07/14/2015  . Hypertension   . Left foot drop 12/18/2015  . Recurrent UTI   . UPJ (ureteropelvic junction) obstruction 07/28/2015  . UTI (lower urinary tract infection) 07/14/2015    Past Surgical History:  Procedure Laterality Date  . broken foot  2012  . COLONOSCOPY WITH PROPOFOL N/A 06/30/2017   Procedure: COLONOSCOPY WITH PROPOFOL;  Surgeon: Lollie Sails, MD;  Location: [facility] ENDOSCOPY;  Service: Endoscopy;  Laterality: N/A;  . kidney repair  1985   repaired torn blood vessel in kidney    There were no vitals filed for this visit.  Subjective Assessment - 12/09/18 1143    Subjective  Patient states that she is doing well today, no new falls or any new concerns.    Pertinent History  68 yo Female reports instability with gait with difficulty walking. She states, "my left leg gives me trouble. It will  just plant and then I can't pick it up." Pt reports 2 falls in last month. She reports when she falls it  is often falling backwards; She was diagnosed with hydrocephalus and did a drain trial with no relief; She did not qualify for a shunt. Repeat imaging showed continued hydrocephalus that has not worsened; MRI of lumbar spine shows L5 pars defect with sacral insufficiency; She denies any back pain and reports no numbness in legs. She reports feeling frustration because they can't seem to find what's wrong with her. She does have urinary incontience; reports knowing when she has to go to the bathroom but can't get there fast enough; Patient has a walker and rollator; She uses both; She also has a manual wheelchair that she uses intermittently;     Limitations  Standing;Walking    How long can you sit comfortably?  NA    How long can you stand comfortably?  has pain in LLE with prolonged stooping; <10 min;     How long can you walk comfortably?  200 feet with assistance requiring several breaks;     Diagnostic tests  has had MRI and brain imaging;     Patient Stated Goals  "Be able to travel more, little sight seeing; walk short distances" Patient reports that she would like to be able to get rid of the walker.     Currently in Pain?  No/denies    Pain Score  0-No pain  This entire session was performed under direct supervision and direction of a licensed therapist/therapist assistant . I have personally read, edited and approve of the note as written. Treatment:  Ther-ex:  -Standing hip abduction 2x10 BLE with red theraband;  -Standing hip extension 2x10 BLE with red theraband;   NMR:   -Hip hike 1x15 BLE AROM standing on firm surface; demonstration and min VC and TC to reproduce movement safely without rail support.   -Alternating toe tap onto first step x20 with 1-0 UE rail support, CGA; cues to use hip hike movement to help lift foot onto step.    -Toe tap onto second step starting from first step x10 LLE , CGA; mod tactile cues against patient's right hip for complete weight shift.    -4-square stepping strategies with cane step-over sideways and fwd/bwd x5 min  Pt. Able to complete clockwise rotation across 4-square stepping over obstacles in 14.19 sec   -Fwd walking x10 feet x4 laps with step over cane; cues to speed up walking when approaching obstacle to prevent freezing of gait and preserve momentum    -Side-stepping over cane x10 feet x5 laps each direction; cues to bring foot close to obstacle in order to have room for contralateral foot following step-over. Cues to bring feet close together to facilitate increased step length necessary for obstacle negotiation.    In 10MWT area:   Ambulate 15 feet x2 laps fwd and bwd; cues to maintain increased step length for improved gait speed. Patient demonstrated step-to gait with LLE due to decreased SLS time on RLE, improved with repetition and cues.   Pt educated throughout session about proper posture and technique with exercises. Improved exercise technique, movement at target joints, use of target muscles after min to mod verbal, visual, tactile cues.    Neuro Re-ed: Patient requires CGA during all standing interventions due to limited stability and patient fear of LOB. Cueing for reduction of UE support required as well as postural alignment for optimal stability within COM     Patient demonstrates excellent motivation throughout today's session. Patient was able to perform toe taps without UE support and significantly improved weight shifting, required min TC to increase SLS time. Patient continues to be challenged by obstacle negotiation and retro gait, which improved significantly with min VC and TC, demonstration, and repetition. Patient also demonstrates improved step length this date compared to previous session.      PT Education - 12/09/18 1141    Education Details  strengthening, HEP, balance/gait safety    Person(s) Educated  Patient    Methods  Explanation;Verbal cues    Comprehension  Verbalized  understanding;Returned demonstration;Verbal cues required;Need further instruction       PT Short Term Goals - 09/23/18 1149      PT SHORT TERM GOAL #1   Title  Patient will be adherent to HEP at least 3x a week to improve functional strength and balance for better safety at home.    Baseline  08/12/18- doing exercise daily;    Time  4    Period  Weeks    Status  Achieved    Target Date  08/17/18      PT SHORT TERM GOAL #2   Title   Patient will deny any falls over past 4 weeks to demonstrate improved safety awareness at home and work.     Baseline  08/12/18- fell 1 week ago; 09/23/18: none recent;    Time  4    Period  Weeks    Status  Achieved    Target Date  08/17/18        PT Long Term Goals - 11/10/18 1420      PT LONG TERM GOAL #1   Title  Patient will increase Berg Balance score by > 6 points to demonstrate decreased fall risk during functional activities.    Time  8    Period  Weeks    Status  Achieved      PT LONG TERM GOAL #2   Title  Patient will increase 10 meter walk test to >0.8 m/s as to improve gait speed for better community ambulation and to reduce fall risk.    Time  8    Period  Weeks    Status  Partially Met    Target Date  12/16/18      PT LONG TERM GOAL #3   Title  Patient will be require no assist with ascend/descend 3 steps using Least restrictive assistive device to improve safety with entry/exit home.     Time  8    Period  Weeks    Status  Partially Met    Target Date  12/16/18      PT LONG TERM GOAL #4   Title  Patient will increase BLE gross strength to 4+/5 as to improve functional strength for independent gait, increased standing tolerance and increased ADL ability.    Time  8    Period  Weeks    Status  Partially Met    Target Date  12/16/18            Plan - 12/09/18 1147    Clinical Impression Statement  Patient presented with excellent motivation during today's session. She continues to be challenged with weight shifting  to RLE and maintaining SLS time, requiring mod tactile cues and min VC to complete toe taps without UE support. She demonstrated significantly improved obstacle negotiation following 4-square stepping strategies, with good foot clearance and less freezing of gait. Retro gait continues to be a challenge for this patient, was able to demonstrate slightly longer step length following verbal and tactile cues. Patient would continue to benefit from skilled PT intervention to address the deficits outlined in this note.    Rehab Potential  Good    PT Frequency  2x / week    PT Duration  8 weeks    PT Treatment/Interventions  Cryotherapy;Electrical Stimulation;Moist Heat;Gait training;Stair training;Functional mobility training;Therapeutic activities;Therapeutic exercise;Balance training;Neuromuscular re-education;Patient/family education;Energy conservation    PT Next Visit Plan  Continue to work on balance activities and strengthening at next visit    PT [place]  Reviewed tandem stance and SLS at [place] with pt at today's visit.    Consulted and Agree with Plan of Care  Patient       Patient will benefit from skilled therapeutic intervention in order to improve the following deficits and impairments:  Abnormal gait, Decreased balance, Decreased endurance, Decreased mobility, Difficulty walking, Impaired perceived functional ability, Decreased activity tolerance, Decreased safety awareness  Visit Diagnosis: Unsteadiness on feet  Difficulty in walking, not elsewhere classified  Muscle weakness (generalized)  Abnormal gait  Left leg weakness     Problem List Patient Active Problem List   Diagnosis Date Noted  . NPH (normal pressure hydrocephalus) ([place]) 07/30/2016  . Left leg weakness 04/20/2016  . Menopausal osteoporosis 03/04/2016  . Abnormal gait 12/18/2015  . Left foot drop 12/18/2015  . BP (high blood pressure) 08/24/2015  .  Hydronephrosis, left 07/28/2015  . UPJ  (ureteropelvic junction) obstruction 07/28/2015  . Lower urinary tract infectious disease 07/14/2015  . Hydronephrosis 07/14/2015   Jeneen Rinks A. [address], SPT  [place], [place], [place] DPT 12/09/2018, 4:11 PM  [place] MAIN [facility] SERVICES [address] [place], [place], [address] Phone: [phone]   Fax:  [phone]  Name: Abigail Hill MRN: [id number] Date of Birth: 1951-01-30

## 2018-12-14 ENCOUNTER — Other Ambulatory Visit: Payer: Self-pay

## 2018-12-14 ENCOUNTER — Ambulatory Visit: Payer: PPO | Admitting: Physical Therapy

## 2018-12-14 ENCOUNTER — Encounter: Payer: Self-pay | Admitting: Physical Therapy

## 2018-12-14 DIAGNOSIS — M6281 Muscle weakness (generalized): Secondary | ICD-10-CM

## 2018-12-14 DIAGNOSIS — R2681 Unsteadiness on feet: Secondary | ICD-10-CM | POA: Diagnosis not present

## 2018-12-14 DIAGNOSIS — R262 Difficulty in walking, not elsewhere classified: Secondary | ICD-10-CM

## 2018-12-14 NOTE — Therapy (Signed)
[place] MAIN [facility] SERVICES [address] [place], [place], [address] Phone: [phone]   Fax:  [phone]  Physical Therapy Treatment  Patient Details  Name: Abigail Hill MRN: [id number] Date of Birth: 23-Nov-1950 Referring Provider (PT): Dr. Metta Clines   Encounter Date: 12/14/2018  PT End of Session - 12/14/18 1518    Visit Number  38    Number of Visits  46    Date for PT Re-Evaluation  12/16/18    Authorization Type  goals addressed 11/10/18    PT Start Time  1432    PT Stop Time  1515    PT Time Calculation (min)  43 min    Equipment Utilized During Treatment  Gait belt    Activity Tolerance  Patient limited by fatigue;Patient tolerated treatment well    Behavior During Therapy  [facility] for tasks assessed/performed       Past Medical History:  Diagnosis Date  . Abnormal gait 12/18/2015  . BP (high blood pressure) 08/24/2015  . Cancer ([place])    skin  . Constriction of ureter (postoperative) 07/28/2015  . Hydronephrosis 07/14/2015  . Hypertension   . Left foot drop 12/18/2015  . Recurrent UTI   . UPJ (ureteropelvic junction) obstruction 07/28/2015  . UTI (lower urinary tract infection) 07/14/2015    Past Surgical History:  Procedure Laterality Date  . broken foot  2012  . COLONOSCOPY WITH PROPOFOL N/A 06/30/2017   Procedure: COLONOSCOPY WITH PROPOFOL;  Surgeon: Lollie Sails, MD;  Location[facility] ENDOSCOPY;  Service: Endoscopy;  Laterality: N/A;  . kidney repair  1985   repaired torn blood vessel in kidney    There were no vitals filed for this visit.  Subjective Assessment - 12/14/18 1434    Subjective  Patient reports increased fatigue after her husband had surgery this week; She reports having to get up more at night to help him;    Pertinent History  68 yo Female reports instability with gait with difficulty walking. She states, "my left leg gives me trouble. It will  just plant and then I can't pick it up." Pt reports 2  falls in last month. She reports when she falls it is often falling backwards; She was diagnosed with hydrocephalus and did a drain trial with no relief; She did not qualify for a shunt. Repeat imaging showed continued hydrocephalus that has not worsened; MRI of lumbar spine shows L5 pars defect with sacral insufficiency; She denies any back pain and reports no numbness in legs. She reports feeling frustration because they can't seem to find what's wrong with her. She does have urinary incontience; reports knowing when she has to go to the bathroom but can't get there fast enough; Patient has a walker and rollator; She uses both; She also has a manual wheelchair that she uses intermittently;     Limitations  Standing;Walking    How long can you sit comfortably?  NA    How long can you stand comfortably?  has pain in LLE with prolonged stooping; <10 min;     How long can you walk comfortably?  200 feet with assistance requiring several breaks;     Diagnostic tests  has had MRI and brain imaging;     Patient Stated Goals  "Be able to travel more, little sight seeing; walk short distances" Patient reports that she would like to be able to get rid of the walker.     Currently in Pain?  No/denies  Multiple Pain Sites  No       TREATMENT:  Ther-ex: -Standing hip abduction x15 BLE with red theraband; -Standing hip extension x15 BLE with red theraband; -side stepping x10 feet x2 laps each direction unsupported with min A for safety;  Patient required min-moderate verbal/tactile cues for correct exercise technique including to improve weight shift for better stance control and to improve hip control especially on RLE for less trendelenburg hip;    NMR:  Sit<>Stand from regular chair unsupported, stepping forward 3 steps and then backwards to chair; goal was to step backwards 3 steps but often patient required 4-5 steps for balance x5 sets;    Standing unsupported: Alternate march x10 reps x3  sets bilaterally with cues to increase AROM for better flexibility and balance; patient able to exhibit improved foot clearance and better dynamic balance with increased repetitions;   4 square stepping: clockwise/counterclockwise x3 sets each direction with CGA for safety and min Vcs to increase step length; Patient able to exhibit improved step length with better foot clearance in most directions but was more hesitant with stepping backwards and to the left;   Resisted walking: 12.5# forward/backward x3 laps with min A for safety with min VCs to improve weight shift for better dynamic balance especially when walking backwards;   Weaving around cones #6 x2 laps with CGA for safety and cues to increase step length, improve erect posture and increase extension in hip/knee; With increased repetition patient often exhibits a short step length with crouched positioning; Instructed patient to increase hip/knee extension for better step length and gait mobility;    Pt educated throughout session about proper posture and technique with exercises. Improved exercise technique, movement at target joints, use of target muscles after min to mod verbal, visual, tactile cues.  Neuro Re-ed: Patient requires CGA during all standing interventions due to limited stability and patient fear of LOB. Cueing for reduction of UE support required as well as postural alignment for optimal stability within COM                      PT Education - 12/14/18 1439    Education Details  strengthening, HEP, balance/gait safety;    Person(s) Educated  Patient    Methods  Explanation;Verbal cues    Comprehension  Verbalized understanding;Returned demonstration;Verbal cues required;Need further instruction       PT Short Term Goals - 09/23/18 1149      PT SHORT TERM GOAL #1   Title  Patient will be adherent to HEP at least 3x a week to improve functional strength and balance for better safety at home.     Baseline  08/12/18- doing exercise daily;    Time  4    Period  Weeks    Status  Achieved    Target Date  08/17/18      PT SHORT TERM GOAL #2   Title   Patient will deny any falls over past 4 weeks to demonstrate improved safety awareness at home and work.     Baseline  08/12/18- fell 1 week ago; 09/23/18: none recent;    Time  4    Period  Weeks    Status  Achieved    Target Date  08/17/18        PT Long Term Goals - 11/10/18 1420      PT LONG TERM GOAL #1   Title  Patient will increase Berg Balance score by > 6 points to  demonstrate decreased fall risk during functional activities.    Time  8    Period  Weeks    Status  Achieved      PT LONG TERM GOAL #2   Title  Patient will increase 10 meter walk test to >0.8 m/s as to improve gait speed for better community ambulation and to reduce fall risk.    Time  8    Period  Weeks    Status  Partially Met    Target Date  12/16/18      PT LONG TERM GOAL #3   Title  Patient will be require no assist with ascend/descend 3 steps using Least restrictive assistive device to improve safety with entry/exit home.     Time  8    Period  Weeks    Status  Partially Met    Target Date  12/16/18      PT LONG TERM GOAL #4   Title  Patient will increase BLE gross strength to 4+/5 as to improve functional strength for independent gait, increased standing tolerance and increased ADL ability.    Time  8    Period  Weeks    Status  Partially Met    Target Date  12/16/18            Plan - 12/14/18 1519    Clinical Impression Statement  Patient motivated and participated well within session. Patient requires min VCs for proper positioning and exercise technique. She was able to exhibit improved weight shift and ROM with increased repetition. Patient instructed in advanced balance tasks with unsupported stepping in various directions. Overall patient is able to take bigger steps with improved foot clearance at times but then will revert back  to shuffled steps. Patient able to exhibit improved weight shift and stance control with resisted walking although did occasionally exhibit shuffled steps. Patient would benefit from additional skilled PT intervention to improve strength, balance and gait safety;    Rehab Potential  Good    PT Frequency  2x / week    PT Duration  8 weeks    PT Treatment/Interventions  Cryotherapy;Electrical Stimulation;Moist Heat;Gait training;Stair training;Functional mobility training;Therapeutic activities;Therapeutic exercise;Balance training;Neuromuscular re-education;Patient/family education;Energy conservation    PT Next Visit Plan  Continue to work on balance activities and strengthening at next visit    PT [place]  Reviewed tandem stance and SLS at [place] with pt at today's visit.    Consulted and Agree with Plan of Care  Patient       Patient will benefit from skilled therapeutic intervention in order to improve the following deficits and impairments:  Abnormal gait, Decreased balance, Decreased endurance, Decreased mobility, Difficulty walking, Impaired perceived functional ability, Decreased activity tolerance, Decreased safety awareness  Visit Diagnosis: Unsteadiness on feet  Difficulty in walking, not elsewhere classified  Muscle weakness (generalized)     Problem List Patient Active Problem List   Diagnosis Date Noted  . NPH (normal pressure hydrocephalus) ([place]) 07/30/2016  . Left leg weakness 04/20/2016  . Menopausal osteoporosis 03/04/2016  . Abnormal gait 12/18/2015  . Left foot drop 12/18/2015  . BP (high blood pressure) 08/24/2015  . Hydronephrosis, left 07/28/2015  . UPJ (ureteropelvic junction) obstruction 07/28/2015  . Lower urinary tract infectious disease 07/14/2015  . Hydronephrosis 07/14/2015    , PT, DPT 12/14/2018, 3:31 PM  [place] [facility] MAIN [facility] SERVICES [address] [place], [place],  [address] Phone: [phone]   Fax:  [phone]  Name: ANNALIYAH WILLIG MRN: [id number] Date of Birth: 11-15-1950

## 2018-12-16 ENCOUNTER — Ambulatory Visit: Payer: PPO | Admitting: Physical Therapy

## 2018-12-16 ENCOUNTER — Encounter: Payer: Self-pay | Admitting: Physical Therapy

## 2018-12-16 ENCOUNTER — Other Ambulatory Visit: Payer: Self-pay

## 2018-12-16 DIAGNOSIS — M6281 Muscle weakness (generalized): Secondary | ICD-10-CM

## 2018-12-16 DIAGNOSIS — R262 Difficulty in walking, not elsewhere classified: Secondary | ICD-10-CM

## 2018-12-16 DIAGNOSIS — R2681 Unsteadiness on feet: Secondary | ICD-10-CM

## 2018-12-16 NOTE — Therapy (Signed)
[place] MAIN [facility] SERVICES [address] [place], [place], [address] Phone: [phone]   Fax:  [phone]   Physical Therapy Treatment    Patient Details  Name: Abigail Hill MRN: [id number] Date of Birth: 1950/09/14 Referring Provider (PT): Dr. Metta Clines   Encounter Date: 12/16/2018  PT End of Session - 12/16/18 1305    Visit Number  39    Number of Visits  54    Date for PT Re-Evaluation  01/13/19    Authorization Type  goals addressed 12/16/2018    PT Start Time  1149    PT Stop Time  1233    PT Time Calculation (min)  44 min    Equipment Utilized During Treatment  Gait belt    Activity Tolerance  Patient limited by fatigue;Patient tolerated treatment well    Behavior During Therapy  [facility] for tasks assessed/performed       Past Medical History:  Diagnosis Date  . Abnormal gait 12/18/2015  . BP (high blood pressure) 08/24/2015  . Cancer ([place])    skin  . Constriction of ureter (postoperative) 07/28/2015  . Hydronephrosis 07/14/2015  . Hypertension   . Left foot drop 12/18/2015  . Recurrent UTI   . UPJ (ureteropelvic junction) obstruction 07/28/2015  . UTI (lower urinary tract infection) 07/14/2015    Past Surgical History:  Procedure Laterality Date  . broken foot  2012  . COLONOSCOPY WITH PROPOFOL N/A 06/30/2017   Procedure: COLONOSCOPY WITH PROPOFOL;  Surgeon: Lollie Sails, MD;  Location: [facility] ENDOSCOPY;  Service: Endoscopy;  Laterality: N/A;  . kidney repair  1985   repaired torn blood vessel in kidney    There were no vitals filed for this visit.  Subjective Assessment - 12/16/18 1157    Subjective  Patient reports she is having some pain after "pulling something" in her lower back. No new concerns or falls.    Pertinent History  68 yo Female reports instability with gait with difficulty walking. She states, "my left leg gives me trouble. It will  just plant and then I can't pick it up." Pt reports 2 falls in  last month. She reports when she falls it is often falling backwards; She was diagnosed with hydrocephalus and did a drain trial with no relief; She did not qualify for a shunt. Repeat imaging showed continued hydrocephalus that has not worsened; MRI of lumbar spine shows L5 pars defect with sacral insufficiency; She denies any back pain and reports no numbness in legs. She reports feeling frustration because they can't seem to find what's wrong with her. She does have urinary incontience; reports knowing when she has to go to the bathroom but can't get there fast enough; Patient has a walker and rollator; She uses both; She also has a manual wheelchair that she uses intermittently;     Limitations  Standing;Walking    How long can you sit comfortably?  NA    How long can you stand comfortably?  has pain in LLE with prolonged stooping; <10 min;     How long can you walk comfortably?  200 feet with assistance requiring several breaks;     Diagnostic tests  has had MRI and brain imaging;     Patient Stated Goals  "Be able to travel more, little sight seeing; walk short distances" Patient reports that she would like to be able to get rid of the walker.     Currently in Pain?  Yes  Pain Score  3     Pain Location  Back    Pain Orientation  Lower    Pain Descriptors / Indicators  Aching;Dull    Pain Type  Acute pain    Pain Onset  Today    Pain Frequency  Intermittent    Pain Relieving Factors  Heat    Multiple Pain Sites  No          Treatment:   Warmup on Nustep 5 min with MH for LBP.   Patient instructed in the following outcome measures and goal assessments: -43mT: 21.10 sec = 0.47 m/s, high fall risk, limited home ambulator, more impaired than last assessment on 11/10/18  Which was 0.61 m/s -BBT: 48/56, indicates high fall risk, no significant change from 11/10/18 which was 49/56 -Navigate 3 steps with no assist: requires 1 rail assist due to fear of falling.  -Gross LE MMT:  STRENGTH:   Graded on a 0-5 scale Muscle Group Left Right  Shoulder flex    Shoulder Abd    Shoulder Ext    Shoulder IR/ER    Elbow    Wrist/hand    Hip Flex 5 5  Hip Abd 4 4  Hip Add 4- 4-  Hip Ext 3- 3-  Hip IR/ER    Knee Flex 5 5  Knee Ext 5 5  Ankle DF 5 5  Ankle PF 5 5    Neuro Re-ed: Patient requires CGA during all standing interventions due to limited stability and patient fear of LOB. Cueing for reduction of UE support required as well as postural alignment for optimal stability within COM   Patient's condition has the potential to improve in response to therapy. Maximum improvement is yet to be obtained. The anticipated improvement is attainable and reasonable in a generally predictable time. Goals and plan of care altered to address balance, fall recovery and strengthening rather than improving gait speed. Patient reports that she feels more confident in her walking and balance compared to 1 month ago. Her objective measures indicate that her gait speed has not improved since last assessment, however her balance and strength have either improved or remained unchanged. Patient goals were adjusted accordingly. Patient would continue to benefit from skilled PT to address her fear of falling by improving strength and balance.      PT Education - 12/16/18 1324    Education Details  POC    Person(s) Educated  Patient    Methods  Explanation    Comprehension  Verbalized understanding       PT Short Term Goals - 09/23/18 1149      PT SHORT TERM GOAL #1   Title  Patient will be adherent to HEP at least 3x a week to improve functional strength and balance for better safety at home.    Baseline  08/12/18- doing exercise daily;    Time  4    Period  Weeks    Status  Achieved    Target Date  08/17/18      PT SHORT TERM GOAL #2   Title   Patient will deny any falls over past 4 weeks to demonstrate improved safety awareness at home and work.     Baseline  08/12/18- fell 1 week ago;  09/23/18: none recent;    Time  4    Period  Weeks    Status  Achieved    Target Date  08/17/18        PT Long Term Goals -  12/16/18 1311      PT LONG TERM GOAL #1   Title  Patient will increase Berg Balance score by > 6 points to demonstrate decreased fall risk during functional activities.    Time  8    Period  Weeks    Status  Achieved      PT LONG TERM GOAL #2   Title  Patient will increase 10 meter walk test to >0.8 m/s as to improve gait speed for better community ambulation and to reduce fall risk.    Baseline  12/16/2018: 0.47 m/s;    Time  8    Period  Weeks    Status  Deferred      PT LONG TERM GOAL #3   Title  Patient will be require no assist with ascend/descend 3 steps using Least restrictive assistive device to improve safety with entry/exit home.     Baseline  12/16/2018: ascends and descends stairs with RUE rail assist.    Time  4    Period  Weeks    Status  Partially Met    Target Date  01/13/19      PT LONG TERM GOAL #4   Title  Patient will increase BLE gross strength to 4+/5 as to improve functional strength for independent gait, increased standing tolerance and increased ADL ability.    Baseline  12/16/2018: BLE hip ext 3/5, BLE hip add 4-/5, BLE hip abd 4/5;    Time  4    Period  Weeks    Status  Partially Met    Target Date  01/13/19      PT LONG TERM GOAL #5   Title  Patient will increase Berg Balance score to >52/56 to demonstrate decreased fall risk during functional activities.    Baseline  12/16/2018: 48/56;    Time  4    Period  Weeks    Status  New    Target Date  01/13/19      PT LONG TERM GOAL #6   Title  Patient will be able to stand on one leg for >5 sec to indicate a decreased fall risk and increase ADL/IADL ability.    Baseline  12/16/2018: Unable to hold for more than 3 seconds, but able to remain standing.    Time  4    Period  Weeks    Status  New    Target Date  01/13/19      PT LONG TERM GOAL #7   Title  patient will  increase Mini Best test score to >20/28 to exhibit improved balance and reduce fall risk.    Time  4    Period  Weeks    Status  New    Target Date  01/13/19            Plan - 12/16/18 1304    Clinical Impression Statement  Patient's condition has the potential to improve in response to therapy. Maximum improvement is yet to be obtained. The anticipated improvement is attainable and reasonable in a generally predictable time. Goals and plan of care altered to address balance, fall recovery and strengthening rather than improving gait speed. Patient reports that she feels more confident in her walking and balance compared to 1 month ago. Her objective measures indicate that her gait speed has not improved since last assessment, however her balance and strength have either improved or remained unchanged. Patient goals were adjusted accordingly. Rather than focusing on gait speed, PT intervention will focus on  strength and balance with hopes of reducing fear of falling which will improve her overall functional mobility in the home. Patient would continue to benefit from skilled PT to address her fear of falling by improving strength and balance.    Rehab Potential  Good    PT Frequency  2x / week    PT Duration  4 weeks    PT Treatment/Interventions  Cryotherapy;Electrical Stimulation;Moist Heat;Gait training;Stair training;Functional mobility training;Therapeutic activities;Therapeutic exercise;Balance training;Neuromuscular re-education;Patient/family education;Energy conservation    PT Next Visit Plan  Continue to work on balance activities and strengthening at next visit, ABC scale and Mini Best    Consulted and Agree with Plan of Care  Patient       Patient will benefit from skilled therapeutic intervention in order to improve the following deficits and impairments:  Abnormal gait, Decreased balance, Decreased endurance, Decreased mobility, Difficulty walking, Impaired perceived functional  ability, Decreased activity tolerance, Decreased safety awareness  Visit Diagnosis: Unsteadiness on feet  Difficulty in walking, not elsewhere classified  Muscle weakness (generalized)     Problem List Patient Active Problem List   Diagnosis Date Noted  . NPH (normal pressure hydrocephalus) ([place]) 07/30/2016  . Left leg weakness 04/20/2016  . Menopausal osteoporosis 03/04/2016  . Abnormal gait 12/18/2015  . Left foot drop 12/18/2015  . BP (high blood pressure) 08/24/2015  . Hydronephrosis, left 07/28/2015  . UPJ (ureteropelvic junction) obstruction 07/28/2015  . Lower urinary tract infectious disease 07/14/2015  . Hydronephrosis 07/14/2015   Jeneen Rinks A. Rosana Hoes, SPT This entire session was performed under direct supervision and direction of a licensed therapist/therapist assistant . I have personally read, edited and approve of the note as written.  Trotter,Margaret PT, DPT 12/16/2018, 1:43 PM  [place] MAIN [facility] SERVICES [address] [place], [place], [address] Phone: [phone]   Fax:  [phone]  Name: Abigail Hill MRN: [id number] Date of Birth: 01/08/1951

## 2018-12-16 NOTE — Addendum Note (Signed)
Addended by: Blanche East E on: 12/16/2018 03:26 PM   Modules accepted: Orders

## 2018-12-21 ENCOUNTER — Ambulatory Visit: Payer: PPO | Admitting: Physical Therapy

## 2018-12-21 ENCOUNTER — Other Ambulatory Visit: Payer: Self-pay

## 2018-12-21 ENCOUNTER — Encounter: Payer: Self-pay | Admitting: Physical Therapy

## 2018-12-21 DIAGNOSIS — R262 Difficulty in walking, not elsewhere classified: Secondary | ICD-10-CM

## 2018-12-21 DIAGNOSIS — R2681 Unsteadiness on feet: Secondary | ICD-10-CM | POA: Diagnosis not present

## 2018-12-21 DIAGNOSIS — M6281 Muscle weakness (generalized): Secondary | ICD-10-CM

## 2018-12-21 NOTE — Patient Instructions (Signed)
Access Code: [email or web address]  URL: [email or web address]  Date: 12/21/2018  Prepared by: Blanche East   Exercises  Romberg Stance Eyes Closed on Foam Pad - 3 reps - 2 sets - 10 hold - 2x daily - 7x weekly  Romberg Stance on Foam Pad with Head Rotation - 10 reps - 1 sets - 1x daily - 7x weekly  Standing March - 10 reps - 2 sets - 2x daily - 7x weekly  Backward Walking with Counter Support - 10 reps - 1 sets - 1x daily - 7x weekly

## 2018-12-21 NOTE — Therapy (Signed)
[place] MAIN [facility] SERVICES [address] [place], [place], [address] Phone: [phone]   Fax:  [phone]  Physical Therapy Treatment Physical Therapy Progress Note   Dates of reporting period 11/10/18  to   12/21/18   Patient Details  Name: Abigail Hill MRN: [id number] Date of Birth: 06/18/1950 Referring Provider (PT): Dr. Metta Clines   Encounter Date: 12/21/2018  PT End of Session - 12/21/18 1216    Visit Number  40    Number of Visits  54    Date for PT Re-Evaluation  01/13/19    Authorization Type  goals addressed 12/16/2018    PT Start Time  1147    PT Stop Time  1230    PT Time Calculation (min)  43 min    Equipment Utilized During Treatment  Gait belt    Activity Tolerance  Patient limited by fatigue;Patient tolerated treatment well    Behavior During Therapy  [facility] for tasks assessed/performed       Past Medical History:  Diagnosis Date  . Abnormal gait 12/18/2015  . BP (high blood pressure) 08/24/2015  . Cancer ([place])    skin  . Constriction of ureter (postoperative) 07/28/2015  . Hydronephrosis 07/14/2015  . Hypertension   . Left foot drop 12/18/2015  . Recurrent UTI   . UPJ (ureteropelvic junction) obstruction 07/28/2015  . UTI (lower urinary tract infection) 07/14/2015    Past Surgical History:  Procedure Laterality Date  . broken foot  2012  . COLONOSCOPY WITH PROPOFOL N/A 06/30/2017   Procedure: COLONOSCOPY WITH PROPOFOL;  Surgeon: Lollie Sails, MD;  Location: [facility] ENDOSCOPY;  Service: Endoscopy;  Laterality: N/A;  . kidney repair  1985   repaired torn blood vessel in kidney    There were no vitals filed for this visit.  Subjective Assessment - 12/21/18 1215    Subjective  Pt reports doing about the same; Denies any new falls. reports minimal pain; States, "I do have a question, why have I been working on the 4 square so much but then wasn't tested on it last time?"    Pertinent History  68 yo Female  reports instability with gait with difficulty walking. She states, "my left leg gives me trouble. It will  just plant and then I can't pick it up." Pt reports 2 falls in last month. She reports when she falls it is often falling backwards; She was diagnosed with hydrocephalus and did a drain trial with no relief; She did not qualify for a shunt. Repeat imaging showed continued hydrocephalus that has not worsened; MRI of lumbar spine shows L5 pars defect with sacral insufficiency; She denies any back pain and reports no numbness in legs. She reports feeling frustration because they can't seem to find what's wrong with her. She does have urinary incontience; reports knowing when she has to go to the bathroom but can't get there fast enough; Patient has a walker and rollator; She uses both; She also has a manual wheelchair that she uses intermittently;     Limitations  Standing;Walking    How long can you sit comfortably?  NA    How long can you stand comfortably?  has pain in LLE with prolonged stooping; <10 min;     How long can you walk comfortably?  200 feet with assistance requiring several breaks;     Diagnostic tests  has had MRI and brain imaging;     Patient Stated Goals  "Be able to  travel more, little sight seeing; walk short distances" Patient reports that she would like to be able to get rid of the walker.     Currently in Pain?  No/denies    Pain Onset  Today    Multiple Pain Sites  No        [facility] PT Assessment - 12/22/18 0001      Observation/Other Assessments   Activities of Balance Confidence Scale (ABC Scale)   31% (The lower the score the greater the disability)      Mini-BESTest   Sit To Stand  Normal: Comes to stand without use of hands and stabilizes independently.    Rise to Toes  < 3 s.    Stand on one leg (left)  Moderate: < 20 s    Stand on one leg (right)  Moderate: < 20 s    Stand on one leg - lowest score  1    Compensatory Stepping Correction - Forward  No step, OR  would fall if not caught, OR falls spontaneously.    Compensatory Stepping Correction - Backward  No step, OR would fall if not caught, OR falls spontaneously.    Compensatory Stepping Correction - Left Lateral  Severe: Falls, or cannot step    Compensatory Stepping Correction - Right Lateral  Severe:  Falls, or cannot step    Stepping Corredtion Lateral - lowest score  0    Stance - Feet together, eyes open, firm surface   Normal: 30s    Stance - Feet together, eyes closed, foam surface   Severe: Unable    Incline - Eyes Closed  Severe: Unable    Change in Gait Speed  Moderate: Unable to change walking speed or signs of imbalance    Walk with head turns - Horizontal  Moderate: performs head turns with reduction in gait speed.    Walk with pivot turns  Severe: Cannot turn with feet close at any speed without imbalance.    Step over obstacles  Moderate: Steps over box but touches box OR displays cautious behavior by slowing gait.    Timed UP & GO with Dual Task  Moderate: Dual Task affects either counting OR walking (>10%) when compared to the TUG without Dual Task.    Mini-BEST total score  9         TREATMENT: PT instructed patient in Mini Best test and ABC scale to address fear of falling and balance;  See above;  Patient exhibits high fall risk and reports high fear of falling;  Re-educated patient in HEP: Standing on firm surface: Feet apart eyes open/closed 15 sec hold x2 reps; Feet together eyes open/closed x10 sec hold x1 rep; Feet together, head turns side/side, up/down x10 reps; Provided written handout for better adherence; educated patient in ways to improve safety awareness to reduce risk for falls;   Patient's condition has the potential to improve in response to therapy. Maximum improvement is yet to be obtained. The anticipated improvement is attainable and reasonable in a generally predictable time.  Patient reports adherence with HEP. She reports working diligently on  all her exercises and states that she feels like she is doing some better but is still fearful of falling;                       PT Education - 12/21/18 1216    Education Details  balance/HEP reinforced, POC    Person(s) Educated  Patient    Methods  Explanation;Verbal cues    Comprehension  Verbalized understanding;Returned demonstration;Verbal cues required;Need further instruction       PT Short Term Goals - 12/22/18 0816      PT SHORT TERM GOAL #1   Title  Patient will be adherent to HEP at least 3x a week to improve functional strength and balance for better safety at home.    Baseline  08/12/18- doing exercise daily;    Time  4    Period  Weeks    Status  Achieved    Target Date  08/17/18      PT SHORT TERM GOAL #2   Title   Patient will deny any falls over past 4 weeks to demonstrate improved safety awareness at home and work.     Baseline  08/12/18- fell 1 week ago; 09/23/18: none recent;    Time  4    Period  Weeks    Status  Achieved    Target Date  08/17/18        PT Long Term Goals - 12/22/18 0816      PT LONG TERM GOAL #1   Title  Patient will increase Berg Balance score by > 6 points to demonstrate decreased fall risk during functional activities.    Time  8    Period  Weeks    Status  Achieved    Target Date  01/13/19      PT LONG TERM GOAL #2   Title  Patient will increase 10 meter walk test to >0.8 m/s as to improve gait speed for better community ambulation and to reduce fall risk.    Baseline  12/16/2018: 0.47 m/s;    Time  8    Period  Weeks    Status  Deferred    Target Date  01/13/19      PT LONG TERM GOAL #3   Title  Patient will be require no assist with ascend/descend 3 steps using Least restrictive assistive device to improve safety with entry/exit home.     Baseline  12/16/2018: ascends and descends stairs with RUE rail assist.    Time  4    Period  Weeks    Status  Partially Met    Target Date  01/13/19      PT LONG  TERM GOAL #4   Title  Patient will increase BLE gross strength to 4+/5 as to improve functional strength for independent gait, increased standing tolerance and increased ADL ability.    Baseline  12/16/2018: BLE hip ext 3/5, BLE hip add 4-/5, BLE hip abd 4/5;    Time  4    Period  Weeks    Status  Partially Met    Target Date  01/13/19      PT LONG TERM GOAL #5   Title  Patient will increase Berg Balance score to >52/56 to demonstrate decreased fall risk during functional activities.    Baseline  12/16/2018: 48/56;    Time  4    Period  Weeks    Status  Partially Met    Target Date  01/13/19      PT LONG TERM GOAL #6   Title  Patient will be able to stand on one leg for >5 sec to indicate a decreased fall risk and increase ADL/IADL ability.    Baseline  12/16/2018: Unable to hold for more than 3 seconds, but able to remain standing.    Time  4    Period  Weeks  Status  Not Met    Target Date  01/13/19      PT LONG TERM GOAL #7   Title  patient will increase Mini Best test score to >18/28 to exhibit improved balance and reduce fall risk.    Time  4    Period  Weeks    Status  Partially Met    Target Date  01/13/19            Plan - 12/21/18 1259    Clinical Impression Statement  Patient motivated and participated well within session. Patient instructed in Mini Best test and ABC scale to address goals. Patient does test as a high fall risk and exhibits high fear of falling. patient instructed in balance exercise as part of HEP; She does require cues for proper positioning for better balance control and safety. She would benefit from additional skilled PT intervention to improve strength, balance and safety;    Rehab Potential  Good    PT Frequency  2x / week    PT Duration  4 weeks    PT Treatment/Interventions  Cryotherapy;Electrical Stimulation;Moist Heat;Gait training;Stair training;Functional mobility training;Therapeutic activities;Therapeutic exercise;Balance  training;Neuromuscular re-education;Patient/family education;Energy conservation    PT Next Visit Plan  Continue to work on balance activities and strengthening at next visit, ABC scale and Mini Best    Consulted and Agree with Plan of Care  Patient       Patient will benefit from skilled therapeutic intervention in order to improve the following deficits and impairments:  Abnormal gait, Decreased balance, Decreased endurance, Decreased mobility, Difficulty walking, Impaired perceived functional ability, Decreased activity tolerance, Decreased safety awareness  Visit Diagnosis: Unsteadiness on feet  Difficulty in walking, not elsewhere classified  Muscle weakness (generalized)     Problem List Patient Active Problem List   Diagnosis Date Noted  . NPH (normal pressure hydrocephalus) ([place]) 07/30/2016  . Left leg weakness 04/20/2016  . Menopausal osteoporosis 03/04/2016  . Abnormal gait 12/18/2015  . Left foot drop 12/18/2015  . BP (high blood pressure) 08/24/2015  . Hydronephrosis, left 07/28/2015  . UPJ (ureteropelvic junction) obstruction 07/28/2015  . Lower urinary tract infectious disease 07/14/2015  . Hydronephrosis 07/14/2015     Trotter,Margaret PT, DPT 12/22/2018, 8:18 AM  [place] MAIN [facility] SERVICES [address] [place], [place], [address] Phone: [phone]   Fax:  [phone]  Name: Abigail Hill MRN: [id number] Date of Birth: August 16, 1950

## 2018-12-23 ENCOUNTER — Encounter: Payer: Self-pay | Admitting: Physical Therapy

## 2018-12-23 ENCOUNTER — Ambulatory Visit: Payer: PPO | Admitting: Physical Therapy

## 2018-12-23 ENCOUNTER — Other Ambulatory Visit: Payer: Self-pay

## 2018-12-23 DIAGNOSIS — R262 Difficulty in walking, not elsewhere classified: Secondary | ICD-10-CM

## 2018-12-23 DIAGNOSIS — R2681 Unsteadiness on feet: Secondary | ICD-10-CM

## 2018-12-23 DIAGNOSIS — M6281 Muscle weakness (generalized): Secondary | ICD-10-CM

## 2018-12-23 NOTE — Therapy (Signed)
[place] MAIN [facility] SERVICES [address] [place], [place], [address] Phone: [phone]   Fax:  [phone]  Physical Therapy Treatment  Patient Details  Name: Abigail Hill MRN: [id number] Date of Birth: 02-Aug-1950 Referring Provider (PT): Dr. Metta Clines   Encounter Date: 12/23/2018  PT End of Session - 12/23/18 1146    Visit Number  41    Number of Visits  54    Date for PT Re-Evaluation  01/13/19    Authorization Type  goals addressed 12/16/2018    PT Start Time  1147    PT Stop Time  1230    PT Time Calculation (min)  43 min    Equipment Utilized During Treatment  Gait belt    Activity Tolerance  Patient limited by fatigue;Patient tolerated treatment well    Behavior During Therapy  [facility] for tasks assessed/performed       Past Medical History:  Diagnosis Date  . Abnormal gait 12/18/2015  . BP (high blood pressure) 08/24/2015  . Cancer ([place])    skin  . Constriction of ureter (postoperative) 07/28/2015  . Hydronephrosis 07/14/2015  . Hypertension   . Left foot drop 12/18/2015  . Recurrent UTI   . UPJ (ureteropelvic junction) obstruction 07/28/2015  . UTI (lower urinary tract infection) 07/14/2015    Past Surgical History:  Procedure Laterality Date  . broken foot  2012  . COLONOSCOPY WITH PROPOFOL N/A 06/30/2017   Procedure: COLONOSCOPY WITH PROPOFOL;  Surgeon: Lollie Sails, MD;  Location: [facility] ENDOSCOPY;  Service: Endoscopy;  Laterality: N/A;  . kidney repair  1985   repaired torn blood vessel in kidney    There were no vitals filed for this visit.  Subjective Assessment - 12/23/18 1149    Subjective  Patient reports that she is doing well today, no new falls or concerns. Reports compliance with HEP, some difficulty with a few exercises.    Pertinent History  68 yo Female reports instability with gait with difficulty walking. She states, "my left leg gives me trouble. It will  just plant and then I can't pick it up." Pt  reports 2 falls in last month. She reports when she falls it is often falling backwards; She was diagnosed with hydrocephalus and did a drain trial with no relief; She did not qualify for a shunt. Repeat imaging showed continued hydrocephalus that has not worsened; MRI of lumbar spine shows L5 pars defect with sacral insufficiency; She denies any back pain and reports no numbness in legs. She reports feeling frustration because they can't seem to find what's wrong with her. She does have urinary incontience; reports knowing when she has to go to the bathroom but can't get there fast enough; Patient has a walker and rollator; She uses both; She also has a manual wheelchair that she uses intermittently;     Limitations  Standing;Walking    How long can you sit comfortably?  NA    How long can you stand comfortably?  has pain in LLE with prolonged stooping; <10 min;     How long can you walk comfortably?  200 feet with assistance requiring several breaks;     Diagnostic tests  has had MRI and brain imaging;     Patient Stated Goals  "Be able to travel more, little sight seeing; walk short distances" Patient reports that she would like to be able to get rid of the walker.     Currently in Pain?  No/denies  Pain Score  0-No pain    Pain Onset  Today       Neuro Re-ed: Patient requires CGA during all standing interventions due to limited stability and patient fear of LOB. Cueing for reduction of UE support required as well as postural alignment for optimal stability within COM   Treatment:  NMRE:  -Heel/toe raises x3 min with BUE support, followed by SUE support, fingertip support, no UE support; to facilitate improved ankle strategies in standing, cues to maintain upright posture to limit hip flexion.   In // bars:  -Small rocker-board anterior/posterior tilts x7 min with BUE support, followed by SUE support, fingertip support, no UE support: cues to limit hip flexion by maintaining upright  posture  -Anterior/posterior leaning x15 on firm surface; cues to shift weight onto toes/heels to reproduce rocker-board motion.  -Small rocker-board lateral tilts x5 min with BUE support, followed by SUE support, fingertip support, no UE support; cues to bend at the knees and limit lateral bending at trunk and neck, utilizing mirror to watch for upright posture.   -Lateral leaning side/side x15 on firm surface; cues to bend at knees  In kitchen:  -Stand in front of oven and move mug from left side <> right side on counter x10; cues to reach as far as possible while bending at the knees instead of the hips.  -Stand at counter and move mug from counter <> top shelf cabinet x10; cues to shift weight anteriorly onto toes to utilize ankle strategies instead of hip strategies.  Response to Treatment: Patient demonstrates good motivation throughout today's session. Patient was able to perform advanced balance intervention with a focus on ankle strategies and weight shifting, required frequent UE assist and min VC for posture correction. Patient was challenged by shifting weight on rocker-board and firm surface with fingertip/no UE support, which improved with repetition and gradually decreasing UE support. She demonstrated good ability to reproduce ankle strategies for kitchen activities with self-reported decrease in difficulty compared to prior function. Patient would continue to benefit from skilled PT to address the deficits outlined in this note.   PT Education - 12/23/18 1301    Education Details  Balance, HEP    Person(s) Educated  Patient    Methods  Explanation;Verbal cues;Tactile cues    Comprehension  Verbalized understanding;Verbal cues required;Tactile cues required;Need further instruction       PT Short Term Goals - 12/22/18 0816      PT SHORT TERM GOAL #1   Title  Patient will be adherent to HEP at least 3x a week to improve functional strength and balance for better safety at  home.    Baseline  08/12/18- doing exercise daily;    Time  4    Period  Weeks    Status  Achieved    Target Date  08/17/18      PT SHORT TERM GOAL #2   Title   Patient will deny any falls over past 4 weeks to demonstrate improved safety awareness at home and work.     Baseline  08/12/18- fell 1 week ago; 09/23/18: none recent;    Time  4    Period  Weeks    Status  Achieved    Target Date  08/17/18        PT Long Term Goals - 12/22/18 0816      PT LONG TERM GOAL #1   Title  Patient will increase Berg Balance score by > 6 points to demonstrate decreased  fall risk during functional activities.    Time  8    Period  Weeks    Status  Achieved    Target Date  01/13/19      PT LONG TERM GOAL #2   Title  Patient will increase 10 meter walk test to >0.8 m/s as to improve gait speed for better community ambulation and to reduce fall risk.    Baseline  12/16/2018: 0.47 m/s;    Time  8    Period  Weeks    Status  Deferred    Target Date  01/13/19      PT LONG TERM GOAL #3   Title  Patient will be require no assist with ascend/descend 3 steps using Least restrictive assistive device to improve safety with entry/exit home.     Baseline  12/16/2018: ascends and descends stairs with RUE rail assist.    Time  4    Period  Weeks    Status  Partially Met    Target Date  01/13/19      PT LONG TERM GOAL #4   Title  Patient will increase BLE gross strength to 4+/5 as to improve functional strength for independent gait, increased standing tolerance and increased ADL ability.    Baseline  12/16/2018: BLE hip ext 3/5, BLE hip add 4-/5, BLE hip abd 4/5;    Time  4    Period  Weeks    Status  Partially Met    Target Date  01/13/19      PT LONG TERM GOAL #5   Title  Patient will increase Berg Balance score to >52/56 to demonstrate decreased fall risk during functional activities.    Baseline  12/16/2018: 48/56;    Time  4    Period  Weeks    Status  Partially Met    Target Date  01/13/19       PT LONG TERM GOAL #6   Title  Patient will be able to stand on one leg for >5 sec to indicate a decreased fall risk and increase ADL/IADL ability.    Baseline  12/16/2018: Unable to hold for more than 3 seconds, but able to remain standing.    Time  4    Period  Weeks    Status  Not Met    Target Date  01/13/19      PT LONG TERM GOAL #7   Title  patient will increase Mini Best test score to >18/28 to exhibit improved balance and reduce fall risk.    Time  4    Period  Weeks    Status  Partially Met    Target Date  01/13/19            Plan - 12/23/18 1302    Clinical Impression Statement  Patient demonstrates good motivation throughout today's session. Patient was able to perform advanced balance intervention with a focus on ankle strategies and weight shifting, required frequent UE assist and min VC for posture correction. Patient was challenged by shifting weight on rocker-board and firm surface with fingertip/no UE support, which improved with repetition and gradually decreasing UE support. She demonstrated good ability to reproduce ankle strategies for kitchen activities with self-reported decrease in difficulty compared to prior function. Patient would continue to benefit from skilled PT to address the deficits outlined in this note.    Rehab Potential  Good    PT Frequency  2x / week    PT Duration  4 weeks  PT Treatment/Interventions  Cryotherapy;Electrical Stimulation;Moist Heat;Gait training;Stair training;Functional mobility training;Therapeutic activities;Therapeutic exercise;Balance training;Neuromuscular re-education;Patient/family education;Energy conservation    PT Next Visit Plan  Continue/progress ankle strategies and stepping correction, implement ADL training    Consulted and Agree with Plan of Care  Patient       Patient will benefit from skilled therapeutic intervention in order to improve the following deficits and impairments:  Abnormal gait, Decreased  balance, Decreased endurance, Decreased mobility, Difficulty walking, Impaired perceived functional ability, Decreased activity tolerance, Decreased safety awareness  Visit Diagnosis: Unsteadiness on feet  Difficulty in walking, not elsewhere classified  Muscle weakness (generalized)     Problem List Patient Active Problem List   Diagnosis Date Noted  . NPH (normal pressure hydrocephalus) ([place]) 07/30/2016  . Left leg weakness 04/20/2016  . Menopausal osteoporosis 03/04/2016  . Abnormal gait 12/18/2015  . Left foot drop 12/18/2015  . BP (high blood pressure) 08/24/2015  . Hydronephrosis, left 07/28/2015  . UPJ (ureteropelvic junction) obstruction 07/28/2015  . Lower urinary tract infectious disease 07/14/2015  . Hydronephrosis 07/14/2015   Jeneen Rinks A. Rosana Hoes, SPT This entire session was performed under direct supervision and direction of a licensed therapist/therapist assistant . I have personally read, edited and approve of the note as written.  Trotter,Margaret PT, DPT 12/23/2018, 3:39 PM  [place] MAIN [facility] SERVICES [address] [place], [place], [address] Phone: [phone]   Fax:  [phone]  Name: JOSELY MOFFAT MRN: [id number] Date of Birth: March 23, 1950

## 2018-12-28 ENCOUNTER — Other Ambulatory Visit: Payer: Self-pay

## 2018-12-28 ENCOUNTER — Ambulatory Visit: Payer: PPO | Admitting: Physical Therapy

## 2018-12-28 ENCOUNTER — Encounter: Payer: Self-pay | Admitting: Physical Therapy

## 2018-12-28 DIAGNOSIS — R2681 Unsteadiness on feet: Secondary | ICD-10-CM | POA: Diagnosis not present

## 2018-12-28 DIAGNOSIS — M6281 Muscle weakness (generalized): Secondary | ICD-10-CM

## 2018-12-28 DIAGNOSIS — R262 Difficulty in walking, not elsewhere classified: Secondary | ICD-10-CM

## 2018-12-28 NOTE — Therapy (Signed)
[place] MAIN [facility] SERVICES [address] [place], [place], [address] Phone: [phone]   Fax:  [phone]  Physical Therapy Treatment  Patient Details  Name: Abigail Hill MRN: [id number] Date of Birth: [birth year] Referring Provider (PT): Dr. Metta Clines   Encounter Date: 12/28/2018  PT End of Session - 12/28/18 1246    Visit Number  42    Number of Visits  54    Date for PT Re-Evaluation  01/13/19    Authorization Type  goals addressed 12/16/2018    PT Start Time  1148    PT Stop Time  1230    PT Time Calculation (min)  42 min    Equipment Utilized During Treatment  Gait belt    Activity Tolerance  Patient limited by fatigue;Patient tolerated treatment well    Behavior During Therapy  [facility] for tasks assessed/performed       Past Medical History:  Diagnosis Date  . Abnormal gait 12/18/2015  . BP (high blood pressure) 08/24/2015  . Cancer ([place])    skin  . Constriction of ureter (postoperative) 07/28/2015  . Hydronephrosis 07/14/2015  . Hypertension   . Left foot drop 12/18/2015  . Recurrent UTI   . UPJ (ureteropelvic junction) obstruction 07/28/2015  . UTI (lower urinary tract infection) 07/14/2015    Past Surgical History:  Procedure Laterality Date  . broken foot  2012  . COLONOSCOPY WITH PROPOFOL N/A 06/30/2017   Procedure: COLONOSCOPY WITH PROPOFOL;  Surgeon: Lollie Sails, MD;  Location: [facility] ENDOSCOPY;  Service: Endoscopy;  Laterality: N/A;  . kidney repair  1985   repaired torn blood vessel in kidney    There were no vitals filed for this visit.  Subjective Assessment - 12/28/18 1151    Subjective  Patient reports she was in the kitchen helping her husband and had a near-fall, landed by sitting into the chair. Otherwise she is doing well today, no pain this date.    Pertinent History  68 yo Female reports instability with gait with difficulty walking. She states, "my left leg gives me trouble. It will  just plant and  then I can't pick it up." Pt reports 2 falls in last month. She reports when she falls it is often falling backwards; She was diagnosed with hydrocephalus and did a drain trial with no relief; She did not qualify for a shunt. Repeat imaging showed continued hydrocephalus that has not worsened; MRI of lumbar spine shows L5 pars defect with sacral insufficiency; She denies any back pain and reports no numbness in legs. She reports feeling frustration because they can't seem to find what's wrong with her. She does have urinary incontience; reports knowing when she has to go to the bathroom but can't get there fast enough; Patient has a walker and rollator; She uses both; She also has a manual wheelchair that she uses intermittently;     Limitations  Standing;Walking    How long can you sit comfortably?  NA    How long can you stand comfortably?  has pain in LLE with prolonged stooping; <10 min;     How long can you walk comfortably?  200 feet with assistance requiring several breaks;     Diagnostic tests  has had MRI and brain imaging;     Patient Stated Goals  "Be able to travel more, little sight seeing; walk short distances" Patient reports that she would like to be able to get rid of the walker.  Currently in Pain?  No/denies    Pain Score  0-No pain    Pain Onset  Today    Multiple Pain Sites  No      Neuro Re-ed: Patient requires CGA during all standing interventions due to limited stability and patient fear of LOB. Cueing for reduction of UE support required as well as postural alignment for optimal stability within COM   Treatment:  NMRE:  Semi-tandem stance on airex foam 2x30 sec hold BLE forward, unsupported Semi-tandem stance on airex foam with head turns x10 side/side, each LE forward, unsupported; postural sway in opposite direction of head turn, occasional LOB and use of rail support for stability.   Target touches with cone outside BOS feet together on airex foam x1 min, progress  to semi-tandem x1 min each LE forward; Occasional UE support when in semi-tandem, cues to go slowly and focus on maintaining balance before reaching. Patient has tendency to focus vision on one spot and use peripheral vision to locate targets to limit postural sway during head movements.   Heel-toe raises x15; gradually reduce HHA to 0  Lean anteriorly with 2 HHA with PT x15; cues to limit bending at hips and to lean forward onto her toes to utilize ankle strategies.  Anterior trust fall (miniBEST) x10 by leaning into PT hands as resistance with resistance taken away to facilitate corrective stepping; cues to place body weight into toes and hands and keep hips neutral position; patient takes one step when slightly leaning and resistance taken away, takes 2-3 steps when leaning more BW and resistance taken away.   Pt educated throughout session about proper posture and technique with exercises. Improved exercise technique, movement at target joints, use of target muscles after min to mod verbal, visual, tactile cues.   Patient demonstrates excellent motivation throughout today's session. Patient was able to perform progressions to balance intervention with a focus on improving ankle strategies, required min VC for positioning and demonstration. Patient continues to be challenged by correctional stepping, which improved with min to mod VC and TC for positioning, demonstrations, and repetition, and she was able to make 2-3 correctional steps when leaning forward and resistance is taken away. Patient would continue to benefit from skilled PT to address the deficits outlined in this note.          PT Education - 12/28/18 1245    Education Details  Balance    Person(s) Educated  Patient    Methods  Explanation;Demonstration;Tactile cues;Verbal cues    Comprehension  Verbalized understanding;Returned demonstration;Verbal cues required;Tactile cues required;Need further instruction       PT Short  Term Goals - 12/22/18 0816      PT SHORT TERM GOAL #1   Title  Patient will be adherent to HEP at least 3x a week to improve functional strength and balance for better safety at home.    Baseline  08/12/18- doing exercise daily;    Time  4    Period  Weeks    Status  Achieved    Target Date  08/17/18      PT SHORT TERM GOAL #2   Title   Patient will deny any falls over past 4 weeks to demonstrate improved safety awareness at home and work.     Baseline  08/12/18- fell 1 week ago; 09/23/18: none recent;    Time  4    Period  Weeks    Status  Achieved    Target Date  08/17/18  PT Long Term Goals - 12/22/18 0816      PT LONG TERM GOAL #1   Title  Patient will increase Berg Balance score by > 6 points to demonstrate decreased fall risk during functional activities.    Time  8    Period  Weeks    Status  Achieved    Target Date  01/13/19      PT LONG TERM GOAL #2   Title  Patient will increase 10 meter walk test to >0.8 m/s as to improve gait speed for better community ambulation and to reduce fall risk.    Baseline  12/16/2018: 0.47 m/s;    Time  8    Period  Weeks    Status  Deferred    Target Date  01/13/19      PT LONG TERM GOAL #3   Title  Patient will be require no assist with ascend/descend 3 steps using Least restrictive assistive device to improve safety with entry/exit home.     Baseline  12/16/2018: ascends and descends stairs with RUE rail assist.    Time  4    Period  Weeks    Status  Partially Met    Target Date  01/13/19      PT LONG TERM GOAL #4   Title  Patient will increase BLE gross strength to 4+/5 as to improve functional strength for independent gait, increased standing tolerance and increased ADL ability.    Baseline  12/16/2018: BLE hip ext 3/5, BLE hip add 4-/5, BLE hip abd 4/5;    Time  4    Period  Weeks    Status  Partially Met    Target Date  01/13/19      PT LONG TERM GOAL #5   Title  Patient will increase Berg Balance score to >52/56  to demonstrate decreased fall risk during functional activities.    Baseline  12/16/2018: 48/56;    Time  4    Period  Weeks    Status  Partially Met    Target Date  01/13/19      PT LONG TERM GOAL #6   Title  Patient will be able to stand on one leg for >5 sec to indicate a decreased fall risk and increase ADL/IADL ability.    Baseline  12/16/2018: Unable to hold for more than 3 seconds, but able to remain standing.    Time  4    Period  Weeks    Status  Not Met    Target Date  01/13/19      PT LONG TERM GOAL #7   Title  patient will increase Mini Best test score to >18/28 to exhibit improved balance and reduce fall risk.    Time  4    Period  Weeks    Status  Partially Met    Target Date  01/13/19            Plan - 12/28/18 1244    Clinical Impression Statement  Patient demonstrates excellent motivation throughout today's session. Patient was able to perform progressions to balance intervention with a focus on improving ankle strategies, required min VC for positioning and demonstration. Patient continues to be challenged by correctional stepping, which improved with min to mod VC and TC for positioning, demonstrations, and repetition, and she was able to make 2-3 correctional steps when leaning forward and resistance is taken away. Patient would continue to benefit from skilled PT to address the deficits outlined in this  note.    Rehab Potential  Good    PT Frequency  2x / week    PT Duration  4 weeks    PT Treatment/Interventions  Cryotherapy;Electrical Stimulation;Moist Heat;Gait training;Stair training;Functional mobility training;Therapeutic activities;Therapeutic exercise;Balance training;Neuromuscular re-education;Patient/family education;Energy conservation    PT Next Visit Plan  Continue/progress ankle strategies and stepping correction, implement ADL training    Consulted and Agree with Plan of Care  Patient       Patient will benefit from skilled therapeutic  intervention in order to improve the following deficits and impairments:  Abnormal gait, Decreased balance, Decreased endurance, Decreased mobility, Difficulty walking, Impaired perceived functional ability, Decreased activity tolerance, Decreased safety awareness  Visit Diagnosis: Difficulty in walking, not elsewhere classified  Muscle weakness (generalized)  Unsteadiness on feet     Problem List Patient Active Problem List   Diagnosis Date Noted  . NPH (normal pressure hydrocephalus) ([place]) 07/30/2016  . Left leg weakness 04/20/2016  . Menopausal osteoporosis 03/04/2016  . Abnormal gait 12/18/2015  . Left foot drop 12/18/2015  . BP (high blood pressure) 08/24/2015  . Hydronephrosis, left 07/28/2015  . UPJ (ureteropelvic junction) obstruction 07/28/2015  . Lower urinary tract infectious disease 07/14/2015  . Hydronephrosis 07/14/2015   Jeneen Rinks A. Rosana Hoes, SPT This entire session was performed under direct supervision and direction of a licensed therapist/therapist assistant . I have personally read, edited and approve of the note as written.  Trotter,Margaret PT, DPT 12/28/2018, 3:22 PM  [place] MAIN [facility] SERVICES [address] [place], [place], [address] Phone: [phone]   Fax:  [phone]  Name: Abigail Hill MRN: [id number] Date of Birth: Aug 22, 1950

## 2018-12-30 ENCOUNTER — Encounter: Payer: Self-pay | Admitting: Physical Therapy

## 2018-12-30 ENCOUNTER — Ambulatory Visit: Payer: PPO | Admitting: Physical Therapy

## 2018-12-30 ENCOUNTER — Other Ambulatory Visit: Payer: Self-pay

## 2018-12-30 DIAGNOSIS — M6281 Muscle weakness (generalized): Secondary | ICD-10-CM

## 2018-12-30 DIAGNOSIS — R262 Difficulty in walking, not elsewhere classified: Secondary | ICD-10-CM

## 2018-12-30 DIAGNOSIS — R2681 Unsteadiness on feet: Secondary | ICD-10-CM | POA: Diagnosis not present

## 2018-12-30 DIAGNOSIS — R269 Unspecified abnormalities of gait and mobility: Secondary | ICD-10-CM

## 2018-12-30 NOTE — Therapy (Signed)
[place] MAIN [facility] SERVICES [address] [place], [place], [address] Phone: [phone]   Fax:  [phone]  Physical Therapy Treatment  Patient Details  Name: Abigail Hill MRN: [id number] Date of Birth: [birth year] Referring Provider (PT): Dr. Metta Clines   Encounter Date: 12/30/2018  PT End of Session - 12/30/18 1142    Visit Number  43    Number of Visits  54    Date for PT Re-Evaluation  01/13/19    Authorization Type  goals addressed 12/16/2018    PT Start Time  1145    PT Stop Time  1230    PT Time Calculation (min)  45 min    Equipment Utilized During Treatment  Gait belt    Activity Tolerance  Patient limited by fatigue;Patient tolerated treatment well    Behavior During Therapy  [facility] for tasks assessed/performed       Past Medical History:  Diagnosis Date  . Abnormal gait 12/18/2015  . BP (high blood pressure) 08/24/2015  . Cancer ([place])    skin  . Constriction of ureter (postoperative) 07/28/2015  . Hydronephrosis 07/14/2015  . Hypertension   . Left foot drop 12/18/2015  . Recurrent UTI   . UPJ (ureteropelvic junction) obstruction 07/28/2015  . UTI (lower urinary tract infection) 07/14/2015    Past Surgical History:  Procedure Laterality Date  . broken foot  2012  . COLONOSCOPY WITH PROPOFOL N/A 06/30/2017   Procedure: COLONOSCOPY WITH PROPOFOL;  Surgeon: Lollie Sails, MD;  Location: [facility] ENDOSCOPY;  Service: Endoscopy;  Laterality: N/A;  . kidney repair  1985   repaired torn blood vessel in kidney    There were no vitals filed for this visit.  Subjective Assessment - 12/30/18 1256    Subjective  Patient claims she is doing well today, felt really good about her last session with PT. No reports of pain or new falls/LOB.    Pertinent History  68 yo Female reports instability with gait with difficulty walking. She states, "my left leg gives me trouble. It will  just plant and then I can't pick it up." Pt reports 2  falls in last month. She reports when she falls it is often falling backwards; She was diagnosed with hydrocephalus and did a drain trial with no relief; She did not qualify for a shunt. Repeat imaging showed continued hydrocephalus that has not worsened; MRI of lumbar spine shows L5 pars defect with sacral insufficiency; She denies any back pain and reports no numbness in legs. She reports feeling frustration because they can't seem to find what's wrong with her. She does have urinary incontience; reports knowing when she has to go to the bathroom but can't get there fast enough; Patient has a walker and rollator; She uses both; She also has a manual wheelchair that she uses intermittently;     Limitations  Standing;Walking    How long can you sit comfortably?  NA    How long can you stand comfortably?  has pain in LLE with prolonged stooping; <10 min;     How long can you walk comfortably?  200 feet with assistance requiring several breaks;     Diagnostic tests  has had MRI and brain imaging;     Patient Stated Goals  "Be able to travel more, little sight seeing; walk short distances" Patient reports that she would like to be able to get rid of the walker.     Currently in Pain?  No/denies  Pain Score  0-No pain    Pain Onset  Today      Neuro Re-ed: Patient requires CGA during all standing interventions due to limited stability and patient fear of LOB. Cueing for reduction of UE support required as well as postural alignment for optimal stability within COM  TREATMENT:  NMRE:  -Small rockerboard fwd/bwd and side/side x2 min each, gradually decreasing HHA to 0; able to perform only small amplitude side/side without LOB this date, but can maintain upright posture with minimal trunk and head compensation.  -Heel-toe raises x20 unsupported; increased DF and PF AROM with repetition  -Balloon pass with feet together on airex foam 2x1 min; demonstrates more frequent LOB posteriorly when looking  and reaching overhead, cues to use ankles and hips to regain COB.   -SLS hold with 2-1-0 HHA BLE x5 min total, able to hold SLS for 2 seconds max with 0 HHA  -Soccer kicks x15 BLE; cues for high velocity kick and to bring feet closer together to prevent significant lateral LOB caused by wide stance  -Anterior trust fall (miniBEST) x5 by leaning into PT hands as resistance with resistance taken away to facilitate corrective stepping; cues to "fall" into PT hands to facilitate increased anterior shift in BW outside BOS, patient able to take 1-2 big steps to correct balance.   Pt educated throughout session about proper posture and technique with exercises. Improved exercise technique, movement at target joints, use of target muscles after min to mod verbal, visual, tactile cues.   Patient demonstrates excellent motivation throughout today's session. Patient was able to perform progressed weight shift interventions with a focus on ankle strategies, required repetition and min VC for improved form. Patient continues to be challenged by activities involving SLS, which improved with repetition, demonstration, min TC and VC for sequencing and posture. She also demonstrated improvements this date with corrective stepping compared to last session, able to utilize 1-2 larger steps after anterior displacement of balance. Patient would continue to benefit from skilled PT to address the deficits outlined in this note.     PT Education - 12/30/18 1141    Education Details  Balance    Person(s) Educated  Patient    Methods  Explanation;Demonstration;Tactile cues;Verbal cues    Comprehension  Verbalized understanding;Returned demonstration;Verbal cues required;Tactile cues required;Need further instruction       PT Short Term Goals - 12/22/18 0816      PT SHORT TERM GOAL #1   Title  Patient will be adherent to HEP at least 3x a week to improve functional strength and balance for better safety at home.     Baseline  08/12/18- doing exercise daily;    Time  4    Period  Weeks    Status  Achieved    Target Date  08/17/18      PT SHORT TERM GOAL #2   Title   Patient will deny any falls over past 4 weeks to demonstrate improved safety awareness at home and work.     Baseline  08/12/18- fell 1 week ago; 09/23/18: none recent;    Time  4    Period  Weeks    Status  Achieved    Target Date  08/17/18        PT Long Term Goals - 12/22/18 0816      PT LONG TERM GOAL #1   Title  Patient will increase Berg Balance score by > 6 points to demonstrate decreased fall risk during functional  activities.    Time  8    Period  Weeks    Status  Achieved    Target Date  01/13/19      PT LONG TERM GOAL #2   Title  Patient will increase 10 meter walk test to >0.8 m/s as to improve gait speed for better community ambulation and to reduce fall risk.    Baseline  12/16/2018: 0.47 m/s;    Time  8    Period  Weeks    Status  Deferred    Target Date  01/13/19      PT LONG TERM GOAL #3   Title  Patient will be require no assist with ascend/descend 3 steps using Least restrictive assistive device to improve safety with entry/exit home.     Baseline  12/16/2018: ascends and descends stairs with RUE rail assist.    Time  4    Period  Weeks    Status  Partially Met    Target Date  01/13/19      PT LONG TERM GOAL #4   Title  Patient will increase BLE gross strength to 4+/5 as to improve functional strength for independent gait, increased standing tolerance and increased ADL ability.    Baseline  12/16/2018: BLE hip ext 3/5, BLE hip add 4-/5, BLE hip abd 4/5;    Time  4    Period  Weeks    Status  Partially Met    Target Date  01/13/19      PT LONG TERM GOAL #5   Title  Patient will increase Berg Balance score to >52/56 to demonstrate decreased fall risk during functional activities.    Baseline  12/16/2018: 48/56;    Time  4    Period  Weeks    Status  Partially Met    Target Date  01/13/19      PT  LONG TERM GOAL #6   Title  Patient will be able to stand on one leg for >5 sec to indicate a decreased fall risk and increase ADL/IADL ability.    Baseline  12/16/2018: Unable to hold for more than 3 seconds, but able to remain standing.    Time  4    Period  Weeks    Status  Not Met    Target Date  01/13/19      PT LONG TERM GOAL #7   Title  patient will increase Mini Best test score to >18/28 to exhibit improved balance and reduce fall risk.    Time  4    Period  Weeks    Status  Partially Met    Target Date  01/13/19            Plan - 12/30/18 1254    Clinical Impression Statement  Patient demonstrates excellent motivation throughout today's session. Patient was able to perform progressed weight shift interventions with a focus on ankle strategies, required repetition and min VC for improved form. Patient continues to be challenged by activities involving SLS, which improved with repetition, demonstration, min TC and VC for sequencing and posture. She also demonstrated improvements this date with corrective stepping compared to last session, able to utilize 1-2 larger steps after anterior displacement of balance. Patient would continue to benefit from skilled PT to address the deficits outlined in this note.    Rehab Potential  Good    PT Frequency  2x / week    PT Duration  4 weeks    PT Treatment/Interventions  Cryotherapy;Electrical Stimulation;Moist Heat;Gait training;Stair training;Functional mobility training;Therapeutic activities;Therapeutic exercise;Balance training;Neuromuscular re-education;Patient/family education;Energy conservation    PT Next Visit Plan  Continue/progress ankle strategies and stepping correction, implement ADL training    Consulted and Agree with Plan of Care  Patient       Patient will benefit from skilled therapeutic intervention in order to improve the following deficits and impairments:  Abnormal gait, Decreased balance, Decreased endurance,  Decreased mobility, Difficulty walking, Impaired perceived functional ability, Decreased activity tolerance, Decreased safety awareness  Visit Diagnosis: Muscle weakness (generalized)  Unsteadiness on feet  Abnormal gait  Difficulty in walking, not elsewhere classified     Problem List Patient Active Problem List   Diagnosis Date Noted  . NPH (normal pressure hydrocephalus) ([place]) 07/30/2016  . Left leg weakness 04/20/2016  . Menopausal osteoporosis 03/04/2016  . Abnormal gait 12/18/2015  . Left foot drop 12/18/2015  . BP (high blood pressure) 08/24/2015  . Hydronephrosis, left 07/28/2015  . UPJ (ureteropelvic junction) obstruction 07/28/2015  . Lower urinary tract infectious disease 07/14/2015  . Hydronephrosis 07/14/2015   Jeneen Rinks A. Rosana Hoes, SPT This entire session was performed under direct supervision and direction of a licensed therapist/therapist assistant . I have personally read, edited and approve of the note as written.  Trotter,Margaret PT, DPT 12/30/2018, 1:07 PM  [place] MAIN [facility] SERVICES [address] [place], [place], [address] Phone: [phone]   Fax:  [phone]  Name: ALEXIANNA NACHREINER MRN: [id number] Date of Birth: 10-Mar-1950

## 2019-01-03 ENCOUNTER — Ambulatory Visit: Payer: PPO | Attending: Neurology | Admitting: Physical Therapy

## 2019-01-03 ENCOUNTER — Other Ambulatory Visit: Payer: Self-pay

## 2019-01-03 ENCOUNTER — Encounter: Payer: Self-pay | Admitting: Physical Therapy

## 2019-01-03 DIAGNOSIS — R2681 Unsteadiness on feet: Secondary | ICD-10-CM | POA: Diagnosis not present

## 2019-01-03 DIAGNOSIS — R262 Difficulty in walking, not elsewhere classified: Secondary | ICD-10-CM

## 2019-01-03 DIAGNOSIS — M6281 Muscle weakness (generalized): Secondary | ICD-10-CM | POA: Diagnosis not present

## 2019-01-03 NOTE — Patient Instructions (Signed)
Single leg stance exercise: Start with marching with one leg, holding on with finger tip hold each hand slowly x10 repetitions Then try holding march for 3 sec each lift x5-10 reps with 1 finger tip hold Then progress to holding march for 3 sec, without holding on to challenge single leg stance;  Repeat on each side;

## 2019-01-03 NOTE — Therapy (Signed)
[place] MAIN [facility] SERVICES [address] [place], [place], [address] Phone: [phone]   Fax:  [phone]  Physical Therapy Treatment  Patient Details  Name: Abigail Hill MRN: [id number] Date of Birth: [birth year] Referring Provider (PT): Dr. Metta Clines   Encounter Date: 01/03/2019  PT End of Session - 01/03/19 1247    Visit Number  44    Number of Visits  54    Date for PT Re-Evaluation  01/13/19    Authorization Type  goals addressed 12/16/2018    PT Start Time  1147    PT Stop Time  1226    PT Time Calculation (min)  39 min    Equipment Utilized During Treatment  Gait belt    Activity Tolerance  Patient limited by fatigue;Patient tolerated treatment well    Behavior During Therapy  [facility] for tasks assessed/performed       Past Medical History:  Diagnosis Date  . Abnormal gait 12/18/2015  . BP (high blood pressure) 08/24/2015  . Cancer ([place])    skin  . Constriction of ureter (postoperative) 07/28/2015  . Hydronephrosis 07/14/2015  . Hypertension   . Left foot drop 12/18/2015  . Recurrent UTI   . UPJ (ureteropelvic junction) obstruction 07/28/2015  . UTI (lower urinary tract infection) 07/14/2015    Past Surgical History:  Procedure Laterality Date  . broken foot  2012  . COLONOSCOPY WITH PROPOFOL N/A 06/30/2017   Procedure: COLONOSCOPY WITH PROPOFOL;  Surgeon: Lollie Sails, MD;  Location: [facility] ENDOSCOPY;  Service: Endoscopy;  Laterality: N/A;  . kidney repair  1985   repaired torn blood vessel in kidney    There were no vitals filed for this visit.  Subjective Assessment - 01/03/19 1152    Subjective  Pt reports doing well today; She denies any new falls; Pt reports working hard on new exercises, she is still having trouble with standing on one leg;    Pertinent History  68 yo Female reports instability with gait with difficulty walking. She states, "my left leg gives me trouble. It will  just plant and then I can't pick  it up." Pt reports 2 falls in last month. She reports when she falls it is often falling backwards; She was diagnosed with hydrocephalus and did a drain trial with no relief; She did not qualify for a shunt. Repeat imaging showed continued hydrocephalus that has not worsened; MRI of lumbar spine shows L5 pars defect with sacral insufficiency; She denies any back pain and reports no numbness in legs. She reports feeling frustration because they can't seem to find what's wrong with her. She does have urinary incontience; reports knowing when she has to go to the bathroom but can't get there fast enough; Patient has a walker and rollator; She uses both; She also has a manual wheelchair that she uses intermittently;     Limitations  Standing;Walking    How long can you sit comfortably?  NA    How long can you stand comfortably?  has pain in LLE with prolonged stooping; <10 min;     How long can you walk comfortably?  200 feet with assistance requiring several breaks;     Diagnostic tests  has had MRI and brain imaging;     Patient Stated Goals  "Be able to travel more, little sight seeing; walk short distances" Patient reports that she would like to be able to get rid of the walker.     Currently  in Pain?  No/denies    Pain Onset  Today    Multiple Pain Sites  No               Neuro Re-ed: Patient requires CGA during all standing interventions due to limited stability and patient fear of LOB. Cueing for reduction of UE support required as well as postural alignment for optimal stability within COM  TREATMENT:  NMRE:  -Heel-toe raises x15 unsupported; increased DF and PF AROM with repetition  Standing on 1/2 bolster flat side up: -heel/toe rock with rail assist x15 reps; -feet apart, BUE wand flexion x10 reps; Requires CGA for safety with cues for weight shift for better stance control;   Flat side down: Incline stand unsupported 30 sec hold x2 reps with CGA for safety;   Foam feet  together eyes open/closed 15-30 sec hold x3 reps each with CGA to close supervision; Firm surface feet together eyes closed 30 sec, no sway, secure;  Standing on firm surface: -standing slow march x10 reps with finger tip hold; Progressing to march holding 3 sec with 1 finger tip hold Progressing to march 3 sec hold without HHA x5 reps each LE; Requires CGA especially without fingertip hold with tactile cues for weight shift;  Standing outside parallel bars: Forward anterior lean into therapist hand, with random removal to challenge corrective stepping strategies x2 reps with good step with RLE;  Backward posterior lean into therapist hand with random removal to challenge corrective stepping x1 rep with good step with RLE without loss of balance;  Overall patient exhibiting improved weight shift and better stepping strategies compared to previous sessions; She was also able to exhibit improved stance control with eyes closed for longer period of time;   Pt educated throughout session about proper posture and technique with exercises. Improved exercise technique, movement at target joints, use of target muscles after min to mod verbal, visual, tactile cues.  Patient demonstrates excellent motivation throughout today's session. Patient was able to perform progressed weight shift interventions with a focus on ankle strategies, required repetition and min VC for improved form. Patient continues to be challenged by activities involving SLS, which improved with repetition, demonstration, min TC and VC for sequencing and posture. She also demonstrated improvements this date with corrective stepping compared to last session, able to utilize 1-2 larger steps after anterior displacement of balance. Patient would continue to benefit from skilled PT to address the deficits outlined in this note.                    PT Education - 01/03/19 1247    Education Details  balance/HEP     Person(s) Educated  Patient    Methods  Explanation;Verbal cues    Comprehension  Verbalized understanding;Returned demonstration;Verbal cues required;Need further instruction       PT Short Term Goals - 12/22/18 0816      PT SHORT TERM GOAL #1   Title  Patient will be adherent to HEP at least 3x a week to improve functional strength and balance for better safety at home.    Baseline  08/12/18- doing exercise daily;    Time  4    Period  Weeks    Status  Achieved    Target Date  08/17/18      PT SHORT TERM GOAL #2   Title   Patient will deny any falls over past 4 weeks to demonstrate improved safety awareness at home and work.  Baseline  08/12/18- fell 1 week ago; 09/23/18: none recent;    Time  4    Period  Weeks    Status  Achieved    Target Date  08/17/18        PT Long Term Goals - 12/22/18 0816      PT LONG TERM GOAL #1   Title  Patient will increase Berg Balance score by > 6 points to demonstrate decreased fall risk during functional activities.    Time  8    Period  Weeks    Status  Achieved    Target Date  01/13/19      PT LONG TERM GOAL #2   Title  Patient will increase 10 meter walk test to >0.8 m/s as to improve gait speed for better community ambulation and to reduce fall risk.    Baseline  12/16/2018: 0.47 m/s;    Time  8    Period  Weeks    Status  Deferred    Target Date  01/13/19      PT LONG TERM GOAL #3   Title  Patient will be require no assist with ascend/descend 3 steps using Least restrictive assistive device to improve safety with entry/exit home.     Baseline  12/16/2018: ascends and descends stairs with RUE rail assist.    Time  4    Period  Weeks    Status  Partially Met    Target Date  01/13/19      PT LONG TERM GOAL #4   Title  Patient will increase BLE gross strength to 4+/5 as to improve functional strength for independent gait, increased standing tolerance and increased ADL ability.    Baseline  12/16/2018: BLE hip ext 3/5, BLE hip  add 4-/5, BLE hip abd 4/5;    Time  4    Period  Weeks    Status  Partially Met    Target Date  01/13/19      PT LONG TERM GOAL #5   Title  Patient will increase Berg Balance score to >52/56 to demonstrate decreased fall risk during functional activities.    Baseline  12/16/2018: 48/56;    Time  4    Period  Weeks    Status  Partially Met    Target Date  01/13/19      PT LONG TERM GOAL #6   Title  Patient will be able to stand on one leg for >5 sec to indicate a decreased fall risk and increase ADL/IADL ability.    Baseline  12/16/2018: Unable to hold for more than 3 seconds, but able to remain standing.    Time  4    Period  Weeks    Status  Not Met    Target Date  01/13/19      PT LONG TERM GOAL #7   Title  patient will increase Mini Best test score to >18/28 to exhibit improved balance and reduce fall risk.    Time  4    Period  Weeks    Status  Partially Met    Target Date  01/13/19            Plan - 01/03/19 1247    Clinical Impression Statement  Patient motivated and participated well within session. She is progressing well with balance with improved static standing ability with eyes open/closed on compliant surfaces. She was also able to take better corrective step with trunk lean this session. Patient educated in ways  to improve SLS ability with marching. Patient does require cues for weight shift for better stance control. Patient would benefit from additional skilled PT intervention to improve strength, balance and gait safety;    Rehab Potential  Good    PT Frequency  2x / week    PT Duration  4 weeks    PT Treatment/Interventions  Cryotherapy;Electrical Stimulation;Moist Heat;Gait training;Stair training;Functional mobility training;Therapeutic activities;Therapeutic exercise;Balance training;Neuromuscular re-education;Patient/family education;Energy conservation    PT Next Visit Plan  Continue/progress ankle strategies and stepping correction, implement ADL  training    Consulted and Agree with Plan of Care  Patient       Patient will benefit from skilled therapeutic intervention in order to improve the following deficits and impairments:  Abnormal gait, Decreased balance, Decreased endurance, Decreased mobility, Difficulty walking, Impaired perceived functional ability, Decreased activity tolerance, Decreased safety awareness  Visit Diagnosis: Muscle weakness (generalized)  Unsteadiness on feet  Difficulty in walking, not elsewhere classified     Problem List Patient Active Problem List   Diagnosis Date Noted  . NPH (normal pressure hydrocephalus) ([place]) 07/30/2016  . Left leg weakness 04/20/2016  . Menopausal osteoporosis 03/04/2016  . Abnormal gait 12/18/2015  . Left foot drop 12/18/2015  . BP (high blood pressure) 08/24/2015  . Hydronephrosis, left 07/28/2015  . UPJ (ureteropelvic junction) obstruction 07/28/2015  . Lower urinary tract infectious disease 07/14/2015  . Hydronephrosis 07/14/2015    Alexes Menchaca PT, DPT 01/03/2019, 12:50 PM  [place] MAIN [facility] SERVICES [address] [place], [place], [address] Phone: [phone]   Fax:  [phone]  Name: AMEILIA RATTAN MRN: [id number] Date of Birth: 11-Feb-1951

## 2019-01-05 ENCOUNTER — Other Ambulatory Visit: Payer: Self-pay

## 2019-01-05 ENCOUNTER — Ambulatory Visit: Payer: PPO | Admitting: Physical Therapy

## 2019-01-05 ENCOUNTER — Encounter: Payer: Self-pay | Admitting: Physical Therapy

## 2019-01-05 DIAGNOSIS — R2681 Unsteadiness on feet: Secondary | ICD-10-CM

## 2019-01-05 DIAGNOSIS — M6281 Muscle weakness (generalized): Secondary | ICD-10-CM

## 2019-01-05 DIAGNOSIS — R262 Difficulty in walking, not elsewhere classified: Secondary | ICD-10-CM

## 2019-01-05 NOTE — Therapy (Signed)
[place] MAIN [facility] SERVICES [address] [place], [place], [address] Phone: [phone]   Fax:  [phone]  Physical Therapy Treatment  Patient Details  Name: Abigail Hill MRN: [id number] Date of Birth: 1950-04-24 Referring Provider (PT): Dr. Metta Clines   Encounter Date: 01/05/2019  PT End of Session - 01/05/19 1148    Visit Number  45    Number of Visits  54    Date for PT Re-Evaluation  01/13/19    Authorization Type  goals addressed 12/16/2018    PT Start Time  1145    PT Stop Time  1228    PT Time Calculation (min)  43 min    Equipment Utilized During Treatment  Gait belt    Activity Tolerance  Patient limited by fatigue;Patient tolerated treatment well    Behavior During Therapy  [facility] for tasks assessed/performed       Past Medical History:  Diagnosis Date  . Abnormal gait 12/18/2015  . BP (high blood pressure) 08/24/2015  . Cancer ([place])    skin  . Constriction of ureter (postoperative) 07/28/2015  . Hydronephrosis 07/14/2015  . Hypertension   . Left foot drop 12/18/2015  . Recurrent UTI   . UPJ (ureteropelvic junction) obstruction 07/28/2015  . UTI (lower urinary tract infection) 07/14/2015    Past Surgical History:  Procedure Laterality Date  . broken foot  2012  . COLONOSCOPY WITH PROPOFOL N/A 06/30/2017   Procedure: COLONOSCOPY WITH PROPOFOL;  Surgeon: Lollie Sails, MD;  Location: [facility] ENDOSCOPY;  Service: Endoscopy;  Laterality: N/A;  . kidney repair  1985   repaired torn blood vessel in kidney    There were no vitals filed for this visit.  Subjective Assessment - 01/05/19 1146    Subjective  Patient reports doing well today; She reports some fatigue after staying up late watching the election results; She reports working on [employer], operational and states that after a while she stopped and gave up; Patient reports some soreness in thigh so she took some ibuprofen and went to bed;    Pertinent History  68 yo  Female reports instability with gait with difficulty walking. She states, "my left leg gives me trouble. It will  just plant and then I can't pick it up." Pt reports 2 falls in last month. She reports when she falls it is often falling backwards; She was diagnosed with hydrocephalus and did a drain trial with no relief; She did not qualify for a shunt. Repeat imaging showed continued hydrocephalus that has not worsened; MRI of lumbar spine shows L5 pars defect with sacral insufficiency; She denies any back pain and reports no numbness in legs. She reports feeling frustration because they can't seem to find what's wrong with her. She does have urinary incontience; reports knowing when she has to go to the bathroom but can't get there fast enough; Patient has a walker and rollator; She uses both; She also has a manual wheelchair that she uses intermittently;     Limitations  Standing;Walking    How long can you sit comfortably?  NA    How long can you stand comfortably?  has pain in LLE with prolonged stooping; <10 min;     How long can you walk comfortably?  200 feet with assistance requiring several breaks;     Diagnostic tests  has had MRI and brain imaging;     Patient Stated Goals  "Be able to travel more, little sight seeing; walk  short distances" Patient reports that she would like to be able to get rid of the walker.     Currently in Pain?  No/denies    Pain Onset  Today    Multiple Pain Sites  No                 Neuro Re-ed: Patient requires CGA during all standing interventions due to limited stability and patient fear of LOB. Cueing for reduction of UE support required as well as postural alignment for optimal stability within COM  TREATMENT:  NMRE:  -Heel-toe raisesx10 unsupported; increased DF and PF AROM with repetition Progressed to heel raises holding for up to 3 sec unsupported x5 reps x2 sets with min A for safety;  Standing on 1/2 bolster flat side up: -heel/toe  rock with rail assist x10 reps; -feet apart, BUE wand flexion x10 reps; Requires CGA for safety with cues for weight shift for better stance control;  -tandem stance with 2-0 rail assist 10 sec hold x2 reps each foot in front;    Standing on firm surface: March holding 3 sec with 1 finger tip hold Progressing to march 3 sec hold without HHA x5 reps each LE; Requires CGA especially without fingertip hold with tactile cues for weight shift;  Resisted walking, forward/backward 12.5# x3 laps with Min A for safety; Pt able to exhibit improved reciprocal steps with less unsteadiness and more narrow base of support;  Standing on rockerboard: Feet apart forward/backward teeter with 2-1 rail assist x2 min with CGA for safety and cues to keep erect posture to challenge ankle strategies; Feet staggered forward/backward teeter with 2-1 rail assist x1 min each foot in front; Patient exhibits decreased gluteal activation and poor hip control with forward weight shift to RLE, requiring min A for safety and cues for better positioning; She exhibits improved control and less instability with LLE ahead of RLE;    Overall patient exhibiting improved weight shift and better stepping strategies compared to previous sessions;   Pt educated throughout session about proper posture and technique with exercises. Improved exercise technique, movement at target joints, use of target muscles after min to mod verbal, visual, tactile cues.  Patient demonstratesexcellentmotivation throughout today's session. Patient was able to perform progressed weight shift interventions with a focus on ankle strategies, requiredrepetition and min VC for improved form. Patient continues to be challenged byactivities involving SLS, which improved withrepetition, demonstration, min TC and VC for sequencing and posture. Patient would continue to benefit from skilled PT to address the deficits outlined in this  note.               PT Education - 01/05/19 1148    Education Details  balance/HEP/strengthening;    Person(s) Educated  Patient    Methods  Explanation;Verbal cues    Comprehension  Verbalized understanding;Returned demonstration;Verbal cues required;Need further instruction       PT Short Term Goals - 12/22/18 0816      PT SHORT TERM GOAL #1   Title  Patient will be adherent to HEP at least 3x a week to improve functional strength and balance for better safety at home.    Baseline  08/12/18- doing exercise daily;    Time  4    Period  Weeks    Status  Achieved    Target Date  08/17/18      PT SHORT TERM GOAL #2   Title   Patient will deny any falls over past 4 weeks to  demonstrate improved safety awareness at home and work.     Baseline  08/12/18- fell 1 week ago; 09/23/18: none recent;    Time  4    Period  Weeks    Status  Achieved    Target Date  08/17/18        PT Long Term Goals - 12/22/18 0816      PT LONG TERM GOAL #1   Title  Patient will increase Berg Balance score by > 6 points to demonstrate decreased fall risk during functional activities.    Time  8    Period  Weeks    Status  Achieved    Target Date  01/13/19      PT LONG TERM GOAL #2   Title  Patient will increase 10 meter walk test to >0.8 m/s as to improve gait speed for better community ambulation and to reduce fall risk.    Baseline  12/16/2018: 0.47 m/s;    Time  8    Period  Weeks    Status  Deferred    Target Date  01/13/19      PT LONG TERM GOAL #3   Title  Patient will be require no assist with ascend/descend 3 steps using Least restrictive assistive device to improve safety with entry/exit home.     Baseline  12/16/2018: ascends and descends stairs with RUE rail assist.    Time  4    Period  Weeks    Status  Partially Met    Target Date  01/13/19      PT LONG TERM GOAL #4   Title  Patient will increase BLE gross strength to 4+/5 as to improve functional strength for  independent gait, increased standing tolerance and increased ADL ability.    Baseline  12/16/2018: BLE hip ext 3/5, BLE hip add 4-/5, BLE hip abd 4/5;    Time  4    Period  Weeks    Status  Partially Met    Target Date  01/13/19      PT LONG TERM GOAL #5   Title  Patient will increase Berg Balance score to >52/56 to demonstrate decreased fall risk during functional activities.    Baseline  12/16/2018: 48/56;    Time  4    Period  Weeks    Status  Partially Met    Target Date  01/13/19      PT LONG TERM GOAL #6   Title  Patient will be able to stand on one leg for >5 sec to indicate a decreased fall risk and increase ADL/IADL ability.    Baseline  12/16/2018: Unable to hold for more than 3 seconds, but able to remain standing.    Time  4    Period  Weeks    Status  Not Met    Target Date  01/13/19      PT LONG TERM GOAL #7   Title  patient will increase Mini Best test score to >18/28 to exhibit improved balance and reduce fall risk.    Time  4    Period  Weeks    Status  Partially Met    Target Date  01/13/19            Plan - 01/05/19 1410    Clinical Impression Statement  Patient motivated and participated well within session. Instructed patient in advanced balance tasks reducing HHA and challenging weight shift on firm and compliant surfaces. Patient is progressing exhibiting improved SLS  ability and better heel/toe raises unsupported. She continues to require cues for proper weight shift and positioning for optimal stance control. She had increased difficulty with rockerboard especially with staggered stance often not wanting to shift forward to RLE. Patient would benefit from additional skilled PT intervention to improve strength, balance and gait safety;    Rehab Potential  Good    PT Frequency  2x / week    PT Duration  4 weeks    PT Treatment/Interventions  Cryotherapy;Electrical Stimulation;Moist Heat;Gait training;Stair training;Functional mobility  training;Therapeutic activities;Therapeutic exercise;Balance training;Neuromuscular re-education;Patient/family education;Energy conservation    PT Next Visit Plan  Continue/progress ankle strategies and stepping correction, implement ADL training    Consulted and Agree with Plan of Care  Patient       Patient will benefit from skilled therapeutic intervention in order to improve the following deficits and impairments:  Abnormal gait, Decreased balance, Decreased endurance, Decreased mobility, Difficulty walking, Impaired perceived functional ability, Decreased activity tolerance, Decreased safety awareness  Visit Diagnosis: Muscle weakness (generalized)  Unsteadiness on feet  Difficulty in walking, not elsewhere classified     Problem List Patient Active Problem List   Diagnosis Date Noted  . NPH (normal pressure hydrocephalus) ([place]) 07/30/2016  . Left leg weakness 04/20/2016  . Menopausal osteoporosis 03/04/2016  . Abnormal gait 12/18/2015  . Left foot drop 12/18/2015  . BP (high blood pressure) 08/24/2015  . Hydronephrosis, left 07/28/2015  . UPJ (ureteropelvic junction) obstruction 07/28/2015  . Lower urinary tract infectious disease 07/14/2015  . Hydronephrosis 07/14/2015    , PT, DPT 01/05/2019, 2:12 PM  [place] MAIN [facility] SERVICES [address] [place], [place], [address] Phone: [phone]   Fax:  [phone]  Name: Abigail Hill MRN: [id number] Date of Birth: March 17, 1950

## 2019-01-11 ENCOUNTER — Ambulatory Visit: Payer: PPO | Admitting: Physical Therapy

## 2019-01-11 ENCOUNTER — Encounter: Payer: Self-pay | Admitting: Physical Therapy

## 2019-01-11 ENCOUNTER — Other Ambulatory Visit: Payer: Self-pay

## 2019-01-11 DIAGNOSIS — R262 Difficulty in walking, not elsewhere classified: Secondary | ICD-10-CM

## 2019-01-11 DIAGNOSIS — M6281 Muscle weakness (generalized): Secondary | ICD-10-CM

## 2019-01-11 DIAGNOSIS — R2681 Unsteadiness on feet: Secondary | ICD-10-CM

## 2019-01-11 NOTE — Therapy (Signed)
[place] MAIN [facility] SERVICES [address] [place], [place], [address] Phone: [phone]   Fax:  [phone]  Physical Therapy Treatment  Patient Details  Name: Abigail Hill MRN: [id number] Date of Birth: Nov 14, 1950 Referring Provider (PT): Dr. Metta Clines   Encounter Date: 01/11/2019  PT End of Session - 01/11/19 1144    Visit Number  46    Number of Visits  54    Date for PT Re-Evaluation  01/13/19    Authorization Type  goals addressed 12/16/2018    PT Start Time  1143    PT Stop Time  1215    PT Time Calculation (min)  32 min    Equipment Utilized During Treatment  Gait belt    Activity Tolerance  Patient limited by fatigue;Patient tolerated treatment well    Behavior During Therapy  [facility] for tasks assessed/performed       Past Medical History:  Diagnosis Date  . Abnormal gait 12/18/2015  . BP (high blood pressure) 08/24/2015  . Cancer ([place])    skin  . Constriction of ureter (postoperative) 07/28/2015  . Hydronephrosis 07/14/2015  . Hypertension   . Left foot drop 12/18/2015  . Recurrent UTI   . UPJ (ureteropelvic junction) obstruction 07/28/2015  . UTI (lower urinary tract infection) 07/14/2015    Past Surgical History:  Procedure Laterality Date  . broken foot  2012  . COLONOSCOPY WITH PROPOFOL N/A 06/30/2017   Procedure: COLONOSCOPY WITH PROPOFOL;  Surgeon: Lollie Sails, MD;  Location: [facility] ENDOSCOPY;  Service: Endoscopy;  Laterality: N/A;  . kidney repair  1985   repaired torn blood vessel in kidney    There were no vitals filed for this visit.  Subjective Assessment - 01/11/19 1143    Subjective  Patient reports doing well. She reports having a busy weekend and is a little tired. Denies any pain; Denies any new falls;    Pertinent History  68 yo Female reports instability with gait with difficulty walking. She states, "my left leg gives me trouble. It will  just plant and then I can't pick it up." Pt reports 2  falls in last month. She reports when she falls it is often falling backwards; She was diagnosed with hydrocephalus and did a drain trial with no relief; She did not qualify for a shunt. Repeat imaging showed continued hydrocephalus that has not worsened; MRI of lumbar spine shows L5 pars defect with sacral insufficiency; She denies any back pain and reports no numbness in legs. She reports feeling frustration because they can't seem to find what's wrong with her. She does have urinary incontience; reports knowing when she has to go to the bathroom but can't get there fast enough; Patient has a walker and rollator; She uses both; She also has a manual wheelchair that she uses intermittently;     Limitations  Standing;Walking    How long can you sit comfortably?  NA    How long can you stand comfortably?  has pain in LLE with prolonged stooping; <10 min;     How long can you walk comfortably?  200 feet with assistance requiring several breaks;     Diagnostic tests  has had MRI and brain imaging;     Patient Stated Goals  "Be able to travel more, little sight seeing; walk short distances" Patient reports that she would like to be able to get rid of the walker.     Currently in Pain?  No/denies  Pain Onset  Today    Multiple Pain Sites  No           Neuro Re-ed: Patient requires CGA during all standing interventions due to limited stability and patient fear of LOB. Cueing for reduction of UE support required as well as postural alignment for optimal stability within COM  TREATMENT:  NMRE:  -Heel-toe raisesx10unsupported; increased DF and PF AROM with repetition Progressed to heel raises holding for up to 3 sec unsupported x5 reps x2 sets with min A for safety;  Standing on firm surface outside parallel bars: Weight shift corrective stepping strategies x5 reps forward, backward and side/side with mod VCs for proper posture and weight shift. Patient requires increased time to achieve  good weight shift before therapist removes resistance to challenge corrective stepping. She was able to take 1 step forward/side/back with each repetition exhibiting improved corrective stepping strategies for better static/dynamic  standing balance;   Standing on rockerboard: Feet apart forward/backward teeter with 2-1 rail assist x2 min with CGA for safety and cues to keep erect posture to challenge ankle strategies; Feet staggered forward/backward teeter with 2-1 rail assist x1 min each foot in front; Patient exhibits decreased gluteal activation and poor hip control with forward weight shift to RLE, requiring min A for safety and cues for better positioning; She exhibits improved control and less instability with LLE ahead of RLE;    Overall patient exhibiting improved weight shift and better stepping strategies compared to previous sessions;   Pt educated throughout session about proper posture and technique with exercises. Improved exercise technique, movement at target joints, use of target muscles after min to mod verbal, visual, tactile cues.  Patient demonstratesexcellentmotivation throughout today's session. Patient was able to perform progressed weight shift interventions with a focus on ankle strategies, requiredrepetition and min VC for improved form.  Patient would continue to benefit from skilled PT to address the deficits outlined in this note.                       PT Education - 01/11/19 1144    Education Details  Balance/HEP/strengthening;    Person(s) Educated  Patient    Methods  Explanation;Verbal cues    Comprehension  Verbalized understanding;Returned demonstration;Verbal cues required;Need further instruction       PT Short Term Goals - 12/22/18 0816      PT SHORT TERM GOAL #1   Title  Patient will be adherent to HEP at least 3x a week to improve functional strength and balance for better safety at home.    Baseline  08/12/18- doing  exercise daily;    Time  4    Period  Weeks    Status  Achieved    Target Date  08/17/18      PT SHORT TERM GOAL #2   Title   Patient will deny any falls over past 4 weeks to demonstrate improved safety awareness at home and work.     Baseline  08/12/18- fell 1 week ago; 09/23/18: none recent;    Time  4    Period  Weeks    Status  Achieved    Target Date  08/17/18        PT Long Term Goals - 12/22/18 0816      PT LONG TERM GOAL #1   Title  Patient will increase Berg Balance score by > 6 points to demonstrate decreased fall risk during functional activities.    Time  8    Period  Weeks    Status  Achieved    Target Date  01/13/19      PT LONG TERM GOAL #2   Title  Patient will increase 10 meter walk test to >0.8 m/s as to improve gait speed for better community ambulation and to reduce fall risk.    Baseline  12/16/2018: 0.47 m/s;    Time  8    Period  Weeks    Status  Deferred    Target Date  01/13/19      PT LONG TERM GOAL #3   Title  Patient will be require no assist with ascend/descend 3 steps using Least restrictive assistive device to improve safety with entry/exit home.     Baseline  12/16/2018: ascends and descends stairs with RUE rail assist.    Time  4    Period  Weeks    Status  Partially Met    Target Date  01/13/19      PT LONG TERM GOAL #4   Title  Patient will increase BLE gross strength to 4+/5 as to improve functional strength for independent gait, increased standing tolerance and increased ADL ability.    Baseline  12/16/2018: BLE hip ext 3/5, BLE hip add 4-/5, BLE hip abd 4/5;    Time  4    Period  Weeks    Status  Partially Met    Target Date  01/13/19      PT LONG TERM GOAL #5   Title  Patient will increase Berg Balance score to >52/56 to demonstrate decreased fall risk during functional activities.    Baseline  12/16/2018: 48/56;    Time  4    Period  Weeks    Status  Partially Met    Target Date  01/13/19      PT LONG TERM GOAL #6    Title  Patient will be able to stand on one leg for >5 sec to indicate a decreased fall risk and increase ADL/IADL ability.    Baseline  12/16/2018: Unable to hold for more than 3 seconds, but able to remain standing.    Time  4    Period  Weeks    Status  Not Met    Target Date  01/13/19      PT LONG TERM GOAL #7   Title  patient will increase Mini Best test score to >18/28 to exhibit improved balance and reduce fall risk.    Time  4    Period  Weeks    Status  Partially Met    Target Date  01/13/19            Plan - 01/11/19 1222    Clinical Impression Statement  Patient motivated and participated well within session.She did come late to session due to time confusion. Patient instructed in advanced balance exercise. She is progressing well being able to exhibit better step response to corrective strategies. Patient does require increased time and cues for proper stance and weight shift for better stance control. Patient able to exhibit reciprocal gait pattern for short distance at end of treatment session but then exhibits increased foot drag and shorter step length on LLE with prolonged ambulation. She would benefit from additional skilled PT intervention to improve strength, balance and gait safety; Plan to assess goals next visit.    Rehab Potential  Good    PT Frequency  2x / week    PT Duration  4 weeks  PT Treatment/Interventions  Cryotherapy;Electrical Stimulation;Moist Heat;Gait training;Stair training;Functional mobility training;Therapeutic activities;Therapeutic exercise;Balance training;Neuromuscular re-education;Patient/family education;Energy conservation    PT Next Visit Plan  Continue/progress ankle strategies and stepping correction, implement ADL training    Consulted and Agree with Plan of Care  Patient       Patient will benefit from skilled therapeutic intervention in order to improve the following deficits and impairments:  Abnormal gait, Decreased balance,  Decreased endurance, Decreased mobility, Difficulty walking, Impaired perceived functional ability, Decreased activity tolerance, Decreased safety awareness  Visit Diagnosis: Muscle weakness (generalized)  Unsteadiness on feet  Difficulty in walking, not elsewhere classified     Problem List Patient Active Problem List   Diagnosis Date Noted  . NPH (normal pressure hydrocephalus) ([place]) 07/30/2016  . Left leg weakness 04/20/2016  . Menopausal osteoporosis 03/04/2016  . Abnormal gait 12/18/2015  . Left foot drop 12/18/2015  . BP (high blood pressure) 08/24/2015  . Hydronephrosis, left 07/28/2015  . UPJ (ureteropelvic junction) obstruction 07/28/2015  . Lower urinary tract infectious disease 07/14/2015  . Hydronephrosis 07/14/2015    Trotter,Margaret PT, DPT 01/11/2019, 12:41 PM  [place] MAIN [facility] SERVICES [address] [place], [place], [address] Phone: [phone]   Fax:  [phone]  Name: KENSLEI HEARTY MRN: [id number] Date of Birth: 1950/11/18

## 2019-01-12 DIAGNOSIS — X32XXXA Exposure to sunlight, initial encounter: Secondary | ICD-10-CM | POA: Diagnosis not present

## 2019-01-12 DIAGNOSIS — L570 Actinic keratosis: Secondary | ICD-10-CM | POA: Diagnosis not present

## 2019-01-12 DIAGNOSIS — D2261 Melanocytic nevi of right upper limb, including shoulder: Secondary | ICD-10-CM | POA: Diagnosis not present

## 2019-01-12 DIAGNOSIS — D485 Neoplasm of uncertain behavior of skin: Secondary | ICD-10-CM | POA: Diagnosis not present

## 2019-01-12 DIAGNOSIS — D2262 Melanocytic nevi of left upper limb, including shoulder: Secondary | ICD-10-CM | POA: Diagnosis not present

## 2019-01-12 DIAGNOSIS — L820 Inflamed seborrheic keratosis: Secondary | ICD-10-CM | POA: Diagnosis not present

## 2019-01-12 DIAGNOSIS — Z85828 Personal history of other malignant neoplasm of skin: Secondary | ICD-10-CM | POA: Diagnosis not present

## 2019-01-12 DIAGNOSIS — D2272 Melanocytic nevi of left lower limb, including hip: Secondary | ICD-10-CM | POA: Diagnosis not present

## 2019-01-12 DIAGNOSIS — L218 Other seborrheic dermatitis: Secondary | ICD-10-CM | POA: Diagnosis not present

## 2019-01-13 ENCOUNTER — Other Ambulatory Visit: Payer: Self-pay

## 2019-01-13 ENCOUNTER — Ambulatory Visit: Payer: PPO | Admitting: Physical Therapy

## 2019-01-13 ENCOUNTER — Encounter: Payer: Self-pay | Admitting: Physical Therapy

## 2019-01-13 DIAGNOSIS — M6281 Muscle weakness (generalized): Secondary | ICD-10-CM | POA: Diagnosis not present

## 2019-01-13 DIAGNOSIS — R2681 Unsteadiness on feet: Secondary | ICD-10-CM

## 2019-01-13 DIAGNOSIS — R262 Difficulty in walking, not elsewhere classified: Secondary | ICD-10-CM

## 2019-01-13 NOTE — Therapy (Signed)
[place] MAIN [facility] SERVICES [address] [place], [place], [address] Phone: [phone]   Fax:  [phone]  Physical Therapy Treatment  Patient Details  Name: Abigail Hill MRN: [id number] Date of Birth: 02/27/1951 Referring Provider (PT): Dr. Metta Clines   Encounter Date: 01/13/2019  PT End of Session - 01/13/19 1345    Visit Number  80    Number of Visits  73    Date for PT Re-Evaluation  02/10/19    Authorization Type  goals addressed 12/16/2018    PT Start Time  1147    PT Stop Time  1230    PT Time Calculation (min)  43 min    Equipment Utilized During Treatment  Gait belt    Activity Tolerance  Patient limited by fatigue;Patient tolerated treatment well    Behavior During Therapy  [facility] for tasks assessed/performed       Past Medical History:  Diagnosis Date  . Abnormal gait 12/18/2015  . BP (high blood pressure) 08/24/2015  . Cancer ([place])    skin  . Constriction of ureter (postoperative) 07/28/2015  . Hydronephrosis 07/14/2015  . Hypertension   . Left foot drop 12/18/2015  . Recurrent UTI   . UPJ (ureteropelvic junction) obstruction 07/28/2015  . UTI (lower urinary tract infection) 07/14/2015    Past Surgical History:  Procedure Laterality Date  . broken foot  2012  . COLONOSCOPY WITH PROPOFOL N/A 06/30/2017   Procedure: COLONOSCOPY WITH PROPOFOL;  Surgeon: Lollie Sails, MD;  Location: [facility] ENDOSCOPY;  Service: Endoscopy;  Laterality: N/A;  . kidney repair  1985   repaired torn blood vessel in kidney    There were no vitals filed for this visit.  Subjective Assessment - 01/13/19 1151    Subjective  Pt reports doing well; no new falls; no soreness; Reports adherence with HEP    Pertinent History  68 yo Female reports instability with gait with difficulty walking. She states, "my left leg gives me trouble. It will  just plant and then I can't pick it up." Pt reports 2 falls in last month. She reports when she falls it  is often falling backwards; She was diagnosed with hydrocephalus and did a drain trial with no relief; She did not qualify for a shunt. Repeat imaging showed continued hydrocephalus that has not worsened; MRI of lumbar spine shows L5 pars defect with sacral insufficiency; She denies any back pain and reports no numbness in legs. She reports feeling frustration because they can't seem to find what's wrong with her. She does have urinary incontience; reports knowing when she has to go to the bathroom but can't get there fast enough; Patient has a walker and rollator; She uses both; She also has a manual wheelchair that she uses intermittently;     Limitations  Standing;Walking    How long can you sit comfortably?  NA    How long can you stand comfortably?  has pain in LLE with prolonged stooping; <10 min;     How long can you walk comfortably?  200 feet with assistance requiring several breaks;     Diagnostic tests  has had MRI and brain imaging;     Patient Stated Goals  "Be able to travel more, little sight seeing; walk short distances" Patient reports that she would like to be able to get rid of the walker.     Currently in Pain?  No/denies    Pain Onset  Today  Multiple Pain Sites  No         [facility] PT Assessment - 01/13/19 0001      Standardized Balance Assessment   10 Meter Walk  0.67 m/s without AD, limited home ambulator and increased risk for falls, but improved from  0.47 m/s on 12/16/18      Mini-BESTest   Sit To Stand  Normal: Comes to stand without use of hands and stabilizes independently.    Rise to Toes  Moderate: Heels up, but not full range (smaller than when holding hands), OR noticeable instability for 3 s.    Stand on one leg (left)  Moderate: < 20 s    Stand on one leg (right)  Moderate: < 20 s    Stand on one leg - lowest score  1    Compensatory Stepping Correction - Forward  Normal: Recovers independently with a single, large step (second realignement is allowed).     Compensatory Stepping Correction - Backward  Moderate: More than one step is required to recover equilibrium    Compensatory Stepping Correction - Left Lateral  Normal: Recovers independently with 1 step (crossover or lateral OK)    Compensatory Stepping Correction - Right Lateral  Normal: Recovers independently with 1 step (crossover or lateral OK)    Stepping Corredtion Lateral - lowest score  2    Stance - Feet together, eyes open, firm surface   Normal: 30s    Stance - Feet together, eyes closed, foam surface   Moderate: < 30s    Incline - Eyes Closed  Normal: Stands independently 30s and aligns with gravity    Change in Gait Speed  Moderate: Unable to change walking speed or signs of imbalance    Walk with head turns - Horizontal  Moderate: performs head turns with reduction in gait speed.    Walk with pivot turns  Moderate:Turns with feet close SLOW (>4 steps) with good balance.    Step over obstacles  Moderate: Steps over box but touches box OR displays cautious behavior by slowing gait.    Timed UP & GO with Dual Task  Moderate: Dual Task affects either counting OR walking (>10%) when compared to the TUG without Dual Task.    Mini-BEST total score  19          TREATMENT: Warm up on Nustep BUE/BLE level 2 x4 min (unbilled);  Patient instructed in mini best test, 10 meter walk, etc to address goals. See above;  Patient did require increased repetition/trials during mini best test to reorient to midline. She exhibited increased left posterior lateral trunk lean today which is likely due to wet weather/fatigue.   Patient exhibits significant improvement in balance and gait ability this session. Reinforced HEP, she verbalized understanding.                  PT Education - 01/13/19 1345    Education Details  balance/progress towards goals, HEP    Person(s) Educated  Patient    Methods  Explanation;Verbal cues    Comprehension  Verbalized understanding;Returned  demonstration;Verbal cues required;Need further instruction       PT Short Term Goals - 01/13/19 1153      PT SHORT TERM GOAL #1   Title  Patient will be adherent to HEP at least 3x a week to improve functional strength and balance for better safety at home.    Baseline  08/12/18- doing exercise daily;    Time  4  Period  Weeks    Status  Achieved    Target Date  08/17/18      PT SHORT TERM GOAL #2   Title   Patient will deny any falls over past 4 weeks to demonstrate improved safety awareness at home and work.     Baseline  08/12/18- fell 1 week ago; 09/23/18: none recent;    Time  4    Period  Weeks    Status  Achieved    Target Date  08/17/18        PT Long Term Goals - 01/13/19 1153      PT LONG TERM GOAL #1   Title  Patient will increase Berg Balance score by > 6 points to demonstrate decreased fall risk during functional activities.    Time  8    Period  Weeks    Status  Achieved      PT LONG TERM GOAL #2   Title  Patient will increase 10 meter walk test to >0.8 m/s as to improve gait speed for better community ambulation and to reduce fall risk.    Baseline  12/16/2018: 0.47 m/s;    Time  8    Period  Weeks    Status  Partially Met    Target Date  02/10/19      PT LONG TERM GOAL #3   Title  Patient will be require no assist with ascend/descend 3 steps using Least restrictive assistive device to improve safety with entry/exit home.     Baseline  12/16/2018: ascends and descends stairs with RUE rail assist.    Time  4    Period  Weeks    Status  Partially Met    Target Date  02/10/19      PT LONG TERM GOAL #4   Title  Patient will increase BLE gross strength to 4+/5 as to improve functional strength for independent gait, increased standing tolerance and increased ADL ability.    Baseline  12/16/2018: BLE hip ext 3/5, BLE hip add 4-/5, BLE hip abd 4/5;    Time  4    Period  Weeks    Status  Partially Met    Target Date  02/10/19      PT LONG TERM GOAL #5    Title  Patient will increase Berg Balance score to >52/56 to demonstrate decreased fall risk during functional activities.    Baseline  12/16/2018: 48/56;    Time  4    Period  Weeks    Status  Partially Met    Target Date  02/10/19      PT LONG TERM GOAL #6   Title  Patient will be able to stand on one leg for >5 sec to indicate a decreased fall risk and increase ADL/IADL ability.    Baseline  12/16/2018: Unable to hold for more than 3 seconds, but able to remain standing.    Time  4    Period  Weeks    Status  Not Met    Target Date  02/10/19      PT LONG TERM GOAL #7   Title  patient will increase Mini Best test score to >18/28 to exhibit improved balance and reduce fall risk.    Time  4    Period  Weeks    Status  Achieved            Plan - 01/13/19 1345    Clinical Impression Statement  Patient motivated and  participated well within session. She does exhibit significant improvement in balance and gait ability this session. This is evidenced with improved mini-best test and 10 meter walk speed. She has been adherent with HEP and has been working on improving weight shifting. Patient continues to exhibit heavy lean to LLE with wide base of support with ambulation. She would benefit from skilled PT intervention to improve strength, balance and gait safety;    Rehab Potential  Good    PT Frequency  2x / week    PT Duration  4 weeks    PT Treatment/Interventions  Cryotherapy;Electrical Stimulation;Moist Heat;Gait training;Stair training;Functional mobility training;Therapeutic activities;Therapeutic exercise;Balance training;Neuromuscular re-education;Patient/family education;Energy conservation    PT Next Visit Plan  Continue/progress ankle strategies and stepping correction, implement ADL training    Consulted and Agree with Plan of Care  Patient       Patient will benefit from skilled therapeutic intervention in order to improve the following deficits and impairments:   Abnormal gait, Decreased balance, Decreased endurance, Decreased mobility, Difficulty walking, Impaired perceived functional ability, Decreased activity tolerance, Decreased safety awareness  Visit Diagnosis: Muscle weakness (generalized)  Unsteadiness on feet  Difficulty in walking, not elsewhere classified     Problem List Patient Active Problem List   Diagnosis Date Noted  . NPH (normal pressure hydrocephalus) ([place]) 07/30/2016  . Left leg weakness 04/20/2016  . Menopausal osteoporosis 03/04/2016  . Abnormal gait 12/18/2015  . Left foot drop 12/18/2015  . BP (high blood pressure) 08/24/2015  . Hydronephrosis, left 07/28/2015  . UPJ (ureteropelvic junction) obstruction 07/28/2015  . Lower urinary tract infectious disease 07/14/2015  . Hydronephrosis 07/14/2015    Anelly Samarin PT, DPT 01/13/2019, 3:28 PM  [place] MAIN [facility] SERVICES [address] [place], [place], [address] Phone: [phone]   Fax:  [phone]  Name: SHANETTE TAMARGO MRN: [id number] Date of Birth: Dec 09, 1950

## 2019-01-18 ENCOUNTER — Ambulatory Visit: Payer: PPO | Admitting: Physical Therapy

## 2019-01-18 ENCOUNTER — Encounter: Payer: Self-pay | Admitting: Physical Therapy

## 2019-01-18 ENCOUNTER — Other Ambulatory Visit: Payer: Self-pay

## 2019-01-18 DIAGNOSIS — M6281 Muscle weakness (generalized): Secondary | ICD-10-CM | POA: Diagnosis not present

## 2019-01-18 DIAGNOSIS — R262 Difficulty in walking, not elsewhere classified: Secondary | ICD-10-CM

## 2019-01-18 DIAGNOSIS — R2681 Unsteadiness on feet: Secondary | ICD-10-CM

## 2019-01-18 NOTE — Therapy (Signed)
[place] MAIN [facility] SERVICES [address] [place], [place], [address] Phone: [phone]   Fax:  [phone]  Physical Therapy Treatment  Patient Details  Name: Abigail Hill MRN: [id number] Date of Birth: Jul 14, 1950 Referring Provider (PT): Dr. Metta Clines   Encounter Date: 01/18/2019  PT End of Session - 01/18/19 1255    Visit Number  48    Number of Visits  21    Date for PT Re-Evaluation  02/10/19    Authorization Type  goals addressed 12/16/2018    PT Start Time  1146    PT Stop Time  1228    PT Time Calculation (min)  42 min    Equipment Utilized During Treatment  Gait belt    Activity Tolerance  Patient limited by fatigue;Patient tolerated treatment well    Behavior During Therapy  [facility] for tasks assessed/performed       Past Medical History:  Diagnosis Date  . Abnormal gait 12/18/2015  . BP (high blood pressure) 08/24/2015  . Cancer ([place])    skin  . Constriction of ureter (postoperative) 07/28/2015  . Hydronephrosis 07/14/2015  . Hypertension   . Left foot drop 12/18/2015  . Recurrent UTI   . UPJ (ureteropelvic junction) obstruction 07/28/2015  . UTI (lower urinary tract infection) 07/14/2015    Past Surgical History:  Procedure Laterality Date  . broken foot  2012  . COLONOSCOPY WITH PROPOFOL N/A 06/30/2017   Procedure: COLONOSCOPY WITH PROPOFOL;  Surgeon: Lollie Sails, MD;  Location: [facility] ENDOSCOPY;  Service: Endoscopy;  Laterality: N/A;  . kidney repair  1985   repaired torn blood vessel in kidney    There were no vitals filed for this visit.  Subjective Assessment - 01/18/19 1147    Subjective  Patient reports doing well; Denies any pain; Reports that her balance seemed off on Sunday but has been better since then. She denies any falls. She reports she was just walking slower and had to use her walker more.    Pertinent History  68 yo Female reports instability with gait with difficulty walking. She states, "my  left leg gives me trouble. It will  just plant and then I can't pick it up." Pt reports 2 falls in last month. She reports when she falls it is often falling backwards; She was diagnosed with hydrocephalus and did a drain trial with no relief; She did not qualify for a shunt. Repeat imaging showed continued hydrocephalus that has not worsened; MRI of lumbar spine shows L5 pars defect with sacral insufficiency; She denies any back pain and reports no numbness in legs. She reports feeling frustration because they can't seem to find what's wrong with her. She does have urinary incontience; reports knowing when she has to go to the bathroom but can't get there fast enough; Patient has a walker and rollator; She uses both; She also has a manual wheelchair that she uses intermittently;     Limitations  Standing;Walking    How long can you sit comfortably?  NA    How long can you stand comfortably?  has pain in LLE with prolonged stooping; <10 min;     How long can you walk comfortably?  200 feet with assistance requiring several breaks;     Diagnostic tests  has had MRI and brain imaging;     Patient Stated Goals  "Be able to travel more, little sight seeing; walk short distances" Patient reports that she would like to be  able to get rid of the walker.     Currently in Pain?  No/denies    Pain Onset  Today    Multiple Pain Sites  No        TREATMENT:  NMRE:  Standing on airex pad: -Heel-toe raisesx5 unsupported; increased DF and PF AROM with repetition Progressed to heel raises holding for up to 3 sec unsupported x10 reps  -alternate toe taps to 4 inch step with 0 rail assist x15 reps bilaterally; -Standing one foot on airex, one foot on 4 inch step, BUE ball pass side/side x5 reps each foot on step; required CGA-min A and increased time for better weight shift for improved stance control. Does fatigue with increased repetition;  -feet together:  Eyes open/closed 30 sec hold x1 rep each;Patient  exhibits increased left lateral trunk lean;  Instructed patient in standing on small rockerboard (lateral rocking) to reorient to midline:  Feet apart, unsupported standing teeter side/side x1 min;  Feet apart, unsupported standing with finding midline 30 sec hold  Progressed to feet together, unsupported standing finding midline 30 sec hold with CGA for safety and cues for weight shift, using visual cues for reorient to midline  Then had patient stand back on airex pad:  Feet together eyes open/closed 30 sec hold x1 set each with CGA for safety with less left lateral trunk lean;   Progressed with feet together eyes open, trunk/heat rotation side/side x5 reps each with cues for gaze stabilization for better dynamic balance control;   Standing on airex:  -modified tandem stance: BUE arm lift with looking up x5 reps with min A x1 set each foot in front with cues for lateral weight shift for better stance control;   Standing on firm surface outside parallel bars: March holding 3 sec with 1 finger tip hold x5 reps each LE  Progressing to march 3-5 sec hold without HHA x5 reps each LE; Requires Min A-CGA especially without fingertip hold with tactile cues for weight shift; Patient instructed to increase gluteal activation with less trendelenburg hip for better stance control; With cues she was able to increase hold time with SLS to 5 sec occasionally;      Neuro Re-ed: Patient requires Min A- CGA during all standing interventions due to limited stability and patient fear of LOB. Cueing for reduction of UE support required as well as postural alignment for optimal stability within COM    Patient demonstratesexcellentmotivation throughout today's session. Patient was able to perform progressed weight shift interventions with a focus on ankle strategies, requiredrepetition and min VC for improved form. Patient continues to be challenged byactivities involving SLS, which improved  withrepetition, demonstration, min TC and VC for sequencing and posture. Patient would continue to benefit from skilled PT to address the deficits outlined in this note.                  PT Education - 01/18/19 1255    Education Details  balance/progress towards goals, HEP    Person(s) Educated  Patient    Methods  Explanation;Verbal cues    Comprehension  Verbalized understanding;Verbal cues required;Returned demonstration;Need further instruction       PT Short Term Goals - 01/13/19 1153      PT SHORT TERM GOAL #1   Title  Patient will be adherent to HEP at least 3x a week to improve functional strength and balance for better safety at home.    Baseline  08/12/18- doing exercise daily;  Time  4    Period  Weeks    Status  Achieved    Target Date  08/17/18      PT SHORT TERM GOAL #2   Title   Patient will deny any falls over past 4 weeks to demonstrate improved safety awareness at home and work.     Baseline  08/12/18- fell 1 week ago; 09/23/18: none recent;    Time  4    Period  Weeks    Status  Achieved    Target Date  08/17/18        PT Long Term Goals - 01/13/19 1153      PT LONG TERM GOAL #1   Title  Patient will increase Berg Balance score by > 6 points to demonstrate decreased fall risk during functional activities.    Time  8    Period  Weeks    Status  Achieved      PT LONG TERM GOAL #2   Title  Patient will increase 10 meter walk test to >0.8 m/s as to improve gait speed for better community ambulation and to reduce fall risk.    Baseline  12/16/2018: 0.47 m/s;    Time  8    Period  Weeks    Status  Partially Met    Target Date  02/10/19      PT LONG TERM GOAL #3   Title  Patient will be require no assist with ascend/descend 3 steps using Least restrictive assistive device to improve safety with entry/exit home.     Baseline  12/16/2018: ascends and descends stairs with RUE rail assist.    Time  4    Period  Weeks    Status  Partially  Met    Target Date  02/10/19      PT LONG TERM GOAL #4   Title  Patient will increase BLE gross strength to 4+/5 as to improve functional strength for independent gait, increased standing tolerance and increased ADL ability.    Baseline  12/16/2018: BLE hip ext 3/5, BLE hip add 4-/5, BLE hip abd 4/5;    Time  4    Period  Weeks    Status  Partially Met    Target Date  02/10/19      PT LONG TERM GOAL #5   Title  Patient will increase Berg Balance score to >52/56 to demonstrate decreased fall risk during functional activities.    Baseline  12/16/2018: 48/56;    Time  4    Period  Weeks    Status  Partially Met    Target Date  02/10/19      PT LONG TERM GOAL #6   Title  Patient will be able to stand on one leg for >5 sec to indicate a decreased fall risk and increase ADL/IADL ability.    Baseline  12/16/2018: Unable to hold for more than 3 seconds, but able to remain standing.    Time  4    Period  Weeks    Status  Not Met    Target Date  02/10/19      PT LONG TERM GOAL #7   Title  patient will increase Mini Best test score to >18/28 to exhibit improved balance and reduce fall risk.    Time  4    Period  Weeks    Status  Achieved            Plan - 01/18/19 1255    Clinical  Impression Statement  Patient motivated and participated well within session. She was instructed in advanced balance tasks to reorient patient to midline. She continues to have heavy left lateral lean which improved with increased repetition. Patient requires increased time and mod VCs for correct exercise technique/weight shift for better stance control. She continues to have increased fear of falling. She would benefit from additional skilled PT Intervention to improve strength, balance and gait safety;    Rehab Potential  Good    PT Frequency  2x / week    PT Duration  4 weeks    PT Treatment/Interventions  Cryotherapy;Electrical Stimulation;Moist Heat;Gait training;Stair training;Functional mobility  training;Therapeutic activities;Therapeutic exercise;Balance training;Neuromuscular re-education;Patient/family education;Energy conservation    PT Next Visit Plan  Continue/progress ankle strategies and stepping correction, implement ADL training    Consulted and Agree with Plan of Care  Patient       Patient will benefit from skilled therapeutic intervention in order to improve the following deficits and impairments:  Abnormal gait, Decreased balance, Decreased endurance, Decreased mobility, Difficulty walking, Impaired perceived functional ability, Decreased activity tolerance, Decreased safety awareness  Visit Diagnosis: Muscle weakness (generalized)  Unsteadiness on feet  Difficulty in walking, not elsewhere classified     Problem List Patient Active Problem List   Diagnosis Date Noted  . NPH (normal pressure hydrocephalus) ([place]) 07/30/2016  . Left leg weakness 04/20/2016  . Menopausal osteoporosis 03/04/2016  . Abnormal gait 12/18/2015  . Left foot drop 12/18/2015  . BP (high blood pressure) 08/24/2015  . Hydronephrosis, left 07/28/2015  . UPJ (ureteropelvic junction) obstruction 07/28/2015  . Lower urinary tract infectious disease 07/14/2015  . Hydronephrosis 07/14/2015    Riven Mabile PT, DPT 01/18/2019, 1:48 PM  [facility] MAIN [facility] SERVICES [address] [place], [place], [address] Phone: [phone]   Fax:  [phone]  Name: Abigail Hill MRN: [id number] Date of Birth: 20-May-1950

## 2019-01-20 ENCOUNTER — Ambulatory Visit: Payer: PPO | Admitting: Physical Therapy

## 2019-01-20 ENCOUNTER — Other Ambulatory Visit: Payer: Self-pay

## 2019-01-20 ENCOUNTER — Encounter: Payer: Self-pay | Admitting: Physical Therapy

## 2019-01-20 DIAGNOSIS — M6281 Muscle weakness (generalized): Secondary | ICD-10-CM | POA: Diagnosis not present

## 2019-01-20 DIAGNOSIS — R262 Difficulty in walking, not elsewhere classified: Secondary | ICD-10-CM

## 2019-01-20 DIAGNOSIS — R2681 Unsteadiness on feet: Secondary | ICD-10-CM

## 2019-01-20 IMAGING — MG MM DIGITAL DIAGNOSTIC UNILAT*R*
4 series · 4 of 4 positions shown · non-contrast
Comparison: Previous exam(s).

CLINICAL DATA: Right breast lower inner quadrant calcifications
seen on baseline screening mammography.

EXAM:
DIGITAL DIAGNOSTIC RIGHT MAMMOGRAM WITH CAD

[R CC]
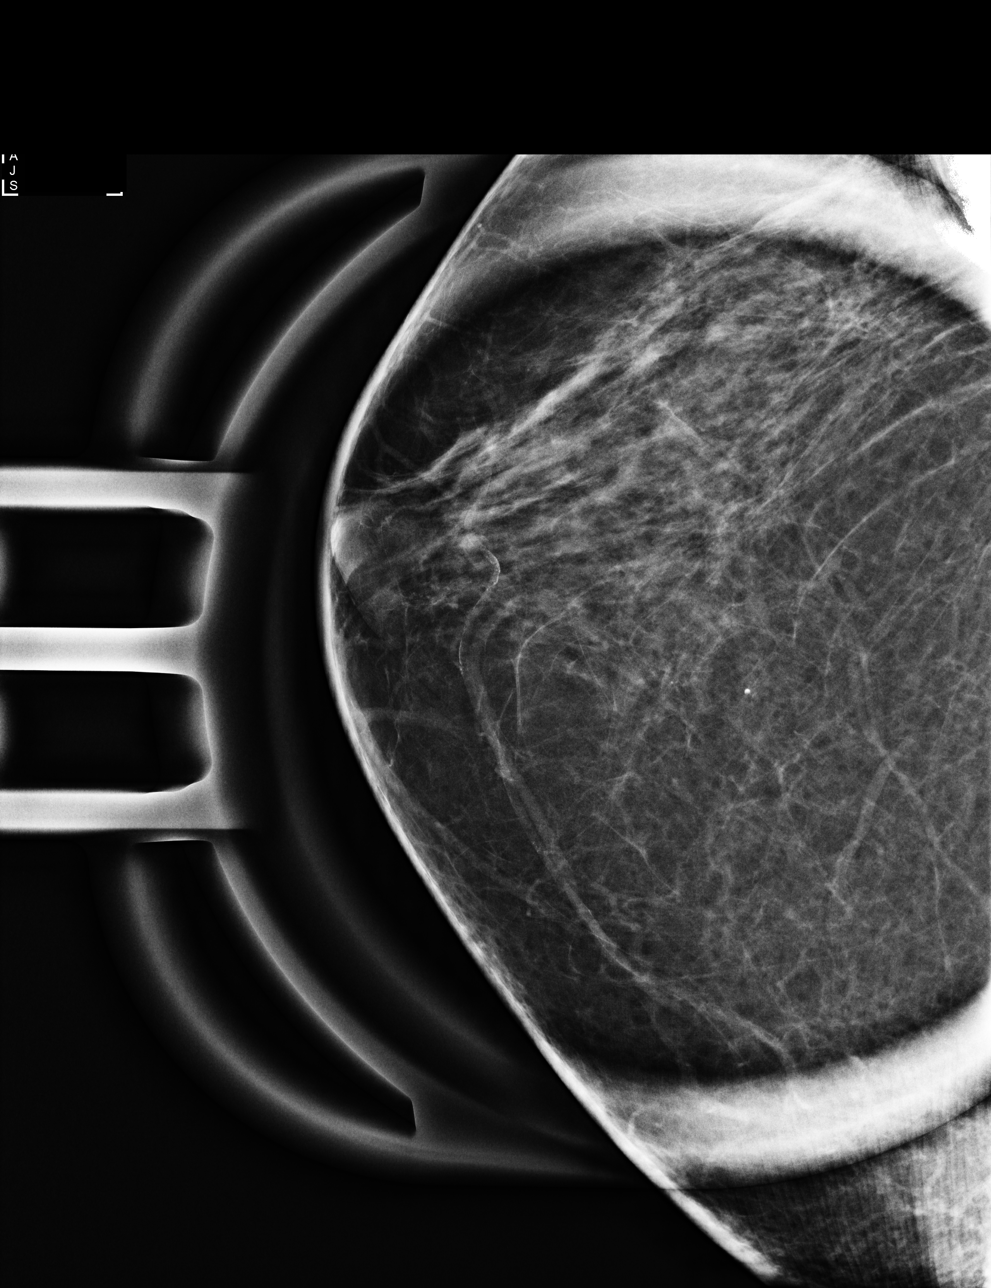

[R ML (1 of 3)]
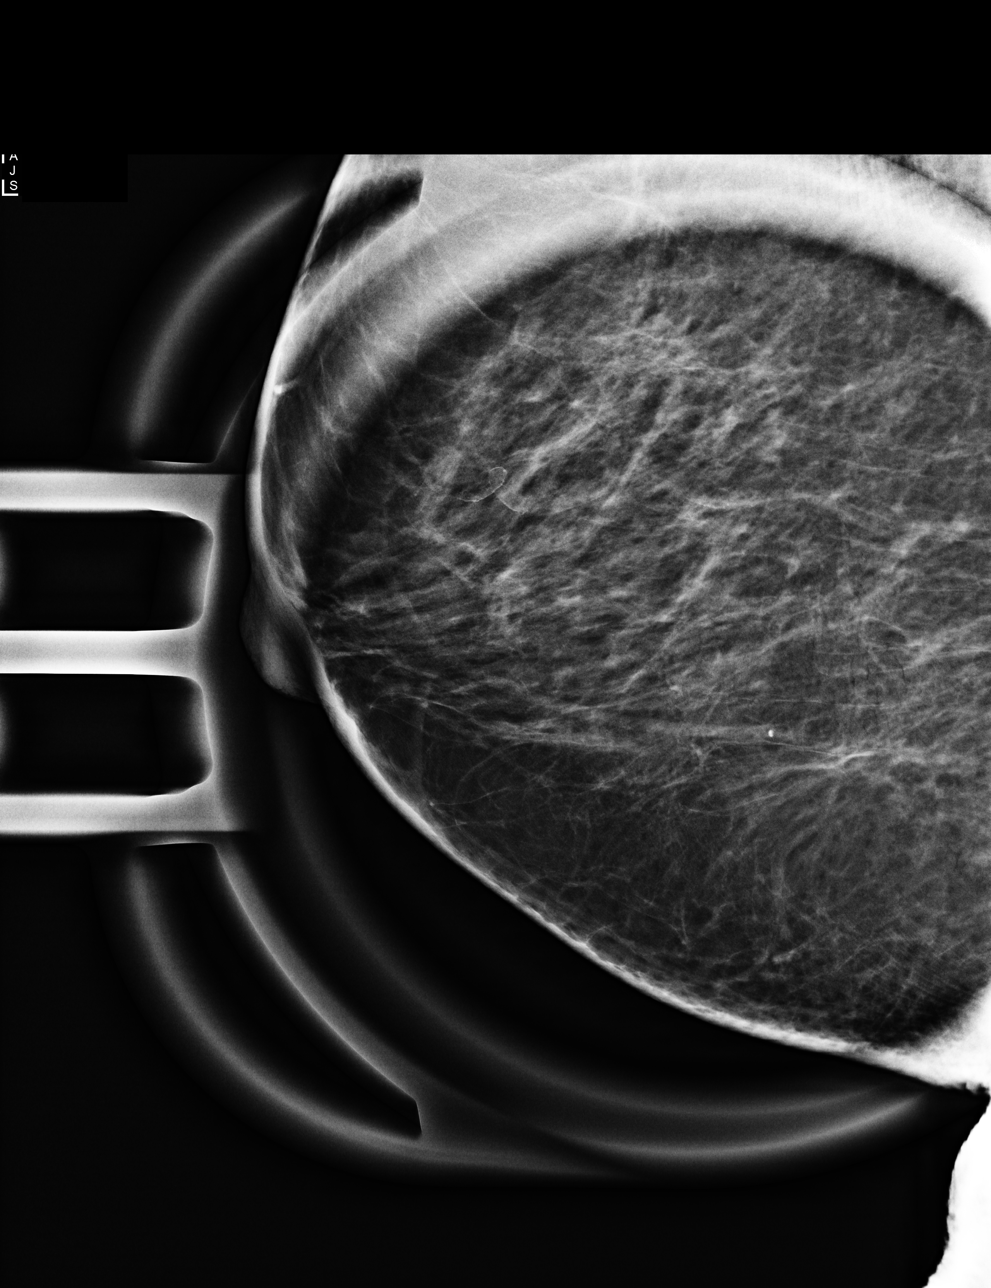

[R ML (2 of 3)]
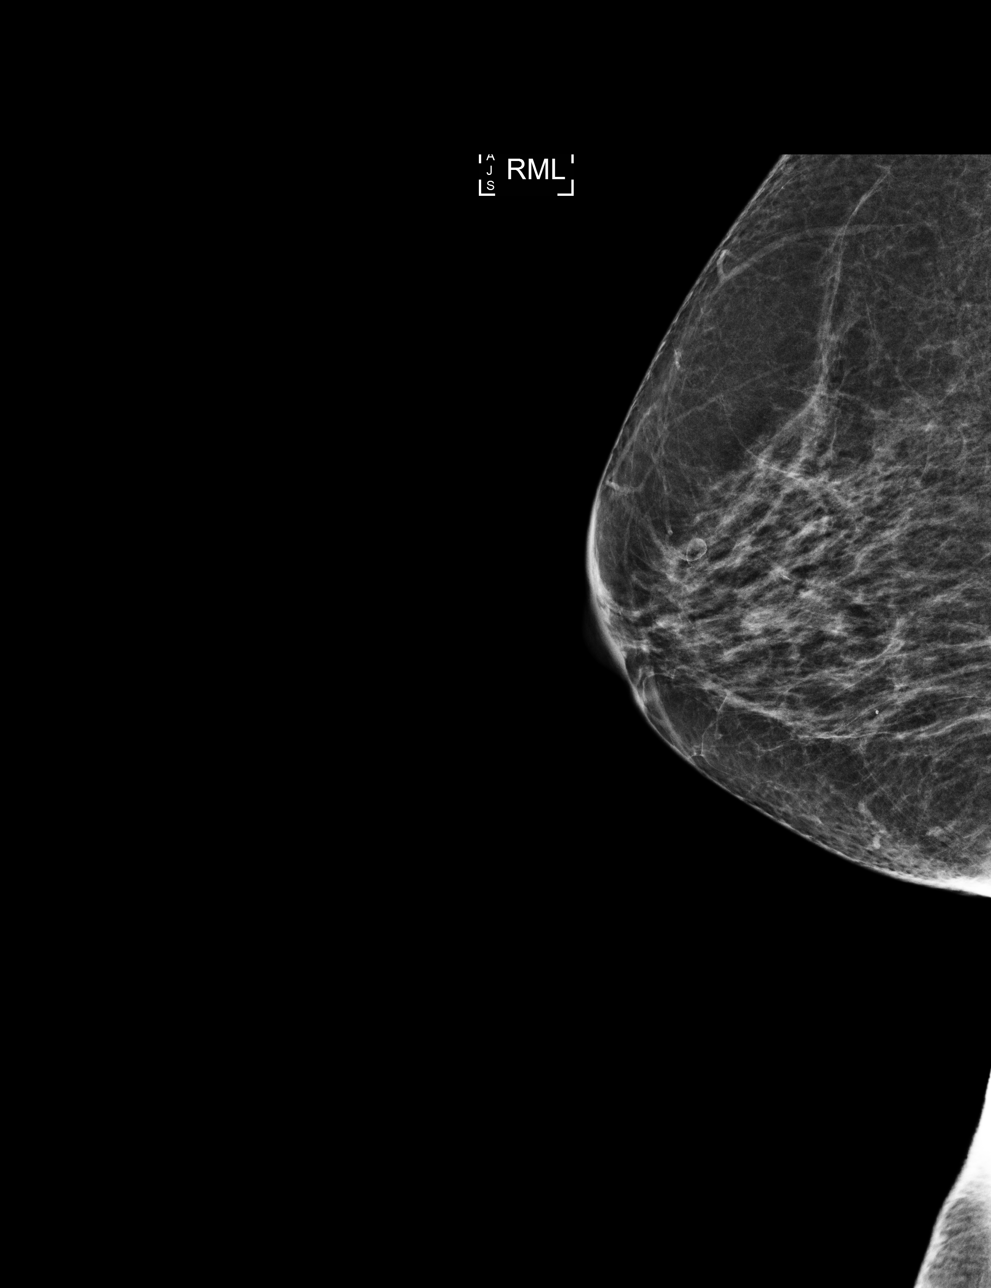

[R ML (3 of 3)]
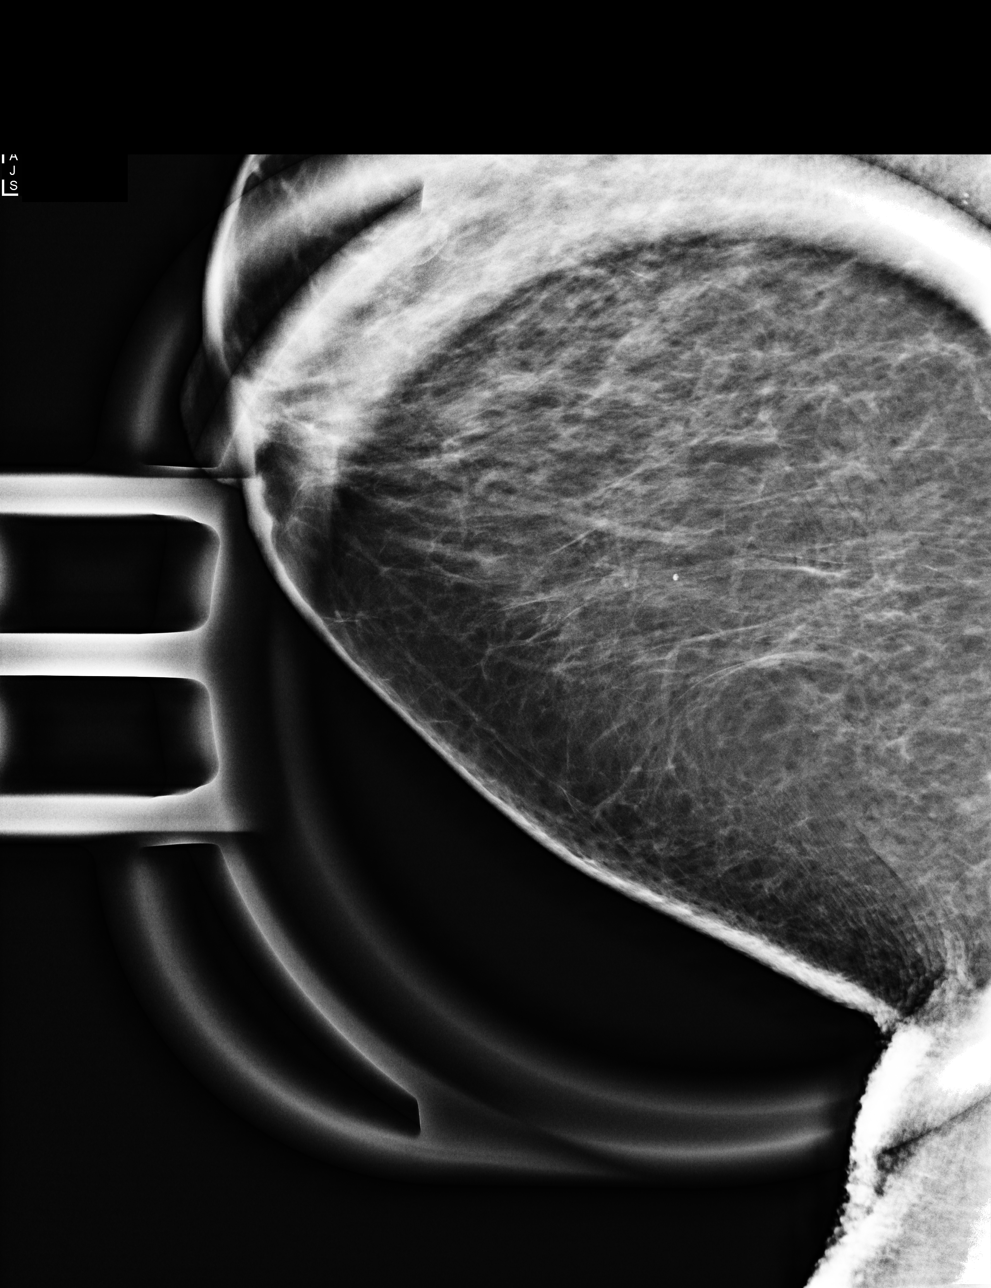

[4 of 4 positions shown; findings below may reference images not displayed]

ACR Breast Density Category b: There are scattered areas of
fibroglandular density.
FINDINGS: There is a group of faint round calcifications in the right breast
lower inner quadrant, middle depth, better seen on the 3D images
than on the compression magnification views. The morphology is
suggestive of benign etiology, potentially of dermal origin.

Mammographic images were processed with CAD.
IMPRESSION: Probably benign right breast lower inner quadrant calcifications for
which 6 month follow-up is recommended.

RECOMMENDATION:
Diagnostic mammogram of the right breast in 6 months.
(Code:[id number])

I have discussed the findings and recommendations with the patient.
Results were also provided in writing at the conclusion of the
visit. If applicable, a reminder letter will be sent to the patient
regarding the next appointment.

BI-RADS CATEGORY  3: Probably benign.

## 2019-01-20 NOTE — Therapy (Signed)
[place] MAIN [facility] SERVICES [address] [place], [place], [address] Phone: [phone]   Fax:  [phone]  Physical Therapy Treatment  Patient Details  Name: Abigail Hill MRN: [id number] Date of Birth: [birth year] Referring Provider (PT): Dr. Metta Clines   Encounter Date: 01/20/2019  PT End of Session - 01/20/19 1517    Visit Number  71    Number of Visits  28    Date for PT Re-Evaluation  02/10/19    Authorization Type  goals addressed 12/16/2018    PT Start Time  1147    PT Stop Time  1228    PT Time Calculation (min)  41 min    Equipment Utilized During Treatment  Gait belt    Activity Tolerance  Patient limited by fatigue;Patient tolerated treatment well    Behavior During Therapy  [facility] for tasks assessed/performed       Past Medical History:  Diagnosis Date  . Abnormal gait 12/18/2015  . BP (high blood pressure) 08/24/2015  . Cancer ([place])    skin  . Constriction of ureter (postoperative) 07/28/2015  . Hydronephrosis 07/14/2015  . Hypertension   . Left foot drop 12/18/2015  . Recurrent UTI   . UPJ (ureteropelvic junction) obstruction 07/28/2015  . UTI (lower urinary tract infection) 07/14/2015    Past Surgical History:  Procedure Laterality Date  . broken foot  2012  . COLONOSCOPY WITH PROPOFOL N/A 06/30/2017   Procedure: COLONOSCOPY WITH PROPOFOL;  Surgeon: Lollie Sails, MD;  Location: [facility] ENDOSCOPY;  Service: Endoscopy;  Laterality: N/A;  . kidney repair  1985   repaired torn blood vessel in kidney    There were no vitals filed for this visit.  Subjective Assessment - 01/20/19 1151    Subjective  Patient reports doing well; Denies any new falls; Denies any pain;    Pertinent History  68 yo Female reports instability with gait with difficulty walking. She states, "my left leg gives me trouble. It will  just plant and then I can't pick it up." Pt reports 2 falls in last month. She reports when she falls it is often  falling backwards; She was diagnosed with hydrocephalus and did a drain trial with no relief; She did not qualify for a shunt. Repeat imaging showed continued hydrocephalus that has not worsened; MRI of lumbar spine shows L5 pars defect with sacral insufficiency; She denies any back pain and reports no numbness in legs. She reports feeling frustration because they can't seem to find what's wrong with her. She does have urinary incontience; reports knowing when she has to go to the bathroom but can't get there fast enough; Patient has a walker and rollator; She uses both; She also has a manual wheelchair that she uses intermittently;     Limitations  Standing;Walking    How long can you sit comfortably?  NA    How long can you stand comfortably?  has pain in LLE with prolonged stooping; <10 min;     How long can you walk comfortably?  200 feet with assistance requiring several breaks;     Diagnostic tests  has had MRI and brain imaging;     Patient Stated Goals  "Be able to travel more, little sight seeing; walk short distances" Patient reports that she would like to be able to get rid of the walker.     Currently in Pain?  No/denies    Pain Onset  Today    Multiple Pain  Sites  No           TREATMENT:   NMRE:  Standing on airex pad: -Feet apart side/side weight shift with arms across chest x10 reps  -Heel-toe raisesx5 unsupported; increased DF and PF AROM with repetition Progressed to heel raises holding for up to 3 sec unsupported x10 reps  -alternate march with arms across chest x10 reps bilaterally; -feet together:  Arms across chest trunk/head rotations side/side x5 reps each             Eyes open/closed 30 sec hold x2 rep each;Patient exhibits increased left lateral trunk lean;  Requires CGA to min A for safety; PT attempted reducing assistance to allow patient to feel trunk lean for better hip/ankle strategies for righting response;   Standing on firm surface outside parallel  bars: March holding 3 sec with 1 finger tip hold x5 reps each LE  Unsupported march with cues for weight shift and to increase gluteal activation x5 reps each LE Progressing to march 3-5 sec hold without HHA x5 reps each LE; Requires Min A-CGA especially without fingertip hold with tactile cues for weight shift; Patient instructed to increase gluteal activation with less trendelenburg hip for better stance control; With cues she was able to increase hold time with SLS to 5 sec occasionally;   Resisted walking 12.5# forward/backwardx2 laps, side stepping x2 laps withmin A for safety and mod VCs for weight shift and to increase step length for better gait safety; Patient had increased difficulty with less assistance (reduced to CGA) with increased shakiness and unsteadiness. Required increased cues to slow down and stop, improve weight shift and then try to step for better gait safety;   Neuro Re-ed: Patient requires Min A- CGA during all standing interventions due to limited stability and patient fear of LOB. Cueing for reduction of UE support required as well as postural alignment for optimal stability within COM    Patient demonstratesexcellentmotivation throughout today's session. Patient was able to perform progressed weight shift interventions with a focus on ankle strategies, requiredrepetition and min VC for improved form. Patient continues to be challenged byactivities involving SLS, which improved withrepetition, demonstration, min TC and VC for sequencing and posture.            PT Education - 01/20/19 1517    Education Details  balance, HEP reinforced, gait safety    Person(s) Educated  Patient    Methods  Explanation;Verbal cues    Comprehension  Verbalized understanding;Returned demonstration;Verbal cues required;Need further instruction       PT Short Term Goals - 01/13/19 1153      PT SHORT TERM GOAL #1   Title  Patient will be adherent to HEP at least 3x  a week to improve functional strength and balance for better safety at home.    Baseline  08/12/18- doing exercise daily;    Time  4    Period  Weeks    Status  Achieved    Target Date  08/17/18      PT SHORT TERM GOAL #2   Title   Patient will deny any falls over past 4 weeks to demonstrate improved safety awareness at home and work.     Baseline  08/12/18- fell 1 week ago; 09/23/18: none recent;    Time  4    Period  Weeks    Status  Achieved    Target Date  08/17/18        PT Long Term Goals -  01/13/19 1153      PT LONG TERM GOAL #1   Title  Patient will increase Berg Balance score by > 6 points to demonstrate decreased fall risk during functional activities.    Time  8    Period  Weeks    Status  Achieved      PT LONG TERM GOAL #2   Title  Patient will increase 10 meter walk test to >0.8 m/s as to improve gait speed for better community ambulation and to reduce fall risk.    Baseline  12/16/2018: 0.47 m/s;    Time  8    Period  Weeks    Status  Partially Met    Target Date  02/10/19      PT LONG TERM GOAL #3   Title  Patient will be require no assist with ascend/descend 3 steps using Least restrictive assistive device to improve safety with entry/exit home.     Baseline  12/16/2018: ascends and descends stairs with RUE rail assist.    Time  4    Period  Weeks    Status  Partially Met    Target Date  02/10/19      PT LONG TERM GOAL #4   Title  Patient will increase BLE gross strength to 4+/5 as to improve functional strength for independent gait, increased standing tolerance and increased ADL ability.    Baseline  12/16/2018: BLE hip ext 3/5, BLE hip add 4-/5, BLE hip abd 4/5;    Time  4    Period  Weeks    Status  Partially Met    Target Date  02/10/19      PT LONG TERM GOAL #5   Title  Patient will increase Berg Balance score to >52/56 to demonstrate decreased fall risk during functional activities.    Baseline  12/16/2018: 48/56;    Time  4    Period  Weeks     Status  Partially Met    Target Date  02/10/19      PT LONG TERM GOAL #6   Title  Patient will be able to stand on one leg for >5 sec to indicate a decreased fall risk and increase ADL/IADL ability.    Baseline  12/16/2018: Unable to hold for more than 3 seconds, but able to remain standing.    Time  4    Period  Weeks    Status  Not Met    Target Date  02/10/19      PT LONG TERM GOAL #7   Title  patient will increase Mini Best test score to >18/28 to exhibit improved balance and reduce fall risk.    Time  4    Period  Weeks    Status  Achieved            Plan - 01/20/19 1518    Clinical Impression Statement  Patient motivated and participated well within session. Instructed patient in advanced balance tasks. She continues to require cues and increased time for weight shift to midline. She is able to follow cues well with improved stance control. Patient able to progress SLS today with 5 sec on LLE and 3 sec on RLE. Patient continues to ambulate with wide base of support, short step length with slower gait speed. She requires CGA to min A with most balance tasks. Patient would benefit from additional skilled PT Intervention to improve strength, balance and gait safety;    Rehab Potential  Good  PT Frequency  2x / week    PT Duration  4 weeks    PT Treatment/Interventions  Cryotherapy;Electrical Stimulation;Moist Heat;Gait training;Stair training;Functional mobility training;Therapeutic activities;Therapeutic exercise;Balance training;Neuromuscular re-education;Patient/family education;Energy conservation    PT Next Visit Plan  Continue/progress ankle strategies and stepping correction, implement ADL training    Consulted and Agree with Plan of Care  Patient       Patient will benefit from skilled therapeutic intervention in order to improve the following deficits and impairments:  Abnormal gait, Decreased balance, Decreased endurance, Decreased mobility, Difficulty walking,  Impaired perceived functional ability, Decreased activity tolerance, Decreased safety awareness  Visit Diagnosis: Muscle weakness (generalized)  Unsteadiness on feet  Difficulty in walking, not elsewhere classified     Problem List Patient Active Problem List   Diagnosis Date Noted  . NPH (normal pressure hydrocephalus) ([place]) 07/30/2016  . Left leg weakness 04/20/2016  . Menopausal osteoporosis 03/04/2016  . Abnormal gait 12/18/2015  . Left foot drop 12/18/2015  . BP (high blood pressure) 08/24/2015  . Hydronephrosis, left 07/28/2015  . UPJ (ureteropelvic junction) obstruction 07/28/2015  . Lower urinary tract infectious disease 07/14/2015  . Hydronephrosis 07/14/2015    , PT, DPT 01/20/2019, 3:41 PM  [place] MAIN [facility] SERVICES [address] [place], [place], [address] Phone: [phone]   Fax:  [phone]  Name: Abigail Hill MRN: [id number] Date of Birth: 03-Sep-1950

## 2019-01-25 ENCOUNTER — Ambulatory Visit: Payer: PPO | Admitting: Physical Therapy

## 2019-01-25 ENCOUNTER — Encounter: Payer: Self-pay | Admitting: Physical Therapy

## 2019-01-25 ENCOUNTER — Other Ambulatory Visit: Payer: Self-pay

## 2019-01-25 DIAGNOSIS — M6281 Muscle weakness (generalized): Secondary | ICD-10-CM

## 2019-01-25 DIAGNOSIS — R262 Difficulty in walking, not elsewhere classified: Secondary | ICD-10-CM

## 2019-01-25 DIAGNOSIS — R2681 Unsteadiness on feet: Secondary | ICD-10-CM

## 2019-01-25 NOTE — Therapy (Signed)
[place] MAIN [facility] SERVICES [address] [place], [place], [address] Phone: [phone]   Fax:  [phone]  Physical Therapy Treatment Physical Therapy Progress Note   Dates of reporting period  12/16/18  to   01/25/19   Patient Details  Name: Abigail Hill MRN: [id number] Date of Birth: 07/29/1950 Referring Provider (PT): Dr. Metta Clines   Encounter Date: 01/25/2019  PT End of Session - 01/25/19 1153    Visit Number  50    Number of Visits  65    Date for PT Re-Evaluation  02/10/19    Authorization Type  goals addressed 12/16/2018    PT Start Time  1150    PT Stop Time  1230    PT Time Calculation (min)  40 min    Equipment Utilized During Treatment  Gait belt    Activity Tolerance  Patient limited by fatigue;Patient tolerated treatment well    Behavior During Therapy  [facility] for tasks assessed/performed       Past Medical History:  Diagnosis Date  . Abnormal gait 12/18/2015  . BP (high blood pressure) 08/24/2015  . Cancer ([place])    skin  . Constriction of ureter (postoperative) 07/28/2015  . Hydronephrosis 07/14/2015  . Hypertension   . Left foot drop 12/18/2015  . Recurrent UTI   . UPJ (ureteropelvic junction) obstruction 07/28/2015  . UTI (lower urinary tract infection) 07/14/2015    Past Surgical History:  Procedure Laterality Date  . broken foot  2012  . COLONOSCOPY WITH PROPOFOL N/A 06/30/2017   Procedure: COLONOSCOPY WITH PROPOFOL;  Surgeon: Lollie Sails, MD;  Location: [facility] ENDOSCOPY;  Service: Endoscopy;  Laterality: N/A;  . kidney repair  1985   repaired torn blood vessel in kidney    There were no vitals filed for this visit.  Subjective Assessment - 01/25/19 1153    Subjective  Patient reports having a busy weekend with her family and has had a harder time with her HEP; She denies any new falls;    Pertinent History  68 yo Female reports instability with gait with difficulty walking. She states, "my left leg  gives me trouble. It will  just plant and then I can't pick it up." Pt reports 2 falls in last month. She reports when she falls it is often falling backwards; She was diagnosed with hydrocephalus and did a drain trial with no relief; She did not qualify for a shunt. Repeat imaging showed continued hydrocephalus that has not worsened; MRI of lumbar spine shows L5 pars defect with sacral insufficiency; She denies any back pain and reports no numbness in legs. She reports feeling frustration because they can't seem to find what's wrong with her. She does have urinary incontience; reports knowing when she has to go to the bathroom but can't get there fast enough; Patient has a walker and rollator; She uses both; She also has a manual wheelchair that she uses intermittently;     Limitations  Standing;Walking    How long can you sit comfortably?  NA    How long can you stand comfortably?  has pain in LLE with prolonged stooping; <10 min;     How long can you walk comfortably?  200 feet with assistance requiring several breaks;     Diagnostic tests  has had MRI and brain imaging;     Patient Stated Goals  "Be able to travel more, little sight seeing; walk short distances" Patient reports that she would like  to be able to get rid of the walker.     Currently in Pain?  No/denies    Pain Onset  Today    Multiple Pain Sites  No          TREATMENT: Exercise:  Leg press: BLE 105# 2x15 with cues for proper positioning to improve motor control. Patient able to put legs on machine well with good control;  BLE heel raise 105# x15 with min VCs to keep knee straight for better calf strengthening;    Standing with 2# ankle weights: -hip extension x15 bilaterally; -Hip abduction x15 bilaterally; Patient required min-moderate verbal/tactile cues for correct exercise technique with cues to avoid trunk rotation and to keep foot oriented forward for better hip strengthening;    NMRE:   Standing on airex  pad: -Feet apart side/side weight shift with arms across chest x10 reps  -feet together:             Arms across chest trunk/head rotations side/side x5 reps each             Eyes open/closed 30 sec hold x2 rep each;Patient exhibits increased left lateral trunk lean;  -mini squat unsupported x10 reps; -Standing one foot on airex, one foot on 4 inch step unsupported:  BUE ball pass x5 reps each foot on step with min A for safety; Patient had increased difficulty with left foot on step and right foot on airex due to imbalance and decreased weight shift to RLE;  Requires min A for safety; PT attempted reducing assistance to allow patient to feel trunk lean for better hip/ankle strategies for righting response;    Patient ambulated 100 feet at end of session with better step length and foot clearance throughout. She does have wide base of support with occasional left foot drag especially when walking in narrow environment. Require supervision for safety and mod VCs to increase step length for better gait mechanics;   Patient demonstrates excellent motivation throughout today's session. Patient was able to perform progressed weight shift interventions with a focus on ankle strategies, required repetition and min VC for improved form.  Patient's condition has the potential to improve in response to therapy. Maximum improvement is yet to be obtained. The anticipated improvement is attainable and reasonable in a generally predictable time.  Patient reports adherence with HEP and reports She has been able to stand for longer periods of time with less fatigue;    Goals were recently addressed on 01/13/19, please see recent note for updated outcome measures.                      PT Education - 01/25/19 1153    Education Details  balance/HEP/strengthening;    Person(s) Educated  Patient    Methods  Explanation;Verbal cues    Comprehension  Verbalized understanding;Returned  demonstration;Verbal cues required;Need further instruction       PT Short Term Goals - 01/25/19 1158      PT SHORT TERM GOAL #1   Title  Patient will be adherent to HEP at least 3x a week to improve functional strength and balance for better safety at home.    Baseline  08/12/18- doing exercise daily;    Time  4    Period  Weeks    Status  Achieved    Target Date  08/17/18      PT SHORT TERM GOAL #2   Title   Patient will deny any falls over past 4  weeks to demonstrate improved safety awareness at home and work.     Baseline  08/12/18- fell 1 week ago; 09/23/18: none recent;    Time  4    Period  Weeks    Status  Achieved    Target Date  08/17/18        PT Long Term Goals - 01/25/19 1158      PT LONG TERM GOAL #1   Title  Patient will increase Berg Balance score by > 6 points to demonstrate decreased fall risk during functional activities.    Time  8    Period  Weeks    Status  Achieved    Target Date  02/10/19      PT LONG TERM GOAL #2   Title  Patient will increase 10 meter walk test to >0.8 m/s as to improve gait speed for better community ambulation and to reduce fall risk.    Baseline  12/16/2018: 0.47 m/s;    Time  8    Period  Weeks    Status  Partially Met    Target Date  02/10/19      PT LONG TERM GOAL #3   Title  Patient will be require no assist with ascend/descend 3 steps using Least restrictive assistive device to improve safety with entry/exit home.     Baseline  12/16/2018: ascends and descends stairs with RUE rail assist.    Time  4    Period  Weeks    Status  Partially Met    Target Date  02/10/19      PT LONG TERM GOAL #4   Title  Patient will increase BLE gross strength to 4+/5 as to improve functional strength for independent gait, increased standing tolerance and increased ADL ability.    Baseline  12/16/2018: BLE hip ext 3/5, BLE hip add 4-/5, BLE hip abd 4/5;    Time  4    Period  Weeks    Status  Partially Met    Target Date  02/10/19       PT LONG TERM GOAL #5   Title  Patient will increase Berg Balance score to >52/56 to demonstrate decreased fall risk during functional activities.    Baseline  12/16/2018: 48/56;    Time  4    Period  Weeks    Status  Partially Met    Target Date  02/10/19      PT LONG TERM GOAL #6   Title  Patient will be able to stand on one leg for >5 sec to indicate a decreased fall risk and increase ADL/IADL ability.    Baseline  12/16/2018: Unable to hold for more than 3 seconds, but able to remain standing.    Time  4    Period  Weeks    Status  Not Met    Target Date  02/10/19      PT LONG TERM GOAL #7   Title  patient will increase Mini Best test score to >18/28 to exhibit improved balance and reduce fall risk.    Time  4    Period  Weeks    Status  Achieved    Target Date  02/10/19            Plan - 01/25/19 1157    Clinical Impression Statement  Patient motivated and participated well within session. Instructed patient in advanced LE strengthening. Progressed strengthening with increased resistance/repetition. Patient does require min VCs for correct exercise technique for  optimal muscle activation and strengthening. Patient instructed in advanced balance tasks. She requires CGA to min A with advanced balance exercise especially when standing with narrow base of support or unsupported. Patient continues to have increased left lateral lean but was able to lift LLE better this session following exercise. She would benefit from additional skilled PT intervention to improve strength, balance and mobility;    Rehab Potential  Good    PT Frequency  2x / week    PT Duration  4 weeks    PT Treatment/Interventions  Cryotherapy;Electrical Stimulation;Moist Heat;Gait training;Stair training;Functional mobility training;Therapeutic activities;Therapeutic exercise;Balance training;Neuromuscular re-education;Patient/family education;Energy conservation    PT Next Visit Plan  Continue/progress ankle  strategies and stepping correction, implement ADL training    Consulted and Agree with Plan of Care  Patient       Patient will benefit from skilled therapeutic intervention in order to improve the following deficits and impairments:  Abnormal gait, Decreased balance, Decreased endurance, Decreased mobility, Difficulty walking, Impaired perceived functional ability, Decreased activity tolerance, Decreased safety awareness  Visit Diagnosis: Muscle weakness (generalized)  Unsteadiness on feet  Difficulty in walking, not elsewhere classified     Problem List Patient Active Problem List   Diagnosis Date Noted  . NPH (normal pressure hydrocephalus) ([place]) 07/30/2016  . Left leg weakness 04/20/2016  . Menopausal osteoporosis 03/04/2016  . Abnormal gait 12/18/2015  . Left foot drop 12/18/2015  . BP (high blood pressure) 08/24/2015  . Hydronephrosis, left 07/28/2015  . UPJ (ureteropelvic junction) obstruction 07/28/2015  . Lower urinary tract infectious disease 07/14/2015  . Hydronephrosis 07/14/2015    Lattie Cervi PT, DPT 01/25/2019, 2:31 PM  [place] MAIN [facility] SERVICES [address] [place], [place], [address] Phone: [phone]   Fax:  [phone]  Name: Abigail Hill MRN: [id number] Date of Birth: 14-Jul-1950

## 2019-02-01 ENCOUNTER — Ambulatory Visit: Payer: PPO | Attending: Neurology | Admitting: Physical Therapy

## 2019-02-01 ENCOUNTER — Encounter: Payer: Self-pay | Admitting: Physical Therapy

## 2019-02-01 ENCOUNTER — Other Ambulatory Visit: Payer: Self-pay

## 2019-02-01 DIAGNOSIS — R2681 Unsteadiness on feet: Secondary | ICD-10-CM | POA: Insufficient documentation

## 2019-02-01 DIAGNOSIS — R262 Difficulty in walking, not elsewhere classified: Secondary | ICD-10-CM | POA: Diagnosis not present

## 2019-02-01 DIAGNOSIS — R29898 Other symptoms and signs involving the musculoskeletal system: Secondary | ICD-10-CM | POA: Diagnosis not present

## 2019-02-01 DIAGNOSIS — R269 Unspecified abnormalities of gait and mobility: Secondary | ICD-10-CM | POA: Insufficient documentation

## 2019-02-01 DIAGNOSIS — M6281 Muscle weakness (generalized): Secondary | ICD-10-CM | POA: Diagnosis not present

## 2019-02-01 NOTE — Therapy (Signed)
[place] MAIN [facility] SERVICES [address] [place], [place], [address] Phone: [phone]   Fax:  [phone]  Physical Therapy Treatment  Patient Details  Name: Abigail Hill MRN: [id number] Date of Birth: 11/24/1950 Referring Provider (PT): Dr. Metta Clines   Encounter Date: 02/01/2019  PT End of Session - 02/01/19 1100    Visit Number  102    Number of Visits  23    Date for PT Re-Evaluation  02/10/19    Authorization Type  goals addressed 12/16/2018    PT Start Time  1102    PT Stop Time  1145    PT Time Calculation (min)  43 min    Equipment Utilized During Treatment  Gait belt    Activity Tolerance  Patient limited by fatigue;Patient tolerated treatment well    Behavior During Therapy  [facility] for tasks assessed/performed       Past Medical History:  Diagnosis Date  . Abnormal gait 12/18/2015  . BP (high blood pressure) 08/24/2015  . Cancer ([place])    skin  . Constriction of ureter (postoperative) 07/28/2015  . Hydronephrosis 07/14/2015  . Hypertension   . Left foot drop 12/18/2015  . Recurrent UTI   . UPJ (ureteropelvic junction) obstruction 07/28/2015  . UTI (lower urinary tract infection) 07/14/2015    Past Surgical History:  Procedure Laterality Date  . broken foot  2012  . COLONOSCOPY WITH PROPOFOL N/A 06/30/2017   Procedure: COLONOSCOPY WITH PROPOFOL;  Surgeon: Lollie Sails, MD;  Location: [facility] ENDOSCOPY;  Service: Endoscopy;  Laterality: N/A;  . kidney repair  1985   repaired torn blood vessel in kidney    There were no vitals filed for this visit.  Subjective Assessment - 02/01/19 1110    Subjective  Patient reports doing well. Denies any new falls. Reports having a slower holiday weekend as family was unable to come and visit. She reports having some good days with walking and other days having worse days with her balance.    Pertinent History  68 yo Female reports instability with gait with difficulty walking. She  states, "my left leg gives me trouble. It will  just plant and then I can't pick it up." Pt reports 2 falls in last month. She reports when she falls it is often falling backwards; She was diagnosed with hydrocephalus and did a drain trial with no relief; She did not qualify for a shunt. Repeat imaging showed continued hydrocephalus that has not worsened; MRI of lumbar spine shows L5 pars defect with sacral insufficiency; She denies any back pain and reports no numbness in legs. She reports feeling frustration because they can't seem to find what's wrong with her. She does have urinary incontience; reports knowing when she has to go to the bathroom but can't get there fast enough; Patient has a walker and rollator; She uses both; She also has a manual wheelchair that she uses intermittently;     Limitations  Standing;Walking    How long can you sit comfortably?  NA    How long can you stand comfortably?  has pain in LLE with prolonged stooping; <10 min;     How long can you walk comfortably?  200 feet with assistance requiring several breaks;     Diagnostic tests  has had MRI and brain imaging;     Patient Stated Goals  "Be able to travel more, little sight seeing; walk short distances" Patient reports that she would like to be  able to get rid of the walker.     Currently in Pain?  No/denies    Pain Onset  Today    Multiple Pain Sites  No             TREATMENT: Exercise:  Leg press: BLE 120# 2x15 with cues for proper positioning to improve motor control. Patient able to put legs on machine well with good control;  BLE heel raise 120# x15 with min VCs to keep knee straight for better calf strengthening;   Standing with 2.5# ankle weights: -hip extension x15 bilaterally; -Hip abduction x15 bilaterally; -hamstring curl x15 bilaterally; Patient required min-moderate verbal/tactile cues for correct exercise technique with cues to avoid trunk rotation and to keep foot oriented forward for  better hip strengthening;    NMRE: Standing on airex pad: -Feet apart eyes open/closed 30 sec hold x1 set each; -feet together: Arms across chest trunk/head rotations side/side x5 reps each Eyes open/closed 30 sec hold x2rep each;Patient exhibits increased left lateral trunk lean;  -alternate toe taps to 4 inch step x15 unsupported with min A to CGA for safety with min VCs for weight shift for better dynamic balance;  Requires min A for safety; PT attempted reducing assistance to allow patient to feel trunk lean for better hip/ankle strategies for righting response;   Standing on firm surfaceoutside parallel bars: March holding 5 sec with 1 finger tip holdx5 reps each LE Progressing to SLS 3-5sec hold without HHA x5 reps each LE; RequiresMin A-CGA especially without fingertip hold with tactile cues for weight shift;Patient instructed to increase gluteal activation with less trendelenburg hip for better stance control; With cues she was able to increase hold time with SLS to 5 sec occasionally;  Patient ambulated 100 feet at end of session with better step length and foot clearance throughout. She does have wide base of support with occasional left foot drag especially when walking in narrow environment. Require supervision for safety and mod VCs to increase step length for better gait mechanics;   Neuro Re-ed: Patient requiresMin A-CGA during all standing interventions due to limited stability and patient fear of LOB. Cueing for reduction of UE support required as well as postural alignment for optimal stability within COM                    PT Education - 02/01/19 1100    Education Details  balance/strengthening, HEP    Person(s) Educated  Patient    Methods  Explanation;Verbal cues    Comprehension  Verbalized understanding;Returned demonstration;Verbal cues required;Need further instruction       PT Short Term Goals -  01/25/19 1158      PT SHORT TERM GOAL #1   Title  Patient will be adherent to HEP at least 3x a week to improve functional strength and balance for better safety at home.    Baseline  08/12/18- doing exercise daily;    Time  4    Period  Weeks    Status  Achieved    Target Date  08/17/18      PT SHORT TERM GOAL #2   Title   Patient will deny any falls over past 4 weeks to demonstrate improved safety awareness at home and work.     Baseline  08/12/18- fell 1 week ago; 09/23/18: none recent;    Time  4    Period  Weeks    Status  Achieved    Target Date  08/17/18  PT Long Term Goals - 01/25/19 1158      PT LONG TERM GOAL #1   Title  Patient will increase Berg Balance score by > 6 points to demonstrate decreased fall risk during functional activities.    Time  8    Period  Weeks    Status  Achieved    Target Date  02/10/19      PT LONG TERM GOAL #2   Title  Patient will increase 10 meter walk test to >0.8 m/s as to improve gait speed for better community ambulation and to reduce fall risk.    Baseline  12/16/2018: 0.47 m/s;    Time  8    Period  Weeks    Status  Partially Met    Target Date  02/10/19      PT LONG TERM GOAL #3   Title  Patient will be require no assist with ascend/descend 3 steps using Least restrictive assistive device to improve safety with entry/exit home.     Baseline  12/16/2018: ascends and descends stairs with RUE rail assist.    Time  4    Period  Weeks    Status  Partially Met    Target Date  02/10/19      PT LONG TERM GOAL #4   Title  Patient will increase BLE gross strength to 4+/5 as to improve functional strength for independent gait, increased standing tolerance and increased ADL ability.    Baseline  12/16/2018: BLE hip ext 3/5, BLE hip add 4-/5, BLE hip abd 4/5;    Time  4    Period  Weeks    Status  Partially Met    Target Date  02/10/19      PT LONG TERM GOAL #5   Title  Patient will increase Berg Balance score to >52/56 to  demonstrate decreased fall risk during functional activities.    Baseline  12/16/2018: 48/56;    Time  4    Period  Weeks    Status  Partially Met    Target Date  02/10/19      PT LONG TERM GOAL #6   Title  Patient will be able to stand on one leg for >5 sec to indicate a decreased fall risk and increase ADL/IADL ability.    Baseline  12/16/2018: Unable to hold for more than 3 seconds, but able to remain standing.    Time  4    Period  Weeks    Status  Not Met    Target Date  02/10/19      PT LONG TERM GOAL #7   Title  patient will increase Mini Best test score to >18/28 to exhibit improved balance and reduce fall risk.    Time  4    Period  Weeks    Status  Achieved    Target Date  02/10/19            Plan - 02/01/19 1132    Clinical Impression Statement  Patient motivated and participated well within session. Instructed patient in advanced strengthening with increased resistance/repetition. She does require min VCs for proper exercise technique. Patient reports less fatigue this session, requiring fewer rest breaks. Instructed patient in advanced balance exercise. She continues to exhibit heavy left lateral lean. Instructed patient in advanced balance exercise to challenge weight shift. She is progressing well with better ankle strategies and improved weight shift with less rail assist. She continues to require min A for safety. Patient would  benefit from additional skilled PT intervention to improve strength, balance and gait safety;    Rehab Potential  Good    PT Frequency  2x / week    PT Duration  4 weeks    PT Treatment/Interventions  Cryotherapy;Electrical Stimulation;Moist Heat;Gait training;Stair training;Functional mobility training;Therapeutic activities;Therapeutic exercise;Balance training;Neuromuscular re-education;Patient/family education;Energy conservation    PT Next Visit Plan  Continue/progress ankle strategies and stepping correction, implement ADL training     Consulted and Agree with Plan of Care  Patient       Patient will benefit from skilled therapeutic intervention in order to improve the following deficits and impairments:  Abnormal gait, Decreased balance, Decreased endurance, Decreased mobility, Difficulty walking, Impaired perceived functional ability, Decreased activity tolerance, Decreased safety awareness  Visit Diagnosis: Muscle weakness (generalized)  Unsteadiness on feet  Difficulty in walking, not elsewhere classified     Problem List Patient Active Problem List   Diagnosis Date Noted  . NPH (normal pressure hydrocephalus) ([place]) 07/30/2016  . Left leg weakness 04/20/2016  . Menopausal osteoporosis 03/04/2016  . Abnormal gait 12/18/2015  . Left foot drop 12/18/2015  . BP (high blood pressure) 08/24/2015  . Hydronephrosis, left 07/28/2015  . UPJ (ureteropelvic junction) obstruction 07/28/2015  . Lower urinary tract infectious disease 07/14/2015  . Hydronephrosis 07/14/2015    Torianna Junio PT, DPT 02/01/2019, 11:49 AM  [place] MAIN [facility] SERVICES [address] [place], [place], [address] Phone: [phone]   Fax:  [phone]  Name: Abigail Hill MRN: [id number] Date of Birth: 01-Jan-1951

## 2019-02-03 ENCOUNTER — Encounter: Payer: Self-pay | Admitting: Physical Therapy

## 2019-02-03 ENCOUNTER — Other Ambulatory Visit: Payer: Self-pay

## 2019-02-03 ENCOUNTER — Ambulatory Visit: Payer: PPO | Admitting: Physical Therapy

## 2019-02-03 DIAGNOSIS — R262 Difficulty in walking, not elsewhere classified: Secondary | ICD-10-CM

## 2019-02-03 DIAGNOSIS — R2681 Unsteadiness on feet: Secondary | ICD-10-CM

## 2019-02-03 DIAGNOSIS — M6281 Muscle weakness (generalized): Secondary | ICD-10-CM | POA: Diagnosis not present

## 2019-02-03 NOTE — Therapy (Signed)
[place] MAIN [facility] SERVICES [address] [place], [place], [address] Phone: [phone]   Fax:  [phone]  Physical Therapy Treatment  Patient Details  Name: Abigail Hill MRN: [id number] Date of Birth: 1950-09-18 Referring Provider (PT): Dr. Metta Clines   Encounter Date: 02/03/2019  PT End of Session - 02/03/19 1055    Visit Number  52    Number of Visits  94    Date for PT Re-Evaluation  02/10/19    Authorization Type  goals addressed 12/16/2018    PT Start Time  1055    PT Stop Time  1142    PT Time Calculation (min)  47 min    Equipment Utilized During Treatment  Gait belt    Activity Tolerance  Patient limited by fatigue;Patient tolerated treatment well    Behavior During Therapy  [facility] for tasks assessed/performed       Past Medical History:  Diagnosis Date  . Abnormal gait 12/18/2015  . BP (high blood pressure) 08/24/2015  . Cancer ([place])    skin  . Constriction of ureter (postoperative) 07/28/2015  . Hydronephrosis 07/14/2015  . Hypertension   . Left foot drop 12/18/2015  . Recurrent UTI   . UPJ (ureteropelvic junction) obstruction 07/28/2015  . UTI (lower urinary tract infection) 07/14/2015    Past Surgical History:  Procedure Laterality Date  . broken foot  2012  . COLONOSCOPY WITH PROPOFOL N/A 06/30/2017   Procedure: COLONOSCOPY WITH PROPOFOL;  Surgeon: Lollie Sails, MD;  Location: [facility] ENDOSCOPY;  Service: Endoscopy;  Laterality: N/A;  . kidney repair  1985   repaired torn blood vessel in kidney    There were no vitals filed for this visit.  Subjective Assessment - 02/03/19 1100    Subjective  Patient reports trying to go Christmas shopping yesterday and was good for about 30 min but then after 30 min she reports getting tired and frustrated with decreased walking speed.    Pertinent History  68 yo Female reports instability with gait with difficulty walking. She states, "my left leg gives me trouble. It will   just plant and then I can't pick it up." Pt reports 2 falls in last month. She reports when she falls it is often falling backwards; She was diagnosed with hydrocephalus and did a drain trial with no relief; She did not qualify for a shunt. Repeat imaging showed continued hydrocephalus that has not worsened; MRI of lumbar spine shows L5 pars defect with sacral insufficiency; She denies any back pain and reports no numbness in legs. She reports feeling frustration because they can't seem to find what's wrong with her. She does have urinary incontience; reports knowing when she has to go to the bathroom but can't get there fast enough; Patient has a walker and rollator; She uses both; She also has a manual wheelchair that she uses intermittently;     Limitations  Standing;Walking    How long can you sit comfortably?  NA    How long can you stand comfortably?  has pain in LLE with prolonged stooping; <10 min;     How long can you walk comfortably?  200 feet with assistance requiring several breaks;     Diagnostic tests  has had MRI and brain imaging;     Patient Stated Goals  "Be able to travel more, little sight seeing; walk short distances" Patient reports that she would like to be able to get rid of the walker.  Currently in Pain?  No/denies    Pain Onset  Today    Multiple Pain Sites  No            TREATMENT: 10 meter walk at start of session: 0.56 m/s without AD, limited home/community ambulator;   Exercise:  Sit<>stand from chair with arms across chest x10 reps with cues for forward weight shift for better transfer ability and avoid falling backward;    Standing with 3# ankle weights: -hip extension x15 bilaterally; -Hip abduction x15 bilaterally; -hamstring curl x15 bilaterally; Patient required min-moderate verbal/tactile cues for correct exercise technique with cues to avoid trunk rotation and to keep foot oriented forward for better hip strengthening; She requires rail assist for  safety;    Marching unsupported 3# x10 bilaterally with cues to increase hip flexion and improve weight shift for better strengthening. Requires CGA for safety;     NMRE: Standing on airex pad: -Feet close together   eyes open/closed 30 sec hold x1 set each;             BUE wand flexion x10 reps;  Holding wand vertically with rotation side/side x10 reps -feet apart: weight shift side/side with arms by side x10 reps each direction with mod VCs and tactile cues to isolate hip movement and avoid trunk lean;  -alternate toe taps to 4 inch step x10 unsupported with min A to CGA for safety with min VCs for weight shift for better dynamic balance;  Requires min A for safety; PT attempted reducing assistance to allow patient to feel trunk lean for better hip/ankle strategies for righting response;    Ascend/descend 4 steps with 2-1 rail assist forward reciprocally x3 laps with cues for forward weight for better step negotiation and less UE leaning;   Standing on firm surface outside parallel bars: March holding 5 sec with 1 finger tip hold x5 reps each LE  Progressing to SLS 3-5 sec hold without HHA x5 reps each LE; Requires Min A-CGA especially without fingertip hold with tactile cues for weight shift; Patient instructed to increase gluteal activation with less trendelenburg hip for better stance control; With cues she was able to increase hold time with SLS to 5 sec occasionally;    10 meter walk at end of session: 0.66 m/s without AD, limited home/community ambulator, however significantly faster than start of session;      Neuro Re-ed: Patient requires Min A- CGA during all standing interventions due to limited stability and patient fear of LOB. Cueing for reduction of UE support required as well as postural alignment for optimal stability within COM  Response to treatment: Tolerated well. Patient exhibits improved weight shift in stance this session being able to tolerate advanced balance  exercise.                   PT Education - 02/03/19 1055    Education Details  balance/strengthening, HEP    Person(s) Educated  Patient    Methods  Explanation;Verbal cues    Comprehension  Verbalized understanding;Returned demonstration;Verbal cues required;Need further instruction       PT Short Term Goals - 01/25/19 1158      PT SHORT TERM GOAL #1   Title  Patient will be adherent to HEP at least 3x a week to improve functional strength and balance for better safety at home.    Baseline  08/12/18- doing exercise daily;    Time  4    Period  Weeks    Status  Achieved    Target Date  08/17/18      PT SHORT TERM GOAL #2   Title   Patient will deny any falls over past 4 weeks to demonstrate improved safety awareness at home and work.     Baseline  08/12/18- fell 1 week ago; 09/23/18: none recent;    Time  4    Period  Weeks    Status  Achieved    Target Date  08/17/18        PT Long Term Goals - 01/25/19 1158      PT LONG TERM GOAL #1   Title  Patient will increase Berg Balance score by > 6 points to demonstrate decreased fall risk during functional activities.    Time  8    Period  Weeks    Status  Achieved    Target Date  02/10/19      PT LONG TERM GOAL #2   Title  Patient will increase 10 meter walk test to >0.8 m/s as to improve gait speed for better community ambulation and to reduce fall risk.    Baseline  12/16/2018: 0.47 m/s;    Time  8    Period  Weeks    Status  Partially Met    Target Date  02/10/19      PT LONG TERM GOAL #3   Title  Patient will be require no assist with ascend/descend 3 steps using Least restrictive assistive device to improve safety with entry/exit home.     Baseline  12/16/2018: ascends and descends stairs with RUE rail assist.    Time  4    Period  Weeks    Status  Partially Met    Target Date  02/10/19      PT LONG TERM GOAL #4   Title  Patient will increase BLE gross strength to 4+/5 as to improve functional  strength for independent gait, increased standing tolerance and increased ADL ability.    Baseline  12/16/2018: BLE hip ext 3/5, BLE hip add 4-/5, BLE hip abd 4/5;    Time  4    Period  Weeks    Status  Partially Met    Target Date  02/10/19      PT LONG TERM GOAL #5   Title  Patient will increase Berg Balance score to >52/56 to demonstrate decreased fall risk during functional activities.    Baseline  12/16/2018: 48/56;    Time  4    Period  Weeks    Status  Partially Met    Target Date  02/10/19      PT LONG TERM GOAL #6   Title  Patient will be able to stand on one leg for >5 sec to indicate a decreased fall risk and increase ADL/IADL ability.    Baseline  12/16/2018: Unable to hold for more than 3 seconds, but able to remain standing.    Time  4    Period  Weeks    Status  Not Met    Target Date  02/10/19      PT LONG TERM GOAL #7   Title  patient will increase Mini Best test score to >18/28 to exhibit improved balance and reduce fall risk.    Time  4    Period  Weeks    Status  Achieved    Target Date  02/10/19            Plan - 02/03/19 1108    Clinical  Impression Statement  Patient motivated and participated well within session. Instructed patient in advanced LE strengthening exercise with increased resistance and repetition. She did require min Vcs for correct exercise technique/weight shift for optimal muscle activation. Patient instructed in advanced balance exercise. Patient continues to have difficulty standing unsupported but was able to exhibit better weight shift with less left lean. Patient instructed in advanced balance exercise with UE movement and trunk rotation. She does require min A to CGA with most balance exercise. She also requires increased time and cues for motor planning. She would benefit from additional skilled PT Intervention to improve strength, balance and mobility;    Rehab Potential  Good    PT Frequency  2x / week    PT Duration  4 weeks     PT Treatment/Interventions  Cryotherapy;Electrical Stimulation;Moist Heat;Gait training;Stair training;Functional mobility training;Therapeutic activities;Therapeutic exercise;Balance training;Neuromuscular re-education;Patient/family education;Energy conservation    PT Next Visit Plan  Continue/progress ankle strategies and stepping correction, implement ADL training    Consulted and Agree with Plan of Care  Patient       Patient will benefit from skilled therapeutic intervention in order to improve the following deficits and impairments:  Abnormal gait, Decreased balance, Decreased endurance, Decreased mobility, Difficulty walking, Impaired perceived functional ability, Decreased activity tolerance, Decreased safety awareness  Visit Diagnosis: Muscle weakness (generalized)  Unsteadiness on feet  Difficulty in walking, not elsewhere classified     Problem List Patient Active Problem List   Diagnosis Date Noted  . NPH (normal pressure hydrocephalus) ([place]) 07/30/2016  . Left leg weakness 04/20/2016  . Menopausal osteoporosis 03/04/2016  . Abnormal gait 12/18/2015  . Left foot drop 12/18/2015  . BP (high blood pressure) 08/24/2015  . Hydronephrosis, left 07/28/2015  . UPJ (ureteropelvic junction) obstruction 07/28/2015  . Lower urinary tract infectious disease 07/14/2015  . Hydronephrosis 07/14/2015    Kesean Serviss PT, DPT 02/03/2019, 12:08 PM  [place] MAIN [facility] SERVICES [address] [place], [place], [address] Phone: [phone]   Fax:  [phone]  Name: Abigail Hill MRN: [id number] Date of Birth: 04-May-1950

## 2019-02-07 ENCOUNTER — Encounter: Payer: Self-pay | Admitting: Physical Therapy

## 2019-02-07 ENCOUNTER — Ambulatory Visit: Payer: PPO | Admitting: Physical Therapy

## 2019-02-07 ENCOUNTER — Other Ambulatory Visit: Payer: Self-pay

## 2019-02-07 DIAGNOSIS — R269 Unspecified abnormalities of gait and mobility: Secondary | ICD-10-CM

## 2019-02-07 DIAGNOSIS — M6281 Muscle weakness (generalized): Secondary | ICD-10-CM

## 2019-02-07 DIAGNOSIS — R262 Difficulty in walking, not elsewhere classified: Secondary | ICD-10-CM

## 2019-02-07 DIAGNOSIS — R29898 Other symptoms and signs involving the musculoskeletal system: Secondary | ICD-10-CM

## 2019-02-07 DIAGNOSIS — R2681 Unsteadiness on feet: Secondary | ICD-10-CM

## 2019-02-07 NOTE — Therapy (Signed)
[place] MAIN [facility] SERVICES [address] [place], [place], [address] Phone: [phone]   Fax:  [phone]  Physical Therapy Treatment  Patient Details  Name: Abigail Hill MRN: [id number] Date of Birth: [birth year] Referring Provider (PT): Dr. Metta Clines   Encounter Date: 02/07/2019  PT End of Session - 02/07/19 1155    Visit Number  47    Number of Visits  52    Date for PT Re-Evaluation  02/10/19    Authorization Type  goals addressed 12/16/2018    PT Start Time  1150    PT Stop Time  1228    PT Time Calculation (min)  38 min    Equipment Utilized During Treatment  Gait belt    Activity Tolerance  Patient limited by fatigue;Patient tolerated treatment well    Behavior During Therapy  [facility] for tasks assessed/performed       Past Medical History:  Diagnosis Date  . Abnormal gait 12/18/2015  . BP (high blood pressure) 08/24/2015  . Cancer ([place])    skin  . Constriction of ureter (postoperative) 07/28/2015  . Hydronephrosis 07/14/2015  . Hypertension   . Left foot drop 12/18/2015  . Recurrent UTI   . UPJ (ureteropelvic junction) obstruction 07/28/2015  . UTI (lower urinary tract infection) 07/14/2015    Past Surgical History:  Procedure Laterality Date  . broken foot  2012  . COLONOSCOPY WITH PROPOFOL N/A 06/30/2017   Procedure: COLONOSCOPY WITH PROPOFOL;  Surgeon: Lollie Sails, MD;  Location: [facility] ENDOSCOPY;  Service: Endoscopy;  Laterality: N/A;  . kidney repair  1985   repaired torn blood vessel in kidney    There were no vitals filed for this visit.  Subjective Assessment - 02/07/19 1154    Subjective  she is doing beeter but thinks that doing shopping is better on line or in a WC.    Pertinent History  68 yo Female reports instability with gait with difficulty walking. She states, "my left leg gives me trouble. It will  just plant and then I can't pick it up." Pt reports 2 falls in last month. She reports when she falls  it is often falling backwards; She was diagnosed with hydrocephalus and did a drain trial with no relief; She did not qualify for a shunt. Repeat imaging showed continued hydrocephalus that has not worsened; MRI of lumbar spine shows L5 pars defect with sacral insufficiency; She denies any back pain and reports no numbness in legs. She reports feeling frustration because they can't seem to find what's wrong with her. She does have urinary incontience; reports knowing when she has to go to the bathroom but can't get there fast enough; Patient has a walker and rollator; She uses both; She also has a manual wheelchair that she uses intermittently;     Limitations  Standing;Walking    How long can you sit comfortably?  NA    How long can you stand comfortably?  has pain in LLE with prolonged stooping; <10 min;     How long can you walk comfortably?  200 feet with assistance requiring several breaks;     Diagnostic tests  has had MRI and brain imaging;     Patient Stated Goals  "Be able to travel more, little sight seeing; walk short distances" Patient reports that she would like to be able to get rid of the walker.     Currently in Pain?  No/denies    Pain Score  0-No pain    Pain Onset  Today        Treatment: Octane fitness x 5 mins  Neuromuscular treatment: Min use of UE: Standing on foam feet apart, feet together x 1 min each, CGA Standing on foam and tapping step x 10, min assist Standing on floor and tapping step x 10, min assist Stepping over 1/2 foam fwd / bwd x 10, hand hold assist  Tapping 1/2 foam from floor x 10 BLE, cGA 4 square side to side, fwd/bwd,diagonals x 10 , CGA   Pt educated throughout session about proper posture and technique with exercises. Improved exercise technique, movement at target joints, use of target muscles after min to mod verbal, visual, tactile cues.                     PT Education - 02/07/19 1155    Education Details  HEP     Person(s) Educated  Patient    Methods  Explanation;Demonstration;Tactile cues;Verbal cues    Comprehension  Verbalized understanding;Returned demonstration;Verbal cues required;Tactile cues required;Need further instruction       PT Short Term Goals - 01/25/19 1158      PT SHORT TERM GOAL #1   Title  Patient will be adherent to HEP at least 3x a week to improve functional strength and balance for better safety at home.    Baseline  08/12/18- doing exercise daily;    Time  4    Period  Weeks    Status  Achieved    Target Date  08/17/18      PT SHORT TERM GOAL #2   Title   Patient will deny any falls over past 4 weeks to demonstrate improved safety awareness at home and work.     Baseline  08/12/18- fell 1 week ago; 09/23/18: none recent;    Time  4    Period  Weeks    Status  Achieved    Target Date  08/17/18        PT Long Term Goals - 01/25/19 1158      PT LONG TERM GOAL #1   Title  Patient will increase Berg Balance score by > 6 points to demonstrate decreased fall risk during functional activities.    Time  8    Period  Weeks    Status  Achieved    Target Date  02/10/19      PT LONG TERM GOAL #2   Title  Patient will increase 10 meter walk test to >0.8 m/s as to improve gait speed for better community ambulation and to reduce fall risk.    Baseline  12/16/2018: 0.47 m/s;    Time  8    Period  Weeks    Status  Partially Met    Target Date  02/10/19      PT LONG TERM GOAL #3   Title  Patient will be require no assist with ascend/descend 3 steps using Least restrictive assistive device to improve safety with entry/exit home.     Baseline  12/16/2018: ascends and descends stairs with RUE rail assist.    Time  4    Period  Weeks    Status  Partially Met    Target Date  02/10/19      PT LONG TERM GOAL #4   Title  Patient will increase BLE gross strength to 4+/5 as to improve functional strength for independent gait, increased standing tolerance and increased ADL  ability.  Baseline  12/16/2018: BLE hip ext 3/5, BLE hip add 4-/5, BLE hip abd 4/5;    Time  4    Period  Weeks    Status  Partially Met    Target Date  02/10/19      PT LONG TERM GOAL #5   Title  Patient will increase Berg Balance score to >52/56 to demonstrate decreased fall risk during functional activities.    Baseline  12/16/2018: 48/56;    Time  4    Period  Weeks    Status  Partially Met    Target Date  02/10/19      PT LONG TERM GOAL #6   Title  Patient will be able to stand on one leg for >5 sec to indicate a decreased fall risk and increase ADL/IADL ability.    Baseline  12/16/2018: Unable to hold for more than 3 seconds, but able to remain standing.    Time  4    Period  Weeks    Status  Not Met    Target Date  02/10/19      PT LONG TERM GOAL #7   Title  patient will increase Mini Best test score to >18/28 to exhibit improved balance and reduce fall risk.    Time  4    Period  Weeks    Status  Achieved    Target Date  02/10/19            Plan - 02/07/19 1156    Clinical Impression Statement  Patient instructed in beginning balance and coordination exercise. Patient required mod VCs and min A for balance training to improve weight shift and postural control. Patient requires min VCs to improve ankle stability with gait.  Patients would benefit from additional skilled PT intervention to improve balance/gait safety and reduce fall risk.   Rehab Potential  Good    PT Frequency  2x / week    PT Duration  4 weeks    PT Treatment/Interventions  Cryotherapy;Electrical Stimulation;Moist Heat;Gait training;Stair training;Functional mobility training;Therapeutic activities;Therapeutic exercise;Balance training;Neuromuscular re-education;Patient/family education;Energy conservation    PT Next Visit Plan  Continue/progress ankle strategies and stepping correction, implement ADL training    Consulted and Agree with Plan of Care  Patient       Patient will benefit from  skilled therapeutic intervention in order to improve the following deficits and impairments:  Abnormal gait, Decreased balance, Decreased endurance, Decreased mobility, Difficulty walking, Impaired perceived functional ability, Decreased activity tolerance, Decreased safety awareness  Visit Diagnosis: Muscle weakness (generalized)  Difficulty in walking, not elsewhere classified  Unsteadiness on feet  Abnormal gait  Left leg weakness     Problem List Patient Active Problem List   Diagnosis Date Noted  . NPH (normal pressure hydrocephalus) ([place]) 07/30/2016  . Left leg weakness 04/20/2016  . Menopausal osteoporosis 03/04/2016  . Abnormal gait 12/18/2015  . Left foot drop 12/18/2015  . BP (high blood pressure) 08/24/2015  . Hydronephrosis, left 07/28/2015  . UPJ (ureteropelvic junction) obstruction 07/28/2015  . Lower urinary tract infectious disease 07/14/2015  . Hydronephrosis 07/14/2015    Alanson Puls, PT DPT 02/07/2019, 11:56 AM  [place] MAIN [facility] SERVICES [address] [place], [place], [address] Phone: [phone]   Fax:  [phone]  Name: Abigail Hill MRN: [id number] Date of Birth: 10-23-1950

## 2019-02-09 ENCOUNTER — Other Ambulatory Visit: Payer: Self-pay

## 2019-02-09 ENCOUNTER — Ambulatory Visit: Payer: PPO | Admitting: Physical Therapy

## 2019-02-09 ENCOUNTER — Encounter: Payer: Self-pay | Admitting: Physical Therapy

## 2019-02-09 DIAGNOSIS — M6281 Muscle weakness (generalized): Secondary | ICD-10-CM | POA: Diagnosis not present

## 2019-02-09 DIAGNOSIS — R2681 Unsteadiness on feet: Secondary | ICD-10-CM

## 2019-02-09 DIAGNOSIS — R262 Difficulty in walking, not elsewhere classified: Secondary | ICD-10-CM

## 2019-02-09 NOTE — Therapy (Signed)
[place] MAIN [facility] SERVICES [address] [place], [place], [address] Phone: [phone]   Fax:  [phone]  Physical Therapy Treatment  Patient Details  Name: Abigail Hill MRN: [id number] Date of Birth: 1950/08/25 Referring Provider (Hill): Dr. Metta Clines   Encounter Date: 02/09/2019  Hill End of Session - 02/09/19 1145    Visit Number  57    Number of Visits  59    Date for Hill Re-Evaluation  02/10/19    Authorization Type  goals addressed 12/16/2018    Hill Start Time  1147    Hill Stop Time  1230    Hill Time Calculation (min)  43 min    Equipment Utilized During Treatment  Gait belt    Activity Tolerance  Patient limited by fatigue;Patient tolerated treatment well    Behavior During Therapy  [facility] for tasks assessed/performed       Past Medical History:  Diagnosis Date  . Abnormal gait 12/18/2015  . BP (high blood pressure) 08/24/2015  . Cancer ([place])    skin  . Constriction of ureter (postoperative) 07/28/2015  . Hydronephrosis 07/14/2015  . Hypertension   . Left foot drop 12/18/2015  . Recurrent UTI   . UPJ (ureteropelvic junction) obstruction 07/28/2015  . UTI (lower urinary tract infection) 07/14/2015    Past Surgical History:  Procedure Laterality Date  . broken foot  2012  . COLONOSCOPY WITH PROPOFOL N/A 06/30/2017   Procedure: COLONOSCOPY WITH PROPOFOL;  Surgeon: Lollie Sails, MD;  Location: [facility] ENDOSCOPY;  Service: Endoscopy;  Laterality: N/A;  . kidney repair  1985   repaired torn blood vessel in kidney    There were no vitals filed for this visit.  Subjective Assessment - 02/09/19 1151    Subjective  patient reports doing well. Denies any new falls. Reports minimal soreness. She does report some stiffness today;    Pertinent History  68 yo Female reports instability with gait with difficulty walking. She states, "my left leg gives me trouble. It will  just plant and then I can't pick it up." Hill reports 2 falls in last  month. She reports when she falls it is often falling backwards; She was diagnosed with hydrocephalus and did a drain trial with no relief; She did not qualify for a shunt. Repeat imaging showed continued hydrocephalus that has not worsened; MRI of lumbar spine shows L5 pars defect with sacral insufficiency; She denies any back pain and reports no numbness in legs. She reports feeling frustration because they can't seem to find what's wrong with her. She does have urinary incontience; reports knowing when she has to go to the bathroom but can't get there fast enough; Patient has a walker and rollator; She uses both; She also has a manual wheelchair that she uses intermittently;     Limitations  Standing;Walking    How long can you sit comfortably?  NA    How long can you stand comfortably?  has pain in LLE with prolonged stooping; <10 min;     How long can you walk comfortably?  200 feet with assistance requiring several breaks;     Diagnostic tests  has had MRI and brain imaging;     Patient Stated Goals  "Be able to travel more, little sight seeing; walk short distances" Patient reports that she would like to be able to get rid of the walker.     Currently in Pain?  No/denies    Pain Onset  Today    Multiple Pain Sites  No         [facility] Hill Assessment - 02/09/19 0001      6 minute walk test results    Aerobic Endurance Distance Walked  375    Endurance additional comments  without AD, CGA for safety, less than community ambulator of 1000 feet, high fall risk      Standardized Balance Assessment   10 Meter Walk  0.62 m/s without AD at start of session, 0.76 m/s without AD at end of session      TREATMENT: Warm up on Nustep BUE/BLE level 2 x4 min (unbilled);  Patient instructed in 10 meter walk, etc to address goals. See above;  Standing on firm surfaceoutside parallel bars: March holding5sec with 1 finger tip holdx5 reps each LE, 10 sec hold each;  Progressing toSLS3-5sec hold  without HHA x5 reps each LE; RequiresMin A-CGA especially without fingertip hold with tactile cues for weight shift;Patient instructed to increase gluteal activation with less trendelenburg hip for better stance control; With cues she was able to increase hold time with SLS to 5 sec occasionally;  Standing on firm surface: -feet apart, lateral weight shift unsupported x10 reps; -toe taps to 8 inch step unsupported x10 reps each LE -LLE hip flexion march, then extending knee and landing with ankle DF to simulate swing phase of gait x10 reps with min A for safety, rail assist progressing to 0 rail assist;  Neuro Re-ed: Patient requiresMin A-CGA during all standing interventions due to limited stability and patient fear of LOB. Cueing for reduction of UE support required as well as postural alignment for optimal stability within COM Response to treatment: Tolerated well. Patient exhibits improved weight shift in stance this session being able to tolerate advanced balance exercise.                        Hill Education - 02/09/19 1145    Education Details  LE strength, balance, HEP    Person(s) Educated  Patient    Methods  Explanation;Verbal cues    Comprehension  Verbalized understanding;Returned demonstration;Verbal cues required;Need further instruction       Hill Short Term Goals - 02/09/19 1154      Hill SHORT TERM GOAL #1   Title  Patient will be adherent to HEP at least 3x a week to improve functional strength and balance for better safety at home.    Baseline  08/12/18- doing exercise daily; 02/09/19: doing them 3-4 times a week;    Time  4    Period  Weeks    Status  Achieved    Target Date  08/17/18      Hill SHORT TERM GOAL #2   Title   Patient will deny any falls over past 4 weeks to demonstrate improved safety awareness at home and work.     Baseline  08/12/18- fell 1 week ago; 09/23/18: none recent;    Time  4    Period  Weeks    Status  Achieved     Target Date  08/17/18        Hill Long Term Goals - 02/09/19 1154      Hill LONG TERM GOAL #1   Title  Patient will increase Berg Balance score by > 6 points to demonstrate decreased fall risk during functional activities.    Time  8    Period  Weeks    Status  Achieved  Target Date  03/09/19      Hill LONG TERM GOAL #2   Title  Patient will increase 10 meter walk test to >0.8 m/s as to improve gait speed for better community ambulation and to reduce fall risk.    Baseline  12/16/2018: 0.47 m/s; 02/09/19: 0.76 m/s    Time  4    Period  Weeks    Status  Partially Met    Target Date  03/09/19      Hill LONG TERM GOAL #3   Title  Patient will be require no assist with ascend/descend 3 steps using Least restrictive assistive device to improve safety with entry/exit home.     Baseline  12/16/2018: ascends and descends stairs with RUE rail assist.    Time  4    Period  Weeks    Status  Partially Met    Target Date  03/09/19      Hill LONG TERM GOAL #4   Title  Patient will increase BLE gross strength to 4+/5 as to improve functional strength for independent gait, increased standing tolerance and increased ADL ability.    Baseline  12/16/2018: BLE hip ext 3/5, BLE hip add 4-/5, BLE hip abd 4/5;    Time  4    Period  Weeks    Status  Partially Met    Target Date  03/09/19      Hill LONG TERM GOAL #5   Title  Patient will increase Berg Balance score to >52/56 to demonstrate decreased fall risk during functional activities.    Baseline  12/16/2018: 48/56;    Time  4    Period  Weeks    Status  Deferred    Target Date  03/09/19      Additional Long Term Goals   Additional Long Term Goals  Yes      Hill LONG TERM GOAL #6   Title  Patient will be able to stand on one leg for >5 sec to indicate a decreased fall risk and increase ADL/IADL ability.    Baseline  12/16/2018: Unable to hold for more than 3 seconds, but able to remain standing. 02/09/19: 3-5 sec each LE    Time  4    Period  Weeks     Status  Partially Met    Target Date  03/09/19      Hill LONG TERM GOAL #7   Title  patient will increase Mini Best test score to >18/28 to exhibit improved balance and reduce fall risk.    Time  4    Period  Weeks    Status  Achieved    Target Date  03/09/19      Hill LONG TERM GOAL #8   Title  Patient will improve cardiovascular endurance as evidenced by increasing 6 min walk test to > 450 feet to exhibit a significant improvement in gait ability for shopping and running errands.    Baseline  12/9: 365 feet    Time  4    Period  Weeks    Status  New    Target Date  03/09/19            Plan - 02/09/19 1402    Clinical Impression Statement  Patient motivated and participated well within session. She does exhibit a significant improvement in gait speed which is seen following balance/weight shifting activities. Patient continues to require mod VCs with  most exercises for better motor planning and dynamic balance control. She does  fatigue quickly with longer distance walking often reverting back to short shuffled steps with fatigue. She would benefit from additional skilled Hill Intervention to improve strength, balance and gait safety;    Rehab Potential  Good    Hill Frequency  2x / week    Hill Duration  4 weeks    Hill Treatment/Interventions  Cryotherapy;Electrical Stimulation;Moist Heat;Gait training;Stair training;Functional mobility training;Therapeutic activities;Therapeutic exercise;Balance training;Neuromuscular re-education;Patient/family education;Energy conservation    Hill Next Visit Plan  Continue/progress ankle strategies and stepping correction, implement ADL training    Consulted and Agree with Plan of Care  Patient       Patient will benefit from skilled therapeutic intervention in order to improve the following deficits and impairments:  Abnormal gait, Decreased balance, Decreased endurance, Decreased mobility, Difficulty walking, Impaired perceived functional ability,  Decreased activity tolerance, Decreased safety awareness  Visit Diagnosis: Muscle weakness (generalized)  Difficulty in walking, not elsewhere classified  Unsteadiness on feet     Problem List Patient Active Problem List   Diagnosis Date Noted  . NPH (normal pressure hydrocephalus) ([place]) 07/30/2016  . Left leg weakness 04/20/2016  . Menopausal osteoporosis 03/04/2016  . Abnormal gait 12/18/2015  . Left foot drop 12/18/2015  . BP (high blood pressure) 08/24/2015  . Hydronephrosis, left 07/28/2015  . UPJ (ureteropelvic junction) obstruction 07/28/2015  . Lower urinary tract infectious disease 07/14/2015  . Hydronephrosis 07/14/2015    Trotter,Abigail Hill, Abigail Hill 02/09/2019, 2:09 PM  [place] MAIN [facility] SERVICES [address] [place], [place], [address] Phone: [phone]   Fax:  [phone]  Name: Abigail Hill MRN: [id number] Date of Birth: 23-Jun-1950

## 2019-02-14 ENCOUNTER — Ambulatory Visit: Payer: PPO | Admitting: Physical Therapy

## 2019-02-16 ENCOUNTER — Ambulatory Visit: Payer: PPO | Admitting: Physical Therapy

## 2019-02-16 ENCOUNTER — Other Ambulatory Visit: Payer: Self-pay

## 2019-02-18 ENCOUNTER — Other Ambulatory Visit: Payer: Self-pay | Admitting: Urology

## 2019-02-22 ENCOUNTER — Ambulatory Visit: Payer: PPO | Admitting: Physical Therapy

## 2019-02-23 ENCOUNTER — Telehealth: Payer: Self-pay | Admitting: Neurology

## 2019-02-23 NOTE — Telephone Encounter (Signed)
She is very frustrated  She tries to take a step and she freezes. I asked if she is afraid to take a step or if she is in pain. Husband responded with he believes it is as much mental as it is physical. Pt says that legs do hurt. Tylenol for pain.   Will stand for minutes and just look around.   Cancelled last two PT appts because she feels uncomfortable going. They go to hospital PT.  Sleeps a lot during the day but still sleeps well at night.  Pt has fallen twice this week. Pt has not hit head. Just buttock area.  Not confused per husband. She just doesn't understand while her legs do not work.  No signs of infection per husband.

## 2019-02-23 NOTE — Telephone Encounter (Signed)
I am not really sure what is wrong, which is why I referred her to the [facility] at [facility] months ago.  Did they ever follow up?

## 2019-02-23 NOTE — Telephone Encounter (Signed)
Patient's spouse called requesting to speak with a nurse. He said his wife is barely moving and had to cancel her last two PT appointments because she cannot get to the car. He'd like some advice about how best to care for the patient.

## 2019-02-23 NOTE — Telephone Encounter (Signed)
Per husband they did not follow up. [facility] cancelled due to [place] and never r/s.  Spoke with scheduler at [facility] at [phone]. They did cancel on 06/05/2018 due to covid but r/s pt on 08/19/18. Pt no showed for the appt. Husband states that he was unaware of that appt. The scheduler stated that the pt would need to call and schedule another appt that I could not make one for her. Husband informed and number given. He will follow up with the [facility].

## 2019-02-24 ENCOUNTER — Ambulatory Visit: Payer: PPO | Admitting: Physical Therapy

## 2019-03-01 ENCOUNTER — Ambulatory Visit: Payer: PPO | Admitting: Physical Therapy

## 2019-03-03 ENCOUNTER — Ambulatory Visit: Payer: PPO | Admitting: Physical Therapy

## 2019-03-08 ENCOUNTER — Ambulatory Visit: Payer: PPO | Attending: Neurology

## 2019-03-08 ENCOUNTER — Other Ambulatory Visit: Payer: Self-pay

## 2019-03-08 DIAGNOSIS — R29898 Other symptoms and signs involving the musculoskeletal system: Secondary | ICD-10-CM | POA: Diagnosis not present

## 2019-03-08 DIAGNOSIS — R262 Difficulty in walking, not elsewhere classified: Secondary | ICD-10-CM | POA: Insufficient documentation

## 2019-03-08 DIAGNOSIS — R269 Unspecified abnormalities of gait and mobility: Secondary | ICD-10-CM | POA: Insufficient documentation

## 2019-03-08 DIAGNOSIS — R2681 Unsteadiness on feet: Secondary | ICD-10-CM | POA: Diagnosis not present

## 2019-03-08 DIAGNOSIS — M6281 Muscle weakness (generalized): Secondary | ICD-10-CM | POA: Insufficient documentation

## 2019-03-08 NOTE — Therapy (Signed)
[place] MAIN [facility] SERVICES [address] [place], [place], [address] Phone: [phone]   Fax:  [phone]  Physical Therapy Treatment  Patient Details  Name: Abigail Hill MRN: [id number] Date of Birth: 1950-08-05 Referring Provider (PT): Dr. Metta Clines   Encounter Date: 03/08/2019  PT End of Session - 03/08/19 1212    Visit Number  55    Number of Visits  70    Date for PT Re-Evaluation  03/08/18    Authorization Time Period  03/08/19-05/03/19    Authorization - Visit Number  1    Authorization - Number of Visits  10    PT Start Time  2952    PT Stop Time  1229    PT Time Calculation (min)  40 min    Equipment Utilized During Treatment  Gait belt    Activity Tolerance  Patient tolerated treatment well;No increased pain;Patient limited by fatigue    Behavior During Therapy  [facility] for tasks assessed/performed       Past Medical History:  Diagnosis Date  . Abnormal gait 12/18/2015  . BP (high blood pressure) 08/24/2015  . Cancer ([place])    skin  . Constriction of ureter (postoperative) 07/28/2015  . Hydronephrosis 07/14/2015  . Hypertension   . Left foot drop 12/18/2015  . Recurrent UTI   . UPJ (ureteropelvic junction) obstruction 07/28/2015  . UTI (lower urinary tract infection) 07/14/2015    Past Surgical History:  Procedure Laterality Date  . broken foot  2012  . COLONOSCOPY WITH PROPOFOL N/A 06/30/2017   Procedure: COLONOSCOPY WITH PROPOFOL;  Surgeon: Lollie Sails, MD;  Location: [facility] ENDOSCOPY;  Service: Endoscopy;  Laterality: N/A;  . kidney repair  1985   repaired torn blood vessel in kidney    There were no vitals filed for this visit.  Subjective Assessment - 03/08/19 1211    Subjective  Since last visit noted trouble moving left leg. Has fallen twice since then, new onset trouble with getting into her high bed, since lowered her box spring to the floor. She has been sore from her falls. Pt is concerned about 3 weeks of  malaise and continued difficulty with LLE. Pt reports her doctor has recently recommended she consider going to the [facility] at [facility].    Pertinent History  69 yo Female reports instability with gait with difficulty walking. She states, "my left leg gives me trouble. It will  just plant and then I can't pick it up." Pt reports 2 falls in last month. She reports when she falls it is often falling backwards; She was diagnosed with hydrocephalus and did a drain trial with no relief; She did not qualify for a shunt. Repeat imaging showed continued hydrocephalus that has not worsened; MRI of lumbar spine shows L5 pars defect with sacral insufficiency; She denies any back pain and reports no numbness in legs. She reports feeling frustration because they can't seem to find what's wrong with her. She does have urinary incontience; reports knowing when she has to go to the bathroom but can't get there fast enough; Patient has a walker and rollator; She uses both; She also has a manual wheelchair that she uses intermittently;     Currently in Pain?  No/denies         INTERVENTION THIS DATE: -AMB c 4WW 228f, Left foot drag, Left scuff, cues to amplify hip flexion and heel strike with short sustained correction -seated Left hip flexion 2x15 -seated  Left LAQ 2x15 c 4lb AW -standing left knee flexion 4lb AW, surprisingly difficult  -Left ankle dorsiflexion 5lb AW, 2x15  MMT:  -Hip flexion 5/5 left, 4+/5 Left -hip horizontal ABDCT: 5/5 bilat -hip Adduction (seated) 5/5 Right, 4/5 Left  -knee extension 5/5 bilat -knee flexion 5/5 Right, 4+/5 Left  -ankle DF/PF 5/5 equal bilat   Sensation intact, tactile localization intact.      PT Short Term Goals - 02/09/19 1154      PT SHORT TERM GOAL #1   Title  Patient will be adherent to HEP at least 3x a week to improve functional strength and balance for better safety at home.    Baseline  08/12/18- doing exercise daily; 02/09/19: doing them 3-4  times a week;    Time  4    Period  Weeks    Status  Achieved    Target Date  08/17/18      PT SHORT TERM GOAL #2   Title   Patient will deny any falls over past 4 weeks to demonstrate improved safety awareness at home and work.     Baseline  08/12/18- fell 1 week ago; 09/23/18: none recent;    Time  4    Period  Weeks    Status  Achieved    Target Date  08/17/18        PT Long Term Goals - 03/08/19 1242      PT LONG TERM GOAL #1   Title  Patient will increase Berg Balance score by > 6 points to demonstrate decreased fall risk during functional activities.    Time  8    Period  Weeks    Status  Achieved    Target Date  03/09/19      PT LONG TERM GOAL #2   Title  Patient will increase 10 meter walk test to >0.8 m/s as to improve gait speed for better community ambulation and to reduce fall risk.    Baseline  12/16/2018: 0.47 m/s; 02/09/19: 0.76 m/s    Time  4    Period  Weeks    Status  On-going    Target Date  03/09/19      PT LONG TERM GOAL #3   Title  Patient will be require no assist with ascend/descend 3 steps using Least restrictive assistive device to improve safety with entry/exit home.     Baseline  12/16/2018: ascends and descends stairs with RUE rail assist.    Time  4    Period  Weeks    Status  Partially Met    Target Date  03/09/19      PT LONG TERM GOAL #4   Title  Patient will increase BLE gross strength to 4+/5 as to improve functional strength for independent gait, increased standing tolerance and increased ADL ability.    Baseline  12/16/2018: BLE hip ext 3/5, BLE hip add 4-/5, BLE hip abd 4/5; On 1/5 all groups 4+/5 or stronger excepts ABDCT adn extension not assessed.    Time  4    Period  Weeks    Status  Partially Met    Target Date  05/03/19      PT LONG TERM GOAL #5   Title  Patient will increase Berg Balance score to >52/56 to demonstrate decreased fall risk during functional activities.    Baseline  12/16/2018: 48/56;    Time  4    Period   Weeks    Status  On-going  Target Date  05/03/19      PT LONG TERM GOAL #6   Title  Patient will be able to stand on one leg for >5 sec to indicate a decreased fall risk and increase ADL/IADL ability.    Baseline  12/16/2018: Unable to hold for more than 3 seconds, but able to remain standing. 02/09/19: 3-5 sec each LE    Time  4    Period  Weeks    Status  On-going      PT LONG TERM GOAL #7   Title  patient will increase Mini Best test score to >18/28 to exhibit improved balance and reduce fall risk.    Time  4    Period  Weeks    Status  Achieved    Target Date  03/09/19      PT LONG TERM GOAL #8   Title  Patient will improve cardiovascular endurance as evidenced by increasing 6 min walk test to > 450 feet to exhibit a significant improvement in gait ability for shopping and running errands.    Baseline  12/9: 365 feet; 1/5 AMB c 4WW with quick onset fatigue of LLE and loss heelstrike    Time  4    Period  Weeks    Status  On-going    Target Date  03/09/19            Plan - 03/08/19 1214    Clinical Impression Statement  Pt returns this date after a 3 week hiatus. She has been under the weather, has had severla falls, generally feels to have declined in strength globally, but also with LLE. Pt is reassesed this date. MMT reveals only trace weakness about the LLE, which only mildly speak to ambulatory disfunction. Pt has progress difficulty with LLE with AMB, having to exaggerate hip flexion and heel strike to achieve symmetrical gait mechnaics, however she is not able to sustain these changes. Most longterm goals to be extended as patient ha smissed many appointments over the past several weeks.    Personal Factors and Comorbidities  Age;Time since onset of injury/illness/exacerbation;Comorbidity 3+    Comorbidities  HTN (controlled), hydrocephalus somewhat controlled, L5 pars defect;     Examination-Activity Limitations  Bend;Caring for  Others;Carry;Continence;Dressing;Lift;Locomotion Level;Stairs;Stand;Transfers    Examination-Participation Restrictions  Cleaning;Community Activity;Driving;Laundry;Meal Prep;Shop;Yard Work    Stability/Clinical Decision Making  Evolving/Moderate complexity    Clinical Decision Making  Moderate    Rehab Potential  Good    PT Frequency  2x / week    PT Duration  4 weeks    PT Treatment/Interventions  Cryotherapy;Electrical Stimulation;Moist Heat;Gait training;Stair training;Functional mobility training;Therapeutic activities;Therapeutic exercise;Balance training;Neuromuscular re-education;Patient/family education;Energy conservation    PT Next Visit Plan  More in depth hip strength assessment.    PT Home Exercise Plan  Needs an update, but none this session.    Consulted and Agree with Plan of Care  Patient       Patient will benefit from skilled therapeutic intervention in order to improve the following deficits and impairments:  Abnormal gait, Decreased balance, Decreased endurance, Decreased mobility, Difficulty walking, Impaired perceived functional ability, Decreased activity tolerance, Decreased safety awareness  Visit Diagnosis: Muscle weakness (generalized)  Difficulty in walking, not elsewhere classified  Unsteadiness on feet  Abnormal gait  Left leg weakness     Problem List Patient Active Problem List   Diagnosis Date Noted  . NPH (normal pressure hydrocephalus) ([place]) 07/30/2016  . Left leg weakness 04/20/2016  . Menopausal osteoporosis 03/04/2016  .  Abnormal gait 12/18/2015  . Left foot drop 12/18/2015  . BP (high blood pressure) 08/24/2015  . Hydronephrosis, left 07/28/2015  . UPJ (ureteropelvic junction) obstruction 07/28/2015  . Lower urinary tract infectious disease 07/14/2015  . Hydronephrosis 07/14/2015   12:52 PM, 03/08/19 Etta Grandchild, PT, DPT Physical Therapist - [facility]  Outpatient Physical Therapy- [place] [facility] 03/08/2019, 12:51 PM  [place] MAIN [facility] SERVICES [address] [place], [place], [address] Phone: [phone]   Fax:  [phone]  Name: Abigail Hill MRN: [id number] Date of Birth: 11-Feb-1951

## 2019-03-10 ENCOUNTER — Ambulatory Visit: Payer: PPO

## 2019-03-10 ENCOUNTER — Other Ambulatory Visit: Payer: Self-pay

## 2019-03-10 DIAGNOSIS — M6281 Muscle weakness (generalized): Secondary | ICD-10-CM | POA: Diagnosis not present

## 2019-03-10 DIAGNOSIS — R262 Difficulty in walking, not elsewhere classified: Secondary | ICD-10-CM

## 2019-03-10 DIAGNOSIS — R29898 Other symptoms and signs involving the musculoskeletal system: Secondary | ICD-10-CM

## 2019-03-10 DIAGNOSIS — R2681 Unsteadiness on feet: Secondary | ICD-10-CM

## 2019-03-10 DIAGNOSIS — R269 Unspecified abnormalities of gait and mobility: Secondary | ICD-10-CM

## 2019-03-10 NOTE — Therapy (Signed)
[place] MAIN [facility] SERVICES [address] [place], [place], [address] Phone: [phone]   Fax:  [phone]  Physical Therapy Treatment  Patient Details  Name: Abigail Hill MRN: [id number] Date of Birth: 1950-10-21 Referring Provider (PT): Dr. Metta Clines   Encounter Date: 03/10/2019  PT End of Session - 03/10/19 1158    Visit Number  53    Number of Visits  64    Date for PT Re-Evaluation  05/03/19    Authorization Type  goals addressed 12/16/2018    Authorization Time Period  03/08/19-05/03/19    PT Start Time  1146    PT Stop Time  1226    PT Time Calculation (min)  40 min    Equipment Utilized During Treatment  Gait belt    Activity Tolerance  Patient tolerated treatment well;No increased pain;Patient limited by fatigue    Behavior During Therapy  [facility] for tasks assessed/performed       Past Medical History:  Diagnosis Date  . Abnormal gait 12/18/2015  . BP (high blood pressure) 08/24/2015  . Cancer ([place])    skin  . Constriction of ureter (postoperative) 07/28/2015  . Hydronephrosis 07/14/2015  . Hypertension   . Left foot drop 12/18/2015  . Recurrent UTI   . UPJ (ureteropelvic junction) obstruction 07/28/2015  . UTI (lower urinary tract infection) 07/14/2015    Past Surgical History:  Procedure Laterality Date  . broken foot  2012  . COLONOSCOPY WITH PROPOFOL N/A 06/30/2017   Procedure: COLONOSCOPY WITH PROPOFOL;  Surgeon: Lollie Sails, MD;  Location: [facility] ENDOSCOPY;  Service: Endoscopy;  Laterality: N/A;  . kidney repair  1985   repaired torn blood vessel in kidney    There were no vitals filed for this visit.  Subjective Assessment - 03/10/19 1156    Subjective  Pt doing good otday. No soreness after last session. Reports some continued back pain from falls last month.    Pertinent History  69 yo Female reports instability with gait with difficulty walking. She states, "my left leg gives me trouble. It will  just plant  and then I can't pick it up." Pt reports 2 falls in last month. She reports when she falls it is often falling backwards; She was diagnosed with hydrocephalus and did a drain trial with no relief; She did not qualify for a shunt. Repeat imaging showed continued hydrocephalus that has not worsened; MRI of lumbar spine shows L5 pars defect with sacral insufficiency; She denies any back pain and reports no numbness in legs. She reports feeling frustration because they can't seem to find what's wrong with her. She does have urinary incontience; reports knowing when she has to go to the bathroom but can't get there fast enough; Patient has a walker and rollator; She uses both; She also has a manual wheelchair that she uses intermittently;     Currently in Pain?  Yes    Pain Score  2     Pain Location  --   low back pain from falls last month      INTERVENTION THIS DATE: -overground AMB, gait training with 4WW 2x259f, min-guard assist to supervision level, extensive cues for upright posture, hip flexion, heel strike, and shuffle avoidance. (~4 minutes, 0.353m) -glute max bridge 2x15, cues for most ROM available (wide stance and end range knee flexion)  -Leg press hip/knee extension 1x15 @ 120lbs BLE; single leg/narrow stance 1x15 @ 75lb; 1x10_0  -seated hip flexion/marching, 5lb AW 2x10  bilat (LLE subjectively more difficult) -LAQ 1x15 c 5lb AW  -wide stance lateral weight shifting with gradual transition to foot lift off x4 minutes; extensive verbal and visual cues given to teach this activeiy. Pt set up for safety.   *pt educated about new increase in intensity, hence told that HEP would not yet be updated until PT could determine good tolerance to interventions.      PT Short Term Goals - 02/09/19 1154      PT SHORT TERM GOAL #1   Title  Patient will be adherent to HEP at least 3x a week to improve functional strength and balance for better safety at home.    Baseline  08/12/18- doing  exercise daily; 02/09/19: doing them 3-4 times a week;    Time  4    Period  Weeks    Status  Achieved    Target Date  08/17/18      PT SHORT TERM GOAL #2   Title   Patient will deny any falls over past 4 weeks to demonstrate improved safety awareness at home and work.     Baseline  08/12/18- fell 1 week ago; 09/23/18: none recent;    Time  4    Period  Weeks    Status  Achieved    Target Date  08/17/18        PT Long Term Goals - 03/08/19 1242      PT LONG TERM GOAL #1   Title  Patient will increase Berg Balance score by > 6 points to demonstrate decreased fall risk during functional activities.    Time  8    Period  Weeks    Status  Achieved    Target Date  03/09/19      PT LONG TERM GOAL #2   Title  Patient will increase 10 meter walk test to >0.8 m/s as to improve gait speed for better community ambulation and to reduce fall risk.    Baseline  12/16/2018: 0.47 m/s; 02/09/19: 0.76 m/s    Time  4    Period  Weeks    Status  On-going    Target Date  03/09/19      PT LONG TERM GOAL #3   Title  Patient will be require no assist with ascend/descend 3 steps using Least restrictive assistive device to improve safety with entry/exit home.     Baseline  12/16/2018: ascends and descends stairs with RUE rail assist.    Time  4    Period  Weeks    Status  Partially Met    Target Date  03/09/19      PT LONG TERM GOAL #4   Title  Patient will increase BLE gross strength to 4+/5 as to improve functional strength for independent gait, increased standing tolerance and increased ADL ability.    Baseline  12/16/2018: BLE hip ext 3/5, BLE hip add 4-/5, BLE hip abd 4/5; On 1/5 all groups 4+/5 or stronger excepts ABDCT adn extension not assessed.    Time  4    Period  Weeks    Status  Partially Met    Target Date  05/03/19      PT LONG TERM GOAL #5   Title  Patient will increase Berg Balance score to >52/56 to demonstrate decreased fall risk during functional activities.    Baseline   12/16/2018: 48/56;    Time  4    Period  Weeks    Status  On-going  Target Date  05/03/19      PT LONG TERM GOAL #6   Title  Patient will be able to stand on one leg for >5 sec to indicate a decreased fall risk and increase ADL/IADL ability.    Baseline  12/16/2018: Unable to hold for more than 3 seconds, but able to remain standing. 02/09/19: 3-5 sec each LE    Time  4    Period  Weeks    Status  On-going      PT LONG TERM GOAL #7   Title  patient will increase Mini Best test score to >18/28 to exhibit improved balance and reduce fall risk.    Time  4    Period  Weeks    Status  Achieved    Target Date  03/09/19      PT LONG TERM GOAL #8   Title  Patient will improve cardiovascular endurance as evidenced by increasing 6 min walk test to > 450 feet to exhibit a significant improvement in gait ability for shopping and running errands.    Baseline  12/9: 365 feet; 1/5 AMB c 4WW with quick onset fatigue of LLE and loss heelstrike    Time  4    Period  Weeks    Status  On-going    Target Date  03/09/19            Plan - 03/10/19 1159    Clinical Impression Statement  Continued with gait training, LE strengthening. Pt continues to struggle with bilat scuffing shuffle, but does well for shor tperiods when cued for bigger steps and exaggerated hip flexion in gait. Pt also noted to have large functional limitatiosn from hip extensor weakness bilat which is targetted with intervention this date. Pt remains heavily motivated and focused.    Rehab Potential  Good    PT Frequency  2x / week    PT Duration  8 weeks    PT Treatment/Interventions  Cryotherapy;Electrical Stimulation;Moist Heat;Gait training;Stair training;Functional mobility training;Therapeutic activities;Therapeutic exercise;Balance training;Neuromuscular re-education;Patient/family education;Energy conservation    PT Next Visit Plan  progress isolated hip strengthening regimine and gai ttraining.    PT Home Exercise  Plan  Needs an update, but none this session.    Consulted and Agree with Plan of Care  Patient       Patient will benefit from skilled therapeutic intervention in order to improve the following deficits and impairments:  Abnormal gait, Decreased balance, Decreased endurance, Decreased mobility, Difficulty walking, Impaired perceived functional ability, Decreased activity tolerance, Decreased safety awareness  Visit Diagnosis: Muscle weakness (generalized)  Difficulty in walking, not elsewhere classified  Unsteadiness on feet  Abnormal gait  Left leg weakness     Problem List Patient Active Problem List   Diagnosis Date Noted  . NPH (normal pressure hydrocephalus) ([place]) 07/30/2016  . Left leg weakness 04/20/2016  . Menopausal osteoporosis 03/04/2016  . Abnormal gait 12/18/2015  . Left foot drop 12/18/2015  . BP (high blood pressure) 08/24/2015  . Hydronephrosis, left 07/28/2015  . UPJ (ureteropelvic junction) obstruction 07/28/2015  . Lower urinary tract infectious disease 07/14/2015  . Hydronephrosis 07/14/2015   12:11 PM, 03/10/19 Etta Grandchild, PT, DPT Physical Therapist - [facility]  Outpatient Physical Therapy- [place] [facility] 03/10/2019, 12:02 PM  [place] MAIN [facility] SERVICES [address] [place], [place], [address] Phone: [phone]   Fax:  [phone]  Name: Abigail Collings  Hill MRN: [id number] Date of Birth: 07-17-1950

## 2019-03-15 ENCOUNTER — Ambulatory Visit: Payer: PPO | Admitting: Physical Therapy

## 2019-03-15 ENCOUNTER — Other Ambulatory Visit: Payer: Self-pay

## 2019-03-15 ENCOUNTER — Encounter: Payer: Self-pay | Admitting: Physical Therapy

## 2019-03-15 DIAGNOSIS — M6281 Muscle weakness (generalized): Secondary | ICD-10-CM | POA: Diagnosis not present

## 2019-03-15 DIAGNOSIS — R2681 Unsteadiness on feet: Secondary | ICD-10-CM

## 2019-03-15 DIAGNOSIS — R262 Difficulty in walking, not elsewhere classified: Secondary | ICD-10-CM

## 2019-03-15 NOTE — Therapy (Signed)
[place] MAIN [facility] SERVICES [address] [place], [place], [address] Phone: [phone]   Fax:  [phone]  Physical Therapy Treatment  Patient Details  Name: Abigail Hill MRN: [id number] Date of Birth: Jun 15, 1950 Referring Provider (PT): Dr. Metta Clines   Encounter Date: 03/15/2019  PT End of Session - 03/15/19 1129    Visit Number  54    Number of Visits  32    Date for PT Re-Evaluation  05/03/19    Authorization Type  goals addressed 12/16/2018    Authorization Time Period  03/08/19-05/03/19    PT Start Time  1118    PT Stop Time  1200    PT Time Calculation (min)  42 min    Equipment Utilized During Treatment  Gait belt    Activity Tolerance  Patient tolerated treatment well;No increased pain;Patient limited by fatigue    Behavior During Therapy  [facility] for tasks assessed/performed       Past Medical History:  Diagnosis Date  . Abnormal gait 12/18/2015  . BP (high blood pressure) 08/24/2015  . Cancer ([place])    skin  . Constriction of ureter (postoperative) 07/28/2015  . Hydronephrosis 07/14/2015  . Hypertension   . Left foot drop 12/18/2015  . Recurrent UTI   . UPJ (ureteropelvic junction) obstruction 07/28/2015  . UTI (lower urinary tract infection) 07/14/2015    Past Surgical History:  Procedure Laterality Date  . broken foot  2012  . COLONOSCOPY WITH PROPOFOL N/A 06/30/2017   Procedure: COLONOSCOPY WITH PROPOFOL;  Surgeon: Lollie Sails, MD;  Location: [facility] ENDOSCOPY;  Service: Endoscopy;  Laterality: N/A;  . kidney repair  1985   repaired torn blood vessel in kidney    There were no vitals filed for this visit.  Subjective Assessment - 03/15/19 1127    Subjective  Patient reports doing okay. She reports 2 new falls in last month. She reports less soreness today. She is still having trouble with her balance and walking. She has started back walking with the walker.    Pertinent History  69 yo Female reports instability  with gait with difficulty walking. She states, "my left leg gives me trouble. It will  just plant and then I can't pick it up." Pt reports 2 falls in last month. She reports when she falls it is often falling backwards; She was diagnosed with hydrocephalus and did a drain trial with no relief; She did not qualify for a shunt. Repeat imaging showed continued hydrocephalus that has not worsened; MRI of lumbar spine shows L5 pars defect with sacral insufficiency; She denies any back pain and reports no numbness in legs. She reports feeling frustration because they can't seem to find what's wrong with her. She does have urinary incontience; reports knowing when she has to go to the bathroom but can't get there fast enough; Patient has a walker and rollator; She uses both; She also has a manual wheelchair that she uses intermittently;     Currently in Pain?  No/denies    Multiple Pain Sites  No             TREATMENT: Warm up on Nustep BUE/BLE level 2 x6 min with history intake; Gait x80 feet without AD, min A with shorter step length and step to gait pattern. Patient required cues to increase step length for better reciprocal gait pattern and to improve heel/toe gait pattern.  Exercise: Standing with green tband around BLE: -hip abduction 2x10 -hip extension  2x10 -hip flexion SLR 2x10 Patient required min-moderate verbal/tactile cues for correct exercise technique including cues to increase AROM and slow down LE movement for better strengthening; Patient reports increased fatigue requiring short seated rest break after exercise.   Standing on firm surface:  Alternate toe taps to 6 inch step with 1-0 rail assist x15 bilaterally; Alternate march x10 reps unsupported with CGA for safety Heel raises 3 sec hold x10 reps unsupported with CGA for safety Patient required min Vcs to increase AROM and improve weight shift for better dynamic balance control. She was able to exhibit improved forward  weight shift with heel raises with better stance control with less unsteadiness this session;  Following exercise instructed patient in gait x150 feet without AD, CGA to close supervision with cues to increase step length. Patient was able to exhibit improved step length and slightly improved gait speed. She continues to have wide base of support and is walking at slower gait speed.   Neuro Re-ed: Patient requiresMin A-CGA during all standing interventions due to limited stability and patient fear of LOB. Cueing for reduction of UE support required as well as postural alignment for optimal stability within COM Response to treatment: Tolerated well. Patient exhibits improved weight shift in stance this session being able to tolerate advanced balance exercise.                    PT Education - 03/15/19 1129    Education Details  LE strength, balance, HEP    Person(s) Educated  Patient    Methods  Explanation;Verbal cues    Comprehension  Verbalized understanding;Returned demonstration;Verbal cues required;Need further instruction       PT Short Term Goals - 02/09/19 1154      PT SHORT TERM GOAL #1   Title  Patient will be adherent to HEP at least 3x a week to improve functional strength and balance for better safety at home.    Baseline  08/12/18- doing exercise daily; 02/09/19: doing them 3-4 times a week;    Time  4    Period  Weeks    Status  Achieved    Target Date  08/17/18      PT SHORT TERM GOAL #2   Title   Patient will deny any falls over past 4 weeks to demonstrate improved safety awareness at home and work.     Baseline  08/12/18- fell 1 week ago; 09/23/18: none recent;    Time  4    Period  Weeks    Status  Achieved    Target Date  08/17/18        PT Long Term Goals - 03/15/19 1131      PT LONG TERM GOAL #1   Title  Patient will increase Berg Balance score by > 6 points to demonstrate decreased fall risk during functional activities.    Time  8     Period  Weeks    Status  Achieved      PT LONG TERM GOAL #2   Title  Patient will increase 10 meter walk test to >0.8 m/s as to improve gait speed for better community ambulation and to reduce fall risk.    Baseline  12/16/2018: 0.47 m/s; 02/09/19: 0.76 m/s    Time  8    Period  Weeks    Status  On-going    Target Date  05/03/19      PT LONG TERM GOAL #3   Title  Patient  will be require no assist with ascend/descend 3 steps using Least restrictive assistive device to improve safety with entry/exit home.     Baseline  12/16/2018: ascends and descends stairs with RUE rail assist.    Time  8    Period  Weeks    Status  Partially Met    Target Date  05/03/19      PT LONG TERM GOAL #4   Title  Patient will increase BLE gross strength to 4+/5 as to improve functional strength for independent gait, increased standing tolerance and increased ADL ability.    Baseline  12/16/2018: BLE hip ext 3/5, BLE hip add 4-/5, BLE hip abd 4/5; On 1/5 all groups 4+/5 or stronger excepts ABDCT adn extension not assessed.    Time  8    Period  Weeks    Status  Partially Met    Target Date  05/03/19      PT LONG TERM GOAL #5   Title  Patient will increase Berg Balance score to >52/56 to demonstrate decreased fall risk during functional activities.    Baseline  12/16/2018: 48/56;    Time  8    Period  Weeks    Status  On-going    Target Date  05/03/19      PT LONG TERM GOAL #6   Title  Patient will be able to stand on one leg for >5 sec to indicate a decreased fall risk and increase ADL/IADL ability.    Baseline  12/16/2018: Unable to hold for more than 3 seconds, but able to remain standing. 02/09/19: 3-5 sec each LE    Time  8    Period  Weeks    Status  On-going    Target Date  05/03/19      PT LONG TERM GOAL #7   Title  patient will increase Mini Best test score to >18/28 to exhibit improved balance and reduce fall risk.    Time  4    Period  Weeks    Status  Achieved      PT LONG TERM  GOAL #8   Title  Patient will improve cardiovascular endurance as evidenced by increasing 6 min walk test to > 450 feet to exhibit a significant improvement in gait ability for shopping and running errands.    Baseline  12/9: 365 feet; 1/5 AMB c 4WW with quick onset fatigue of LLE and loss heelstrike    Time  8    Period  Weeks    Status  On-going    Target Date  05/03/19            Plan - 03/15/19 1327    Clinical Impression Statement  Patient motivated and participated well within session. She was instructed in advanced LE strengthening requiring min Vcs for proper positioning and exercise technique. Patient able to exhibit improved AROM with increased repetition. Patient instructed in balance exercise with min VCS to improve weight shift for better motor control and dynamic balance. Patient was able to exhibit better dynamic gait ability following exercise with increased step length and improved gait speed. She continues to walk with wide base of support and slower gait speed. Patient reports increased fatigue at end of session. She would benefit from additional skilled PT Intervention to improve strength, balance and mobility;    Rehab Potential  Good    PT Frequency  2x / week    PT Duration  8 weeks    PT Treatment/Interventions  Cryotherapy;Dealer  Stimulation;Moist Heat;Gait training;Stair training;Functional mobility training;Therapeutic activities;Therapeutic exercise;Balance training;Neuromuscular re-education;Patient/family education;Energy conservation    PT Next Visit Plan  progress isolated hip strengthening regimine and gai ttraining.    PT Home Exercise Plan  Needs an update, but none this session.    Consulted and Agree with Plan of Care  Patient       Patient will benefit from skilled therapeutic intervention in order to improve the following deficits and impairments:  Abnormal gait, Decreased balance, Decreased endurance, Decreased mobility, Difficulty walking,  Impaired perceived functional ability, Decreased activity tolerance, Decreased safety awareness  Visit Diagnosis: Muscle weakness (generalized)  Difficulty in walking, not elsewhere classified  Unsteadiness on feet     Problem List Patient Active Problem List   Diagnosis Date Noted  . NPH (normal pressure hydrocephalus) ([place]) 07/30/2016  . Left leg weakness 04/20/2016  . Menopausal osteoporosis 03/04/2016  . Abnormal gait 12/18/2015  . Left foot drop 12/18/2015  . BP (high blood pressure) 08/24/2015  . Hydronephrosis, left 07/28/2015  . UPJ (ureteropelvic junction) obstruction 07/28/2015  . Lower urinary tract infectious disease 07/14/2015  . Hydronephrosis 07/14/2015    Harlem Thresher PT, DPT 03/15/2019, 1:33 PM  [facility] MAIN [facility] SERVICES [address] [place], [place], [address] Phone: [phone]   Fax:  [phone]  Name: MARYLAN GLORE MRN: [id number] Date of Birth: [birth year]

## 2019-03-17 ENCOUNTER — Encounter: Payer: Self-pay | Admitting: Physical Therapy

## 2019-03-17 ENCOUNTER — Other Ambulatory Visit: Payer: Self-pay

## 2019-03-17 ENCOUNTER — Ambulatory Visit: Payer: PPO | Admitting: Physical Therapy

## 2019-03-17 DIAGNOSIS — M6281 Muscle weakness (generalized): Secondary | ICD-10-CM

## 2019-03-17 DIAGNOSIS — R262 Difficulty in walking, not elsewhere classified: Secondary | ICD-10-CM

## 2019-03-17 DIAGNOSIS — R2681 Unsteadiness on feet: Secondary | ICD-10-CM

## 2019-03-17 NOTE — Therapy (Signed)
[place] MAIN [facility] SERVICES [address] [place], [place], [address] Phone: [phone]   Fax:  [phone]  Physical Therapy Treatment  Patient Details  Name: Abigail Hill MRN: [id number] Date of Birth: 21-Feb-1951 Referring Provider (PT): Dr. Metta Clines   Encounter Date: 03/17/2019  PT End of Session - 03/17/19 1140    Visit Number  42    Number of Visits  68    Date for PT Re-Evaluation  05/03/19    Authorization Type  goals addressed 12/16/2018    Authorization Time Period  03/08/19-05/03/19    PT Start Time  1145    PT Stop Time  1230    PT Time Calculation (min)  45 min    Equipment Utilized During Treatment  Gait belt    Activity Tolerance  Patient tolerated treatment well;No increased pain;Patient limited by fatigue    Behavior During Therapy  [facility] for tasks assessed/performed       Past Medical History:  Diagnosis Date  . Abnormal gait 12/18/2015  . BP (high blood pressure) 08/24/2015  . Cancer ([place])    skin  . Constriction of ureter (postoperative) 07/28/2015  . Hydronephrosis 07/14/2015  . Hypertension   . Left foot drop 12/18/2015  . Recurrent UTI   . UPJ (ureteropelvic junction) obstruction 07/28/2015  . UTI (lower urinary tract infection) 07/14/2015    Past Surgical History:  Procedure Laterality Date  . broken foot  2012  . COLONOSCOPY WITH PROPOFOL N/A 06/30/2017   Procedure: COLONOSCOPY WITH PROPOFOL;  Surgeon: Lollie Sails, MD;  Location: [facility] ENDOSCOPY;  Service: Endoscopy;  Laterality: N/A;  . kidney repair  1985   repaired torn blood vessel in kidney    There were no vitals filed for this visit.  Subjective Assessment - 03/17/19 1152    Subjective  Patient reports doing well; Denies any soreness. No new falls.    Pertinent History  69 yo Female reports instability with gait with difficulty walking. She states, "my left leg gives me trouble. It will  just plant and then I can't pick it up." Pt reports 2  falls in last month. She reports when she falls it is often falling backwards; She was diagnosed with hydrocephalus and did a drain trial with no relief; She did not qualify for a shunt. Repeat imaging showed continued hydrocephalus that has not worsened; MRI of lumbar spine shows L5 pars defect with sacral insufficiency; She denies any back pain and reports no numbness in legs. She reports feeling frustration because they can't seem to find what's wrong with her. She does have urinary incontience; reports knowing when she has to go to the bathroom but can't get there fast enough; Patient has a walker and rollator; She uses both; She also has a manual wheelchair that she uses intermittently;     Currently in Pain?  No/denies    Multiple Pain Sites  No              TREATMENT: Warm up on Nustep BUE/BLE level 2 x4 min with history intake;  Instructed patient in balance exercise:   Standing on airex:  Alternate march x10 reps unsupported with CGA for safety Heel raises 3 sec hold x10 reps unsupported with CGA for safety Mini squat unsupported x10 reps CGA for safety Semi-tandem stance unsupported 15 sec hold x1 rep each foot  In front;  Progressing to semi-tandem with head turns side/side x5 reps with CGA for safety;   Weaving  around cones: #6 x4 laps with cues for increased step length and increase erect head with CGA for safety;  Forward stepping reciprocal #2 hurdles, x10 laps with 2-0 rail assist;  Side stepping over 2 hurdles with intermittent rail assist x6 laps each direction;   Resisted walking 12.5# forward/backward x3 laps with 1 HHA and min A for safety with cues for forward weight shift and to increase step length for better gait safety; Patient exhibits less unsteadiness with backwards walking this session compared to previous sessions;    Patient required min Vcs to increase AROM and improve weight shift for better dynamic balance control. She was able to exhibit  improved forward weight shift with heel raises with better stance control with less unsteadiness this session;  Neuro Re-ed: Patient requiresMin A-CGA during all standing interventions due to limited stability and patient fear of LOB. Cueing for reduction of UE support required as well as postural alignment for optimal stability within COM   Response to treatment: Tolerated well. Patient exhibits improved weight shift in stance this session being able to exhibit improved step length and better dynamic balance following side/side weight shift. She does fatigue with prolonged standing but this is minimal. Denies any pain at end of session;                       PT Education - 03/17/19 1140    Education Details  LE strength, balance, HEP    Person(s) Educated  Patient    Methods  Explanation;Verbal cues    Comprehension  Verbalized understanding;Returned demonstration;Verbal cues required;Need further instruction       PT Short Term Goals - 02/09/19 1154      PT SHORT TERM GOAL #1   Title  Patient will be adherent to HEP at least 3x a week to improve functional strength and balance for better safety at home.    Baseline  08/12/18- doing exercise daily; 02/09/19: doing them 3-4 times a week;    Time  4    Period  Weeks    Status  Achieved    Target Date  08/17/18      PT SHORT TERM GOAL #2   Title   Patient will deny any falls over past 4 weeks to demonstrate improved safety awareness at home and work.     Baseline  08/12/18- fell 1 week ago; 09/23/18: none recent;    Time  4    Period  Weeks    Status  Achieved    Target Date  08/17/18        PT Long Term Goals - 03/15/19 1131      PT LONG TERM GOAL #1   Title  Patient will increase Berg Balance score by > 6 points to demonstrate decreased fall risk during functional activities.    Time  8    Period  Weeks    Status  Achieved      PT LONG TERM GOAL #2   Title  Patient will increase 10 meter walk test  to >0.8 m/s as to improve gait speed for better community ambulation and to reduce fall risk.    Baseline  12/16/2018: 0.47 m/s; 02/09/19: 0.76 m/s    Time  8    Period  Weeks    Status  On-going    Target Date  05/03/19      PT LONG TERM GOAL #3   Title  Patient will be require no assist with ascend/descend  3 steps using Least restrictive assistive device to improve safety with entry/exit home.     Baseline  12/16/2018: ascends and descends stairs with RUE rail assist.    Time  8    Period  Weeks    Status  Partially Met    Target Date  05/03/19      PT LONG TERM GOAL #4   Title  Patient will increase BLE gross strength to 4+/5 as to improve functional strength for independent gait, increased standing tolerance and increased ADL ability.    Baseline  12/16/2018: BLE hip ext 3/5, BLE hip add 4-/5, BLE hip abd 4/5; On 1/5 all groups 4+/5 or stronger excepts ABDCT adn extension not assessed.    Time  8    Period  Weeks    Status  Partially Met    Target Date  05/03/19      PT LONG TERM GOAL #5   Title  Patient will increase Berg Balance score to >52/56 to demonstrate decreased fall risk during functional activities.    Baseline  12/16/2018: 48/56;    Time  8    Period  Weeks    Status  On-going    Target Date  05/03/19      PT LONG TERM GOAL #6   Title  Patient will be able to stand on one leg for >5 sec to indicate a decreased fall risk and increase ADL/IADL ability.    Baseline  12/16/2018: Unable to hold for more than 3 seconds, but able to remain standing. 02/09/19: 3-5 sec each LE    Time  8    Period  Weeks    Status  On-going    Target Date  05/03/19      PT LONG TERM GOAL #7   Title  patient will increase Mini Best test score to >18/28 to exhibit improved balance and reduce fall risk.    Time  4    Period  Weeks    Status  Achieved      PT LONG TERM GOAL #8   Title  Patient will improve cardiovascular endurance as evidenced by increasing 6 min walk test to > 450 feet  to exhibit a significant improvement in gait ability for shopping and running errands.    Baseline  12/9: 365 feet; 1/5 AMB c 4WW with quick onset fatigue of LLE and loss heelstrike    Time  8    Period  Weeks    Status  On-going    Target Date  05/03/19            Plan - 03/17/19 1400    Clinical Impression Statement  Patient motivated and participated well within session. Instructed patient in advanced balance exercise, with instruction in static and dynamic balance exercise. Patient requires CGA to min A with most balance exercise. She requires cues for weight shift and to improve erect posture and upper trunk control for better stance control. She initially exhibits short shuffled steps when weaving around cones. This was improved with better step length following instruction for side/side weight shift to improve foot clearance. Patient does require increased time and min VCs and visual cues for proper exercise technique with delayed motor planning. She would benefit from additional skilled PT intervention to improve strength, balance and mobility;    Rehab Potential  Good    PT Frequency  2x / week    PT Duration  8 weeks    PT Treatment/Interventions  Cryotherapy;Electrical Stimulation;Moist Heat;Gait  training;Stair training;Functional mobility training;Therapeutic activities;Therapeutic exercise;Balance training;Neuromuscular re-education;Patient/family education;Energy conservation    PT Next Visit Plan  progress isolated hip strengthening regimine and gai ttraining.    PT Home Exercise Plan  Needs an update, but none this session.    Consulted and Agree with Plan of Care  Patient       Patient will benefit from skilled therapeutic intervention in order to improve the following deficits and impairments:  Abnormal gait, Decreased balance, Decreased endurance, Decreased mobility, Difficulty walking, Impaired perceived functional ability, Decreased activity tolerance, Decreased safety  awareness  Visit Diagnosis: Muscle weakness (generalized)  Difficulty in walking, not elsewhere classified  Unsteadiness on feet     Problem List Patient Active Problem List   Diagnosis Date Noted  . NPH (normal pressure hydrocephalus) ([place]) 07/30/2016  . Left leg weakness 04/20/2016  . Menopausal osteoporosis 03/04/2016  . Abnormal gait 12/18/2015  . Left foot drop 12/18/2015  . BP (high blood pressure) 08/24/2015  . Hydronephrosis, left 07/28/2015  . UPJ (ureteropelvic junction) obstruction 07/28/2015  . Lower urinary tract infectious disease 07/14/2015  . Hydronephrosis 07/14/2015    Sanam Marmo PT, DPT 03/17/2019, 2:04 PM  [place] MAIN [facility] SERVICES [address] [place], [place], [address] Phone: [phone]   Fax:  [phone]  Name: Abigail Hill MRN: [id number] Date of Birth: [birth year]

## 2019-03-22 ENCOUNTER — Other Ambulatory Visit: Payer: Self-pay

## 2019-03-22 ENCOUNTER — Ambulatory Visit: Payer: PPO | Admitting: Physical Therapy

## 2019-03-22 ENCOUNTER — Encounter: Payer: Self-pay | Admitting: Physical Therapy

## 2019-03-22 DIAGNOSIS — R2681 Unsteadiness on feet: Secondary | ICD-10-CM

## 2019-03-22 DIAGNOSIS — M6281 Muscle weakness (generalized): Secondary | ICD-10-CM

## 2019-03-22 DIAGNOSIS — R262 Difficulty in walking, not elsewhere classified: Secondary | ICD-10-CM

## 2019-03-22 NOTE — Therapy (Signed)
[place] MAIN [facility] SERVICES [address] [place], [place], [address] Phone: [phone]   Fax:  [phone]  Physical Therapy Treatment  Patient Details  Name: Abigail Hill MRN: [id number] Date of Birth: 1950-05-14 Referring Provider (PT): Dr. Metta Clines   Encounter Date: 03/22/2019  PT End of Session - 03/22/19 1143    Visit Number  4    Number of Visits  34    Date for PT Re-Evaluation  05/03/19    Authorization Type  goals addressed 12/16/2018    Authorization Time Period  03/08/19-05/03/19    PT Start Time  1146    PT Stop Time  1230    PT Time Calculation (min)  44 min    Equipment Utilized During Treatment  Gait belt    Activity Tolerance  Patient tolerated treatment well;No increased pain;Patient limited by fatigue    Behavior During Therapy  [facility] for tasks assessed/performed       Past Medical History:  Diagnosis Date  . Abnormal gait 12/18/2015  . BP (high blood pressure) 08/24/2015  . Cancer ([place])    skin  . Constriction of ureter (postoperative) 07/28/2015  . Hydronephrosis 07/14/2015  . Hypertension   . Left foot drop 12/18/2015  . Recurrent UTI   . UPJ (ureteropelvic junction) obstruction 07/28/2015  . UTI (lower urinary tract infection) 07/14/2015    Past Surgical History:  Procedure Laterality Date  . broken foot  2012  . COLONOSCOPY WITH PROPOFOL N/A 06/30/2017   Procedure: COLONOSCOPY WITH PROPOFOL;  Surgeon: Lollie Sails, MD;  Location: [facility] ENDOSCOPY;  Service: Endoscopy;  Laterality: N/A;  . kidney repair  1985   repaired torn blood vessel in kidney    There were no vitals filed for this visit.  Subjective Assessment - 03/22/19 1149    Subjective  Patient reports doing well. She reports some fatigue and some neck soreness after getting her teeth cleaned. Denies any new falls.    Pertinent History  69 yo Female reports instability with gait with difficulty walking. She states, "my left leg gives me  trouble. It will  just plant and then I can't pick it up." Pt reports 2 falls in last month. She reports when she falls it is often falling backwards; She was diagnosed with hydrocephalus and did a drain trial with no relief; She did not qualify for a shunt. Repeat imaging showed continued hydrocephalus that has not worsened; MRI of lumbar spine shows L5 pars defect with sacral insufficiency; She denies any back pain and reports no numbness in legs. She reports feeling frustration because they can't seem to find what's wrong with her. She does have urinary incontience; reports knowing when she has to go to the bathroom but can't get there fast enough; Patient has a walker and rollator; She uses both; She also has a manual wheelchair that she uses intermittently;     Currently in Pain?  Yes    Pain Score  5     Pain Location  Neck    Pain Orientation  Lower    Pain Descriptors / Indicators  Aching;Sore    Pain Type  Acute pain    Pain Onset  Today    Pain Frequency  Intermittent    Aggravating Factors   worse with looking up    Pain Relieving Factors  rest/stretch    Effect of Pain on Daily Activities  no change    Multiple Pain Sites  No  TREATMENT: Exercise: Standing with green tband around BLE: -hip abduction 2x15 -hip extension 2x15 -hip flexion SLR 2x15   Seated ankle DF green tband x15 reps bilaterally;  Patient required min-moderate verbal/tactile cues for correct exercise technique including cues to increase AROM and slow down LE movement for better strengthening; Patient reports increased fatigue requiring short seated rest break after exercise.   Standing on firm surface:  Alternate march x10 reps with 2# ankle weight unsupported with CGA for safety  Standing feet together:  Eyes open 30 sec hold, eyes closed 30 sec hold x1 rep each; Modified tandem stance:  Eyes open 30 sec hold, eyes closed 15 sec hold x1 rep each foot in front;   Patient required min  Vcs to increase AROM and improve weight shift for better dynamic balance control.  Following exercise instructed patient in gait x80 feet without AD, CGA to close supervision with cues to increase step length. Patient was able to exhibit improved step length and slightly improved gait speed. She continues to have wide base of support and is walking at slower gait speed.   Neuro Re-ed: Patient requiresMin A-CGA during all standing interventions due to limited stability and patient fear of LOB. Cueing for reduction of UE support required as well as postural alignment for optimal stability within COM Response to treatment: Tolerated well. Patient exhibits improved weight shift in stance this session being able to tolerate advanced balance exercise.She had less difficulty and less unsteadiness with eyes closed this session.                     PT Education - 03/22/19 1257    Education Details  LE strength, balance, HEP    Person(s) Educated  Patient    Methods  Explanation;Verbal cues    Comprehension  Verbalized understanding;Returned demonstration;Verbal cues required;Need further instruction       PT Short Term Goals - 02/09/19 1154      PT SHORT TERM GOAL #1   Title  Patient will be adherent to HEP at least 3x a week to improve functional strength and balance for better safety at home.    Baseline  08/12/18- doing exercise daily; 02/09/19: doing them 3-4 times a week;    Time  4    Period  Weeks    Status  Achieved    Target Date  08/17/18      PT SHORT TERM GOAL #2   Title   Patient will deny any falls over past 4 weeks to demonstrate improved safety awareness at home and work.     Baseline  08/12/18- fell 1 week ago; 09/23/18: none recent;    Time  4    Period  Weeks    Status  Achieved    Target Date  08/17/18        PT Long Term Goals - 03/15/19 1131      PT LONG TERM GOAL #1   Title  Patient will increase Berg Balance score by > 6 points to  demonstrate decreased fall risk during functional activities.    Time  8    Period  Weeks    Status  Achieved      PT LONG TERM GOAL #2   Title  Patient will increase 10 meter walk test to >0.8 m/s as to improve gait speed for better community ambulation and to reduce fall risk.    Baseline  12/16/2018: 0.47 m/s; 02/09/19: 0.76 m/s    Time  8  Period  Weeks    Status  On-going    Target Date  05/03/19      PT LONG TERM GOAL #3   Title  Patient will be require no assist with ascend/descend 3 steps using Least restrictive assistive device to improve safety with entry/exit home.     Baseline  12/16/2018: ascends and descends stairs with RUE rail assist.    Time  8    Period  Weeks    Status  Partially Met    Target Date  05/03/19      PT LONG TERM GOAL #4   Title  Patient will increase BLE gross strength to 4+/5 as to improve functional strength for independent gait, increased standing tolerance and increased ADL ability.    Baseline  12/16/2018: BLE hip ext 3/5, BLE hip add 4-/5, BLE hip abd 4/5; On 1/5 all groups 4+/5 or stronger excepts ABDCT adn extension not assessed.    Time  8    Period  Weeks    Status  Partially Met    Target Date  05/03/19      PT LONG TERM GOAL #5   Title  Patient will increase Berg Balance score to >52/56 to demonstrate decreased fall risk during functional activities.    Baseline  12/16/2018: 48/56;    Time  8    Period  Weeks    Status  On-going    Target Date  05/03/19      PT LONG TERM GOAL #6   Title  Patient will be able to stand on one leg for >5 sec to indicate a decreased fall risk and increase ADL/IADL ability.    Baseline  12/16/2018: Unable to hold for more than 3 seconds, but able to remain standing. 02/09/19: 3-5 sec each LE    Time  8    Period  Weeks    Status  On-going    Target Date  05/03/19      PT LONG TERM GOAL #7   Title  patient will increase Mini Best test score to >18/28 to exhibit improved balance and reduce fall risk.     Time  4    Period  Weeks    Status  Achieved      PT LONG TERM GOAL #8   Title  Patient will improve cardiovascular endurance as evidenced by increasing 6 min walk test to > 450 feet to exhibit a significant improvement in gait ability for shopping and running errands.    Baseline  12/9: 365 feet; 1/5 AMB c 4WW with quick onset fatigue of LLE and loss heelstrike    Time  8    Period  Weeks    Status  On-going    Target Date  05/03/19            Plan - 03/22/19 1257    Clinical Impression Statement  Patient motivated and participated well within session. She was instructed in advanced LE strengthening exercise with increased repetition/resistance. Patient does require min VCs for correct exercise technique. She fatigues quickly requiring frequent rest breaks. Patient has progressed with balance/strengthening, being able to stand unsupported with less assistance. She continues to have high fear avoidance, requiring increased time to try new tasks or to do activities with less rail assist. She would benefit from additional skilled PT intervention to improve strength, balance and gait safety;    Rehab Potential  Good    PT Frequency  2x / week    PT Duration  8 weeks    PT Treatment/Interventions  Cryotherapy;Electrical Stimulation;Moist Heat;Gait training;Stair training;Functional mobility training;Therapeutic activities;Therapeutic exercise;Balance training;Neuromuscular re-education;Patient/family education;Energy conservation    PT Next Visit Plan  progress isolated hip strengthening regimine and gai ttraining.    PT Home Exercise Plan  Needs an update, but none this session.    Consulted and Agree with Plan of Care  Patient       Patient will benefit from skilled therapeutic intervention in order to improve the following deficits and impairments:  Abnormal gait, Decreased balance, Decreased endurance, Decreased mobility, Difficulty walking, Impaired perceived functional ability,  Decreased activity tolerance, Decreased safety awareness  Visit Diagnosis: Muscle weakness (generalized)  Difficulty in walking, not elsewhere classified  Unsteadiness on feet     Problem List Patient Active Problem List   Diagnosis Date Noted  . NPH (normal pressure hydrocephalus) ([place]) 07/30/2016  . Left leg weakness 04/20/2016  . Menopausal osteoporosis 03/04/2016  . Abnormal gait 12/18/2015  . Left foot drop 12/18/2015  . BP (high blood pressure) 08/24/2015  . Hydronephrosis, left 07/28/2015  . UPJ (ureteropelvic junction) obstruction 07/28/2015  . Lower urinary tract infectious disease 07/14/2015  . Hydronephrosis 07/14/2015    Kaci Dillie PT, DPT 03/23/2019, 8:07 AM  [place] MAIN [facility] SERVICES [address] [place], [place], [address] Phone: [phone]   Fax:  [phone]  Name: Abigail Hill MRN: [id number] Date of Birth: [birth year]

## 2019-03-24 ENCOUNTER — Ambulatory Visit: Payer: PPO | Admitting: Physical Therapy

## 2019-03-24 ENCOUNTER — Other Ambulatory Visit: Payer: Self-pay

## 2019-03-24 ENCOUNTER — Encounter: Payer: Self-pay | Admitting: Physical Therapy

## 2019-03-24 DIAGNOSIS — R262 Difficulty in walking, not elsewhere classified: Secondary | ICD-10-CM

## 2019-03-24 DIAGNOSIS — M6281 Muscle weakness (generalized): Secondary | ICD-10-CM

## 2019-03-24 DIAGNOSIS — R2681 Unsteadiness on feet: Secondary | ICD-10-CM

## 2019-03-24 NOTE — Therapy (Signed)
[place] MAIN [facility] SERVICES [address] [place], [place], [address] Phone: [phone]   Fax:  [phone]  Physical Therapy Treatment Physical Therapy Progress Note   Dates of reporting period 02/09/19   to  03/24/19   Patient Details  Name: Abigail Hill MRN: [id number] Date of Birth: [birth year] Referring Provider (PT): Dr. Metta Clines   Encounter Date: 03/24/2019  PT End of Session - 03/24/19 1214    Visit Number  60    Number of Visits  30    Date for PT Re-Evaluation  05/03/19    Authorization Type  goals addressed 12/16/2018    Authorization Time Period  03/08/19-05/03/19    PT Start Time  1147    PT Stop Time  1230    PT Time Calculation (min)  43 min    Equipment Utilized During Treatment  Gait belt    Activity Tolerance  Patient tolerated treatment well;No increased pain;Patient limited by fatigue    Behavior During Therapy  [facility] for tasks assessed/performed       Past Medical History:  Diagnosis Date  . Abnormal gait 12/18/2015  . BP (high blood pressure) 08/24/2015  . Cancer ([place])    skin  . Constriction of ureter (postoperative) 07/28/2015  . Hydronephrosis 07/14/2015  . Hypertension   . Left foot drop 12/18/2015  . Recurrent UTI   . UPJ (ureteropelvic junction) obstruction 07/28/2015  . UTI (lower urinary tract infection) 07/14/2015    Past Surgical History:  Procedure Laterality Date  . broken foot  2012  . COLONOSCOPY WITH PROPOFOL N/A 06/30/2017   Procedure: COLONOSCOPY WITH PROPOFOL;  Surgeon: Lollie Sails, MD;  Location: [facility] ENDOSCOPY;  Service: Endoscopy;  Laterality: N/A;  . kidney repair  1985   repaired torn blood vessel in kidney    There were no vitals filed for this visit.  Subjective Assessment - 03/24/19 1153    Subjective  Patient reports doing well; She reports her neck is a little better. She denies any new falls.    Pertinent History  69 yo Female reports instability with gait with  difficulty walking. She states, "my left leg gives me trouble. It will  just plant and then I can't pick it up." Pt reports 2 falls in last month. She reports when she falls it is often falling backwards; She was diagnosed with hydrocephalus and did a drain trial with no relief; She did not qualify for a shunt. Repeat imaging showed continued hydrocephalus that has not worsened; MRI of lumbar spine shows L5 pars defect with sacral insufficiency; She denies any back pain and reports no numbness in legs. She reports feeling frustration because they can't seem to find what's wrong with her. She does have urinary incontience; reports knowing when she has to go to the bathroom but can't get there fast enough; Patient has a walker and rollator; She uses both; She also has a manual wheelchair that she uses intermittently;     Currently in Pain?  No/denies    Pain Onset  Today    Multiple Pain Sites  No         [facility] PT Assessment - 03/24/19 0001      6 minute walk test results    Aerobic Endurance Distance Walked  390    Endurance additional comments  without AD, CGA, slightly more improved than 375 on 02/09/19 but less than community ambulator which is 1000 feet      [employer]  Test   Sit to Stand  Able to stand without using hands and stabilize independently    Standing Unsupported  Able to stand safely 2 minutes    Sitting with Back Unsupported but Feet Supported on Floor or Stool  Able to sit safely and securely 2 minutes    Stand to Sit  Sits safely with minimal use of hands    Transfers  Able to transfer safely, minor use of hands    Standing Unsupported with Eyes Closed  Able to stand 10 seconds safely    Standing Unsupported with Feet Together  Able to place feet together independently and stand 1 minute safely    From Standing, Reach Forward with Outstretched Arm  Can reach confidently >25 cm (10")    From Standing Position, Pick up Object from Floor  Able to pick up shoe safely and easily     From Standing Position, Turn to Look Behind Over each Shoulder  Looks behind from both sides and weight shifts well    Turn 360 Degrees  Able to turn 360 degrees safely but slowly    Standing Unsupported, Alternately Place Feet on Step/Stool  Able to stand independently and safely and complete 8 steps in 20 seconds    Standing Unsupported, One Foot in Front  Able to plae foot ahead of the other independently and hold 30 seconds    Standing on One Leg  Tries to lift leg/unable to hold 3 seconds but remains standing independently    Total Score  50        TREATMENT: Patient warm up on Nustep BUE/BLE level 2 x5 min (Unbilled);  Instructed patient in 6 min walk test and Berg Balance Assessment to address goals  Please see above;  During 6 min walk, pt requires CGA for safety and cues for weight shift to improve foot clearance. She will exhibits short shuffled steps with wide base of support with increased fatigue. Periodically instructed patient to stop walking, and perform 5 side/side weight shifts with improved foot clearance and better gait mechanics when walking resumed.  Patient exhibits mild improvement in balance as evidenced with berg balance assessment.  Patient's condition has the potential to improve in response to therapy. Maximum improvement is yet to be obtained. The anticipated improvement is attainable and reasonable in a generally predictable time.  Patient reports adherence with HEP and has been working on walking at home. She does report feeling more unsteady at times and has been using her walker more at home for better safety awareness.                     PT Education - 03/24/19 1213    Education Details  Progress towards goals, POC    Person(s) Educated  Patient    Methods  Explanation;Verbal cues    Comprehension  Verbalized understanding;Returned demonstration;Verbal cues required;Need further instruction       PT Short Term Goals - 02/09/19  1154      PT SHORT TERM GOAL #1   Title  Patient will be adherent to HEP at least 3x a week to improve functional strength and balance for better safety at home.    Baseline  08/12/18- doing exercise daily; 02/09/19: doing them 3-4 times a week;    Time  4    Period  Weeks    Status  Achieved    Target Date  08/17/18      PT SHORT TERM GOAL #2  Title   Patient will deny any falls over past 4 weeks to demonstrate improved safety awareness at home and work.     Baseline  08/12/18- fell 1 week ago; 09/23/18: none recent;    Time  4    Period  Weeks    Status  Achieved    Target Date  08/17/18        PT Long Term Goals - 03/15/19 1131      PT LONG TERM GOAL #1   Title  Patient will increase Berg Balance score by > 6 points to demonstrate decreased fall risk during functional activities.    Time  8    Period  Weeks    Status  Achieved      PT LONG TERM GOAL #2   Title  Patient will increase 10 meter walk test to >0.8 m/s as to improve gait speed for better community ambulation and to reduce fall risk.    Baseline  12/16/2018: 0.47 m/s; 02/09/19: 0.76 m/s    Time  8    Period  Weeks    Status  On-going    Target Date  05/03/19      PT LONG TERM GOAL #3   Title  Patient will be require no assist with ascend/descend 3 steps using Least restrictive assistive device to improve safety with entry/exit home.     Baseline  12/16/2018: ascends and descends stairs with RUE rail assist.    Time  8    Period  Weeks    Status  Partially Met    Target Date  05/03/19      PT LONG TERM GOAL #4   Title  Patient will increase BLE gross strength to 4+/5 as to improve functional strength for independent gait, increased standing tolerance and increased ADL ability.    Baseline  12/16/2018: BLE hip ext 3/5, BLE hip add 4-/5, BLE hip abd 4/5; On 1/5 all groups 4+/5 or stronger excepts ABDCT adn extension not assessed.    Time  8    Period  Weeks    Status  Partially Met    Target Date  05/03/19       PT LONG TERM GOAL #5   Title  Patient will increase Berg Balance score to >52/56 to demonstrate decreased fall risk during functional activities.    Baseline  12/16/2018: 48/56;    Time  8    Period  Weeks    Status  On-going    Target Date  05/03/19      PT LONG TERM GOAL #6   Title  Patient will be able to stand on one leg for >5 sec to indicate a decreased fall risk and increase ADL/IADL ability.    Baseline  12/16/2018: Unable to hold for more than 3 seconds, but able to remain standing. 02/09/19: 3-5 sec each LE    Time  8    Period  Weeks    Status  On-going    Target Date  05/03/19      PT LONG TERM GOAL #7   Title  patient will increase Mini Best test score to >18/28 to exhibit improved balance and reduce fall risk.    Time  4    Period  Weeks    Status  Achieved      PT LONG TERM GOAL #8   Title  Patient will improve cardiovascular endurance as evidenced by increasing 6 min walk test to > 450 feet to exhibit a significant  improvement in gait ability for shopping and running errands.    Baseline  12/9: 365 feet; 1/5 AMB c 4WW with quick onset fatigue of LLE and loss heelstrike    Time  8    Period  Weeks    Status  On-going    Target Date  05/03/19            Plan - 03/24/19 1521    Clinical Impression Statement  Patient motivated and participated well within session. She was instructed in 6 min walk and Berg Balance Assessment to address goals. Overall patient continues to make improvement, but it is slow. She continues to require increased time for motor planning and cues for proper gait mechanics. Patient exhibits decreased weight shift to RLE and short step length on LLE especially with increased fatigue. She would benefit from additional skilled PT intervention to improve strength, balance and gait safety;    Rehab Potential  Good    PT Frequency  2x / week    PT Duration  8 weeks    PT Treatment/Interventions  Cryotherapy;Electrical Stimulation;Moist  Heat;Gait training;Stair training;Functional mobility training;Therapeutic activities;Therapeutic exercise;Balance training;Neuromuscular re-education;Patient/family education;Energy conservation    PT Next Visit Plan  progress isolated hip strengthening regimine and gai ttraining.    PT Home Exercise Plan  Needs an update, but none this session.    Consulted and Agree with Plan of Care  Patient       Patient will benefit from skilled therapeutic intervention in order to improve the following deficits and impairments:  Abnormal gait, Decreased balance, Decreased endurance, Decreased mobility, Difficulty walking, Impaired perceived functional ability, Decreased activity tolerance, Decreased safety awareness  Visit Diagnosis: Muscle weakness (generalized)  Difficulty in walking, not elsewhere classified  Unsteadiness on feet     Problem List Patient Active Problem List   Diagnosis Date Noted  . NPH (normal pressure hydrocephalus) ([place]) 07/30/2016  . Left leg weakness 04/20/2016  . Menopausal osteoporosis 03/04/2016  . Abnormal gait 12/18/2015  . Left foot drop 12/18/2015  . BP (high blood pressure) 08/24/2015  . Hydronephrosis, left 07/28/2015  . UPJ (ureteropelvic junction) obstruction 07/28/2015  . Lower urinary tract infectious disease 07/14/2015  . Hydronephrosis 07/14/2015    Kaydince Towles PT, DPT 03/24/2019, 3:26 PM  [place] MAIN [facility] SERVICES [address] [place], [place], [address] Phone: [phone]   Fax:  [phone]  Name: TAJHA SAMMARCO MRN: [id number] Date of Birth: 03-22-1950

## 2019-03-29 ENCOUNTER — Encounter: Payer: Self-pay | Admitting: Physical Therapy

## 2019-03-29 ENCOUNTER — Other Ambulatory Visit: Payer: Self-pay

## 2019-03-29 ENCOUNTER — Ambulatory Visit: Payer: PPO | Admitting: Physical Therapy

## 2019-03-29 DIAGNOSIS — I10 Essential (primary) hypertension: Secondary | ICD-10-CM | POA: Diagnosis not present

## 2019-03-29 DIAGNOSIS — M6281 Muscle weakness (generalized): Secondary | ICD-10-CM | POA: Diagnosis not present

## 2019-03-29 DIAGNOSIS — R262 Difficulty in walking, not elsewhere classified: Secondary | ICD-10-CM

## 2019-03-29 DIAGNOSIS — R2681 Unsteadiness on feet: Secondary | ICD-10-CM

## 2019-03-29 NOTE — Therapy (Signed)
[place] MAIN [facility] SERVICES [address] [place], [place], [address] Phone: [phone]   Fax:  [phone]  Physical Therapy Treatment  Patient Details  Name: Abigail Hill MRN: [id number] Date of Birth: 1950-12-30 Referring Provider (PT): Dr. Metta Clines   Encounter Date: 03/29/2019  PT End of Session - 03/29/19 1152    Visit Number  61    Number of Visits  32    Date for PT Re-Evaluation  05/03/19    Authorization Type  goals addressed 12/16/2018    Authorization Time Period  03/08/19-05/03/19    PT Start Time  1146    PT Stop Time  1230    PT Time Calculation (min)  44 min    Equipment Utilized During Treatment  Gait belt    Activity Tolerance  Patient tolerated treatment well;No increased pain;Patient limited by fatigue    Behavior During Therapy  [facility] for tasks assessed/performed       Past Medical History:  Diagnosis Date  . Abnormal gait 12/18/2015  . BP (high blood pressure) 08/24/2015  . Cancer ([place])    skin  . Constriction of ureter (postoperative) 07/28/2015  . Hydronephrosis 07/14/2015  . Hypertension   . Left foot drop 12/18/2015  . Recurrent UTI   . UPJ (ureteropelvic junction) obstruction 07/28/2015  . UTI (lower urinary tract infection) 07/14/2015    Past Surgical History:  Procedure Laterality Date  . broken foot  2012  . COLONOSCOPY WITH PROPOFOL N/A 06/30/2017   Procedure: COLONOSCOPY WITH PROPOFOL;  Surgeon: Lollie Sails, MD;  Location: [facility] ENDOSCOPY;  Service: Endoscopy;  Laterality: N/A;  . kidney repair  1985   repaired torn blood vessel in kidney    There were no vitals filed for this visit.  Subjective Assessment - 03/29/19 1151    Subjective  Patient reports doing well; Denies any new falls. She is still having weakness in BLE.    Pertinent History  69 yo Female reports instability with gait with difficulty walking. She states, "my left leg gives me trouble. It will  just plant and then I can't pick  it up." Pt reports 2 falls in last month. She reports when she falls it is often falling backwards; She was diagnosed with hydrocephalus and did a drain trial with no relief; She did not qualify for a shunt. Repeat imaging showed continued hydrocephalus that has not worsened; MRI of lumbar spine shows L5 pars defect with sacral insufficiency; She denies any back pain and reports no numbness in legs. She reports feeling frustration because they can't seem to find what's wrong with her. She does have urinary incontience; reports knowing when she has to go to the bathroom but can't get there fast enough; Patient has a walker and rollator; She uses both; She also has a manual wheelchair that she uses intermittently;     Currently in Pain?  No/denies    Pain Onset  Today    Multiple Pain Sites  No           TREATMENT: Exercise: Standing with green tband around BLE: -hip abduction 2x15 -hip extension 2x15 -hip flexion SLR 2x15  Side stepping unsupported x10 feet x2 laps each direction unsupported with close supervision for safety;  Patient required min VCs for proper positioning and to avoid trunk lean with advanced tband exercise. She also required min VCs to avoid hip ER when side stepping for improved hip abductor strengthening;   Side step ups up and  over 4 inch step with BUE assist x10 reps each direction; Required min VCs to reduce looking down and improve erect posture for better dynamic balance and positioning;   Patient reports increased fatigue requiring short seated rest break after exercise.  Patient instructed in stepping over 2 orange hurdles forward reciprocally, starting with 2 rail assist, progressing to 1 rail and then no rail assist, requiring CGA to close supervision for safety with stepping over hurdles x15 reps.  Patient able to exhibit improved step length and better foot clearance with subsequent repetitions.    Response to treatment: Tolerated well. Pt exhibits  less fatigue this session requiring fewer rest breaks. She also was able to tolerate increased repetition/resistance with strengthening. She is progressing with dynamic balance exercise being able to step over hurdles with less rail assist. Patient does require increased time/repetition for improved motor planning with advanced tasks.                       PT Education - 03/29/19 1152    Education Details  LE strength, balance, HEP    Person(s) Educated  Patient    Methods  Explanation;Verbal cues    Comprehension  Verbalized understanding;Returned demonstration;Verbal cues required;Need further instruction       PT Short Term Goals - 02/09/19 1154      PT SHORT TERM GOAL #1   Title  Patient will be adherent to HEP at least 3x a week to improve functional strength and balance for better safety at home.    Baseline  08/12/18- doing exercise daily; 02/09/19: doing them 3-4 times a week;    Time  4    Period  Weeks    Status  Achieved    Target Date  08/17/18      PT SHORT TERM GOAL #2   Title   Patient will deny any falls over past 4 weeks to demonstrate improved safety awareness at home and work.     Baseline  08/12/18- fell 1 week ago; 09/23/18: none recent;    Time  4    Period  Weeks    Status  Achieved    Target Date  08/17/18        PT Long Term Goals - 03/15/19 1131      PT LONG TERM GOAL #1   Title  Patient will increase Berg Balance score by > 6 points to demonstrate decreased fall risk during functional activities.    Time  8    Period  Weeks    Status  Achieved      PT LONG TERM GOAL #2   Title  Patient will increase 10 meter walk test to >0.8 m/s as to improve gait speed for better community ambulation and to reduce fall risk.    Baseline  12/16/2018: 0.47 m/s; 02/09/19: 0.76 m/s    Time  8    Period  Weeks    Status  On-going    Target Date  05/03/19      PT LONG TERM GOAL #3   Title  Patient will be require no assist with ascend/descend 3  steps using Least restrictive assistive device to improve safety with entry/exit home.     Baseline  12/16/2018: ascends and descends stairs with RUE rail assist.    Time  8    Period  Weeks    Status  Partially Met    Target Date  05/03/19      PT LONG TERM  GOAL #4   Title  Patient will increase BLE gross strength to 4+/5 as to improve functional strength for independent gait, increased standing tolerance and increased ADL ability.    Baseline  12/16/2018: BLE hip ext 3/5, BLE hip add 4-/5, BLE hip abd 4/5; On 1/5 all groups 4+/5 or stronger excepts ABDCT adn extension not assessed.    Time  8    Period  Weeks    Status  Partially Met    Target Date  05/03/19      PT LONG TERM GOAL #5   Title  Patient will increase Berg Balance score to >52/56 to demonstrate decreased fall risk during functional activities.    Baseline  12/16/2018: 48/56;    Time  8    Period  Weeks    Status  On-going    Target Date  05/03/19      PT LONG TERM GOAL #6   Title  Patient will be able to stand on one leg for >5 sec to indicate a decreased fall risk and increase ADL/IADL ability.    Baseline  12/16/2018: Unable to hold for more than 3 seconds, but able to remain standing. 02/09/19: 3-5 sec each LE    Time  8    Period  Weeks    Status  On-going    Target Date  05/03/19      PT LONG TERM GOAL #7   Title  patient will increase Mini Best test score to >18/28 to exhibit improved balance and reduce fall risk.    Time  4    Period  Weeks    Status  Achieved      PT LONG TERM GOAL #8   Title  Patient will improve cardiovascular endurance as evidenced by increasing 6 min walk test to > 450 feet to exhibit a significant improvement in gait ability for shopping and running errands.    Baseline  12/9: 365 feet; 1/5 AMB c 4WW with quick onset fatigue of LLE and loss heelstrike    Time  8    Period  Weeks    Status  On-going    Target Date  05/03/19            Plan - 03/29/19 1449    Clinical  Impression Statement  Patient motivated and participated well within session. She reports minimal fatigue with advanced exercise. Patient able to progress dynamic balance with stepping over obstacles without rail assist with improved foot clearance. She does require increased repetition/time for better motor planning and improved exercise technique. Patient would benefit from additional skilled PT intervention to improve strength, balance and mobility;    Rehab Potential  Good    PT Frequency  2x / week    PT Duration  8 weeks    PT Treatment/Interventions  Cryotherapy;Electrical Stimulation;Moist Heat;Gait training;Stair training;Functional mobility training;Therapeutic activities;Therapeutic exercise;Balance training;Neuromuscular re-education;Patient/family education;Energy conservation    PT Next Visit Plan  progress isolated hip strengthening regimine and gai ttraining.    PT Home Exercise Plan  Needs an update, but none this session.    Consulted and Agree with Plan of Care  Patient       Patient will benefit from skilled therapeutic intervention in order to improve the following deficits and impairments:  Abnormal gait, Decreased balance, Decreased endurance, Decreased mobility, Difficulty walking, Impaired perceived functional ability, Decreased activity tolerance, Decreased safety awareness  Visit Diagnosis: Muscle weakness (generalized)  Difficulty in walking, not elsewhere classified  Unsteadiness on feet  Problem List Patient Active Problem List   Diagnosis Date Noted  . NPH (normal pressure hydrocephalus) ([place]) 07/30/2016  . Left leg weakness 04/20/2016  . Menopausal osteoporosis 03/04/2016  . Abnormal gait 12/18/2015  . Left foot drop 12/18/2015  . BP (high blood pressure) 08/24/2015  . Hydronephrosis, left 07/28/2015  . UPJ (ureteropelvic junction) obstruction 07/28/2015  . Lower urinary tract infectious disease 07/14/2015  . Hydronephrosis 07/14/2015     Kourosh Jablonsky PT, DPT 03/29/2019, 2:54 PM  [place] MAIN [facility] SERVICES [address] [place], [place], [address] Phone: [phone]   Fax:  [phone]  Name: MICHEAL SHEEN MRN: [id number] Date of Birth: [birth year]

## 2019-03-31 ENCOUNTER — Ambulatory Visit: Payer: PPO | Admitting: Physical Therapy

## 2019-04-05 ENCOUNTER — Encounter: Payer: Self-pay | Admitting: Physical Therapy

## 2019-04-05 ENCOUNTER — Ambulatory Visit: Payer: PPO | Attending: Neurology | Admitting: Physical Therapy

## 2019-04-05 ENCOUNTER — Other Ambulatory Visit: Payer: Self-pay

## 2019-04-05 DIAGNOSIS — M6281 Muscle weakness (generalized): Secondary | ICD-10-CM | POA: Diagnosis not present

## 2019-04-05 DIAGNOSIS — R2681 Unsteadiness on feet: Secondary | ICD-10-CM | POA: Insufficient documentation

## 2019-04-05 DIAGNOSIS — Z0000 Encounter for general adult medical examination without abnormal findings: Secondary | ICD-10-CM | POA: Diagnosis not present

## 2019-04-05 DIAGNOSIS — R262 Difficulty in walking, not elsewhere classified: Secondary | ICD-10-CM | POA: Insufficient documentation

## 2019-04-05 DIAGNOSIS — I10 Essential (primary) hypertension: Secondary | ICD-10-CM | POA: Diagnosis not present

## 2019-04-05 DIAGNOSIS — R29898 Other symptoms and signs involving the musculoskeletal system: Secondary | ICD-10-CM | POA: Diagnosis not present

## 2019-04-05 DIAGNOSIS — Z0001 Encounter for general adult medical examination with abnormal findings: Secondary | ICD-10-CM | POA: Diagnosis not present

## 2019-04-05 DIAGNOSIS — G912 (Idiopathic) normal pressure hydrocephalus: Secondary | ICD-10-CM | POA: Diagnosis not present

## 2019-04-05 NOTE — Therapy (Signed)
[place] MAIN [facility] SERVICES [address] [place], [place], [address] Phone: [phone]   Fax:  [phone]  Physical Therapy Treatment  Patient Details  Name: Abigail Hill MRN: [id number] Date of Birth: 1951/02/21 Referring Provider (PT): Dr. Metta Clines   Encounter Date: 04/05/2019  PT End of Session - 04/05/19 1150    Visit Number  73    Number of Visits  81    Date for PT Re-Evaluation  05/03/19    Authorization Type  goals addressed 12/16/2018    Authorization Time Period  03/08/19-05/03/19    PT Start Time  1146    PT Stop Time  1230    PT Time Calculation (min)  44 min    Equipment Utilized During Treatment  Gait belt    Activity Tolerance  Patient tolerated treatment well;No increased pain;Patient limited by fatigue    Behavior During Therapy  [facility] for tasks assessed/performed       Past Medical History:  Diagnosis Date  . Abnormal gait 12/18/2015  . BP (high blood pressure) 08/24/2015  . Cancer ([place])    skin  . Constriction of ureter (postoperative) 07/28/2015  . Hydronephrosis 07/14/2015  . Hypertension   . Left foot drop 12/18/2015  . Recurrent UTI   . UPJ (ureteropelvic junction) obstruction 07/28/2015  . UTI (lower urinary tract infection) 07/14/2015    Past Surgical History:  Procedure Laterality Date  . broken foot  2012  . COLONOSCOPY WITH PROPOFOL N/A 06/30/2017   Procedure: COLONOSCOPY WITH PROPOFOL;  Surgeon: Lollie Sails, MD;  Location: [facility] ENDOSCOPY;  Service: Endoscopy;  Laterality: N/A;  . kidney repair  1985   repaired torn blood vessel in kidney    There were no vitals filed for this visit.  Subjective Assessment - 04/05/19 1149    Subjective  Patient reports doing well; denies any falls; Denies any soreness; She reports being a little stiff with cold weather.    Pertinent History  69 yo Female reports instability with gait with difficulty walking. She states, "my left leg gives me trouble. It will   just plant and then I can't pick it up." Pt reports 2 falls in last month. She reports when she falls it is often falling backwards; She was diagnosed with hydrocephalus and did a drain trial with no relief; She did not qualify for a shunt. Repeat imaging showed continued hydrocephalus that has not worsened; MRI of lumbar spine shows L5 pars defect with sacral insufficiency; She denies any back pain and reports no numbness in legs. She reports feeling frustration because they can't seem to find what's wrong with her. She does have urinary incontience; reports knowing when she has to go to the bathroom but can't get there fast enough; Patient has a walker and rollator; She uses both; She also has a manual wheelchair that she uses intermittently;     Currently in Pain?  No/denies    Pain Onset  Today            TREATMENT: Warm up on Nustep BUE/BLE level 2 x3 min (unbilled)  Exercise: Standing with green tband around BLE: -hip abduction x15 -hip extension x15 -hip flexion SLR x15  Side stepping unsupported x10 feet x2 laps each direction unsupported with close supervision for safety;  Patient required min VCs for proper positioning and to avoid trunk lean with advanced tband exercise. She also required min VCs to avoid hip ER when side stepping for improved hip abductor  strengthening;   Standing in parallel bars: Hip flexion march against green tband x15 reps bilaterally with min VCs to increase hip flexor ROM and improve erect posture for better ROM and strengthening;    Forward lunges in parallel bars x10 reps each LE with 2-1 rail assist with min VCs to increase step length for better positioning and quad activation;      NMR: Standing on airex pad: -unsupported standing heel raises 3 sec hold x10 repetitions with CGA to close supervision; -unsupported standing eyes open 30 sec hold, eyes closed 15 sec hold x2 sets; Required CGA to close supervision for safety;  Initially patient  exhibits increased left lateral lean, but was able to self correct with verbal cues to midline.    Side step ups up and over airex- 4 inch step-airex with BUE assist x10 reps each direction; Required min VCs to reduce looking down and improve erect posture for better dynamic balance and positioning;   Gait at end of session, x80 feet in gym without AD, CGA to close supervision with min VCs to increase step length and increase ankle DF for better foot clearance. Patient often looks down at feet when walking and required cues to increase erect posture for better environmental scanning. However when looking up, patient exhibits increased foot drag/shuffle.      Response to treatment: Tolerated well. Pt exhibits less fatigue this session requiring fewer rest breaks. She also was able to tolerate increased repetition/resistance with strengthening.  Patient does require increased time/repetition for improved motor planning with advanced tasks.  She did exhibit increased foot drag with ambulation when cued to look up for environmental scanning. Patient exhibits increased shuffled steps and wide base of support when turning or walking in narrow environment.                        PT Education - 04/05/19 1149    Education Details  LE strength, balance/HEP    Person(s) Educated  Patient    Methods  Explanation;Verbal cues    Comprehension  Verbalized understanding;Returned demonstration;Verbal cues required;Need further instruction       PT Short Term Goals - 02/09/19 1154      PT SHORT TERM GOAL #1   Title  Patient will be adherent to HEP at least 3x a week to improve functional strength and balance for better safety at home.    Baseline  08/12/18- doing exercise daily; 02/09/19: doing them 3-4 times a week;    Time  4    Period  Weeks    Status  Achieved    Target Date  08/17/18      PT SHORT TERM GOAL #2   Title   Patient will deny any falls over past 4 weeks to demonstrate  improved safety awareness at home and work.     Baseline  08/12/18- fell 1 week ago; 09/23/18: none recent;    Time  4    Period  Weeks    Status  Achieved    Target Date  08/17/18        PT Long Term Goals - 03/15/19 1131      PT LONG TERM GOAL #1   Title  Patient will increase Berg Balance score by > 6 points to demonstrate decreased fall risk during functional activities.    Time  8    Period  Weeks    Status  Achieved      PT LONG TERM GOAL #  2   Title  Patient will increase 10 meter walk test to >0.8 m/s as to improve gait speed for better community ambulation and to reduce fall risk.    Baseline  12/16/2018: 0.47 m/s; 02/09/19: 0.76 m/s    Time  8    Period  Weeks    Status  On-going    Target Date  05/03/19      PT LONG TERM GOAL #3   Title  Patient will be require no assist with ascend/descend 3 steps using Least restrictive assistive device to improve safety with entry/exit home.     Baseline  12/16/2018: ascends and descends stairs with RUE rail assist.    Time  8    Period  Weeks    Status  Partially Met    Target Date  05/03/19      PT LONG TERM GOAL #4   Title  Patient will increase BLE gross strength to 4+/5 as to improve functional strength for independent gait, increased standing tolerance and increased ADL ability.    Baseline  12/16/2018: BLE hip ext 3/5, BLE hip add 4-/5, BLE hip abd 4/5; On 1/5 all groups 4+/5 or stronger excepts ABDCT adn extension not assessed.    Time  8    Period  Weeks    Status  Partially Met    Target Date  05/03/19      PT LONG TERM GOAL #5   Title  Patient will increase Berg Balance score to >52/56 to demonstrate decreased fall risk during functional activities.    Baseline  12/16/2018: 48/56;    Time  8    Period  Weeks    Status  On-going    Target Date  05/03/19      PT LONG TERM GOAL #6   Title  Patient will be able to stand on one leg for >5 sec to indicate a decreased fall risk and increase ADL/IADL ability.     Baseline  12/16/2018: Unable to hold for more than 3 seconds, but able to remain standing. 02/09/19: 3-5 sec each LE    Time  8    Period  Weeks    Status  On-going    Target Date  05/03/19      PT LONG TERM GOAL #7   Title  patient will increase Mini Best test score to >18/28 to exhibit improved balance and reduce fall risk.    Time  4    Period  Weeks    Status  Achieved      PT LONG TERM GOAL #8   Title  Patient will improve cardiovascular endurance as evidenced by increasing 6 min walk test to > 450 feet to exhibit a significant improvement in gait ability for shopping and running errands.    Baseline  12/9: 365 feet; 1/5 AMB c 4WW with quick onset fatigue of LLE and loss heelstrike    Time  8    Period  Weeks    Status  On-going    Target Date  05/03/19            Plan - 04/05/19 1201    Clinical Impression Statement  Patient motivated and participated well within session. instructed patient in advanced LE strengthening. She does require min VCs for correct exercise technique and positioning. Patient also requires increased time for correct motor planning and exercise technique. She continues to have mild left lateral lean during balance exercise but is able to self correct with verbal cues.  She would benefit from additional skilled PT intervention to improve strength, balance and gait safety;    Rehab Potential  Good    PT Frequency  2x / week    PT Duration  8 weeks    PT Treatment/Interventions  Cryotherapy;Electrical Stimulation;Moist Heat;Gait training;Stair training;Functional mobility training;Therapeutic activities;Therapeutic exercise;Balance training;Neuromuscular re-education;Patient/family education;Energy conservation    PT Next Visit Plan  progress isolated hip strengthening regimine and gai ttraining.    PT Home Exercise Plan  Needs an update, but none this session.    Consulted and Agree with Plan of Care  Patient       Patient will benefit from skilled  therapeutic intervention in order to improve the following deficits and impairments:  Abnormal gait, Decreased balance, Decreased endurance, Decreased mobility, Difficulty walking, Impaired perceived functional ability, Decreased activity tolerance, Decreased safety awareness  Visit Diagnosis: Muscle weakness (generalized)  Difficulty in walking, not elsewhere classified  Unsteadiness on feet     Problem List Patient Active Problem List   Diagnosis Date Noted  . NPH (normal pressure hydrocephalus) ([place]) 07/30/2016  . Left leg weakness 04/20/2016  . Menopausal osteoporosis 03/04/2016  . Abnormal gait 12/18/2015  . Left foot drop 12/18/2015  . BP (high blood pressure) 08/24/2015  . Hydronephrosis, left 07/28/2015  . UPJ (ureteropelvic junction) obstruction 07/28/2015  . Lower urinary tract infectious disease 07/14/2015  . Hydronephrosis 07/14/2015    Trotter,Margaret PT, DPT 04/05/2019, 12:56 PM  [place] MAIN [facility] SERVICES [address] [place], [place], [address] Phone: [phone]   Fax:  [phone]  Name: NAIKA NOTO MRN: [id number] Date of Birth: [birth year]

## 2019-04-07 ENCOUNTER — Ambulatory Visit: Payer: PPO | Admitting: Physical Therapy

## 2019-04-07 ENCOUNTER — Encounter: Payer: Self-pay | Admitting: Physical Therapy

## 2019-04-07 ENCOUNTER — Other Ambulatory Visit: Payer: Self-pay

## 2019-04-07 DIAGNOSIS — R262 Difficulty in walking, not elsewhere classified: Secondary | ICD-10-CM

## 2019-04-07 DIAGNOSIS — M6281 Muscle weakness (generalized): Secondary | ICD-10-CM

## 2019-04-07 DIAGNOSIS — R2681 Unsteadiness on feet: Secondary | ICD-10-CM

## 2019-04-07 NOTE — Therapy (Signed)
[place] MAIN [facility] SERVICES [address] [place], [place], [address] Phone: [phone]   Fax:  [phone]  Physical Therapy Treatment  Patient Details  Name: Abigail Hill MRN: [id number] Date of Birth: 10-Apr-1950 Referring Provider (PT): Dr. Metta Clines   Encounter Date: 04/07/2019  PT End of Session - 04/07/19 1151    Visit Number  32    Number of Visits  92    Date for PT Re-Evaluation  05/03/19    Authorization Type  goals addressed 12/16/2018    Authorization Time Period  03/08/19-05/03/19    PT Start Time  1146    PT Stop Time  1230    PT Time Calculation (min)  44 min    Equipment Utilized During Treatment  Gait belt    Activity Tolerance  Patient tolerated treatment well;No increased pain;Patient limited by fatigue    Behavior During Therapy  [facility] for tasks assessed/performed       Past Medical History:  Diagnosis Date  . Abnormal gait 12/18/2015  . BP (high blood pressure) 08/24/2015  . Cancer ([place])    skin  . Constriction of ureter (postoperative) 07/28/2015  . Hydronephrosis 07/14/2015  . Hypertension   . Left foot drop 12/18/2015  . Recurrent UTI   . UPJ (ureteropelvic junction) obstruction 07/28/2015  . UTI (lower urinary tract infection) 07/14/2015    Past Surgical History:  Procedure Laterality Date  . broken foot  2012  . COLONOSCOPY WITH PROPOFOL N/A 06/30/2017   Procedure: COLONOSCOPY WITH PROPOFOL;  Surgeon: Lollie Sails, MD;  Location: [facility] ENDOSCOPY;  Service: Endoscopy;  Laterality: N/A;  . kidney repair  1985   repaired torn blood vessel in kidney    There were no vitals filed for this visit.  Subjective Assessment - 04/07/19 1151    Subjective  Patient reports doing well; Denies any pain. Denies any new falls. Reports mild soreness after last session but states that it was better after tylenol.    Pertinent History  69 yo Female reports instability with gait with difficulty walking. She states, "my left  leg gives me trouble. It will  just plant and then I can't pick it up." Pt reports 2 falls in last month. She reports when she falls it is often falling backwards; She was diagnosed with hydrocephalus and did a drain trial with no relief; She did not qualify for a shunt. Repeat imaging showed continued hydrocephalus that has not worsened; MRI of lumbar spine shows L5 pars defect with sacral insufficiency; She denies any back pain and reports no numbness in legs. She reports feeling frustration because they can't seem to find what's wrong with her. She does have urinary incontience; reports knowing when she has to go to the bathroom but can't get there fast enough; Patient has a walker and rollator; She uses both; She also has a manual wheelchair that she uses intermittently;     Currently in Pain?  No/denies    Pain Onset  Today           TREATMENT: Warm up on cross trainer BUE/BLE level 0 x4 min (unbilled)  Exercise: Standing with green tband around BLE: -hip abduction 2x15 -hip extension 2x15 Side stepping with green tband around BLE x10 feet x2 laps each direction with intermittent fingertip hold.  Patient required min VCs for proper positioning and to avoid trunk lean with advanced tband exercise. She also required min VCs to avoid hip ER when side stepping for  improved hip abductor strengthening;    Standing in parallel bars: Hip flexion march against green tband x15 reps bilaterally with min VCs to increase hip flexor ROM and improve erect posture for better ROM and strengthening;    Forward lunges in parallel bars x10 reps each LE with 2 rail assist with min VCs to increase step length for better positioning and quad activation;    Sit<>stand from chair with arms across chest x10 reps with min VCs for forward weight shift for better transfer ability;   NMR:   Resisted walking, 12.5# forward/backward, x2 laps each direction with min-mod A for safety and cues for increased step length  and improve weight shift for dynamic balance control;      Response to treatment: Tolerated fair. She did require min Vcs for correct exercise technique for optimal strengthening and muscle control. Patient did start to get short of breath, vitals assessed, SPo2 initially in mid 60's and low 70's; concerned of accuracy; Utilized another pulse ox and multiple fingers with same results. HR in high 90's. Patient's O2 levels did recover to mid 90's with prolonged sitting. Will continue to monitor. Reinforced HEP;                      PT Education - 04/07/19 1151    Education Details  LE strength, balance/HEP    Person(s) Educated  Patient    Methods  Explanation;Verbal cues    Comprehension  Verbalized understanding;Returned demonstration;Verbal cues required;Need further instruction       PT Short Term Goals - 02/09/19 1154      PT SHORT TERM GOAL #1   Title  Patient will be adherent to HEP at least 3x a week to improve functional strength and balance for better safety at home.    Baseline  08/12/18- doing exercise daily; 02/09/19: doing them 3-4 times a week;    Time  4    Period  Weeks    Status  Achieved    Target Date  08/17/18      PT SHORT TERM GOAL #2   Title   Patient will deny any falls over past 4 weeks to demonstrate improved safety awareness at home and work.     Baseline  08/12/18- fell 1 week ago; 09/23/18: none recent;    Time  4    Period  Weeks    Status  Achieved    Target Date  08/17/18        PT Long Term Goals - 03/15/19 1131      PT LONG TERM GOAL #1   Title  Patient will increase Berg Balance score by > 6 points to demonstrate decreased fall risk during functional activities.    Time  8    Period  Weeks    Status  Achieved      PT LONG TERM GOAL #2   Title  Patient will increase 10 meter walk test to >0.8 m/s as to improve gait speed for better community ambulation and to reduce fall risk.    Baseline  12/16/2018: 0.47 m/s; 02/09/19: 0.76  m/s    Time  8    Period  Weeks    Status  On-going    Target Date  05/03/19      PT LONG TERM GOAL #3   Title  Patient will be require no assist with ascend/descend 3 steps using Least restrictive assistive device to improve safety with entry/exit home.  Baseline  12/16/2018: ascends and descends stairs with RUE rail assist.    Time  8    Period  Weeks    Status  Partially Met    Target Date  05/03/19      PT LONG TERM GOAL #4   Title  Patient will increase BLE gross strength to 4+/5 as to improve functional strength for independent gait, increased standing tolerance and increased ADL ability.    Baseline  12/16/2018: BLE hip ext 3/5, BLE hip add 4-/5, BLE hip abd 4/5; On 1/5 all groups 4+/5 or stronger excepts ABDCT adn extension not assessed.    Time  8    Period  Weeks    Status  Partially Met    Target Date  05/03/19      PT LONG TERM GOAL #5   Title  Patient will increase Berg Balance score to >52/56 to demonstrate decreased fall risk during functional activities.    Baseline  12/16/2018: 48/56;    Time  8    Period  Weeks    Status  On-going    Target Date  05/03/19      PT LONG TERM GOAL #6   Title  Patient will be able to stand on one leg for >5 sec to indicate a decreased fall risk and increase ADL/IADL ability.    Baseline  12/16/2018: Unable to hold for more than 3 seconds, but able to remain standing. 02/09/19: 3-5 sec each LE    Time  8    Period  Weeks    Status  On-going    Target Date  05/03/19      PT LONG TERM GOAL #7   Title  patient will increase Mini Best test score to >18/28 to exhibit improved balance and reduce fall risk.    Time  4    Period  Weeks    Status  Achieved      PT LONG TERM GOAL #8   Title  Patient will improve cardiovascular endurance as evidenced by increasing 6 min walk test to > 450 feet to exhibit a significant improvement in gait ability for shopping and running errands.    Baseline  12/9: 365 feet; 1/5 AMB c 4WW with quick  onset fatigue of LLE and loss heelstrike    Time  8    Period  Weeks    Status  On-going    Target Date  05/03/19            Plan - 04/07/19 1159    Clinical Impression Statement  Patient motivated and participated well within session. Progressed LE strengthening with increased repetition and resistance. She required min VCs for correct exercise technique including to improve erect posture and weight shift. Patient required min-mod A with resisted walking due to imbalance.She does fatigue with prolonged standing requiring short seated rest breaks. She will exhibit increased flexed posture and short step length with increased fatigue. Patient would benefit from additional skilled PT intervention to improve strength, balance and gait safety;    Rehab Potential  Good    PT Frequency  2x / week    PT Duration  8 weeks    PT Treatment/Interventions  Cryotherapy;Electrical Stimulation;Moist Heat;Gait training;Stair training;Functional mobility training;Therapeutic activities;Therapeutic exercise;Balance training;Neuromuscular re-education;Patient/family education;Energy conservation    PT Next Visit Plan  progress isolated hip strengthening regimine and gai ttraining.    PT Home Exercise Plan  Needs an update, but none this session.    Consulted and Agree with  Plan of Care  Patient       Patient will benefit from skilled therapeutic intervention in order to improve the following deficits and impairments:  Abnormal gait, Decreased balance, Decreased endurance, Decreased mobility, Difficulty walking, Impaired perceived functional ability, Decreased activity tolerance, Decreased safety awareness  Visit Diagnosis: Muscle weakness (generalized)  Difficulty in walking, not elsewhere classified  Unsteadiness on feet     Problem List Patient Active Problem List   Diagnosis Date Noted  . NPH (normal pressure hydrocephalus) ([place]) 07/30/2016  . Left leg weakness 04/20/2016  . Menopausal  osteoporosis 03/04/2016  . Abnormal gait 12/18/2015  . Left foot drop 12/18/2015  . BP (high blood pressure) 08/24/2015  . Hydronephrosis, left 07/28/2015  . UPJ (ureteropelvic junction) obstruction 07/28/2015  . Lower urinary tract infectious disease 07/14/2015  . Hydronephrosis 07/14/2015    Kimyatta Lecy PT, DPT 04/07/2019, 12:58 PM  [place] MAIN [facility] SERVICES [address] [place], [place], [address] Phone: [phone]   Fax:  [phone]  Name: Abigail Hill MRN: [id number] Date of Birth: [birth year]

## 2019-04-08 DIAGNOSIS — Z114 Encounter for screening for human immunodeficiency virus [HIV]: Secondary | ICD-10-CM | POA: Diagnosis not present

## 2019-04-08 DIAGNOSIS — Z7983 Long term (current) use of bisphosphonates: Secondary | ICD-10-CM | POA: Diagnosis not present

## 2019-04-08 DIAGNOSIS — M5136 Other intervertebral disc degeneration, lumbar region: Secondary | ICD-10-CM | POA: Diagnosis not present

## 2019-04-08 DIAGNOSIS — G20 Parkinson's disease: Secondary | ICD-10-CM | POA: Diagnosis not present

## 2019-04-08 DIAGNOSIS — R32 Unspecified urinary incontinence: Secondary | ICD-10-CM | POA: Diagnosis not present

## 2019-04-08 DIAGNOSIS — Z885 Allergy status to narcotic agent status: Secondary | ICD-10-CM | POA: Diagnosis not present

## 2019-04-08 DIAGNOSIS — R2681 Unsteadiness on feet: Secondary | ICD-10-CM | POA: Diagnosis not present

## 2019-04-08 DIAGNOSIS — G919 Hydrocephalus, unspecified: Secondary | ICD-10-CM | POA: Diagnosis not present

## 2019-04-08 DIAGNOSIS — M47819 Spondylosis without myelopathy or radiculopathy, site unspecified: Secondary | ICD-10-CM | POA: Diagnosis not present

## 2019-04-08 DIAGNOSIS — R269 Unspecified abnormalities of gait and mobility: Secondary | ICD-10-CM | POA: Diagnosis not present

## 2019-04-08 DIAGNOSIS — M4802 Spinal stenosis, cervical region: Secondary | ICD-10-CM | POA: Diagnosis not present

## 2019-04-08 DIAGNOSIS — Z79899 Other long term (current) drug therapy: Secondary | ICD-10-CM | POA: Diagnosis not present

## 2019-04-12 ENCOUNTER — Ambulatory Visit: Payer: PPO | Admitting: Physical Therapy

## 2019-04-14 ENCOUNTER — Ambulatory Visit: Payer: PPO | Attending: Internal Medicine

## 2019-04-14 ENCOUNTER — Ambulatory Visit: Payer: PPO | Admitting: Physical Therapy

## 2019-04-14 DIAGNOSIS — Z23 Encounter for immunization: Secondary | ICD-10-CM | POA: Insufficient documentation

## 2019-04-14 NOTE — Progress Notes (Signed)
   Covid-19 Vaccination Clinic  Name:  Abigail Hill    MRN: [id number] DOB: 1950/10/19  04/14/2019  [name] was observed post Covid-19 immunization for 15 minutes without incidence. She was provided with Vaccine Information Sheet and instruction to access the V-Safe system.   [name] was instructed to call 911 with any severe reactions post vaccine: Marland Kitchen Difficulty breathing  . Swelling of your face and throat  . A fast heartbeat  . A bad rash all over your body  . Dizziness and weakness    Immunizations Administered    Name Date Dose VIS Date Route   Pfizer COVID-19 Vaccine 04/14/2019  2:44 PM 0.3 mL 02/11/2019 Intramuscular   Manufacturer: [place]   Lot: [id number]   [place]: [id number]

## 2019-04-18 ENCOUNTER — Encounter: Payer: Self-pay | Admitting: Physical Therapy

## 2019-04-18 NOTE — Therapy (Signed)
[place] MAIN [facility] SERVICES [address] [place], [place], [address] Phone: [phone]   Fax:  [phone]  April 18, 2019   No Recipients  Physical Therapy Discharge Summary  Patient: Abigail Hill  MRN: [id number]  Date of Birth: [birth year]   Diagnosis: No diagnosis found. Referring Provider (PT): Dr. Metta Clines   The above patient had been seen in Physical Therapy 63 times of 65treatments scheduled with 0 no shows and 2 cancellations.  The treatment consisted of strengthening, balance, gait training;  The patient is: Unchanged  Subjective: patient called and cancelled remaining appointment requesting to be discharged as she is going to follow up with the movement clinic at this time.  At patient's last therapy appointment, it was discovered that her SPO2 levels were dropping significantly with prolonged standing/activity. (mid 70's) and would recover to >95% with seated rest break. Unable to reassess as patient cancelled remaining appointments. Recommend patient have SPo2 levels checked with activity for safety.    Goals Partially Met    Sincerely,   Derric Dealmeida, PT DPT   CC No Recipients  [place] MAIN [facility] SERVICES [address] [place], [place], [address] Phone: [phone]   Fax:  [phone]  Patient: Abigail Hill  MRN: [id number]  Date of Birth: 02/18/1951

## 2019-04-19 ENCOUNTER — Ambulatory Visit: Payer: PPO | Admitting: Physical Therapy

## 2019-04-21 ENCOUNTER — Ambulatory Visit: Payer: PPO | Admitting: Physical Therapy

## 2019-04-21 NOTE — Progress Notes (Signed)
NEUROLOGY FOLLOW UP OFFICE NOTE  [name] [id number]  HISTORY OF PRESENT ILLNESS: Abigail Hill is a 69 year old right-handed Caucasian woman who follows up for unsteady gait. She is accompanied by her husband and daughter who supplement history.  UPDATE: She was evaluated by Dr. Zella Ball and Dr. Nicoletta Dress at [facility] on 04/08/2019.  MRI of cervical and thoracic spine were ordered to evaluate for myelopathy which is scheduled for Friday, as well as labs checking B12, vitamin D, HTLV, HIV, syphilis and copper, which are all normal.  She was restarted on carbidopa-levodopa trial.    HISTORY: Since 2015-2016, she reports problems with balance and gait. She feels like she leans towards the left and tends to drag her left leg. There is no associated low back pain, radicular pain, or numbness. Over time, she started having increased falls. Around the same time, she started having urinary incontinence. It would occur while trying to reach the bathroom. She denied bowel dysfunction. She saw a neurologist in early 2018 and was found to have a foot drop, which was initially trated with an AFO and physical therapy. A NCV-EMG was ordered but never performed. She subsequently had an MRI of the brain without contrast on 05/15/16, which was personally reviewed, which showed chronic hydrocephalus as demonstrated by moderate lateral and third ventriculomegaly with transependymal flow, slightly worse compared to a prior head CT from 04/29/09. MRI of lumbar spine from 06/19/16 showed sacral insufficiency fractures involving the left ala and S3 body and L5 chronic pars defect with disc degeneration but no nerve root impingement. She was seen by neurosurgery on 07/03/16, who felt that her symptoms were related to NPH rather than lumbosacral etiology and she was referred for a lumbar drain trial and consideration of VP shunt. She was admitted to [facility] in August 2018 for lumbar drain trial which was  unsuccessful.X-rays of the hip were unremarkable.MRI of cervical spine from 06/28/17 showed multilevel degenerative changes with mild spinal stenosis from C3-4 to C5-6 without abnormal cord signal or significant mass effect. She underwent continued workup for abnormal gait: NCV-EMG of the lower extremities from 10/20/17 was normal. MRI of brain with and without contrast from 11/09/17 demonstrated chronic communicating hydrocephalus pattern, unchanged since March 2018.She was then started on carbidopa-levodopa trial for possible Parkinson's disease.DaTscan was performed on 03/11/18, which was normal. Carbidopa-levodopa was ineffective and she says she feels betteroff of it.  PAST MEDICAL HISTORY: Past Medical History:  Diagnosis Date  . Abnormal gait 12/18/2015  . BP (high blood pressure) 08/24/2015  . Cancer ([place])    skin  . Constriction of ureter (postoperative) 07/28/2015  . Hydronephrosis 07/14/2015  . Hypertension   . Left foot drop 12/18/2015  . Recurrent UTI   . UPJ (ureteropelvic junction) obstruction 07/28/2015  . UTI (lower urinary tract infection) 07/14/2015    MEDICATIONS: Current Outpatient Medications on File Prior to Visit  Medication Sig Dispense Refill  . alendronate (FOSAMAX) 70 MG tablet     . aspirin 81 MG chewable tablet Chew 81 mg by mouth daily.    . Calcium Carb-Cholecalciferol (OYSTER SHELL CALCIUM) 500-400 MG-UNIT TABS Take by mouth.    . Calcium Carbonate-Vit D-Min (CALTRATE PLUS PO) Take by mouth.    . calcium-vitamin D (OSCAL WITH D) 250-125 MG-UNIT tablet Take by mouth.    . carbidopa-levodopa (SINEMET IR) 25-100 MG tablet Take 0.5 tablet three times a day for 2 weeks, then increase it to 1 tablet three times a day    .  carbidopa-levodopa (SINEMET IR) 25-100 MG tablet Take 1 tablet by mouth 3 (three) times daily.    . hydrochlorothiazide (HYDRODIURIL) 12.5 MG tablet Take by mouth.    . Multiple Vitamins-Minerals (CENTRUM SILVER PO) Take by mouth.    Marland Kitchen  MYRBETRIQ 25 MG TB24 tablet TAKE 1 TABLET BY MOUTH EVERY DAY 30 tablet 6  . nystatin-triamcinolone ointment (MYCOLOG) Apply 1 application topically 2 (two) times daily. 30 g 0   No current facility-administered medications on file prior to visit.    ALLERGIES: Allergies  Allergen Reactions  . Oxycodone     FAMILY HISTORY: Family History  Problem Relation Age of Onset  . Kidney disease Neg Hx   . Bladder Cancer Neg Hx     SOCIAL HISTORY: Social History   Socioeconomic History  . Marital status: Married    Spouse name: [name]  . Number of children: 2  . Years of education: Not on file  . Highest education level: Not on file  Occupational History  . Occupation: retired  Tobacco Use  . Smoking status: Former Research scientist (life sciences)  . Smokeless tobacco: Never Used  . Tobacco comment: quit 10 years  Substance and Sexual Activity  . Alcohol use: Never    Alcohol/week: 0.0 standard drinks  . Drug use: No  . Sexual activity: Yes  Other Topics Concern  . Not on file  Social History Narrative   Patient is right-handed. She lives with husband in a one level home.   Social Determinants of Health   Financial Resource Strain:   . Difficulty of Paying Living Expenses: Not on file  Food Insecurity:   . Worried About Charity fundraiser in the Last Year: Not on file  . Ran Out of Food in the Last Year: Not on file  Transportation Needs:   . Lack of Transportation (Medical): Not on file  . Lack of Transportation (Non-Medical): Not on file  Physical Activity:   . Days of Exercise per Week: Not on file  . Minutes of Exercise per Session: Not on file  Stress:   . Feeling of Stress : Not on file  Social Connections:   . Frequency of Communication with Friends and Family: Not on file  . Frequency of Social Gatherings with Friends and Family: Not on file  . Attends Religious Services: Not on file  . Active Member of Clubs or Organizations: Not on file  . Attends Archivist Meetings:  Not on file  . Marital Status: Not on file  Intimate Partner Violence:   . Fear of Current or Ex-Partner: Not on file  . Emotionally Abused: Not on file  . Physically Abused: Not on file  . Sexually Abused: Not on file   PHYSICAL EXAM: Blood pressure (!) 173/98, pulse 74, resp. rate 18, height 5\' 4"  (1.626 m), weight 166 lb (75.3 kg), SpO2 95 %. General: No acute distress.  Patient appears well-groomed.   Head:  Normocephalic/atraumatic Eyes:  Fundi examined but not visualized Neck: supple, no paraspinal tenderness, full range of motion Heart:  Regular rate and rhythm Lungs:  Clear to auscultation bilaterally Back: No paraspinal tenderness Neurological Exam: alert and oriented to person, place, and time. Attention span and concentration intact, recent and remote memory intact, fund of knowledge intact.  Speech fluent and not dysarthric, language intact.  CN II-XII intact. Bulk and tone normal, no bradykinesia, muscle strength 5/5 throughout.  Sensation to light touch intact.  Decreased vibration in toes.  Deep tendon reflexes 2+ throughout,  toes downgoing.  Finger to nose testing intact.  Wide-based gait.    IMPRESSION: 1.  Abnormal gait.  Unclear etiology.  She has upcoming MRI to evaluate for possible myelopathy.  Also consider primary freezing of gait.  2.  Elevated blood pressure  PLAN: 1.  Will have MRI of cervical and thoracic spine later this week and will follow up with [facility] 2.  Follow up with me in about 4 1/2 months. 3.  Follow up with PCP regarding blood pressure  Metta Clines, DO  CC: Harrel Lemon, MD

## 2019-04-25 ENCOUNTER — Ambulatory Visit (INDEPENDENT_AMBULATORY_CARE_PROVIDER_SITE_OTHER): Payer: PPO | Admitting: Neurology

## 2019-04-25 ENCOUNTER — Other Ambulatory Visit: Payer: Self-pay

## 2019-04-25 ENCOUNTER — Encounter: Payer: Self-pay | Admitting: Neurology

## 2019-04-25 VITALS — BP 173/98 | HR 74 | Resp 18 | Ht 64.0 in | Wt 166.0 lb

## 2019-04-25 DIAGNOSIS — I10 Essential (primary) hypertension: Secondary | ICD-10-CM | POA: Diagnosis not present

## 2019-04-25 DIAGNOSIS — R2681 Unsteadiness on feet: Secondary | ICD-10-CM

## 2019-04-25 NOTE — Patient Instructions (Signed)
Follow up at [facility] and then follow up with me (in about 4 1/2 months)

## 2019-04-26 ENCOUNTER — Ambulatory Visit: Payer: PPO | Admitting: Physical Therapy

## 2019-04-28 ENCOUNTER — Ambulatory Visit: Payer: PPO | Admitting: Physical Therapy

## 2019-04-29 DIAGNOSIS — M40204 Unspecified kyphosis, thoracic region: Secondary | ICD-10-CM | POA: Diagnosis not present

## 2019-04-29 DIAGNOSIS — R2681 Unsteadiness on feet: Secondary | ICD-10-CM | POA: Diagnosis not present

## 2019-04-29 DIAGNOSIS — M47812 Spondylosis without myelopathy or radiculopathy, cervical region: Secondary | ICD-10-CM | POA: Diagnosis not present

## 2019-04-29 DIAGNOSIS — G950 Syringomyelia and syringobulbia: Secondary | ICD-10-CM | POA: Diagnosis not present

## 2019-04-29 DIAGNOSIS — M4802 Spinal stenosis, cervical region: Secondary | ICD-10-CM | POA: Diagnosis not present

## 2019-05-02 ENCOUNTER — Ambulatory Visit: Payer: PPO | Admitting: Physical Therapy

## 2019-05-04 DIAGNOSIS — R300 Dysuria: Secondary | ICD-10-CM | POA: Diagnosis not present

## 2019-05-05 ENCOUNTER — Ambulatory Visit: Payer: PPO | Admitting: Physical Therapy

## 2019-05-07 ENCOUNTER — Ambulatory Visit: Payer: PPO | Attending: Internal Medicine

## 2019-05-07 DIAGNOSIS — Z23 Encounter for immunization: Secondary | ICD-10-CM | POA: Insufficient documentation

## 2019-05-07 NOTE — Progress Notes (Signed)
   Covid-19 Vaccination Clinic  Name:  ISADORE KAMRATH    MRN: [id number] DOB: 10/21/1950  05/07/2019  Ms. Picardi was observed post Covid-19 immunization for 15 minutes without incident. She was provided with Vaccine Information Sheet and instruction to access the V-Safe system.   Ms. Ryberg was instructed to call 911 with any severe reactions post vaccine: Marland Kitchen Difficulty breathing  . Swelling of face and throat  . A fast heartbeat  . A bad rash all over body  . Dizziness and weakness   Immunizations Administered    Name Date Dose VIS Date Route   Pfizer COVID-19 Vaccine 05/07/2019 12:53 PM 0.3 mL 02/11/2019 Intramuscular   Manufacturer: [place]   Lot: [id number]   [place]: [id number]

## 2019-05-09 ENCOUNTER — Ambulatory Visit: Payer: PPO | Admitting: Physical Therapy

## 2019-05-12 ENCOUNTER — Ambulatory Visit: Payer: PPO | Admitting: Physical Therapy

## 2019-05-16 ENCOUNTER — Emergency Department: Payer: PPO

## 2019-05-16 ENCOUNTER — Inpatient Hospital Stay
Admission: EM | Admit: 2019-05-16 | Discharge: 2019-05-19 | DRG: 291 | Disposition: A | Discharge status: Home Health | Payer: PPO | Source: Ambulatory Visit | Attending: Internal Medicine | Admitting: Internal Medicine

## 2019-05-16 ENCOUNTER — Encounter: Payer: Self-pay | Admitting: Emergency Medicine

## 2019-05-16 ENCOUNTER — Other Ambulatory Visit: Payer: Self-pay

## 2019-05-16 DIAGNOSIS — Z87891 Personal history of nicotine dependence: Secondary | ICD-10-CM | POA: Diagnosis not present

## 2019-05-16 DIAGNOSIS — Z7982 Long term (current) use of aspirin: Secondary | ICD-10-CM | POA: Diagnosis not present

## 2019-05-16 DIAGNOSIS — Z8673 Personal history of transient ischemic attack (TIA), and cerebral infarction without residual deficits: Secondary | ICD-10-CM | POA: Diagnosis not present

## 2019-05-16 DIAGNOSIS — J9621 Acute and chronic respiratory failure with hypoxia: Secondary | ICD-10-CM | POA: Diagnosis present

## 2019-05-16 DIAGNOSIS — Z79899 Other long term (current) drug therapy: Secondary | ICD-10-CM

## 2019-05-16 DIAGNOSIS — I5033 Acute on chronic diastolic (congestive) heart failure: Secondary | ICD-10-CM | POA: Diagnosis present

## 2019-05-16 DIAGNOSIS — J9611 Chronic respiratory failure with hypoxia: Secondary | ICD-10-CM | POA: Diagnosis present

## 2019-05-16 DIAGNOSIS — R5383 Other fatigue: Secondary | ICD-10-CM | POA: Diagnosis not present

## 2019-05-16 DIAGNOSIS — J9601 Acute respiratory failure with hypoxia: Secondary | ICD-10-CM | POA: Diagnosis not present

## 2019-05-16 DIAGNOSIS — G910 Communicating hydrocephalus: Secondary | ICD-10-CM | POA: Diagnosis present

## 2019-05-16 DIAGNOSIS — Z8701 Personal history of pneumonia (recurrent): Secondary | ICD-10-CM

## 2019-05-16 DIAGNOSIS — I10 Essential (primary) hypertension: Secondary | ICD-10-CM

## 2019-05-16 DIAGNOSIS — I517 Cardiomegaly: Secondary | ICD-10-CM | POA: Diagnosis not present

## 2019-05-16 DIAGNOSIS — Z66 Do not resuscitate: Secondary | ICD-10-CM | POA: Diagnosis present

## 2019-05-16 DIAGNOSIS — R4182 Altered mental status, unspecified: Secondary | ICD-10-CM | POA: Diagnosis not present

## 2019-05-16 DIAGNOSIS — M810 Age-related osteoporosis without current pathological fracture: Secondary | ICD-10-CM | POA: Diagnosis present

## 2019-05-16 DIAGNOSIS — J449 Chronic obstructive pulmonary disease, unspecified: Secondary | ICD-10-CM | POA: Diagnosis not present

## 2019-05-16 DIAGNOSIS — I110 Hypertensive heart disease with heart failure: Secondary | ICD-10-CM | POA: Diagnosis not present

## 2019-05-16 DIAGNOSIS — G919 Hydrocephalus, unspecified: Secondary | ICD-10-CM | POA: Diagnosis not present

## 2019-05-16 DIAGNOSIS — R0602 Shortness of breath: Secondary | ICD-10-CM | POA: Diagnosis not present

## 2019-05-16 DIAGNOSIS — R2681 Unsteadiness on feet: Secondary | ICD-10-CM | POA: Diagnosis not present

## 2019-05-16 DIAGNOSIS — J439 Emphysema, unspecified: Secondary | ICD-10-CM | POA: Diagnosis present

## 2019-05-16 DIAGNOSIS — I2510 Atherosclerotic heart disease of native coronary artery without angina pectoris: Secondary | ICD-10-CM | POA: Diagnosis not present

## 2019-05-16 DIAGNOSIS — Z136 Encounter for screening for cardiovascular disorders: Secondary | ICD-10-CM | POA: Diagnosis not present

## 2019-05-16 DIAGNOSIS — J438 Other emphysema: Secondary | ICD-10-CM | POA: Diagnosis not present

## 2019-05-16 DIAGNOSIS — J441 Chronic obstructive pulmonary disease with (acute) exacerbation: Secondary | ICD-10-CM | POA: Diagnosis not present

## 2019-05-16 DIAGNOSIS — Z20822 Contact with and (suspected) exposure to covid-19: Secondary | ICD-10-CM | POA: Diagnosis present

## 2019-05-16 DIAGNOSIS — R600 Localized edema: Secondary | ICD-10-CM | POA: Diagnosis not present

## 2019-05-16 LAB — TROPONIN I (HIGH SENSITIVITY)
Troponin I (High Sensitivity): 8 ng/L (ref <18)
Troponin I (High Sensitivity): 8 ng/L (ref <18)

## 2019-05-16 LAB — CBC WITH DIFFERENTIAL/PLATELET
Abs Immature Granulocytes: 0.04 10*3/uL (ref 0.00–0.07)
Basophils Absolute: 0 10*3/uL (ref 0.0–0.1)
Basophils Relative: 0 %
Eosinophils Absolute: 0.1 10*3/uL (ref 0.0–0.5)
Eosinophils Relative: 1 %
HCT: 48.1 % — ABNORMAL HIGH (ref 36.0–46.0)
Hemoglobin: 15.5 g/dL — ABNORMAL HIGH (ref 12.0–15.0)
Immature Granulocytes: 1 %
Lymphocytes Relative: 9 %
Lymphs Abs: 0.7 10*3/uL (ref 0.7–4.0)
MCH: 32.1 pg (ref 26.0–34.0)
MCHC: 32.2 g/dL (ref 30.0–36.0)
MCV: 99.6 fL (ref 80.0–100.0)
Monocytes Absolute: 0.7 10*3/uL (ref 0.1–1.0)
Monocytes Relative: 8 %
Neutro Abs: 6.3 10*3/uL (ref 1.7–7.7)
Neutrophils Relative %: 81 %
Platelets: 188 10*3/uL (ref 150–400)
RBC: 4.83 MIL/uL (ref 3.87–5.11)
RDW: 14.6 % (ref 11.5–15.5)
WBC: 7.8 10*3/uL (ref 4.0–10.5)
nRBC: 0.3 % — ABNORMAL HIGH (ref 0.0–0.2)

## 2019-05-16 LAB — COMPREHENSIVE METABOLIC PANEL
ALT: 31 U/L (ref 0–44)
AST: 20 U/L (ref 15–41)
Albumin: 4.6 g/dL (ref 3.5–5.0)
Alkaline Phosphatase: 87 U/L (ref 38–126)
Anion gap: 9 (ref 5–15)
BUN: 20 mg/dL (ref 8–23)
CO2: 38 mmol/L — ABNORMAL HIGH (ref 22–32)
Calcium: 10 mg/dL (ref 8.9–10.3)
Chloride: 86 mmol/L — ABNORMAL LOW (ref 98–111)
Creatinine, Ser: 0.72 mg/dL (ref 0.44–1.00)
GFR calc Af Amer: >60 mL/min (ref >60)
GFR calc non Af Amer: >60 mL/min (ref >60)
Glucose, Bld: 118 mg/dL — ABNORMAL HIGH (ref 70–99)
Potassium: 3.7 mmol/L (ref 3.5–5.1)
Sodium: 133 mmol/L — ABNORMAL LOW (ref 135–145)
Total Bilirubin: 0.9 mg/dL (ref 0.3–1.2)
Total Protein: 8 g/dL (ref 6.5–8.1)

## 2019-05-16 LAB — RESPIRATORY PANEL BY RT PCR (FLU A&B, COVID)
Influenza A by PCR: NEGATIVE
Influenza B by PCR: NEGATIVE
SARS Coronavirus 2 by RT PCR: NEGATIVE

## 2019-05-16 LAB — BRAIN NATRIURETIC PEPTIDE: B Natriuretic Peptide: 165 pg/mL — ABNORMAL HIGH (ref 0.0–100.0)

## 2019-05-16 LAB — PROTIME-INR
INR: 1 (ref 0.8–1.2)
Prothrombin Time: 12.6 seconds (ref 11.4–15.2)

## 2019-05-16 LAB — APTT: aPTT: 37 seconds — ABNORMAL HIGH (ref 24–36)

## 2019-05-16 MED ORDER — ENOXAPARIN SODIUM 40 MG/0.4ML ~~LOC~~ SOLN
40.0000 mg | SUBCUTANEOUS | Status: DC
  Administered 2019-05-16 – 2019-05-18 (×3): 40 mg via SUBCUTANEOUS
  Filled 2019-05-16 (×3): qty 0.4

## 2019-05-16 MED ORDER — METHYLPREDNISOLONE SODIUM SUCC 125 MG IJ SOLR
125.0000 mg | Freq: Once | INTRAMUSCULAR | Status: AC
  Administered 2019-05-16: 125 mg via INTRAVENOUS
  Filled 2019-05-16: qty 2

## 2019-05-16 MED ORDER — IPRATROPIUM-ALBUTEROL 0.5-2.5 (3) MG/3ML IN SOLN
3.0000 mL | Freq: Four times a day (QID) | RESPIRATORY_TRACT | Status: DC
  Administered 2019-05-17 – 2019-05-18 (×5): 3 mL via RESPIRATORY_TRACT
  Filled 2019-05-16 (×5): qty 3

## 2019-05-16 MED ORDER — METHYLPREDNISOLONE SODIUM SUCC 40 MG IJ SOLR
40.0000 mg | Freq: Every day | INTRAMUSCULAR | Status: DC
  Filled 2019-05-16: qty 1

## 2019-05-16 MED ORDER — IPRATROPIUM-ALBUTEROL 0.5-2.5 (3) MG/3ML IN SOLN
3.0000 mL | Freq: Once | RESPIRATORY_TRACT | Status: AC
  Administered 2019-05-16: 3 mL via RESPIRATORY_TRACT
  Filled 2019-05-16: qty 3

## 2019-05-16 MED ORDER — IPRATROPIUM-ALBUTEROL 0.5-2.5 (3) MG/3ML IN SOLN
3.0000 mL | Freq: Once | RESPIRATORY_TRACT | Status: AC
  Administered 2019-05-16: 18:00:00 3 mL via RESPIRATORY_TRACT
  Filled 2019-05-16: qty 3

## 2019-05-16 MED ORDER — IOHEXOL 350 MG/ML SOLN
75.0000 mL | Freq: Once | INTRAVENOUS | Status: AC | PRN
  Administered 2019-05-16: 75 mL via INTRAVENOUS

## 2019-05-16 MED ORDER — FUROSEMIDE 10 MG/ML IJ SOLN
40.0000 mg | Freq: Every day | INTRAMUSCULAR | Status: DC
  Administered 2019-05-16 – 2019-05-17 (×2): 40 mg via INTRAVENOUS
  Filled 2019-05-16 (×2): qty 4

## 2019-05-16 NOTE — ED Provider Notes (Signed)
[facility] Emergency Department Provider Note  ____________________________________________   First MD Initiated Contact with Patient 05/16/19 1504     (approximate)  I have reviewed the triage vital signs and the nursing notes.   HISTORY  Chief Complaint hypoxia    HPI Abigail Hill is a 69 y.o. female with history of hypertension, CVA, osteoporosis, prior pneumonia 12 years ago who came into the [facility] clinic for shortness of breath and was found to have O2 saturations of 79 to 80%.  Patient states that she has felt short of breath for the past 2 weeks.  Patient states that the shortness of breath is worse with walking and with leaning over.  It is intermittent, and nothing seems to make it better.  Patient has a remote history of smoking but stopped after she was diagnosed with pneumonia 12 years ago.  She does not normally require any oxygen.  She states that she has had some intermittent swelling to her legs but seem to gotten worse over the past week.  She denies any history of blood clot.  Her right leg is slightly larger than the left but she states that that is been going on for years.          Past Medical History:  Diagnosis Date  . Abnormal gait 12/18/2015  . BP (high blood pressure) 08/24/2015  . Cancer ([place])    skin  . Constriction of ureter (postoperative) 07/28/2015  . Hydronephrosis 07/14/2015  . Hypertension   . Left foot drop 12/18/2015  . Recurrent UTI   . UPJ (ureteropelvic junction) obstruction 07/28/2015  . UTI (lower urinary tract infection) 07/14/2015    Patient Active Problem List   Diagnosis Date Noted  . NPH (normal pressure hydrocephalus) ([place]) 07/30/2016  . Left leg weakness 04/20/2016  . Menopausal osteoporosis 03/04/2016  . Abnormal gait 12/18/2015  . Left foot drop 12/18/2015  . BP (high blood pressure) 08/24/2015  . Hydronephrosis, left 07/28/2015  . UPJ (ureteropelvic junction) obstruction 07/28/2015  . Lower  urinary tract infectious disease 07/14/2015  . Hydronephrosis 07/14/2015    Past Surgical History:  Procedure Laterality Date  . broken foot  2012  . COLONOSCOPY WITH PROPOFOL N/A 06/30/2017   Procedure: COLONOSCOPY WITH PROPOFOL;  Surgeon: Lollie Sails, MD;  Location: [facility] ENDOSCOPY;  Service: Endoscopy;  Laterality: N/A;  . kidney repair  1985   repaired torn blood vessel in kidney    Prior to Admission medications   Medication Sig Start Date End Date Taking? Authorizing Provider  alendronate (FOSAMAX) 70 MG tablet  06/17/16   [provider]  aspirin 81 MG chewable tablet Chew 81 mg by mouth daily.    [provider]  Calcium Carb-Cholecalciferol (OYSTER SHELL CALCIUM) 500-400 MG-UNIT TABS Take by mouth.    [provider]  Calcium Carbonate-Vit D-Min (CALTRATE PLUS PO) Take by mouth.    [provider]  calcium-vitamin D (OSCAL WITH D) 250-125 MG-UNIT tablet Take by mouth.    [provider]  carbidopa-levodopa (SINEMET IR) 25-100 MG tablet Take 0.5 tablet three times a day for 2 weeks, then increase it to 1 tablet three times a day 04/08/19   [provider]  carbidopa-levodopa (SINEMET IR) 25-100 MG tablet Take 1 tablet by mouth 3 (three) times daily. 04/08/19   [provider]  hydrochlorothiazide (HYDRODIURIL) 12.5 MG tablet Take by mouth. 03/23/19   [provider]  Multiple Vitamins-Minerals (CENTRUM SILVER PO) Take by mouth.  [provider]  MYRBETRIQ 25 MG TB24 tablet TAKE 1 TABLET BY MOUTH EVERY DAY 02/18/19   Hollice Espy, MD  nystatin-triamcinolone ointment [facility]) Apply 1 application topically 2 (two) times daily. 06/30/18   Hollice Espy, MD    Allergies Oxycodone  Family History  Problem Relation Age of Onset  . Kidney disease Neg Hx   . Bladder Cancer Neg Hx     Social History Social History   Tobacco Use  . Smoking status: Former Research scientist (life sciences)  . Smokeless tobacco: Never  Used  . Tobacco comment: quit 10 years  Substance Use Topics  . Alcohol use: Never    Alcohol/week: 0.0 standard drinks  . Drug use: No      Review of Systems Constitutional: No fever/chills Eyes: No visual changes. ENT: No sore throat. Cardiovascular: No chest pain, positive leg swelling Respiratory: Positive for SOB Gastrointestinal: No abdominal pain.  No nausea, no vomiting.  No diarrhea.  No constipation. Genitourinary: Negative for dysuria. Musculoskeletal: Negative for back pain. Skin: Negative for rash. Neurological: Negative for headaches, focal weakness or numbness. All other ROS negative ____________________________________________   PHYSICAL EXAM:  VITAL SIGNS: ED Triage Vitals  Enc Vitals Group     BP 05/16/19 1502 (!) 154/112     Pulse Rate 05/16/19 1500 93     Resp 05/16/19 1500 (!) 26     Temp 05/16/19 1500 98.8 F (37.1 C)     Temp Source 05/16/19 1500 Oral     SpO2 05/16/19 1452 (!) 69 %     Weight 05/16/19 1454 169 lb (76.7 kg)     Height 05/16/19 1454 5\' 4"  (1.626 m)     Head Circumference --      Peak Flow --      Pain Score 05/16/19 1459 0     Pain Loc --      Pain Edu? --      Excl. in [place]? --     Constitutional: Alert and oriented. Well appearing and in no acute distress. Eyes: Conjunctivae are normal. EOMI. Head: Atraumatic. Nose: No congestion/rhinnorhea. Mouth/Throat: Mucous membranes are moist.   Neck: No stridor. Trachea Midline. FROM Cardiovascular: Normal rate, regular rhythm. Grossly normal heart sounds.  Good peripheral circulation. Respiratory: Decreased breath sounds bilaterally, mild increased work of breathing, on 3 L Gastrointestinal: Soft and nontender. No distention. No abdominal bruits.  Musculoskeletal: 1+ edema bilaterally.  Right leg remains slightly larger than the left.  No joint effusions. Neurologic:  Normal speech and language. No gross focal neurologic deficits are appreciated.  Skin:  Skin is warm, dry and  intact. No rash noted. Psychiatric: Mood and affect are normal. Speech and behavior are normal. GU: Deferred   ____________________________________________   LABS (all labs ordered are listed, but only abnormal results are displayed)  Labs Reviewed  CBC WITH DIFFERENTIAL/PLATELET - Abnormal; Notable for the following components:      Result Value   Hemoglobin 15.5 (*)    HCT 48.1 (*)    nRBC 0.3 (*)    All other components within normal limits  COMPREHENSIVE METABOLIC PANEL - Abnormal; Notable for the following components:   Sodium 133 (*)    Chloride 86 (*)    CO2 38 (*)    Glucose, Bld 118 (*)    All other components within normal limits  BRAIN NATRIURETIC PEPTIDE - Abnormal; Notable for the following components:   B Natriuretic Peptide 165.0 (*)    All other components within normal limits  APTT -  Abnormal; Notable for the following components:   aPTT 37 (*)    All other components within normal limits  RESPIRATORY PANEL BY RT PCR (FLU A&B, COVID)  PROTIME-INR  TROPONIN I (HIGH SENSITIVITY)  TROPONIN I (HIGH SENSITIVITY)   ____________________________________________   ED ECG REPORT I, Vanessa [place], the attending physician, personally viewed and interpreted this ECG.  EKG is normal sinus rhythm 91, no ST elevation, no T wave inversions, normal intervals ____________________________________________  RADIOLOGY Robert Bellow, personally viewed and evaluated these images (plain radiographs) as part of my medical decision making, as well as reviewing the written report by the radiologist.  ED MD interpretation: Patient evaluated x-ray had no obvious pneumonia.  Will order CT angio which I also reviewed and I do not see any evidence of PE  Official radiology report(s): CT Angio Chest PE W and/or Wo Contrast  Result Date: 05/16/2019 CLINICAL DATA:  Short of breath. EXAM: CT ANGIOGRAPHY CHEST WITH CONTRAST TECHNIQUE: Multidetector CT imaging of the chest was performed  using the standard protocol during bolus administration of intravenous contrast. Multiplanar CT image reconstructions and MIPs were obtained to evaluate the vascular anatomy. CONTRAST:  41mL OMNIPAQUE IOHEXOL 350 MG/ML SOLN COMPARISON:  None. FINDINGS: Cardiovascular: The heart size appears mildly enlarged. Aortic atherosclerosis. Lad and RCA coronary artery calcifications. The main pulmonary artery appears patent. No central obstructing pulmonary emboli. No lobar or segmental pulmonary artery filling defects. Mediastinum/Nodes: No enlarged mediastinal, hilar, or axillary lymph nodes. Thyroid gland, trachea, and esophagus demonstrate no significant findings. Lungs/Pleura: Centrilobular and paraseptal emphysema. No airspace consolidation, atelectasis or pneumothorax. Upper Abdomen: No acute abnormality. Calcified granuloma identified within the spleen. Musculoskeletal: No chest wall abnormality. No acute or significant osseous findings. Review of the MIP images confirms the above findings. IMPRESSION: 1. No evidence for acute pulmonary embolus. 2. Emphysema and aortic atherosclerosis. 3. Multi vessel coronary artery calcifications. Aortic Atherosclerosis (ICD10-I70.0) and Emphysema (ICD10-J43.9). Electronically Signed   By: Kerby Moors M.D.   On: 05/16/2019 17:33   DG Chest Portable 1 View  Result Date: 05/16/2019 CLINICAL DATA:  Fatigue, malaise EXAM: PORTABLE CHEST 1 VIEW COMPARISON:  05/01/2009 FINDINGS: Normal mediastinum and cardiac silhouette. Chronic central bronchitic markings. Some peripheral nodularity in the RIGHT lung. No pneumothorax. No focal consolidation IMPRESSION: Chronic appearing bronchitic markings. Superimposed peripheral nodularity in the RIGHT lung. Recommend correlation for pulmonary infection and consider CT of the thorax or at minimal follow-up pulmonary radiograph. Electronically Signed   By: Suzy Bouchard M.D.   On: 05/16/2019 15:53     ____________________________________________   PROCEDURES  Procedure(s) performed (including Critical Care):  .Critical Care Performed by: Vanessa Friedens, MD Authorized by: Vanessa Albert Lea, MD   Critical care provider statement:    Critical care time (minutes):  35   Critical care was necessary to treat or prevent imminent or life-threatening deterioration of the following conditions:  Respiratory failure   Critical care was time spent personally by me on the following activities:  Discussions with consultants, evaluation of patient's response to treatment, examination of patient, ordering and performing treatments and interventions, ordering and review of laboratory studies, ordering and review of radiographic studies, pulse oximetry, re-evaluation of patient's condition, obtaining history from patient or surrogate and review of old charts     ____________________________________________   INITIAL IMPRESSION / [place] / ED COURSE   Abigail Hill was evaluated in Emergency Department on 05/16/2019 for the symptoms described in the history of present illness. She  was evaluated in the context of the global COVID-19 pandemic, which necessitated consideration that the patient might be at risk for infection with the SARS-CoV-2 virus that causes COVID-19. Institutional protocols and algorithms that pertain to the evaluation of patients at risk for COVID-19 are in a state of rapid change based on information released by regulatory bodies including the CDC and federal and state organizations. These policies and algorithms were followed during the patient's care in the ED.     Pt presents with SOB. Differential includes: CHF PNA-will get xray to evaluation Anemia-CBC to evaluate ACS- will get trops Arrhythmia-Will get EKG and keep on monitor.  COVID- will get testing per algorithm. PE-if x-ray and labs not revealing for cause of hypoxia will get CT PE   BNP is only slightly  elevated but no significant edema on chest x-ray.  Will proceed with CT PE.  Coronavirus testing was negative.  No evidence of anemia.  Troponin was also reassuring.  CT had no evidence of PE but does have some emphysema.  Will trial some duo nebs and steroids in case this could be related to her prior history of smoking.  Given patient is on 3 L of oxygen will discuss with the hospital team for admission     ____________________________________________   FINAL CLINICAL IMPRESSION(S) / ED DIAGNOSES   Final diagnoses:  Acute respiratory failure with hypoxia ([place])     MEDICATIONS GIVEN DURING THIS VISIT:  Medications  ipratropium-albuterol (DUONEB) 0.5-2.5 (3) MG/3ML nebulizer solution 3 mL (has no administration in time range)  ipratropium-albuterol (DUONEB) 0.5-2.5 (3) MG/3ML nebulizer solution 3 mL (has no administration in time range)  methylPREDNISolone sodium succinate (SOLU-MEDROL) 125 mg/2 mL injection 125 mg (has no administration in time range)  iohexol (OMNIPAQUE) 350 MG/ML injection 75 mL (75 mLs Intravenous Contrast Given 05/16/19 1710)  ipratropium-albuterol (DUONEB) 0.5-2.5 (3) MG/3ML nebulizer solution 3 mL (3 mLs Nebulization Given 05/16/19 1806)     ED Discharge Orders    None       Note:  This document was prepared using Dragon voice recognition software and may include unintentional dictation errors.   Vanessa , MD 05/16/19 [phone]

## 2019-05-16 NOTE — ED Triage Notes (Signed)
Decreased to 4 L

## 2019-05-16 NOTE — H&P (Signed)
History and Physical    Abigail Hill [id number] DOB: 1950-08-25 DOA: 05/16/2019  PCP: Baxter Hire, MD  Patient coming from: Home, lives with husband  I have personally briefly reviewed patient's old medical records in [place]  Chief Complaint: shortness of breath  HPI: Abigail Hill is a 69 y.o. female with medical history significant for CVA, unsteady gait of unknown cause, osteoporosis and hypertension who presents with concerns of worsening shortness of breath.   Pt presented to her PCP office today for concerns of shortness of breath and was sent to ED for evaluation after she was found to be hypoxic down to 70-80%. Symptoms started about a week ago when she began to notice shortness of breath worse with exertion and improve with rest. Also notes increasing LE edema. Denies orthopnea or PND. No fever. Has mild cough. No sick contact. She was a former smoker but quit 12 years ago but 10 year smoking history.   No family hx of cardiac history. Father had unknown type of bone cancer.   ED Course:  She initially required 6L here after desaturation down to 69% on room air. Now able to wean down to 3L after several duo-neb treatments and 125mg  solu medrol.   No leukocytosis, hemoglobin of 15.5.  Sodium of 133, glucose of 118.  BNP of 165.  Troponin flat at 8.  Chest x-ray shows chronic appearing bronchitic markings with superimposed peripheral nodularity in the right lung.  CTA chest shows no evidence for acute pulmonary embolism.  Heart size appears mildly enlarged.  There is emphysema and aortic arthrosclerosis.  Multiple vessel coronary artery calcification. Review of Systems:  Constitutional: No Weight Change, No Fever ENT/Mouth: No sore throat, No Rhinorrhea Eyes: No Eye Pain, No Vision Changes Cardiovascular: No Chest Pain, + SOB, No PND, + Dyspnea on Exertion, No Orthopnea, No Claudication, + Edema, No Palpitations Respiratory: + Cough, No Sputum    Gastrointestinal: No Nausea, No Vomiting, No Diarrhea, No Constipation, No Pain Genitourinary: no Urinary Incontinence, No Urgency, No Flank Pain Musculoskeletal: No Arthralgias, No Myalgias Skin: No Skin Lesions, No Pruritus, Neuro: no Weakness, No Numbness,  Psych: No Anxiety/Panic, No Depression, no decrease appetite Heme/Lymph: No Bruising, No Bleeding  Past Medical History:  Diagnosis Date  . Abnormal gait 12/18/2015  . BP (high blood pressure) 08/24/2015  . Cancer ([place])    skin  . Constriction of ureter (postoperative) 07/28/2015  . Hydronephrosis 07/14/2015  . Hypertension   . Left foot drop 12/18/2015  . Recurrent UTI   . UPJ (ureteropelvic junction) obstruction 07/28/2015  . UTI (lower urinary tract infection) 07/14/2015    Past Surgical History:  Procedure Laterality Date  . broken foot  2012  . COLONOSCOPY WITH PROPOFOL N/A 06/30/2017   Procedure: COLONOSCOPY WITH PROPOFOL;  Surgeon: Lollie Sails, MD;  Location: [facility] ENDOSCOPY;  Service: Endoscopy;  Laterality: N/A;  . kidney repair  1985   repaired torn blood vessel in kidney     reports that she has quit smoking. She has never used smokeless tobacco. She reports that she does not drink alcohol or use drugs.  Allergies  Allergen Reactions  . Oxycodone     Family History  Problem Relation Age of Onset  . Kidney disease Neg Hx   . Bladder Cancer Neg Hx      Prior to Admission medications   Medication Sig Start Date End Date Taking? Authorizing Provider  alendronate (FOSAMAX) 70 MG tablet Take 70 mg by  mouth every Monday.    Yes [provider]  aspirin 81 MG chewable tablet Chew 81 mg by mouth daily.   Yes [provider]  Calcium Carb-Cholecalciferol (OYSTER SHELL CALCIUM) 500-400 MG-UNIT TABS Take 1 tablet by mouth daily.    Yes [provider]  hydrochlorothiazide (HYDRODIURIL) 12.5 MG tablet Take 12.5 mg by mouth daily.  03/23/19  Yes [provider]  Multiple  Vitamins-Minerals (CENTRUM SILVER PO) Take 1 tablet by mouth daily.    Yes [provider]  MYRBETRIQ 25 MG TB24 tablet TAKE 1 TABLET BY MOUTH EVERY DAY Patient taking differently: Take 25 mg by mouth daily.  02/18/19  Yes Hollice Espy, MD    Physical Exam: Vitals:   05/16/19 1700 05/16/19 1730 05/16/19 1815 05/16/19 1930  BP: (!) 155/81 (!) 150/82  (!) 169/92  Pulse: 84 84 85 87  Resp: (!) 25 18 16 16   Temp:      TempSrc:      SpO2: 99% 100% 100% 98%  Weight:      Height:        Constitutional: NAD, calm, comfortable, elderly female laying flat in bed Vitals:   05/16/19 1700 05/16/19 1730 05/16/19 1815 05/16/19 1930  BP: (!) 155/81 (!) 150/82  (!) 169/92  Pulse: 84 84 85 87  Resp: (!) 25 18 16 16   Temp:      TempSrc:      SpO2: 99% 100% 100% 98%  Weight:      Height:       Eyes: PERRL, lids and conjunctivae normal ENMT: Mucous membranes are moist.  Neck: normal, supple Respiratory: Diminished breath sounds throughout with no wheezing, no crackles. Normal respiratory effort on 2L.  Cardiovascular: Regular rate and rhythm, no murmurs / rubs / gallops.  +3 edema of bilateral lower extremity pretibial region.   Abdomen: no tenderness, no masses palpated. Bowel sounds positive.  Musculoskeletal: no clubbing / cyanosis. No joint deformity upper and lower extremities. Good ROM, no contractures. Normal muscle tone.  Skin: no rashes, lesions, ulcers. No induration Neurologic: CN 2-12 grossly intact. Sensation intact. Strength 5/5 in all 4.  Psychiatric: Normal judgment and insight. Alert and oriented x 3. Normal mood.    Labs on Admission: I have personally reviewed following labs and imaging studies  CBC: Recent Labs  Lab 05/16/19 1514  WBC 7.8  NEUTROABS 6.3  HGB 15.5*  HCT 48.1*  MCV 99.6  PLT 0000000   Basic Metabolic Panel: Recent Labs  Lab 05/16/19 1514  NA 133*  K 3.7  CL 86*  CO2 38*  GLUCOSE 118*  BUN 20  CREATININE 0.72  CALCIUM 10.0    GFR: Estimated Creatinine Clearance: 64.7 mL/min (by C-G formula based on SCr of 0.72 mg/dL). Liver Function Tests: Recent Labs  Lab 05/16/19 1514  AST 20  ALT 31  ALKPHOS 87  BILITOT 0.9  PROT 8.0  ALBUMIN 4.6   No results for input(s): LIPASE, AMYLASE in the last 168 hours. No results for input(s): AMMONIA in the last 168 hours. Coagulation Profile: Recent Labs  Lab 05/16/19 1514  INR 1.0   Cardiac Enzymes: No results for input(s): CKTOTAL, CKMB, CKMBINDEX, TROPONINI in the last 168 hours. BNP (last 3 results) No results for input(s): PROBNP in the last 8760 hours. HbA1C: No results for input(s): HGBA1C in the last 72 hours. CBG: No results for input(s): GLUCAP in the last 168 hours. Lipid Profile: No results for input(s): CHOL, HDL, LDLCALC, TRIG, CHOLHDL, LDLDIRECT in  the last 72 hours. Thyroid Function Tests: No results for input(s): TSH, T4TOTAL, FREET4, T3FREE, THYROIDAB in the last 72 hours. Anemia Panel: No results for input(s): VITAMINB12, FOLATE, FERRITIN, TIBC, IRON, RETICCTPCT in the last 72 hours. Urine analysis:    Component Value Date/Time   COLORURINE YELLOW (A) 06/13/2015 0221   APPEARANCEUR Cloudy (A) 09/24/2018 1049   LABSPEC 1.021 06/13/2015 0221   PHURINE 5.0 06/13/2015 0221   GLUCOSEU Negative 09/24/2018 1049   HGBUR 1+ (A) 06/13/2015 0221   BILIRUBINUR Negative 09/24/2018 1049   KETONESUR TRACE (A) 06/13/2015 0221   PROTEINUR 1+ (A) 09/24/2018 1049   PROTEINUR 30 (A) 06/13/2015 0221   NITRITE Negative 09/24/2018 1049   NITRITE POSITIVE (A) 06/13/2015 0221   LEUKOCYTESUR 3+ (A) 09/24/2018 1049    Radiological Exams on Admission: CT Angio Chest PE W and/or Wo Contrast  Result Date: 05/16/2019 CLINICAL DATA:  Short of breath. EXAM: CT ANGIOGRAPHY CHEST WITH CONTRAST TECHNIQUE: Multidetector CT imaging of the chest was performed using the standard protocol during bolus administration of intravenous contrast. Multiplanar CT image  reconstructions and MIPs were obtained to evaluate the vascular anatomy. CONTRAST:  56mL OMNIPAQUE IOHEXOL 350 MG/ML SOLN COMPARISON:  None. FINDINGS: Cardiovascular: The heart size appears mildly enlarged. Aortic atherosclerosis. Lad and RCA coronary artery calcifications. The main pulmonary artery appears patent. No central obstructing pulmonary emboli. No lobar or segmental pulmonary artery filling defects. Mediastinum/Nodes: No enlarged mediastinal, hilar, or axillary lymph nodes. Thyroid gland, trachea, and esophagus demonstrate no significant findings. Lungs/Pleura: Centrilobular and paraseptal emphysema. No airspace consolidation, atelectasis or pneumothorax. Upper Abdomen: No acute abnormality. Calcified granuloma identified within the spleen. Musculoskeletal: No chest wall abnormality. No acute or significant osseous findings. Review of the MIP images confirms the above findings. IMPRESSION: 1. No evidence for acute pulmonary embolus. 2. Emphysema and aortic atherosclerosis. 3. Multi vessel coronary artery calcifications. Aortic Atherosclerosis (ICD10-I70.0) and Emphysema (ICD10-J43.9). Electronically Signed   By: Kerby Moors M.D.   On: 05/16/2019 17:33   US Venous Img Lower Bilateral  Result Date: 05/16/2019 CLINICAL DATA:  Shortness of breath.  Rule out DVT. Edema. EXAM: BILATERAL LOWER EXTREMITY VENOUS DOPPLER ULTRASOUND TECHNIQUE: Gray-scale sonography with compression, as well as color and duplex ultrasound, were performed to evaluate the deep venous system(s) from the level of the common femoral vein through the popliteal and proximal calf veins. COMPARISON:  None. FINDINGS: VENOUS Normal compressibility of the common femoral, superficial femoral, and popliteal veins, as well as the visualized calf veins. Visualized portions of profunda femoral vein and great saphenous vein unremarkable. No filling defects to suggest DVT on grayscale or color Doppler imaging. Doppler waveforms show normal  direction of venous flow, normal respiratory phasicity and response to augmentation. OTHER Avascular fluid collection in the left popliteal fossa measuring 2.4 x 0.8 x 3.0 cm is most consistent with Baker cyst. Subcutaneous edema in the calfs. Limitations: none IMPRESSION: 1. No evidence of bilateral lower extremity DVT. 2. Left Baker's cyst measuring 2.4 x 0.8 x 3 cm. Electronically Signed   By: Keith Rake M.D.   On: 05/16/2019 19:39   DG Chest Portable 1 View  Result Date: 05/16/2019 CLINICAL DATA:  Fatigue, malaise EXAM: PORTABLE CHEST 1 VIEW COMPARISON:  05/01/2009 FINDINGS: Normal mediastinum and cardiac silhouette. Chronic central bronchitic markings. Some peripheral nodularity in the RIGHT lung. No pneumothorax. No focal consolidation IMPRESSION: Chronic appearing bronchitic markings. Superimposed peripheral nodularity in the RIGHT lung. Recommend correlation for pulmonary infection and consider CT of the thorax or  at minimal follow-up pulmonary radiograph. Electronically Signed   By: Suzy Bouchard M.D.   On: 05/16/2019 15:53    EKG: Independently reviewed.  Assessment/Plan  Acute hypoxic respiratory failure secondary to undiagnosed COPD vs new onset CHF Continue duonebs scheduled q6hr Continue daily IV 40mg  solu-medrol  Trial IV 40mg  Lasix Strict Is and Os  Daily weights  Obtain Echocardiogram  Hypertension Continue HCTZ  Hx of CVA Continue aspirin  Abnormal gait of unknown origin Followed [facility] neurology and has continuous workup for potential myelopathy   DVT prophylaxis:.Lovenox Code Status: DNR Family Communication: Plan discussed with patient at bedside  disposition Plan: Home with at least 2 midnight stays  Consults called:  Admission status: inpatient   Raseel Jans T Pauletta Pickney DO [facility]   If 7PM-7AM, please contact night-coverage [email or web address]   05/16/2019, 8:28 PM

## 2019-05-16 NOTE — ED Triage Notes (Signed)
Pt arrived from [facility].  Went to doctor because has had fatigue and malaise for 2 weeks. sats low at office and in 60s here on arrival.  Placed on 5 L [place] and increased to 80s, sats WNL on 6 L [place]. Pt denies feeling SHOB. No pain.

## 2019-05-17 ENCOUNTER — Inpatient Hospital Stay: Payer: PPO

## 2019-05-17 ENCOUNTER — Inpatient Hospital Stay
Admit: 2019-05-17 | Discharge: 2019-05-17 | Disposition: A | Discharge status: Home / Self Care | Payer: PPO | Attending: Family Medicine | Admitting: Family Medicine

## 2019-05-17 ENCOUNTER — Ambulatory Visit: Payer: PPO | Admitting: Physical Therapy

## 2019-05-17 DIAGNOSIS — R2681 Unsteadiness on feet: Secondary | ICD-10-CM

## 2019-05-17 DIAGNOSIS — Z8673 Personal history of transient ischemic attack (TIA), and cerebral infarction without residual deficits: Secondary | ICD-10-CM

## 2019-05-17 DIAGNOSIS — I10 Essential (primary) hypertension: Secondary | ICD-10-CM

## 2019-05-17 LAB — BLOOD GAS, ARTERIAL
Acid-Base Excess: 21.7 mmol/L — ABNORMAL HIGH (ref 0.0–2.0)
Bicarbonate: 52.1 mmol/L — ABNORMAL HIGH (ref 20.0–28.0)
FIO2: 0.3
O2 Saturation: 92.6 %
Patient temperature: 37
pCO2 arterial: 88 mmHg (ref 32.0–48.0)
pH, Arterial: 7.38 (ref 7.350–7.450)
pO2, Arterial: 67 mmHg — ABNORMAL LOW (ref 83.0–108.0)

## 2019-05-17 LAB — CBC
HCT: 46 % (ref 36.0–46.0)
Hemoglobin: 15.2 g/dL — ABNORMAL HIGH (ref 12.0–15.0)
MCH: 32.1 pg (ref 26.0–34.0)
MCHC: 33 g/dL (ref 30.0–36.0)
MCV: 97.3 fL (ref 80.0–100.0)
Platelets: 192 10*3/uL (ref 150–400)
RBC: 4.73 MIL/uL (ref 3.87–5.11)
RDW: 14.3 % (ref 11.5–15.5)
WBC: 6.1 10*3/uL (ref 4.0–10.5)
nRBC: 0 % (ref 0.0–0.2)

## 2019-05-17 LAB — BASIC METABOLIC PANEL
Anion gap: 10 (ref 5–15)
Anion gap: 12 (ref 5–15)
BUN: 17 mg/dL (ref 8–23)
BUN: 17 mg/dL (ref 8–23)
CO2: 40 mmol/L — ABNORMAL HIGH (ref 22–32)
CO2: 39 mmol/L — ABNORMAL HIGH (ref 22–32)
Calcium: 9.2 mg/dL (ref 8.9–10.3)
Calcium: 9.5 mg/dL (ref 8.9–10.3)
Chloride: 85 mmol/L — ABNORMAL LOW (ref 98–111)
Chloride: 87 mmol/L — ABNORMAL LOW (ref 98–111)
Creatinine, Ser: 0.63 mg/dL (ref 0.44–1.00)
Creatinine, Ser: 0.73 mg/dL (ref 0.44–1.00)
GFR calc Af Amer: >60 mL/min (ref >60)
GFR calc Af Amer: >60 mL/min (ref >60)
GFR calc non Af Amer: >60 mL/min (ref >60)
GFR calc non Af Amer: >60 mL/min (ref >60)
Glucose, Bld: 169 mg/dL — ABNORMAL HIGH (ref 70–99)
Glucose, Bld: 149 mg/dL — ABNORMAL HIGH (ref 70–99)
Potassium: 3.8 mmol/L (ref 3.5–5.1)
Potassium: 3.7 mmol/L (ref 3.5–5.1)
Sodium: 135 mmol/L (ref 135–145)
Sodium: 138 mmol/L (ref 135–145)

## 2019-05-17 LAB — GLUCOSE, CAPILLARY
Glucose-Capillary: 109 mg/dL — ABNORMAL HIGH (ref 70–99)
Glucose-Capillary: 138 mg/dL — ABNORMAL HIGH (ref 70–99)
Glucose-Capillary: 142 mg/dL — ABNORMAL HIGH (ref 70–99)
Glucose-Capillary: 84 mg/dL (ref 70–99)

## 2019-05-17 LAB — ECHOCARDIOGRAM COMPLETE
Height: 64 in
Weight: 2572.8 oz

## 2019-05-17 LAB — MAGNESIUM: Magnesium: 1.9 mg/dL (ref 1.7–2.4)

## 2019-05-17 LAB — TROPONIN I (HIGH SENSITIVITY): Troponin I (High Sensitivity): 8 ng/L (ref <18)

## 2019-05-17 MED ORDER — HYDROCHLOROTHIAZIDE 25 MG PO TABS
25.0000 mg | ORAL_TABLET | Freq: Every day | ORAL | Status: DC
  Administered 2019-05-18: 25 mg via ORAL
  Filled 2019-05-17: qty 1

## 2019-05-17 MED ORDER — ALBUTEROL SULFATE (2.5 MG/3ML) 0.083% IN NEBU: 2.5000 mg | INHALATION_SOLUTION | RESPIRATORY_TRACT | Status: DC | PRN

## 2019-05-17 MED ORDER — ASPIRIN 81 MG PO CHEW
81.0000 mg | CHEWABLE_TABLET | Freq: Every day | ORAL | Status: DC
  Administered 2019-05-17 – 2019-05-19 (×3): 81 mg via ORAL
  Filled 2019-05-17 (×3): qty 1

## 2019-05-17 MED ORDER — ARFORMOTEROL TARTRATE 15 MCG/2ML IN NEBU
15.0000 ug | INHALATION_SOLUTION | Freq: Two times a day (BID) | RESPIRATORY_TRACT | Status: DC
  Administered 2019-05-17 – 2019-05-19 (×4): 15 ug via RESPIRATORY_TRACT
  Filled 2019-05-17 (×5): qty 2

## 2019-05-17 MED ORDER — METHYLPREDNISOLONE SODIUM SUCC 40 MG IJ SOLR: 40.0000 mg | Freq: Two times a day (BID) | INTRAMUSCULAR | Status: DC

## 2019-05-17 MED ORDER — MIRABEGRON ER 25 MG PO TB24
25.0000 mg | ORAL_TABLET | Freq: Every day | ORAL | Status: DC
  Administered 2019-05-17 – 2019-05-19 (×3): 25 mg via ORAL
  Filled 2019-05-17 (×3): qty 1

## 2019-05-17 MED ORDER — HYDROCHLOROTHIAZIDE 25 MG PO TABS
12.5000 mg | ORAL_TABLET | Freq: Every day | ORAL | Status: DC
  Administered 2019-05-17: 12.5 mg via ORAL
  Filled 2019-05-17: qty 1

## 2019-05-17 MED ORDER — GUAIFENESIN ER 600 MG PO TB12
600.0000 mg | ORAL_TABLET | Freq: Two times a day (BID) | ORAL | Status: DC
  Administered 2019-05-17 – 2019-05-19 (×4): 600 mg via ORAL
  Filled 2019-05-17 (×4): qty 1

## 2019-05-17 MED ORDER — METHYLPREDNISOLONE SODIUM SUCC 40 MG IJ SOLR
40.0000 mg | Freq: Three times a day (TID) | INTRAMUSCULAR | Status: DC
  Administered 2019-05-17 – 2019-05-19 (×6): 40 mg via INTRAVENOUS
  Filled 2019-05-17 (×6): qty 1

## 2019-05-17 MED ORDER — BUDESONIDE 0.25 MG/2ML IN SUSP
0.2500 mg | Freq: Two times a day (BID) | RESPIRATORY_TRACT | Status: DC
  Administered 2019-05-17 – 2019-05-19 (×4): 0.25 mg via RESPIRATORY_TRACT
  Filled 2019-05-17 (×4): qty 2

## 2019-05-17 NOTE — Progress Notes (Signed)
PROGRESS NOTE    CAROLEE EREKSON  [id number] DOB: Jul 09, 1950 DOA: 05/16/2019 PCP: Baxter Hire, MD   Brief Narrative:   HPI: RHODORA KAUTZ is a 69 y.o. female with medical history significant for CVA, unsteady gait of unknown cause, osteoporosis and hypertension who presents with concerns of worsening shortness of breath.   Pt presented to her PCP office today for concerns of shortness of breath and was sent to ED for evaluation after she was found to be hypoxic down to 70-80%. Symptoms started about a week ago when she began to notice shortness of breath worse with exertion and improve with rest. Also notes increasing LE edema. Denies orthopnea or PND. No fever. Has mild cough. No sick contact. She was a former smoker but quit 12 years ago but 10 year smoking history.   No family hx of cardiac history. Father had unknown type of bone cancer.   3/16: Patient seen and examined.  Remains on 3 to 4 L nasal cannula.  Not speaking in complete sentences.  Normal work of breathing.  Echocardiogram completed and reviewed.  Normal ejection fraction with grade 1 diastolic dysfunction.  Unlikely that congestive heart failure is contributing to current presentation.    Assessment & Plan:   Principal Problem:   Acute respiratory failure with hypoxia (HCC) Active Problems:   Essential hypertension   History of CVA (cerebrovascular accident)   Unsteady gait  Suspected undiagnosed COPD Acute hypoxic respiratory failure Patient does have a smoking history but quit 12 years ago  Echocardiogram demonstrates grade 1 diastolic dysfunction, possibly pseudonormal  Suspect that underlying COPD/emphysema is main driver for hypoxic respiratory failure Plan: DuoNeb every 6 hours scheduled IV Solu-Medrol 40 mg every 8 hours, titrate dose quickly Brovana Pulmicort Mucolytic's Wean oxygen as tolerated No further loop diuretics at this time.  We will continue home  hydrochlorothiazide.  Hypertension Continue hydrochlorothiazide, dose increased to 25 mg daily  Hx of CVA Continue aspirin  Abnormal gait of unknown origin Followed by [facility] neurology and has continuous workup for potential myelopathy MRI demonstrates stable communicating hydrocephalus as noted on imaging from 2018 Outpatient follow-up   DVT prophylaxis: Lovenox Code Status: DNR Family Communication:husband Maykayla Rawn [phone] on 05/17/2019 Disposition Plan: Anticipate return back to previous home environment. Patient currently on 3 to 4 L nasal cannula. Will attempt to treat underlying COPD exacerbation with goal of titrating from nasal cannula entirely. If unable to titrate patient may require home oxygen or potentially pulmonary consult during this hospitalization for more recommendations  Consultants:   None  Procedures:  2D echocardiogram  Antimicrobials:   None   Subjective: Patient seen and examined No respiratory distress, able to speak in complete sentences No pain complaints  Objective: Vitals:   05/17/19 0809 05/17/19 0812 05/17/19 1154 05/17/19 1403  BP:  (!) 146/75 122/72   Pulse:  93 85   Resp:  17 17   Temp:  97.6 F (36.4 C) 97.7 F (36.5 C)   TempSrc:   Oral   SpO2: 96% 96% 94% 92%  Weight:      Height:        Intake/Output Summary (Last 24 hours) at 05/17/2019 1450 Last data filed at 05/17/2019 1330 Gross per 24 hour  Intake 1200 ml  Output 1600 ml  Net -400 ml   Filed Weights   05/16/19 1454 05/16/19 2106 05/17/19 0507  Weight: 76.7 kg 76.7 kg 72.9 kg    Examination:  General exam: Appears calm and comfortable  Respiratory system: Markedly diminished air movement. Normal respiratory effort Cardiovascular system: S1 & S2 heard, RRR. No JVD, murmurs, rubs, gallops or clicks. 2+ pitting edema bilaterally Gastrointestinal system: Abdomen is nondistended, soft and nontender. No organomegaly or masses felt. Normal bowel sounds  heard. Central nervous system: Alert and oriented. No focal neurological deficits. Extremities: Symmetric 5 x 5 power. Skin: No rashes, lesions or ulcers Psychiatry: Judgement and insight appear normal. Mood & affect appropriate.     Data Reviewed: I have personally reviewed following labs and imaging studies  CBC: Recent Labs  Lab 05/16/19 1514 05/17/19 0344  WBC 7.8 6.1  NEUTROABS 6.3  --   HGB 15.5* 15.2*  HCT 48.1* 46.0  MCV 99.6 97.3  PLT 188 [id number]   Basic Metabolic Panel: Recent Labs  Lab 05/16/19 1514 05/17/19 0054 05/17/19 0344  NA 133* 135 138  K 3.7 3.8 3.7  CL 86* 85* 87*  CO2 38* 40* 39*  GLUCOSE 118* 169* 149*  BUN 20 17 17   CREATININE 0.72 0.63 0.73  CALCIUM 10.0 9.2 9.5  MG  --  1.9  --    GFR: Estimated Creatinine Clearance: 63.1 mL/min (by C-G formula based on SCr of 0.73 mg/dL). Liver Function Tests: Recent Labs  Lab 05/16/19 1514  AST 20  ALT 31  ALKPHOS 87  BILITOT 0.9  PROT 8.0  ALBUMIN 4.6   No results for input(s): LIPASE, AMYLASE in the last 168 hours. No results for input(s): AMMONIA in the last 168 hours. Coagulation Profile: Recent Labs  Lab 05/16/19 1514  INR 1.0   Cardiac Enzymes: No results for input(s): CKTOTAL, CKMB, CKMBINDEX, TROPONINI in the last 168 hours. BNP (last 3 results) No results for input(s): PROBNP in the last 8760 hours. HbA1C: No results for input(s): HGBA1C in the last 72 hours. CBG: Recent Labs  Lab 05/17/19 0020 05/17/19 1152  GLUCAP 142* 84   Lipid Profile: No results for input(s): CHOL, HDL, LDLCALC, TRIG, CHOLHDL, LDLDIRECT in the last 72 hours. Thyroid Function Tests: No results for input(s): TSH, T4TOTAL, FREET4, T3FREE, THYROIDAB in the last 72 hours. Anemia Panel: No results for input(s): VITAMINB12, FOLATE, FERRITIN, TIBC, IRON, RETICCTPCT in the last 72 hours. Sepsis Labs: No results for input(s): PROCALCITON, LATICACIDVEN in the last 168 hours.  Recent Results (from the past 240  hour(s))  Respiratory Panel by RT PCR (Flu A&B, Covid) - Nasopharyngeal Swab     Status: None   Collection Time: 05/16/19  4:00 PM   Specimen: Nasopharyngeal Swab  Result Value Ref Range Status   SARS Coronavirus 2 by RT PCR NEGATIVE NEGATIVE Final    Comment: (NOTE) SARS-CoV-2 target nucleic acids are NOT DETECTED. The SARS-CoV-2 RNA is generally detectable in upper respiratoy specimens during the acute phase of infection. The lowest concentration of SARS-CoV-2 viral copies this assay can detect is 131 copies/mL. A negative result does not preclude SARS-Cov-2 infection and should not be used as the sole basis for treatment or other patient management decisions. A negative result may occur with  improper specimen collection/handling, submission of specimen other than nasopharyngeal swab, presence of viral mutation(s) within the areas targeted by this assay, and inadequate number of viral copies (<131 copies/mL). A negative result must be combined with clinical observations, patient history, and epidemiological information. The expected result is Negative. Fact Sheet for Patients:  PinkCheek.be Fact Sheet for Healthcare Providers:  GravelBags.it This test is not yet ap proved or cleared by the [place] FDA and  has been  authorized for detection and/or diagnosis of SARS-CoV-2 by FDA under an Emergency Use Authorization (EUA). This EUA will remain  in effect (meaning this test can be used) for the duration of the COVID-19 declaration under Section 564(b)(1) of the Act, 21 U.S.C. section 360bbb-3(b)(1), unless the authorization is terminated or revoked sooner.    Influenza A by PCR NEGATIVE NEGATIVE Final   Influenza B by PCR NEGATIVE NEGATIVE Final    Comment: (NOTE) The Xpert Xpress SARS-CoV-2/FLU/RSV assay is intended as an aid in  the diagnosis of influenza from Nasopharyngeal swab specimens and  should not be used as  a sole basis for treatment. Nasal washings and  aspirates are unacceptable for Xpert Xpress SARS-CoV-2/FLU/RSV  testing. Fact Sheet for Patients: PinkCheek.be Fact Sheet for Healthcare Providers: GravelBags.it This test is not yet approved or cleared by the [place] FDA and  has been authorized for detection and/or diagnosis of SARS-CoV-2 by  FDA under an Emergency Use Authorization (EUA). This EUA will remain  in effect (meaning this test can be used) for the duration of the  Covid-19 declaration under Section 564(b)(1) of the Act, 21  U.S.C. section 360bbb-3(b)(1), unless the authorization is  terminated or revoked. Performed at [facility], [address] [place], [place] [address]          Radiology Studies: CT HEAD WO CONTRAST  Result Date: 05/17/2019 CLINICAL DATA:  69 year old female with altered mental status. EXAM: CT HEAD WITHOUT CONTRAST TECHNIQUE: Contiguous axial images were obtained from the base of the skull through the vertex without intravenous contrast. COMPARISON:  Head CT dated 04/29/2009. FINDINGS: Brain: There is hydrocephalus with dilatation of the lateral and third ventricles, slightly progressed since the prior CT. The fourth ventricle is unremarkable. There is associated mass effect and effacement of the sulci. There is periventricular and deep white matter chronic microvascular ischemic changes, progressed since the prior CT which may represent a combination of chronic microvascular ischemic changes and transependymal edema. MRI may provide better evaluation. There is no acute intracranial hemorrhage. No midline shift. Vascular: No hyperdense vessel or unexpected calcification. Skull: Normal. Negative for fracture or focal lesion. Sinuses/Orbits: No acute finding. Other: None IMPRESSION: 1. No acute intracranial pathology. 2. Interval progression of hydrocephalus with dilatation of the  lateral and third ventricles. 3. Progression of periventricular and white matter hypodensities. Findings may represent a combination of chronic microvascular ischemic changes and transependymal edema. MRI may provide better evaluation. Electronically Signed   By: Anner Crete M.D.   On: 05/17/2019 02:22   CT Angio Chest PE W and/or Wo Contrast  Result Date: 05/16/2019 CLINICAL DATA:  Short of breath. EXAM: CT ANGIOGRAPHY CHEST WITH CONTRAST TECHNIQUE: Multidetector CT imaging of the chest was performed using the standard protocol during bolus administration of intravenous contrast. Multiplanar CT image reconstructions and MIPs were obtained to evaluate the vascular anatomy. CONTRAST:  72mL OMNIPAQUE IOHEXOL 350 MG/ML SOLN COMPARISON:  None. FINDINGS: Cardiovascular: The heart size appears mildly enlarged. Aortic atherosclerosis. Lad and RCA coronary artery calcifications. The main pulmonary artery appears patent. No central obstructing pulmonary emboli. No lobar or segmental pulmonary artery filling defects. Mediastinum/Nodes: No enlarged mediastinal, hilar, or axillary lymph nodes. Thyroid gland, trachea, and esophagus demonstrate no significant findings. Lungs/Pleura: Centrilobular and paraseptal emphysema. No airspace consolidation, atelectasis or pneumothorax. Upper Abdomen: No acute abnormality. Calcified granuloma identified within the spleen. Musculoskeletal: No chest wall abnormality. No acute or significant osseous findings. Review of the MIP images confirms the above findings. IMPRESSION: 1.  No evidence for acute pulmonary embolus. 2. Emphysema and aortic atherosclerosis. 3. Multi vessel coronary artery calcifications. Aortic Atherosclerosis (ICD10-I70.0) and Emphysema (ICD10-J43.9). Electronically Signed   By: Kerby Moors M.D.   On: 05/16/2019 17:33   MR BRAIN WO CONTRAST  Result Date: 05/17/2019 CLINICAL DATA:  Increased hydrocephalus EXAM: MRI HEAD WITHOUT CONTRAST TECHNIQUE: Multiplanar,  multiecho pulse sequences of the brain and surrounding structures were obtained without intravenous contrast. COMPARISON:  MRI 2018, CT 05/17/2019 FINDINGS: Brain: Enlargement of the ventricular system, primarily third and lateral ventricles, is again identified. Caliber is not substantially changed since 2019 MRI. Patchy and confluent T2 hyperintensity in the supratentorial greater than pontine white matter is nonspecific but may reflect stable chronic microvascular ischemic changes potentially with some component of hydrocephalus related interstitial edema. There is no acute infarction or intracranial hemorrhage. There is no intracranial mass, mass effect, or edema. There is no hydrocephalus or extra-axial fluid collection. Vascular: Major vessel flow voids at the skull base are preserved. Skull and upper cervical spine: Normal marrow signal is preserved. Sinuses/Orbits: Paranasal sinuses are aerated. Orbits are unremarkable. Other: Sella is unremarkable.  Mastoid air cells are clear. IMPRESSION: Chronic communicating hydrocephalus is not substantially changed since 2018. No acute abnormality. Electronically Signed   By: Macy Mis M.D.   On: 05/17/2019 08:08   US Venous Img Lower Bilateral  Result Date: 05/16/2019 CLINICAL DATA:  Shortness of breath.  Rule out DVT. Edema. EXAM: BILATERAL LOWER EXTREMITY VENOUS DOPPLER ULTRASOUND TECHNIQUE: Gray-scale sonography with compression, as well as color and duplex ultrasound, were performed to evaluate the deep venous system(s) from the level of the common femoral vein through the popliteal and proximal calf veins. COMPARISON:  None. FINDINGS: VENOUS Normal compressibility of the common femoral, superficial femoral, and popliteal veins, as well as the visualized calf veins. Visualized portions of profunda femoral vein and great saphenous vein unremarkable. No filling defects to suggest DVT on grayscale or color Doppler imaging. Doppler waveforms show normal  direction of venous flow, normal respiratory phasicity and response to augmentation. OTHER Avascular fluid collection in the left popliteal fossa measuring 2.4 x 0.8 x 3.0 cm is most consistent with Baker cyst. Subcutaneous edema in the calfs. Limitations: none IMPRESSION: 1. No evidence of bilateral lower extremity DVT. 2. Left Baker's cyst measuring 2.4 x 0.8 x 3 cm. Electronically Signed   By: Keith Rake M.D.   On: 05/16/2019 19:39   DG Chest Portable 1 View  Result Date: 05/16/2019 CLINICAL DATA:  Fatigue, malaise EXAM: PORTABLE CHEST 1 VIEW COMPARISON:  05/01/2009 FINDINGS: Normal mediastinum and cardiac silhouette. Chronic central bronchitic markings. Some peripheral nodularity in the RIGHT lung. No pneumothorax. No focal consolidation IMPRESSION: Chronic appearing bronchitic markings. Superimposed peripheral nodularity in the RIGHT lung. Recommend correlation for pulmonary infection and consider CT of the thorax or at minimal follow-up pulmonary radiograph. Electronically Signed   By: Suzy Bouchard M.D.   On: 05/16/2019 15:53   ECHOCARDIOGRAM COMPLETE  Result Date: 05/17/2019    ECHOCARDIOGRAM REPORT   Patient Name:   NAZ LARROW Date of Exam: 05/17/2019 Medical Rec #:  [id number]   Height:       64.0 in Accession #:    [id number]  Weight:       160.8 lb Date of Birth:  1950-04-11   BSA:          1.783 m Patient Age:    29 years    BP:  146/75 mmHg Patient Gender: F           HR:           87 bpm. Exam Location:  [facility] Procedure: 2D Echo, Color Doppler and Cardiac Doppler Indications:     I51.7 Cardiomegaly  History:         Patient has no prior history of Echocardiogram examinations.                  Signs/Symptoms:Leg edema, fatigue; Risk Factors:Hypertension.  Sonographer:     Charmayne Sheer RDCS (AE) Referring Phys:  [id number] Angela Burke TU Diagnosing Phys: Bartholome Bill MD IMPRESSIONS  1. Left ventricular ejection fraction, by estimation, is 65 to 70%. The left ventricle has normal function.  The left ventricle has no regional wall motion abnormalities. Left ventricular diastolic parameters are consistent with Grade I diastolic dysfunction (impaired relaxation).  2. Right ventricular systolic function is normal. The right ventricular size is mildly enlarged.  3. The mitral valve was not well visualized. Trivial mitral valve regurgitation.  4. The aortic valve is tricuspid. Aortic valve regurgitation is not visualized. No aortic stenosis is present. FINDINGS  Left Ventricle: Left ventricular ejection fraction, by estimation, is 65 to 70%. The left ventricle has normal function. The left ventricle has no regional wall motion abnormalities. The left ventricular internal cavity size was normal in size. There is  borderline left ventricular hypertrophy. Left ventricular diastolic parameters are consistent with Grade I diastolic dysfunction (impaired relaxation). Right Ventricle: The right ventricular size is mildly enlarged. No increase in right ventricular wall thickness. Right ventricular systolic function is normal. Left Atrium: Left atrial size was normal in size. Right Atrium: Right atrial size was normal in size. Pericardium: There is no evidence of pericardial effusion. Mitral Valve: The mitral valve was not well visualized. Trivial mitral valve regurgitation. MV peak gradient, 3.5 mmHg. The mean mitral valve gradient is 1.0 mmHg. Tricuspid Valve: The tricuspid valve is grossly normal. Tricuspid valve regurgitation is trivial. Aortic Valve: The aortic valve is tricuspid. Aortic valve regurgitation is not visualized. No aortic stenosis is present. Aortic valve mean gradient measures 4.0 mmHg. Aortic valve peak gradient measures 8.9 mmHg. Aortic valve area, by VTI measures 1.08 cm. Pulmonic Valve: The pulmonic valve was not well visualized. Pulmonic valve regurgitation is trivial. Aorta: The aortic root is normal in size and structure. IAS/Shunts: The atrial septum is grossly normal.  LEFT VENTRICLE  PLAX 2D LVIDd:         4.63 cm  Diastology LVIDs:         2.69 cm  LV e' lateral:   8.38 cm/s LV PW:         0.95 cm  LV E/e' lateral: 9.3 LV IVS:        0.57 cm  LV e' medial:    8.27 cm/s LVOT diam:     1.80 cm  LV E/e' medial:  9.4 LV SV:         31 LV SV Index:   17 LVOT Area:     2.54 cm  LEFT ATRIUM             Index LA diam:        3.20 cm 1.79 cm/m LA Vol (A2C):   40.2 ml 22.54 ml/m LA Vol (A4C):   33.9 ml 19.01 ml/m LA Biplane Vol: 37.4 ml 20.97 ml/m  AORTIC VALVE  PULMONIC VALVE AV Area (Vmax):    1.34 cm    PV Vmax:       1.14 m/s AV Area (Vmean):   1.40 cm    PV Vmean:      91.000 cm/s AV Area (VTI):     1.08 cm    PV VTI:        0.259 m AV Vmax:           149.00 cm/s PV Peak grad:  5.2 mmHg AV Vmean:          96.400 cm/s PV Mean grad:  4.0 mmHg AV VTI:            0.284 m AV Peak Grad:      8.9 mmHg AV Mean Grad:      4.0 mmHg LVOT Vmax:         78.30 cm/s LVOT Vmean:        53.100 cm/s LVOT VTI:          0.121 m LVOT/AV VTI ratio: 0.43  AORTA Ao Root diam: 3.30 cm MITRAL VALVE MV Area (PHT): 4.31 cm     SHUNTS MV Peak grad:  3.5 mmHg     Systemic VTI:  0.12 m MV Mean grad:  1.0 mmHg     Systemic Diam: 1.80 cm MV Vmax:       0.94 m/s MV Vmean:      51.2 cm/s MV Decel Time: 176 msec MV E velocity: 78.10 cm/s MV A velocity: 105.00 cm/s MV E/A ratio:  0.74 Bartholome Bill MD Electronically signed by Bartholome Bill MD Signature Date/Time: 05/17/2019/12:53:42 PM    Final         Scheduled Meds: . arformoterol  15 mcg Nebulization BID  . aspirin  81 mg Oral Daily  . budesonide (PULMICORT) nebulizer solution  0.25 mg Nebulization BID  . enoxaparin (LOVENOX) injection  40 mg Subcutaneous Q24H  . [START ON 05/18/2019] hydrochlorothiazide  25 mg Oral Daily  . ipratropium-albuterol  3 mL Nebulization Q6H  . methylPREDNISolone (SOLU-MEDROL) injection  40 mg Intravenous Q8H  . mirabegron ER  25 mg Oral Daily   Continuous Infusions:   LOS: 1 day    Time spent: 35  minutes    Sidney Ace, MD [facility] Pager [phone] If 7PM-7AM, please contact night-coverage 05/17/2019, 2:50 PM

## 2019-05-17 NOTE — Progress Notes (Signed)
*  PRELIMINARY RESULTS* Echocardiogram 2D Echocardiogram has been performed.  Abigail Hill 05/17/2019, 11:53 AM

## 2019-05-17 NOTE — Progress Notes (Signed)
OVERNIGHT With admission assessment to floor SWOT and patient care nurse found patient to be difficult to arouse "obtunded" and ABG showed chronic compensated acidosis.Patient denies similar events.  She does endorse "feeling confused whe she "woke up with this event.  Her altered state was not long and she completely returned to baseline.  Glucose normal at time of event.    CT head:  IMPRESSION: 1. No acute intracranial pathology. 2. Interval progression of hydrocephalus with dilatation of the lateral and third ventricles. 3. Progression of periventricular and white matter hypodensities. Findings may represent a combination of chronic microvascular ischemic changes and transependymal edema. MRI may provide better evaluation.  Patient was placed on CPAP .  She will need further evaluation with pulmonology, as she likely has sleep apnea as well as severe emphysema contributing to her chronic repiratory failure  Follow up MRI has been ordered as indicated by CT

## 2019-05-18 ENCOUNTER — Encounter: Payer: Self-pay | Admitting: Family Medicine

## 2019-05-18 MED ORDER — IPRATROPIUM-ALBUTEROL 0.5-2.5 (3) MG/3ML IN SOLN
3.0000 mL | Freq: Three times a day (TID) | RESPIRATORY_TRACT | Status: DC
  Administered 2019-05-18 – 2019-05-19 (×4): 3 mL via RESPIRATORY_TRACT
  Filled 2019-05-18 (×4): qty 3

## 2019-05-18 MED ORDER — FUROSEMIDE 20 MG PO TABS
20.0000 mg | ORAL_TABLET | Freq: Two times a day (BID) | ORAL | Status: DC
  Administered 2019-05-18 – 2019-05-19 (×2): 20 mg via ORAL
  Filled 2019-05-18 (×2): qty 1

## 2019-05-18 NOTE — Progress Notes (Signed)
Progress Note    Abigail Hill  [id number] DOB: 1951/02/24  DOA: 05/16/2019 PCP: Baxter Hire, MD      Brief Narrative:    Medical records reviewed and are as summarized below:  Abigail Hill is an 69 y.o. female medical history significant forCVA, unsteady gait of unknown cause, osteoporosis and hypertension who presents with concerns of worsening shortness of breath.   Pt presented to her PCP office on the day of admission for concerns of shortness of breath and was sent to ED for evaluation after she was found to be hypoxic down to 70-80%. Symptoms started about a week prior to admission when she began to notice shortness of breath worse with exertion and improve with rest. Also notes increasing LE edema. Denies orthopnea or PND. No fever. Has mild cough. No sick contact. She was a former smoker but quit 12 years ago but 10 year smoking history.       Assessment/Plan:   Principal Problem:   Acute respiratory failure with hypoxia (HCC) Active Problems:   Essential hypertension   History of CVA (cerebrovascular accident)   Unsteady gait  Acute COPD exacerbation: CT chest showed changes consistent with emphysema.  ABG showed chronic hypercarbia with compensated pH.  Continue steroids and bronchodilators.  Acute hypoxemic respiratory failure/suspect underlying chronic hypoxemic respiratory failure: Oxygen saturation was 86% on room air today.  Continue oxygen via nasal cannula.  Taper off as able.  Check pulse oximetry with ambulation.  Peripheral edema/probable acute diastolic CHF: 2D echo on [id number] showed EF estimated at 65 to [id number], grade 1 diastolic dysfunction.  She was initially on IV Lasix.  Start oral Lasix  Hypertension: Substitute Lasix for HCTZ  History of stroke continue aspirin  Abnormal gait of unclear etiology/chronic communicating hydrocephalus: Outpatient follow-up with [place] [facility].   Body mass index is 28.1 kg/m.   Family  Communication/Anticipated D/C date and plan/Code Status   DVT prophylaxis: Lovenox Code Status: DNR Family Communication: Plan discussed with patient and her husband at the bedside disposition Plan: Patient is from home.  Possible discharge to home tomorrow if hypoxemia improves      Subjective:   C/o dry cough.  She feels she has mucus stuck in her throat.  Shortness of breath is improving.  Objective:    Vitals:   05/18/19 0737 05/18/19 0750 05/18/19 1205 05/18/19 1601  BP:  115/79 113/68 116/63  Pulse: 98 86 84 90  Resp: 16 19 19 19   Temp:  98 F (36.7 C) 98.2 F (36.8 C) 98.2 F (36.8 C)  TempSrc:  Oral Oral Oral  SpO2: (!) 86% 98% 95% 93%  Weight:      Height:        Intake/Output Summary (Last 24 hours) at 05/18/2019 1654 Last data filed at 05/18/2019 1602 Gross per 24 hour  Intake 1310 ml  Output 2500 ml  Net -1190 ml   Filed Weights   05/16/19 2106 05/17/19 0507 05/18/19 0420  Weight: 76.7 kg 72.9 kg 74.3 kg    Exam:  GEN: NAD SKIN: No rash EYES: EOMI ENT: MMM CV: RRR PULM: Decreased air entry bilaterally with no wheezing or rales heard ABD: soft, ND, NT, +BS CNS: AAO x 3, non focal EXT: b/l leg and pedal edema (2+), no tenderness   Data Reviewed:   I have personally reviewed following labs and imaging studies:  Labs: Labs show the following:   Basic Metabolic Panel: Recent Labs  Lab 05/16/19 1514  05/16/19 1514 05/17/19 0054 05/17/19 0344  NA 133*  --  135 138  K 3.7   < > 3.8 3.7  CL 86*  --  85* 87*  CO2 38*  --  40* 39*  GLUCOSE 118*  --  169* 149*  BUN 20  --  17 17  CREATININE 0.72  --  0.63 0.73  CALCIUM 10.0  --  9.2 9.5  MG  --   --  1.9  --    < > = values in this interval not displayed.   GFR Estimated Creatinine Clearance: 63.6 mL/min (by C-G formula based on SCr of 0.73 mg/dL). Liver Function Tests: Recent Labs  Lab 05/16/19 1514  AST 20  ALT 31  ALKPHOS 87  BILITOT 0.9  PROT 8.0  ALBUMIN 4.6   No results  for input(s): LIPASE, AMYLASE in the last 168 hours. No results for input(s): AMMONIA in the last 168 hours. Coagulation profile Recent Labs  Lab 05/16/19 1514  INR 1.0    CBC: Recent Labs  Lab 05/16/19 1514 05/17/19 0344  WBC 7.8 6.1  NEUTROABS 6.3  --   HGB 15.5* 15.2*  HCT 48.1* 46.0  MCV 99.6 97.3  PLT 188 192   Cardiac Enzymes: No results for input(s): CKTOTAL, CKMB, CKMBINDEX, TROPONINI in the last 168 hours. BNP (last 3 results) No results for input(s): PROBNP in the last 8760 hours. CBG: Recent Labs  Lab 05/17/19 0020 05/17/19 1152 05/17/19 1657 05/17/19 2118  GLUCAP 142* 84 109* 138*   D-Dimer: No results for input(s): DDIMER in the last 72 hours. Hgb A1c: No results for input(s): HGBA1C in the last 72 hours. Lipid Profile: No results for input(s): CHOL, HDL, LDLCALC, TRIG, CHOLHDL, LDLDIRECT in the last 72 hours. Thyroid function studies: No results for input(s): TSH, T4TOTAL, T3FREE, THYROIDAB in the last 72 hours.  Invalid input(s): FREET3 Anemia work up: No results for input(s): VITAMINB12, FOLATE, FERRITIN, TIBC, IRON, RETICCTPCT in the last 72 hours. Sepsis Labs: Recent Labs  Lab 05/16/19 1514 05/17/19 0344  WBC 7.8 6.1    Microbiology Recent Results (from the past 240 hour(s))  Respiratory Panel by RT PCR (Flu A&B, Covid) - Nasopharyngeal Swab     Status: None   Collection Time: 05/16/19  4:00 PM   Specimen: Nasopharyngeal Swab  Result Value Ref Range Status   SARS Coronavirus 2 by RT PCR NEGATIVE NEGATIVE Final    Comment: (NOTE) SARS-CoV-2 target nucleic acids are NOT DETECTED. The SARS-CoV-2 RNA is generally detectable in upper respiratoy specimens during the acute phase of infection. The lowest concentration of SARS-CoV-2 viral copies this assay can detect is 131 copies/mL. A negative result does not preclude SARS-Cov-2 infection and should not be used as the sole basis for treatment or other patient management decisions. A  negative result may occur with  improper specimen collection/handling, submission of specimen other than nasopharyngeal swab, presence of viral mutation(s) within the areas targeted by this assay, and inadequate number of viral copies (<131 copies/mL). A negative result must be combined with clinical observations, patient history, and epidemiological information. The expected result is Negative. Fact Sheet for Patients:  PinkCheek.be Fact Sheet for Healthcare Providers:  GravelBags.it This test is not yet ap proved or cleared by the [place] FDA and  has been authorized for detection and/or diagnosis of SARS-CoV-2 by FDA under an Emergency Use Authorization (EUA). This EUA will remain  in effect (meaning this test can be used) for the duration of the  COVID-19 declaration under Section 564(b)(1) of the Act, 21 U.S.C. section 360bbb-3(b)(1), unless the authorization is terminated or revoked sooner.    Influenza A by PCR NEGATIVE NEGATIVE Final   Influenza B by PCR NEGATIVE NEGATIVE Final    Comment: (NOTE) The Xpert Xpress SARS-CoV-2/FLU/RSV assay is intended as an aid in  the diagnosis of influenza from Nasopharyngeal swab specimens and  should not be used as a sole basis for treatment. Nasal washings and  aspirates are unacceptable for Xpert Xpress SARS-CoV-2/FLU/RSV  testing. Fact Sheet for Patients: PinkCheek.be Fact Sheet for Healthcare Providers: GravelBags.it This test is not yet approved or cleared by the [place] FDA and  has been authorized for detection and/or diagnosis of SARS-CoV-2 by  FDA under an Emergency Use Authorization (EUA). This EUA will remain  in effect (meaning this test can be used) for the duration of the  Covid-19 declaration under Section 564(b)(1) of the Act, 21  U.S.C. section 360bbb-3(b)(1), unless the authorization is    terminated or revoked. Performed at [facility], [address] [place], [place] [address]     Procedures and diagnostic studies:  CT HEAD WO CONTRAST  Result Date: 05/17/2019 CLINICAL DATA:  69 year old female with altered mental status. EXAM: CT HEAD WITHOUT CONTRAST TECHNIQUE: Contiguous axial images were obtained from the base of the skull through the vertex without intravenous contrast. COMPARISON:  Head CT dated 04/29/2009. FINDINGS: Brain: There is hydrocephalus with dilatation of the lateral and third ventricles, slightly progressed since the prior CT. The fourth ventricle is unremarkable. There is associated mass effect and effacement of the sulci. There is periventricular and deep white matter chronic microvascular ischemic changes, progressed since the prior CT which may represent a combination of chronic microvascular ischemic changes and transependymal edema. MRI may provide better evaluation. There is no acute intracranial hemorrhage. No midline shift. Vascular: No hyperdense vessel or unexpected calcification. Skull: Normal. Negative for fracture or focal lesion. Sinuses/Orbits: No acute finding. Other: None IMPRESSION: 1. No acute intracranial pathology. 2. Interval progression of hydrocephalus with dilatation of the lateral and third ventricles. 3. Progression of periventricular and white matter hypodensities. Findings may represent a combination of chronic microvascular ischemic changes and transependymal edema. MRI may provide better evaluation. Electronically Signed   By: Anner Crete M.D.   On: 05/17/2019 02:22   CT Angio Chest PE W and/or Wo Contrast  Result Date: 05/16/2019 CLINICAL DATA:  Short of breath. EXAM: CT ANGIOGRAPHY CHEST WITH CONTRAST TECHNIQUE: Multidetector CT imaging of the chest was performed using the standard protocol during bolus administration of intravenous contrast. Multiplanar CT image reconstructions and MIPs were obtained to evaluate the  vascular anatomy. CONTRAST:  61mL OMNIPAQUE IOHEXOL 350 MG/ML SOLN COMPARISON:  None. FINDINGS: Cardiovascular: The heart size appears mildly enlarged. Aortic atherosclerosis. Lad and RCA coronary artery calcifications. The main pulmonary artery appears patent. No central obstructing pulmonary emboli. No lobar or segmental pulmonary artery filling defects. Mediastinum/Nodes: No enlarged mediastinal, hilar, or axillary lymph nodes. Thyroid gland, trachea, and esophagus demonstrate no significant findings. Lungs/Pleura: Centrilobular and paraseptal emphysema. No airspace consolidation, atelectasis or pneumothorax. Upper Abdomen: No acute abnormality. Calcified granuloma identified within the spleen. Musculoskeletal: No chest wall abnormality. No acute or significant osseous findings. Review of the MIP images confirms the above findings. IMPRESSION: 1. No evidence for acute pulmonary embolus. 2. Emphysema and aortic atherosclerosis. 3. Multi vessel coronary artery calcifications. Aortic Atherosclerosis (ICD10-I70.0) and Emphysema (ICD10-J43.9). Electronically Signed   By: Queen Slough.D.  On: 05/16/2019 17:33   MR BRAIN WO CONTRAST  Result Date: 05/17/2019 CLINICAL DATA:  Increased hydrocephalus EXAM: MRI HEAD WITHOUT CONTRAST TECHNIQUE: Multiplanar, multiecho pulse sequences of the brain and surrounding structures were obtained without intravenous contrast. COMPARISON:  MRI 2018, CT 05/17/2019 FINDINGS: Brain: Enlargement of the ventricular system, primarily third and lateral ventricles, is again identified. Caliber is not substantially changed since 2019 MRI. Patchy and confluent T2 hyperintensity in the supratentorial greater than pontine white matter is nonspecific but may reflect stable chronic microvascular ischemic changes potentially with some component of hydrocephalus related interstitial edema. There is no acute infarction or intracranial hemorrhage. There is no intracranial mass, mass effect, or  edema. There is no hydrocephalus or extra-axial fluid collection. Vascular: Major vessel flow voids at the skull base are preserved. Skull and upper cervical spine: Normal marrow signal is preserved. Sinuses/Orbits: Paranasal sinuses are aerated. Orbits are unremarkable. Other: Sella is unremarkable.  Mastoid air cells are clear. IMPRESSION: Chronic communicating hydrocephalus is not substantially changed since 2018. No acute abnormality. Electronically Signed   By: Macy Mis M.D.   On: 05/17/2019 08:08   US Venous Img Lower Bilateral  Result Date: 05/16/2019 CLINICAL DATA:  Shortness of breath.  Rule out DVT. Edema. EXAM: BILATERAL LOWER EXTREMITY VENOUS DOPPLER ULTRASOUND TECHNIQUE: Gray-scale sonography with compression, as well as color and duplex ultrasound, were performed to evaluate the deep venous system(s) from the level of the common femoral vein through the popliteal and proximal calf veins. COMPARISON:  None. FINDINGS: VENOUS Normal compressibility of the common femoral, superficial femoral, and popliteal veins, as well as the visualized calf veins. Visualized portions of profunda femoral vein and great saphenous vein unremarkable. No filling defects to suggest DVT on grayscale or color Doppler imaging. Doppler waveforms show normal direction of venous flow, normal respiratory phasicity and response to augmentation. OTHER Avascular fluid collection in the left popliteal fossa measuring 2.4 x 0.8 x 3.0 cm is most consistent with Baker cyst. Subcutaneous edema in the calfs. Limitations: none IMPRESSION: 1. No evidence of bilateral lower extremity DVT. 2. Left Baker's cyst measuring 2.4 x 0.8 x 3 cm. Electronically Signed   By: Keith Rake M.D.   On: 05/16/2019 19:39   ECHOCARDIOGRAM COMPLETE  Result Date: 05/17/2019    ECHOCARDIOGRAM REPORT   Patient Name:   MADYSYN ANNABLE Date of Exam: 05/17/2019 Medical Rec #:  [id number]   Height:       64.0 in Accession #:    [id number]  Weight:        160.8 lb Date of Birth:  [birth year]   BSA:          1.783 m Patient Age:    35 years    BP:           146/75 mmHg Patient Gender: F           HR:           87 bpm. Exam Location:  [facility] Procedure: 2D Echo, Color Doppler and Cardiac Doppler Indications:     I51.7 Cardiomegaly  History:         Patient has no prior history of Echocardiogram examinations.                  Signs/Symptoms:Leg edema, fatigue; Risk Factors:Hypertension.  Sonographer:     Charmayne Sheer RDCS (AE) Referring Phys:  [id number] Angela Burke TU Diagnosing Phys: Bartholome Bill MD IMPRESSIONS  1. Left ventricular ejection fraction, by estimation, is 65 to  70%. The left ventricle has normal function. The left ventricle has no regional wall motion abnormalities. Left ventricular diastolic parameters are consistent with Grade I diastolic dysfunction (impaired relaxation).  2. Right ventricular systolic function is normal. The right ventricular size is mildly enlarged.  3. The mitral valve was not well visualized. Trivial mitral valve regurgitation.  4. The aortic valve is tricuspid. Aortic valve regurgitation is not visualized. No aortic stenosis is present. FINDINGS  Left Ventricle: Left ventricular ejection fraction, by estimation, is 65 to 70%. The left ventricle has normal function. The left ventricle has no regional wall motion abnormalities. The left ventricular internal cavity size was normal in size. There is  borderline left ventricular hypertrophy. Left ventricular diastolic parameters are consistent with Grade I diastolic dysfunction (impaired relaxation). Right Ventricle: The right ventricular size is mildly enlarged. No increase in right ventricular wall thickness. Right ventricular systolic function is normal. Left Atrium: Left atrial size was normal in size. Right Atrium: Right atrial size was normal in size. Pericardium: There is no evidence of pericardial effusion. Mitral Valve: The mitral valve was not well visualized. Trivial mitral valve  regurgitation. MV peak gradient, 3.5 mmHg. The mean mitral valve gradient is 1.0 mmHg. Tricuspid Valve: The tricuspid valve is grossly normal. Tricuspid valve regurgitation is trivial. Aortic Valve: The aortic valve is tricuspid. Aortic valve regurgitation is not visualized. No aortic stenosis is present. Aortic valve mean gradient measures 4.0 mmHg. Aortic valve peak gradient measures 8.9 mmHg. Aortic valve area, by VTI measures 1.08 cm. Pulmonic Valve: The pulmonic valve was not well visualized. Pulmonic valve regurgitation is trivial. Aorta: The aortic root is normal in size and structure. IAS/Shunts: The atrial septum is grossly normal.  LEFT VENTRICLE PLAX 2D LVIDd:         4.63 cm  Diastology LVIDs:         2.69 cm  LV e' lateral:   8.38 cm/s LV PW:         0.95 cm  LV E/e' lateral: 9.3 LV IVS:        0.57 cm  LV e' medial:    8.27 cm/s LVOT diam:     1.80 cm  LV E/e' medial:  9.4 LV SV:         31 LV SV Index:   17 LVOT Area:     2.54 cm  LEFT ATRIUM             Index LA diam:        3.20 cm 1.79 cm/m LA Vol (A2C):   40.2 ml 22.54 ml/m LA Vol (A4C):   33.9 ml 19.01 ml/m LA Biplane Vol: 37.4 ml 20.97 ml/m  AORTIC VALVE                   PULMONIC VALVE AV Area (Vmax):    1.34 cm    PV Vmax:       1.14 m/s AV Area (Vmean):   1.40 cm    PV Vmean:      91.000 cm/s AV Area (VTI):     1.08 cm    PV VTI:        0.259 m AV Vmax:           149.00 cm/s PV Peak grad:  5.2 mmHg AV Vmean:          96.400 cm/s PV Mean grad:  4.0 mmHg AV VTI:            0.284 m  AV Peak Grad:      8.9 mmHg AV Mean Grad:      4.0 mmHg LVOT Vmax:         78.30 cm/s LVOT Vmean:        53.100 cm/s LVOT VTI:          0.121 m LVOT/AV VTI ratio: 0.43  AORTA Ao Root diam: 3.30 cm MITRAL VALVE MV Area (PHT): 4.31 cm     SHUNTS MV Peak grad:  3.5 mmHg     Systemic VTI:  0.12 m MV Mean grad:  1.0 mmHg     Systemic Diam: 1.80 cm MV Vmax:       0.94 m/s MV Vmean:      51.2 cm/s MV Decel Time: 176 msec MV E velocity: 78.10 cm/s MV A velocity:  105.00 cm/s MV E/A ratio:  0.74 Bartholome Bill MD Electronically signed by Bartholome Bill MD Signature Date/Time: 05/17/2019/12:53:42 PM    Final     Medications:   . arformoterol  15 mcg Nebulization BID  . aspirin  81 mg Oral Daily  . budesonide (PULMICORT) nebulizer solution  0.25 mg Nebulization BID  . enoxaparin (LOVENOX) injection  40 mg Subcutaneous Q24H  . furosemide  20 mg Oral BID  . guaiFENesin  600 mg Oral BID  . ipratropium-albuterol  3 mL Nebulization TID  . methylPREDNISolone (SOLU-MEDROL) injection  40 mg Intravenous Q8H  . mirabegron ER  25 mg Oral Daily   Continuous Infusions:   LOS: 2 days   Kaylub Detienne  [facility]     05/18/2019, 4:54 PM

## 2019-05-18 NOTE — Plan of Care (Signed)

## 2019-05-19 ENCOUNTER — Ambulatory Visit: Payer: PPO | Admitting: Physical Therapy

## 2019-05-19 DIAGNOSIS — J9611 Chronic respiratory failure with hypoxia: Secondary | ICD-10-CM | POA: Diagnosis present

## 2019-05-19 LAB — BASIC METABOLIC PANEL
Anion gap: 12 (ref 5–15)
BUN: 36 mg/dL — ABNORMAL HIGH (ref 8–23)
CO2: 36 mmol/L — ABNORMAL HIGH (ref 22–32)
Calcium: 10.1 mg/dL (ref 8.9–10.3)
Chloride: 89 mmol/L — ABNORMAL LOW (ref 98–111)
Creatinine, Ser: 0.87 mg/dL (ref 0.44–1.00)
GFR calc Af Amer: >60 mL/min (ref >60)
GFR calc non Af Amer: >60 mL/min (ref >60)
Glucose, Bld: 122 mg/dL — ABNORMAL HIGH (ref 70–99)
Potassium: 3.9 mmol/L (ref 3.5–5.1)
Sodium: 137 mmol/L (ref 135–145)

## 2019-05-19 MED ORDER — PREDNISONE 20 MG PO TABS: 40.0000 mg | ORAL_TABLET | Freq: Every day | ORAL | 0 refills | Status: AC

## 2019-05-19 MED ORDER — ANORO ELLIPTA 62.5-25 MCG/INH IN AEPB: 1.0000 | INHALATION_SPRAY | Freq: Every day | RESPIRATORY_TRACT | 0 refills | Status: DC

## 2019-05-19 MED ORDER — FUROSEMIDE 20 MG PO TABS: 20.0000 mg | ORAL_TABLET | Freq: Two times a day (BID) | ORAL | 0 refills | Status: DC

## 2019-05-19 MED ORDER — ALBUTEROL SULFATE (2.5 MG/3ML) 0.083% IN NEBU: 2.5000 mg | INHALATION_SOLUTION | Freq: Four times a day (QID) | RESPIRATORY_TRACT | 0 refills | Status: DC | PRN

## 2019-05-19 MED ORDER — ALBUTEROL SULFATE HFA 108 (90 BASE) MCG/ACT IN AERS: 2.0000 | INHALATION_SPRAY | Freq: Four times a day (QID) | RESPIRATORY_TRACT | 0 refills | Status: DC | PRN

## 2019-05-19 NOTE — Care Management Important Message (Signed)
Important Message  Patient Details  Name: Abigail Hill MRN: [id number] Date of Birth: [birth year]   Medicare Important Message Given:  Yes     Dannette Barbara 05/19/2019, 1:15 PM

## 2019-05-19 NOTE — Discharge Summary (Addendum)
Physician Discharge Summary  Abigail Hill [id number] DOB: 10-Sep-1950 DOA: 05/16/2019  PCP: Baxter Hire, MD  Admit date: 05/16/2019 Discharge date: 05/21/2019  Discharge disposition: Home with home health care   Recommendations for Outpatient Follow-Up:   Outpatient follow-up with PCP in 1 week.   Discharge Diagnosis:   Principal Problem:   COPD with acute exacerbation ([place]) Active Problems:   Acute respiratory failure with hypoxia (HCC)   Essential hypertension   History of CVA (cerebrovascular accident)   Unsteady gait   Chronic respiratory failure with hypoxia ([place])    Discharge Condition: Stable.  Diet recommendation: Low-salt diet  Code status: DNR.    Hospital Course:   Abigail Hill is an 69 y.o. female medical history significant forCVA, unsteady gait of unknown cause, osteoporosis and hypertension who presents with concerns of worsening shortness of breath.   Pt presented to her PCP office on the day of admission for concerns of shortness of breath and was sent to ED for evaluation after she was found to be hypoxic down to 70-80%. Symptoms started about a week prior to admission when she began to notice shortness of breath worse with exertion and improve with rest she also had increasing lower extremity edema. She was a former smoker but quit 12 years ago.   She was admitted to the hospital for acute COPD exacerbation and probable acute diastolic CHF. She said she was not aware of COPD and CHF diagnosis. CT scan showed findings consistent with emphysema. She was treated with bronchodilators and IV Lasix. She also required oxygen for acute on chronic hypoxemic respiratory failure. Unfortunately, she could not be weaned off of oxygen. Oxygen saturation at rest was 86% on room air. She was deemed to be a candidate for home oxygen. Her condition has improved and she is deemed stable for discharge to home.     Discharge Exam:   Vitals:   05/19/19 1337  05/19/19 2340  BP:    Pulse: 97   Resp: 18   Temp:    SpO2: 92% 91%   Vitals:   05/19/19 1120 05/19/19 1318 05/19/19 1337 05/19/19 2340  BP: 126/69 133/72    Pulse: 96 98 97   Resp: 19 20 18    Temp: 98.1 F (36.7 C)     TempSrc: Oral     SpO2: 93% 93% 92% 91%  Weight:      Height:         GEN: NAD SKIN: No rash EYES: EOMI ENT: MMM CV: RRR PULM: CTA B ABD: soft, ND, NT, +BS CNS: AAO x 3, non focal EXT: No edema or tenderness   The results of significant diagnostics from this hospitalization (including imaging, microbiology, ancillary and laboratory) are listed below for reference.     Procedures and Diagnostic Studies:   CT HEAD WO CONTRAST  Result Date: 05/17/2019 CLINICAL DATA:  69 year old female with altered mental status. EXAM: CT HEAD WITHOUT CONTRAST TECHNIQUE: Contiguous axial images were obtained from the base of the skull through the vertex without intravenous contrast. COMPARISON:  Head CT dated 04/29/2009. FINDINGS: Brain: There is hydrocephalus with dilatation of the lateral and third ventricles, slightly progressed since the prior CT. The fourth ventricle is unremarkable. There is associated mass effect and effacement of the sulci. There is periventricular and deep white matter chronic microvascular ischemic changes, progressed since the prior CT which may represent a combination of chronic microvascular ischemic changes and transependymal edema. MRI may provide better evaluation. There is  no acute intracranial hemorrhage. No midline shift. Vascular: No hyperdense vessel or unexpected calcification. Skull: Normal. Negative for fracture or focal lesion. Sinuses/Orbits: No acute finding. Other: None IMPRESSION: 1. No acute intracranial pathology. 2. Interval progression of hydrocephalus with dilatation of the lateral and third ventricles. 3. Progression of periventricular and white matter hypodensities. Findings may represent a combination of chronic microvascular  ischemic changes and transependymal edema. MRI may provide better evaluation. Electronically Signed   By: Anner Crete M.D.   On: 05/17/2019 02:22   MR BRAIN WO CONTRAST  Result Date: 05/17/2019 CLINICAL DATA:  Increased hydrocephalus EXAM: MRI HEAD WITHOUT CONTRAST TECHNIQUE: Multiplanar, multiecho pulse sequences of the brain and surrounding structures were obtained without intravenous contrast. COMPARISON:  MRI 2018, CT 05/17/2019 FINDINGS: Brain: Enlargement of the ventricular system, primarily third and lateral ventricles, is again identified. Caliber is not substantially changed since 2019 MRI. Patchy and confluent T2 hyperintensity in the supratentorial greater than pontine white matter is nonspecific but may reflect stable chronic microvascular ischemic changes potentially with some component of hydrocephalus related interstitial edema. There is no acute infarction or intracranial hemorrhage. There is no intracranial mass, mass effect, or edema. There is no hydrocephalus or extra-axial fluid collection. Vascular: Major vessel flow voids at the skull base are preserved. Skull and upper cervical spine: Normal marrow signal is preserved. Sinuses/Orbits: Paranasal sinuses are aerated. Orbits are unremarkable. Other: Sella is unremarkable.  Mastoid air cells are clear. IMPRESSION: Chronic communicating hydrocephalus is not substantially changed since 2018. No acute abnormality. Electronically Signed   By: Macy Mis M.D.   On: 05/17/2019 08:08   ECHOCARDIOGRAM COMPLETE  Result Date: 05/17/2019    ECHOCARDIOGRAM REPORT   Patient Name:   Abigail Hill Date of Exam: 05/17/2019 Medical Rec #:  [id number]   Height:       64.0 in Accession #:    [id number]  Weight:       160.8 lb Date of Birth:  January 21, 1951   BSA:          1.783 m Patient Age:    56 years    BP:           146/75 mmHg Patient Gender: F           HR:           87 bpm. Exam Location:  [facility] Procedure: 2D Echo, Color Doppler and Cardiac Doppler  Indications:     I51.7 Cardiomegaly  History:         Patient has no prior history of Echocardiogram examinations.                  Signs/Symptoms:Leg edema, fatigue; Risk Factors:Hypertension.  Sonographer:     Charmayne Sheer RDCS (AE) Referring Phys:  [id number] Angela Burke TU Diagnosing Phys: Bartholome Bill MD IMPRESSIONS  1. Left ventricular ejection fraction, by estimation, is 65 to 70%. The left ventricle has normal function. The left ventricle has no regional wall motion abnormalities. Left ventricular diastolic parameters are consistent with Grade I diastolic dysfunction (impaired relaxation).  2. Right ventricular systolic function is normal. The right ventricular size is mildly enlarged.  3. The mitral valve was not well visualized. Trivial mitral valve regurgitation.  4. The aortic valve is tricuspid. Aortic valve regurgitation is not visualized. No aortic stenosis is present. FINDINGS  Left Ventricle: Left ventricular ejection fraction, by estimation, is 65 to 70%. The left ventricle has normal function. The left ventricle has no regional wall motion abnormalities.  The left ventricular internal cavity size was normal in size. There is  borderline left ventricular hypertrophy. Left ventricular diastolic parameters are consistent with Grade I diastolic dysfunction (impaired relaxation). Right Ventricle: The right ventricular size is mildly enlarged. No increase in right ventricular wall thickness. Right ventricular systolic function is normal. Left Atrium: Left atrial size was normal in size. Right Atrium: Right atrial size was normal in size. Pericardium: There is no evidence of pericardial effusion. Mitral Valve: The mitral valve was not well visualized. Trivial mitral valve regurgitation. MV peak gradient, 3.5 mmHg. The mean mitral valve gradient is 1.0 mmHg. Tricuspid Valve: The tricuspid valve is grossly normal. Tricuspid valve regurgitation is trivial. Aortic Valve: The aortic valve is tricuspid. Aortic valve  regurgitation is not visualized. No aortic stenosis is present. Aortic valve mean gradient measures 4.0 mmHg. Aortic valve peak gradient measures 8.9 mmHg. Aortic valve area, by VTI measures 1.08 cm. Pulmonic Valve: The pulmonic valve was not well visualized. Pulmonic valve regurgitation is trivial. Aorta: The aortic root is normal in size and structure. IAS/Shunts: The atrial septum is grossly normal.  LEFT VENTRICLE PLAX 2D LVIDd:         4.63 cm  Diastology LVIDs:         2.69 cm  LV e' lateral:   8.38 cm/s LV PW:         0.95 cm  LV E/e' lateral: 9.3 LV IVS:        0.57 cm  LV e' medial:    8.27 cm/s LVOT diam:     1.80 cm  LV E/e' medial:  9.4 LV SV:         31 LV SV Index:   17 LVOT Area:     2.54 cm  LEFT ATRIUM             Index LA diam:        3.20 cm 1.79 cm/m LA Vol (A2C):   40.2 ml 22.54 ml/m LA Vol (A4C):   33.9 ml 19.01 ml/m LA Biplane Vol: 37.4 ml 20.97 ml/m  AORTIC VALVE                   PULMONIC VALVE AV Area (Vmax):    1.34 cm    PV Vmax:       1.14 m/s AV Area (Vmean):   1.40 cm    PV Vmean:      91.000 cm/s AV Area (VTI):     1.08 cm    PV VTI:        0.259 m AV Vmax:           149.00 cm/s PV Peak grad:  5.2 mmHg AV Vmean:          96.400 cm/s PV Mean grad:  4.0 mmHg AV VTI:            0.284 m AV Peak Grad:      8.9 mmHg AV Mean Grad:      4.0 mmHg LVOT Vmax:         78.30 cm/s LVOT Vmean:        53.100 cm/s LVOT VTI:          0.121 m LVOT/AV VTI ratio: 0.43  AORTA Ao Root diam: 3.30 cm MITRAL VALVE MV Area (PHT): 4.31 cm     SHUNTS MV Peak grad:  3.5 mmHg     Systemic VTI:  0.12 m MV Mean grad:  1.0 mmHg  Systemic Diam: 1.80 cm MV Vmax:       0.94 m/s MV Vmean:      51.2 cm/s MV Decel Time: 176 msec MV E velocity: 78.10 cm/s MV A velocity: 105.00 cm/s MV E/A ratio:  0.74 Bartholome Bill MD Electronically signed by Bartholome Bill MD Signature Date/Time: 05/17/2019/12:53:42 PM    Final      Labs:   Basic Metabolic Panel: Recent Labs  Lab 05/16/19 1514 05/16/19 1514 05/17/19 0054  05/17/19 0054 05/17/19 0344 05/19/19 0611  NA 133*  --  135  --  138 137  K 3.7   < > 3.8   < > 3.7 3.9  CL 86*  --  85*  --  87* 89*  CO2 38*  --  40*  --  39* 36*  GLUCOSE 118*  --  169*  --  149* 122*  BUN 20  --  17  --  17 36*  CREATININE 0.72  --  0.63  --  0.73 0.87  CALCIUM 10.0  --  9.2  --  9.5 10.1  MG  --   --  1.9  --   --   --    < > = values in this interval not displayed.   GFR Estimated Creatinine Clearance: 58.4 mL/min (by C-G formula based on SCr of 0.87 mg/dL). Liver Function Tests: Recent Labs  Lab 05/16/19 1514  AST 20  ALT 31  ALKPHOS 87  BILITOT 0.9  PROT 8.0  ALBUMIN 4.6   No results for input(s): LIPASE, AMYLASE in the last 168 hours. No results for input(s): AMMONIA in the last 168 hours. Coagulation profile Recent Labs  Lab 05/16/19 1514  INR 1.0    CBC: Recent Labs  Lab 05/16/19 1514 05/17/19 0344  WBC 7.8 6.1  NEUTROABS 6.3  --   HGB 15.5* 15.2*  HCT 48.1* 46.0  MCV 99.6 97.3  PLT 188 192   Cardiac Enzymes: No results for input(s): CKTOTAL, CKMB, CKMBINDEX, TROPONINI in the last 168 hours. BNP: Invalid input(s): POCBNP CBG: Recent Labs  Lab 05/17/19 0020 05/17/19 1152 05/17/19 1657 05/17/19 2118  GLUCAP 142* 84 109* 138*   D-Dimer No results for input(s): DDIMER in the last 72 hours. Hgb A1c No results for input(s): HGBA1C in the last 72 hours. Lipid Profile No results for input(s): CHOL, HDL, LDLCALC, TRIG, CHOLHDL, LDLDIRECT in the last 72 hours. Thyroid function studies No results for input(s): TSH, T4TOTAL, T3FREE, THYROIDAB in the last 72 hours.  Invalid input(s): FREET3 Anemia work up No results for input(s): VITAMINB12, FOLATE, FERRITIN, TIBC, IRON, RETICCTPCT in the last 72 hours. Microbiology Recent Results (from the past 240 hour(s))  Respiratory Panel by RT PCR (Flu A&B, Covid) - Nasopharyngeal Swab     Status: None   Collection Time: 05/16/19  4:00 PM   Specimen: Nasopharyngeal Swab  Result Value  Ref Range Status   SARS Coronavirus 2 by RT PCR NEGATIVE NEGATIVE Final    Comment: (NOTE) SARS-CoV-2 target nucleic acids are NOT DETECTED. The SARS-CoV-2 RNA is generally detectable in upper respiratoy specimens during the acute phase of infection. The lowest concentration of SARS-CoV-2 viral copies this assay can detect is 131 copies/mL. A negative result does not preclude SARS-Cov-2 infection and should not be used as the sole basis for treatment or other patient management decisions. A negative result may occur with  improper specimen collection/handling, submission of specimen other than nasopharyngeal swab, presence of viral mutation(s) within the areas targeted by  this assay, and inadequate number of viral copies (<131 copies/mL). A negative result must be combined with clinical observations, patient history, and epidemiological information. The expected result is Negative. Fact Sheet for Patients:  PinkCheek.be Fact Sheet for Healthcare Providers:  GravelBags.it This test is not yet ap proved or cleared by the [place] FDA and  has been authorized for detection and/or diagnosis of SARS-CoV-2 by FDA under an Emergency Use Authorization (EUA). This EUA will remain  in effect (meaning this test can be used) for the duration of the COVID-19 declaration under Section 564(b)(1) of the Act, 21 U.S.C. section 360bbb-3(b)(1), unless the authorization is terminated or revoked sooner.    Influenza A by PCR NEGATIVE NEGATIVE Final   Influenza B by PCR NEGATIVE NEGATIVE Final    Comment: (NOTE) The Xpert Xpress SARS-CoV-2/FLU/RSV assay is intended as an aid in  the diagnosis of influenza from Nasopharyngeal swab specimens and  should not be used as a sole basis for treatment. Nasal washings and  aspirates are unacceptable for Xpert Xpress SARS-CoV-2/FLU/RSV  testing. Fact Sheet for  Patients: PinkCheek.be Fact Sheet for Healthcare Providers: GravelBags.it This test is not yet approved or cleared by the [place] FDA and  has been authorized for detection and/or diagnosis of SARS-CoV-2 by  FDA under an Emergency Use Authorization (EUA). This EUA will remain  in effect (meaning this test can be used) for the duration of the  Covid-19 declaration under Section 564(b)(1) of the Act, 21  U.S.C. section 360bbb-3(b)(1), unless the authorization is  terminated or revoked. Performed at [facility], [address] [place], [place] [address]      Discharge Instructions:   Discharge Instructions    Diet - low sodium heart healthy   Complete by: As directed    For home use only DME Nebulizer machine   Complete by: As directed    Patient needs a nebulizer to treat with the following condition: COPD (chronic obstructive pulmonary disease) ([place])   Length of Need: Lifetime   For home use only DME oxygen   Complete by: As directed    Oxygen saturation at rest was 86% on room air   Length of Need: Lifetime   Mode or (Route): Nasal cannula   Liters per Minute: 2   Frequency: Continuous (stationary and portable oxygen unit needed)   Oxygen delivery system: Gas   Increase activity slowly   Complete by: As directed      Allergies as of 05/19/2019      Reactions   Oxycodone       Medication List    STOP taking these medications   hydrochlorothiazide 12.5 MG tablet Commonly known as: HYDRODIURIL     TAKE these medications   albuterol 108 (90 Base) MCG/ACT inhaler Commonly known as: VENTOLIN HFA Inhale 2 puffs into the lungs every 6 (six) hours as needed for wheezing or shortness of breath.   albuterol (2.5 MG/3ML) 0.083% nebulizer solution Commonly known as: PROVENTIL Take 3 mLs (2.5 mg total) by nebulization every 6 (six) hours as needed for wheezing or shortness of breath.   alendronate  70 MG tablet Commonly known as: FOSAMAX Take 70 mg by mouth every Monday.   Anoro Ellipta 62.5-25 MCG/INH Aepb Generic drug: umeclidinium-vilanterol Inhale 1 puff into the lungs daily.   aspirin 81 MG chewable tablet Chew 81 mg by mouth daily.   CENTRUM SILVER PO Take 1 tablet by mouth daily.   furosemide 20 MG tablet Commonly known as: LASIX  Take 1 tablet (20 mg total) by mouth 2 (two) times daily.   Myrbetriq 25 MG Tb24 tablet Generic drug: mirabegron ER TAKE 1 TABLET BY MOUTH EVERY DAY What changed: how much to take   [place] Calcium 500-400 MG-UNIT Tabs Take 1 tablet by mouth daily.   predniSONE 20 MG tablet Commonly known as: DELTASONE Take 2 tablets (40 mg total) by mouth daily for 2 days.            Durable Medical Equipment  (From admission, onward)         Start     Ordered   05/19/19 0000  For home use only DME oxygen    Comments: Oxygen saturation at rest was 86% on room air  Question Answer Comment  Length of Need Lifetime   Mode or (Route) Nasal cannula   Liters per Minute 2   Frequency Continuous (stationary and portable oxygen unit needed)   Oxygen delivery system Gas      05/19/19 1141   05/19/19 0000  For home use only DME Nebulizer machine    Question Answer Comment  Patient needs a nebulizer to treat with the following condition COPD (chronic obstructive pulmonary disease) ([place])   Length of Need Lifetime      05/19/19 1141         Follow-up Information    Baxter Hire, MD.   Specialty: Internal Medicine Why: Patient will make own appointment Contact information: [place] [address] [phone]            Time coordinating discharge: 33 minutes  Signed:  Jennye Boroughs  [facility] 05/21/2019, 6:23 PM

## 2019-05-19 NOTE — Progress Notes (Signed)
Pt's husband comes to Nurses station and says his wife is in the floor and he cant get her up. RN and NS found pt sitting in floor beside bed. Chair alarm was sounding. Pt reports no injury or pain, VSS. Pt reports she was trying to show her husband that she could walk to the bathroom and the yellow socks she had on caused her to fall. RN and NS assist patient up to bed. Pt has discharge pending. MD notified. No new orders at this time.  MD states if no injury RN may proceed with discharge. Husband is at bedside and was present when patient "sat down" on the floor. I will continue to assess.

## 2019-05-19 NOTE — Progress Notes (Signed)
SATURATION QUALIFICATIONS: (This note is used to comply with regulatory documentation for home oxygen)  Patient Saturations on Room Air at Rest = 86%  Patient Saturations on Room Air while Ambulating = n/a%  Patient Saturations on 2 Liters of oxygen while resting =  90%  Please briefly explain why patient needs home oxygen:

## 2019-05-19 NOTE — Evaluation (Signed)
Physical Therapy Evaluation Patient Details Name: BAYLEE MALANGA MRN: [id number] DOB: 1950-12-28 Today's Date: 05/19/2019   History of Present Illness  presented to ER secondary to SOB, LE edema; admitted for manamgenet of hypoxic respiratory failure related to undiagnosed COPD vs newly diagnosed CHF.  Clinical Impression  Upon evaluation, patient alert and oriented; follows commands and very eager for OOB activities.  Bilat UE/LE generally weak and deconditioned , but strength and ROM grossly functional for basic transfers and gait.  Able to complete bed mobility with sup; sit/stand, basic transfers and gait (3') with RW, min/mod assist.  Demonstrates very short, festinating stepping pattern; poor balance with consisent posterior LOB (min/mod assist to correct). Applied heel wedge to bilat shoes for balance correction-standing balance and gait performance improved to cga/min assist with RW.  Notable difference in ability to correct and maintain midline/balance in A/P plane.  Encourage/educated in use of shoes (with bilat heel wedges in place) with all standing, transfer and gait activities. O2 monitored throughout session; patient requiring 3L supplemental O2 with exertion to maintain sats >90%.  RN informed/aware. Would benefit from skilled PT to address above deficits and promote optimal return to PLOF; Recommend transition to [facility] upon discharge from acute hospitalization.     Follow Up Recommendations Home health PT;Supervision/Assistance - 24 hour    Equipment Recommendations       Recommendations for Other Services       Precautions / Restrictions Precautions Precautions: Fall Restrictions Weight Bearing Restrictions: No      Mobility  Bed Mobility Overal bed mobility: Needs Assistance Bed Mobility: Supine to Sit     Supine to sit: Supervision     General bed mobility comments: increased time, use of bedrails to copmlete  Transfers Overall transfer level: Needs  assistance Equipment used: Rolling walker (2 wheeled) Transfers: Sit to/from Stand Sit to Stand: Min assist         General transfer comment: cuing for hand placement to prevent pulling on RW; assist for lift off, anterior weight translation.  Tends to use posterior surface of bilat LEs against edge of bed to assist with lift off and stabilization  Ambulation/Gait Ambulation/Gait assistance: Min assist Gait Distance (Feet): 3 Feet Assistive device: Rolling walker (2 wheeled)       General Gait Details: very short, festinating stepping pattern; poor balance with consisent posterior LOB (min/mod assist to correct)  Stairs            Wheelchair Mobility    Modified Rankin (Stroke Patients Only)       Balance Overall balance assessment: Needs assistance Sitting-balance support: No upper extremity supported;Feet supported Sitting balance-Leahy Scale: Normal     Standing balance support: Bilateral upper extremity supported Standing balance-Leahy Scale: Poor Standing balance comment: constant posterior LOB without bilat UE support on counter/firm support surface                             Pertinent Vitals/Pain Pain Assessment: No/denies pain    Home Living Family/patient expects to be discharged to:: Private residence Living Arrangements: Spouse/significant other Available Help at Discharge: Family Type of Home: House Home Access: Stairs to enter   Technical brewer of Steps: 1 Home Layout: One level Home Equipment: Walker - 2 wheels      Prior Function Level of Independence: Independent with assistive device(s)         Comments: Sup/mod indep for ADLs, household mobilization with RW; does use manual  WC for longer, community distances.  Was recently participation with outpatient PT services; discontinued for participation with movement clinic     Hand Dominance        Extremity/Trunk Assessment   Upper Extremity Assessment Upper  Extremity Assessment: Overall WFL for tasks assessed    Lower Extremity Assessment Lower Extremity Assessment: Generalized weakness(grossly 3-/5 to 4-/5 throughout)       Communication   Communication: No difficulties  Cognition Arousal/Alertness: Awake/alert Behavior During Therapy: WFL for tasks assessed/performed Overall Cognitive Status: Within Functional Limits for tasks assessed                                 General Comments: slightly impulsive, but redirectable      General Comments      Exercises Other Exercises Other Exercises: Standing balance activities to promote anteriro weight translation and balance correction, min/mod assist (incorporating dynamic reaching and postural control/correction).  Patient able to identify posterior LOB, but unable to self-correct withoutUE support and min/mod assist from therapist. Other Exercises: Applied heel wedge to bilat shoes for balance correction-standing balance and gait performance improved to cga/min assist with RW.  Notable difference in ability to correct and maintain midline/balance in A/P plane.  Encourage/educated in use of shoes (with bilat heel wedges in place) with all standing, transfer and gait activities.   Assessment/Plan    PT Assessment Patient needs continued PT services  PT Problem List Decreased strength;Decreased range of motion;Decreased activity tolerance;Decreased mobility;Decreased coordination;Decreased balance;Decreased safety awareness;Decreased knowledge of use of DME;Decreased knowledge of precautions;Cardiopulmonary status limiting activity       PT Treatment Interventions DME instruction;Stair training;Therapeutic activities;Balance training;Cognitive remediation;Gait training;Functional mobility training;Therapeutic exercise;Neuromuscular re-education;Patient/family education    PT Goals (Current goals can be found in the Care Plan section)  Acute Rehab PT Goals Patient Stated  Goal: eager to participate with PT; anxious for discharge home PT Goal Formulation: With patient Time For Goal Achievement: 06/02/19 Potential to Achieve Goals: Good    Frequency Min 2X/week   Barriers to discharge        Co-evaluation               AM-PAC PT "6 Clicks" Mobility  Outcome Measure Help needed turning from your back to your side while in a flat bed without using bedrails?: None Help needed moving from lying on your back to sitting on the side of a flat bed without using bedrails?: None Help needed moving to and from a bed to a chair (including a wheelchair)?: A Little Help needed standing up from a chair using your arms (e.g., wheelchair or bedside chair)?: A Lot Help needed to walk in hospital room?: A Lot Help needed climbing 3-5 steps with a railing? : A Lot 6 Click Score: 17    End of Session Equipment Utilized During Treatment: Gait belt Activity Tolerance: Patient tolerated treatment well Patient left: in bed;with call bell/phone within reach;with bed alarm set Nurse Communication: Mobility status PT Visit Diagnosis: Muscle weakness (generalized) (M62.81);Difficulty in walking, not elsewhere classified (R26.2);Other symptoms and signs involving the nervous system (R29.898)    Time: [id number] PT Time Calculation (min) (ACUTE ONLY): 40 min   Charges:   PT Evaluation $PT Eval Moderate Complexity: 1 Mod PT Treatments $Therapeutic Activity: 8-22 mins $Neuromuscular Re-education: 8-22 mins       Dallas Scorsone H. Owens Shark, PT, DPT, NCS 05/19/19, 11:56 PM [phone]

## 2019-05-19 NOTE — Progress Notes (Signed)
It was reported by CNA and RN that bed alarm sounded.  Pt was found to be oob with Purewick on the floor, urine on floor, CPAP off and pt confused.  Pt was assisted back to bed.  Bed alarm put on again and pt instructed not to get oob without assist.  Pt now alert and oriented and verbalized understand.  RN reported that this same thing happened last night as well.

## 2019-05-21 DIAGNOSIS — J441 Chronic obstructive pulmonary disease with (acute) exacerbation: Secondary | ICD-10-CM | POA: Diagnosis present

## 2019-05-24 ENCOUNTER — Ambulatory Visit: Payer: PPO | Admitting: Physical Therapy

## 2019-05-25 DIAGNOSIS — J9611 Chronic respiratory failure with hypoxia: Secondary | ICD-10-CM | POA: Diagnosis not present

## 2019-05-25 DIAGNOSIS — I10 Essential (primary) hypertension: Secondary | ICD-10-CM | POA: Diagnosis not present

## 2019-05-25 DIAGNOSIS — Z09 Encounter for follow-up examination after completed treatment for conditions other than malignant neoplasm: Secondary | ICD-10-CM | POA: Diagnosis not present

## 2019-05-25 DIAGNOSIS — R350 Frequency of micturition: Secondary | ICD-10-CM | POA: Diagnosis not present

## 2019-05-25 DIAGNOSIS — I5031 Acute diastolic (congestive) heart failure: Secondary | ICD-10-CM | POA: Diagnosis not present

## 2019-05-25 DIAGNOSIS — J432 Centrilobular emphysema: Secondary | ICD-10-CM | POA: Diagnosis not present

## 2019-05-26 ENCOUNTER — Ambulatory Visit: Payer: PPO | Admitting: Physical Therapy

## 2019-05-31 ENCOUNTER — Ambulatory Visit: Payer: PPO | Admitting: Physical Therapy

## 2019-06-02 ENCOUNTER — Ambulatory Visit: Payer: PPO | Admitting: Physical Therapy

## 2019-06-06 DIAGNOSIS — S4992XA Unspecified injury of left shoulder and upper arm, initial encounter: Secondary | ICD-10-CM | POA: Diagnosis not present

## 2019-06-06 DIAGNOSIS — I25119 Atherosclerotic heart disease of native coronary artery with unspecified angina pectoris: Secondary | ICD-10-CM | POA: Diagnosis not present

## 2019-06-06 DIAGNOSIS — J432 Centrilobular emphysema: Secondary | ICD-10-CM | POA: Diagnosis not present

## 2019-06-06 DIAGNOSIS — S42022A Displaced fracture of shaft of left clavicle, initial encounter for closed fracture: Secondary | ICD-10-CM | POA: Diagnosis not present

## 2019-06-06 DIAGNOSIS — S42002A Fracture of unspecified part of left clavicle, initial encounter for closed fracture: Secondary | ICD-10-CM | POA: Diagnosis not present

## 2019-06-06 DIAGNOSIS — J9611 Chronic respiratory failure with hypoxia: Secondary | ICD-10-CM | POA: Diagnosis not present

## 2019-06-06 DIAGNOSIS — R29898 Other symptoms and signs involving the musculoskeletal system: Secondary | ICD-10-CM | POA: Diagnosis not present

## 2019-06-06 DIAGNOSIS — I639 Cerebral infarction, unspecified: Secondary | ICD-10-CM | POA: Diagnosis not present

## 2019-06-06 DIAGNOSIS — I10 Essential (primary) hypertension: Secondary | ICD-10-CM | POA: Diagnosis not present

## 2019-06-06 DIAGNOSIS — W010XXA Fall on same level from slipping, tripping and stumbling without subsequent striking against object, initial encounter: Secondary | ICD-10-CM | POA: Diagnosis not present

## 2019-06-13 DIAGNOSIS — M25512 Pain in left shoulder: Secondary | ICD-10-CM | POA: Diagnosis not present

## 2019-06-13 DIAGNOSIS — S42022A Displaced fracture of shaft of left clavicle, initial encounter for closed fracture: Secondary | ICD-10-CM | POA: Diagnosis not present

## 2019-06-15 DIAGNOSIS — S42022A Displaced fracture of shaft of left clavicle, initial encounter for closed fracture: Secondary | ICD-10-CM | POA: Diagnosis not present

## 2019-06-15 DIAGNOSIS — Z1331 Encounter for screening for depression: Secondary | ICD-10-CM | POA: Diagnosis not present

## 2019-06-15 DIAGNOSIS — J449 Chronic obstructive pulmonary disease, unspecified: Secondary | ICD-10-CM | POA: Diagnosis not present

## 2019-06-16 DIAGNOSIS — H5203 Hypermetropia, bilateral: Secondary | ICD-10-CM | POA: Diagnosis not present

## 2019-06-16 DIAGNOSIS — H524 Presbyopia: Secondary | ICD-10-CM | POA: Diagnosis not present

## 2019-06-16 DIAGNOSIS — H2513 Age-related nuclear cataract, bilateral: Secondary | ICD-10-CM | POA: Diagnosis not present

## 2019-06-16 DIAGNOSIS — H31123 Diffuse secondary atrophy of choroid, bilateral: Secondary | ICD-10-CM | POA: Diagnosis not present

## 2019-06-16 DIAGNOSIS — H52223 Regular astigmatism, bilateral: Secondary | ICD-10-CM | POA: Diagnosis not present

## 2019-06-19 DIAGNOSIS — J438 Other emphysema: Secondary | ICD-10-CM | POA: Diagnosis not present

## 2019-06-19 DIAGNOSIS — J449 Chronic obstructive pulmonary disease, unspecified: Secondary | ICD-10-CM | POA: Diagnosis not present

## 2019-07-01 ENCOUNTER — Institutional Professional Consult (permissible substitution): Payer: PPO | Admitting: Pulmonary Disease

## 2019-07-06 DIAGNOSIS — G912 (Idiopathic) normal pressure hydrocephalus: Secondary | ICD-10-CM | POA: Diagnosis not present

## 2019-07-06 DIAGNOSIS — J432 Centrilobular emphysema: Secondary | ICD-10-CM | POA: Diagnosis not present

## 2019-07-06 DIAGNOSIS — J9611 Chronic respiratory failure with hypoxia: Secondary | ICD-10-CM | POA: Diagnosis not present

## 2019-07-06 DIAGNOSIS — S42022A Displaced fracture of shaft of left clavicle, initial encounter for closed fracture: Secondary | ICD-10-CM | POA: Diagnosis not present

## 2019-07-06 DIAGNOSIS — I10 Essential (primary) hypertension: Secondary | ICD-10-CM | POA: Diagnosis not present

## 2019-07-07 DIAGNOSIS — I25119 Atherosclerotic heart disease of native coronary artery with unspecified angina pectoris: Secondary | ICD-10-CM | POA: Diagnosis not present

## 2019-07-11 DIAGNOSIS — I639 Cerebral infarction, unspecified: Secondary | ICD-10-CM | POA: Diagnosis not present

## 2019-07-11 DIAGNOSIS — I10 Essential (primary) hypertension: Secondary | ICD-10-CM | POA: Diagnosis not present

## 2019-07-11 DIAGNOSIS — J432 Centrilobular emphysema: Secondary | ICD-10-CM | POA: Diagnosis not present

## 2019-07-11 DIAGNOSIS — M7989 Other specified soft tissue disorders: Secondary | ICD-10-CM | POA: Diagnosis not present

## 2019-07-12 DIAGNOSIS — Z01818 Encounter for other preprocedural examination: Secondary | ICD-10-CM | POA: Diagnosis not present

## 2019-07-12 DIAGNOSIS — J432 Centrilobular emphysema: Secondary | ICD-10-CM | POA: Diagnosis not present

## 2019-07-12 DIAGNOSIS — J439 Emphysema, unspecified: Secondary | ICD-10-CM | POA: Diagnosis not present

## 2019-07-19 DIAGNOSIS — J438 Other emphysema: Secondary | ICD-10-CM | POA: Diagnosis not present

## 2019-07-19 DIAGNOSIS — J449 Chronic obstructive pulmonary disease, unspecified: Secondary | ICD-10-CM | POA: Diagnosis not present

## 2019-07-22 DIAGNOSIS — L218 Other seborrheic dermatitis: Secondary | ICD-10-CM | POA: Diagnosis not present

## 2019-07-22 DIAGNOSIS — X32XXXA Exposure to sunlight, initial encounter: Secondary | ICD-10-CM | POA: Diagnosis not present

## 2019-07-22 DIAGNOSIS — D2261 Melanocytic nevi of right upper limb, including shoulder: Secondary | ICD-10-CM | POA: Diagnosis not present

## 2019-07-22 DIAGNOSIS — D2262 Melanocytic nevi of left upper limb, including shoulder: Secondary | ICD-10-CM | POA: Diagnosis not present

## 2019-07-22 DIAGNOSIS — L570 Actinic keratosis: Secondary | ICD-10-CM | POA: Diagnosis not present

## 2019-07-22 DIAGNOSIS — Z85828 Personal history of other malignant neoplasm of skin: Secondary | ICD-10-CM | POA: Diagnosis not present

## 2019-07-22 DIAGNOSIS — L821 Other seborrheic keratosis: Secondary | ICD-10-CM | POA: Diagnosis not present

## 2019-07-22 DIAGNOSIS — D2272 Melanocytic nevi of left lower limb, including hip: Secondary | ICD-10-CM | POA: Diagnosis not present

## 2019-07-28 DIAGNOSIS — J449 Chronic obstructive pulmonary disease, unspecified: Secondary | ICD-10-CM | POA: Diagnosis not present

## 2019-08-24 DIAGNOSIS — S42022A Displaced fracture of shaft of left clavicle, initial encounter for closed fracture: Secondary | ICD-10-CM | POA: Diagnosis not present

## 2019-08-26 ENCOUNTER — Ambulatory Visit: Payer: PPO | Admitting: Neurology

## 2019-08-28 DIAGNOSIS — J449 Chronic obstructive pulmonary disease, unspecified: Secondary | ICD-10-CM | POA: Diagnosis not present

## 2019-09-06 NOTE — Progress Notes (Signed)
09/07/2019 1:13 PM   Abigail Hill 04/04/1950 [id number]  Referring provider: Baxter Hire, MD [place],  [place] [address] Chief Complaint  Patient presents with  . Follow-up    HPI: Abigail Hill is a 69 y.o. female with chronic left UPJ obstruction with severe left hydronephrosis as well as urge incontinence on Myrbetriq who is seen for a 1 year follow up.   Since last year, she has been having lots of issues.  She was admitted with hypoxia and then ended up having a fall on a clavicular fracture.  She has been placed on Lasix which have exacerbated her urinary symptoms.  She is now down to only 20 mg and on fluid restriction.  Patient has elected for conservative management of her chronic UPJ obstruction.  Creatinine remains stable/normal.  No flank pain.  She reports she has been having increasing urgency, urge incontinence and daytime frequency.  She has to rush to the bathroom.  She often does not make it there on time.  She is wearing pads as well as depends.  She has been going very dissatisfied with her urinary symptoms.  Initially, she had some improvement with Myrbetriq 25 mg but this does not seem to be helping as much anymore.  She does report that her urine is a little bit cloudy but she has no dysuria.   PVR was 18 mL today.  She is some irritation in her groin as per previous visits.  She is improved in the past with antifungal steroid cream.  She is requesting a prescription to fill for this.  PMH: Past Medical History:  Diagnosis Date  . Abnormal gait 12/18/2015  . BP (high blood pressure) 08/24/2015  . Cancer ([place])    skin  . Constriction of ureter (postoperative) 07/28/2015  . Hydronephrosis 07/14/2015  . Hypertension   . Left foot drop 12/18/2015  . Recurrent UTI   . UPJ (ureteropelvic junction) obstruction 07/28/2015  . UTI (lower urinary tract infection) 07/14/2015    Surgical History: Past Surgical History:  Procedure Laterality  Date  . broken foot  2012  . COLONOSCOPY WITH PROPOFOL N/A 06/30/2017   Procedure: COLONOSCOPY WITH PROPOFOL;  Surgeon: Lollie Sails, MD;  Location: [facility] ENDOSCOPY;  Service: Endoscopy;  Laterality: N/A;  . kidney repair  1985   repaired torn blood vessel in kidney    Home Medications:  Allergies as of 09/07/2019      Reactions   Oxycodone       Medication List       Accurate as of September 07, 2019  1:13 PM. If you have any questions, ask your nurse or doctor.        albuterol 108 (90 Base) MCG/ACT inhaler Commonly known as: VENTOLIN HFA Inhale 2 puffs into the lungs every 6 (six) hours as needed for wheezing or shortness of breath.   albuterol (2.5 MG/3ML) 0.083% nebulizer solution Commonly known as: PROVENTIL Take 3 mLs (2.5 mg total) by nebulization every 6 (six) hours as needed for wheezing or shortness of breath.   alendronate 70 MG tablet Commonly known as: FOSAMAX Take 70 mg by mouth every Monday.   Anoro Ellipta 62.5-25 MCG/INH Aepb Generic drug: umeclidinium-vilanterol Inhale 1 puff into the lungs daily.   aspirin 81 MG chewable tablet Chew 81 mg by mouth daily.   CENTRUM SILVER PO Take 1 tablet by mouth daily.   furosemide 20 MG tablet Commonly known as: LASIX Take 1 tablet (20  mg total) by mouth 2 (two) times daily.   ketoconazole 2 % shampoo Commonly known as: NIZORAL Apply topically 3 (three) times a week.   mirabegron ER 50 MG Tb24 tablet Commonly known as: MYRBETRIQ Take 1 tablet (50 mg total) by mouth daily. What changed:   medication strength  how much to take Changed by: Hollice Espy, MD   nystatin-triamcinolone ointment Commonly known as: MYCOLOG Apply 1 application topically 2 (two) times daily. Started by: Hollice Espy, MD   [place] Calcium 500-400 MG-UNIT Tabs Take 1 tablet by mouth daily.   torsemide 20 MG tablet Commonly known as: DEMADEX Take 20 mg by mouth daily.       Allergies:  Allergies  Allergen  Reactions  . Oxycodone     Family History: Family History  Problem Relation Age of Onset  . Kidney disease Neg Hx   . Bladder Cancer Neg Hx     Social History:  reports that she has quit smoking. She has never used smokeless tobacco. She reports that she does not drink alcohol and does not use drugs.   Physical Exam: BP (!) 143/79 (BP Location: Left Arm, Patient Position: Sitting, Cuff Size: Large)   Pulse 92   Ht 5\' 3"  (1.6 m)   Wt 163 lb (73.9 kg)   BMI 28.87 kg/m   Constitutional:  Alert and oriented, No acute distress.  In wheelchair today.  Accompanied by husband. HEENT: [place] AT, moist mucus membranes.  Trachea midline, no masses. Cardiovascular: No clubbing, cyanosis, or edema. Respiratory: Normal respiratory effort, no increased work of breathing. GI: Abdomen is soft, nontender, nondistended, no abdominal masses Skin: No rashes, bruises or suspicious lesions. Neurologic: Grossly intact, no focal deficits, moving all 4 extremities. Psychiatric: Normal mood and affect. Extremity: 2+ lower extremity edema  Laboratory Data:  Lab Results  Component Value Date   CREATININE 0.87 05/19/2019     Pertinent Imaging: Results for orders placed or performed in visit on 09/07/19  Bladder Scan (Post Void Residual) in office  Result Value Ref Range   Scan Result 18      Assessment & Plan:    1. Chronic UPJ obstruction chronic UPJ obstruction with hydronephrosis- asymptomatic Will continue to manage and monitor conservatively, please review previous note.  Kidney function is stable/ normal  2. Urge incontinence Will increase Myrbetriq to 50 mg x 1 month and patient will call back to update on symptom improvement.  Symptoms are likely exacerbated by lasix. We discussed additional behavioral modification If her symptoms fail to resolve, we discussed pathway for treatment of refractory urinary symptoms  I discussed botox vs PTNS as options if higher dose of Myrbetriq fails  to improve her symptoms Not a good candidate for anticholinergics given her multiple medical comorbidities  3.  Inguinal fold candidiasis Will prescribe mycolog cream to use no longer than 1 month to help clear up her rash and then recommend a barrier cream thereafter   Follow up in 1 year with PVR unless she persues botox the return in 1 month.   [facility] [address], [place], [place] [address] [phone]  I, Selena Batten, am acting as a scribe for Dr. Hollice Espy.  You have a ureteral stent in place.  This is a tube that extends from your kidney to your bladder.  This may cause urinary bleeding, burning with urination, and urinary frequency.  Please call our office or present to the ED if you develop fevers >101  or pain which is not able to be controlled with oral pain medications.  You may be given either Flomax and/ or ditropan to help with bladder spasms and stent pain in addition to pain medications.    I have reviewed the above documentation for accuracy and completeness, and I agree with the above.   Hollice Espy, MD  I spent 40 total minutes on the day of the encounter including pre-visit review of the medical record, face-to-face time with the patient, and post visit ordering of labs/imaging/tests.

## 2019-09-07 ENCOUNTER — Other Ambulatory Visit: Payer: Self-pay

## 2019-09-07 ENCOUNTER — Encounter: Payer: Self-pay | Admitting: Urology

## 2019-09-07 ENCOUNTER — Ambulatory Visit: Payer: PPO | Admitting: Urology

## 2019-09-07 VITALS — BP 143/79 | HR 92 | Ht 63.0 in | Wt 163.0 lb

## 2019-09-07 DIAGNOSIS — B379 Candidiasis, unspecified: Secondary | ICD-10-CM | POA: Diagnosis not present

## 2019-09-07 DIAGNOSIS — N135 Crossing vessel and stricture of ureter without hydronephrosis: Secondary | ICD-10-CM

## 2019-09-07 DIAGNOSIS — N3941 Urge incontinence: Secondary | ICD-10-CM

## 2019-09-07 LAB — BLADDER SCAN AMB NON-IMAGING: Scan Result: 18

## 2019-09-07 MED ORDER — NYSTATIN-TRIAMCINOLONE 100000-0.1 UNIT/GM-% EX OINT: 1.0000 "application " | TOPICAL_OINTMENT | Freq: Two times a day (BID) | CUTANEOUS | 0 refills | Status: DC

## 2019-09-07 MED ORDER — MIRABEGRON ER 50 MG PO TB24: 50.0000 mg | ORAL_TABLET | Freq: Every day | ORAL | 11 refills | Status: DC

## 2019-09-15 DIAGNOSIS — J449 Chronic obstructive pulmonary disease, unspecified: Secondary | ICD-10-CM | POA: Diagnosis not present

## 2019-09-17 ENCOUNTER — Other Ambulatory Visit: Payer: Self-pay | Admitting: Urology

## 2019-09-20 ENCOUNTER — Telehealth: Payer: Self-pay | Admitting: *Deleted

## 2019-09-20 MED ORDER — MIRABEGRON ER 50 MG PO TB24: 50.0000 mg | ORAL_TABLET | Freq: Every day | ORAL | 11 refills | Status: DC

## 2019-09-20 NOTE — Telephone Encounter (Signed)
Patient called to give symptom update. Myrbetriq 50mg  has been improving her symptoms, requested refill. Sent to pharmacy.

## 2019-09-27 DIAGNOSIS — J449 Chronic obstructive pulmonary disease, unspecified: Secondary | ICD-10-CM | POA: Diagnosis not present

## 2019-09-27 DIAGNOSIS — I10 Essential (primary) hypertension: Secondary | ICD-10-CM | POA: Diagnosis not present

## 2019-10-06 DIAGNOSIS — J9611 Chronic respiratory failure with hypoxia: Secondary | ICD-10-CM | POA: Diagnosis not present

## 2019-10-06 DIAGNOSIS — M810 Age-related osteoporosis without current pathological fracture: Secondary | ICD-10-CM | POA: Diagnosis not present

## 2019-10-06 DIAGNOSIS — R269 Unspecified abnormalities of gait and mobility: Secondary | ICD-10-CM | POA: Diagnosis not present

## 2019-10-06 DIAGNOSIS — J432 Centrilobular emphysema: Secondary | ICD-10-CM | POA: Diagnosis not present

## 2019-10-06 DIAGNOSIS — I10 Essential (primary) hypertension: Secondary | ICD-10-CM | POA: Diagnosis not present

## 2019-10-28 ENCOUNTER — Encounter: Payer: PPO | Attending: Pulmonary Disease | Admitting: *Deleted

## 2019-10-28 ENCOUNTER — Other Ambulatory Visit: Payer: Self-pay

## 2019-10-28 DIAGNOSIS — J449 Chronic obstructive pulmonary disease, unspecified: Secondary | ICD-10-CM | POA: Insufficient documentation

## 2019-10-28 NOTE — Progress Notes (Signed)
Initial telephone encounter completed. Diagnosis can be found in HCL 7/15. EP orientation scheduled for 8/31 at 1pm.

## 2019-11-01 ENCOUNTER — Other Ambulatory Visit: Payer: Self-pay

## 2019-11-01 ENCOUNTER — Ambulatory Visit: Payer: PPO

## 2019-11-01 VITALS — Ht 61.25 in | Wt 158.3 lb

## 2019-11-01 DIAGNOSIS — J449 Chronic obstructive pulmonary disease, unspecified: Secondary | ICD-10-CM

## 2019-11-01 NOTE — Patient Instructions (Signed)
Patient Instructions  Patient Details  Name: Abigail Hill MRN: [id number] Date of Birth: 10-24-1950 Referring Provider:  Ottie Glazier, MD  Below are your personal goals for exercise, nutrition, and risk factors. Our goal is to help you stay on track towards obtaining and maintaining these goals. We will be discussing your progress on these goals with you throughout the program.  Initial Exercise Prescription:  Initial Exercise Prescription - 11/01/19 1400      Date of Initial Exercise RX and Referring Provider   Date 11/01/19    Referring Provider Aleskerov      Treadmill   MPH 0.4    Grade 0    Minutes 15    METs 1      Recumbant Bike   Level 1    RPM 60    Minutes 15    METs 1      NuStep   Level 1    SPM 80    Minutes 15    METs 1      Prescription Details   Frequency (times per week) 3    Duration Progress to 30 minutes of continuous aerobic without signs/symptoms of physical distress      Intensity   THRR 40-80% of Max Heartrate 111-137    Ratings of Perceived Exertion 11-15    Perceived Dyspnea 0-4      Resistance Training   Training Prescription Yes    Weight 2 lb    Reps 10-15           Exercise Goals: Frequency: Be able to perform aerobic exercise two to three times per week in program working toward 2-5 days per week of home exercise.  Intensity: Work with a perceived exertion of 11 (fairly light) - 15 (hard) while following your exercise prescription.  We will make changes to your prescription with you as you progress through the program.   Duration: Be able to do 30 to 45 minutes of continuous aerobic exercise in addition to a 5 minute warm-up and a 5 minute cool-down routine.   Nutrition Goals: Your personal nutrition goals will be established when you do your nutrition analysis with the dietician.  The following are general nutrition guidelines to follow: Cholesterol < 200mg /day Sodium < 1500mg /day Fiber: Women over 50 yrs - 21 grams per  day  Personal Goals:  Personal Goals and Risk Factors at Admission - 11/01/19 1444      Core Components/Risk Factors/Patient Goals on Admission    Weight Management Yes    Intervention Weight Management: Develop a combined nutrition and exercise program designed to reach desired caloric intake, while maintaining appropriate intake of nutrient and fiber, sodium and fats, and appropriate energy expenditure required for the weight goal.;Weight Management: Provide education and appropriate resources to help participant work on and attain dietary goals.    Admit Weight 158 lb 4.8 oz (71.8 kg)    Goal Weight: Short Term 155 lb (70.3 kg)    Goal Weight: Long Term 155 lb (70.3 kg)    Expected Outcomes Weight Maintenance: Understanding of the daily nutrition guidelines, which includes 25-35% calories from fat, 7% or less cal from saturated fats, less than 200mg  cholesterol, less than 1.5gm of sodium, & 5 or more servings of fruits and vegetables daily    Hypertension Yes    Intervention Provide education on lifestyle modifcations including regular physical activity/exercise, weight management, moderate sodium restriction and increased consumption of fresh fruit, vegetables, and low fat dairy, alcohol moderation, and smoking  cessation.;Monitor prescription use compliance.    Expected Outcomes Long Term: Maintenance of blood pressure at goal levels.;Short Term: Continued assessment and intervention until BP is < 140/32mm HG in hypertensive participants. < 130/16mm HG in hypertensive participants with diabetes, heart failure or chronic kidney disease.           Tobacco Use Initial Evaluation: Social History   Tobacco Use  Smoking Status Former Smoker  Smokeless Tobacco Never Used  Tobacco Comment   quit 10 years    Exercise Goals and Review:  Exercise Goals    Row Name 11/01/19 1440             Exercise Goals   Increase Physical Activity Yes       Intervention Provide advice, education,  support and counseling about physical activity/exercise needs.;Develop an individualized exercise prescription for aerobic and resistive training based on initial evaluation findings, risk stratification, comorbidities and participant's personal goals.       Expected Outcomes Short Term: Attend rehab on a regular basis to increase amount of physical activity.;Long Term: Add in home exercise to make exercise part of routine and to increase amount of physical activity.       Increase Strength and Stamina Yes       Intervention Provide advice, education, support and counseling about physical activity/exercise needs.;Develop an individualized exercise prescription for aerobic and resistive training based on initial evaluation findings, risk stratification, comorbidities and participant's personal goals.       Expected Outcomes Short Term: Increase workloads from initial exercise prescription for resistance, speed, and METs.;Short Term: Perform resistance training exercises routinely during rehab and add in resistance training at home;Long Term: Improve cardiorespiratory fitness, muscular endurance and strength as measured by increased METs and functional capacity (6MWT)       Able to understand and use rate of perceived exertion (RPE) scale Yes       Intervention Provide education and explanation on how to use RPE scale       Expected Outcomes Short Term: Able to use RPE daily in rehab to express subjective intensity level;Long Term:  Able to use RPE to guide intensity level when exercising independently       Able to understand and use Dyspnea scale Yes       Intervention Provide education and explanation on how to use Dyspnea scale       Expected Outcomes Short Term: Able to use Dyspnea scale daily in rehab to express subjective sense of shortness of breath during exertion;Long Term: Able to use Dyspnea scale to guide intensity level when exercising independently       Knowledge and understanding of Target  Heart Rate Range (THRR) Yes       Intervention Provide education and explanation of THRR including how the numbers were predicted and where they are located for reference       Expected Outcomes Short Term: Able to state/look up THRR;Short Term: Able to use daily as guideline for intensity in rehab;Long Term: Able to use THRR to govern intensity when exercising independently       Able to check pulse independently Yes       Intervention Provide education and demonstration on how to check pulse in carotid and radial arteries.;Review the importance of being able to check your own pulse for safety during independent exercise       Expected Outcomes Short Term: Able to explain why pulse checking is important during independent exercise;Long Term: Able to check pulse independently and  accurately       Understanding of Exercise Prescription Yes       Intervention Provide education, explanation, and written materials on patient's individual exercise prescription       Expected Outcomes Short Term: Able to explain program exercise prescription;Long Term: Able to explain home exercise prescription to exercise independently              Copy of goals given to participant.

## 2019-11-01 NOTE — Progress Notes (Signed)
Pulmonary Individual Treatment Plan  Patient Details  Name: Abigail Hill MRN: [id number] Date of Birth: 06-18-1950 Referring Provider:     Pulmonary Rehab from 11/01/2019 in [facility] Cardiac and Pulmonary Rehab  Referring Provider Aleskerov      Initial Encounter Date:    Pulmonary Rehab from 11/01/2019 in [facility] Cardiac and Pulmonary Rehab  Date 11/01/19      Visit Diagnosis: Chronic obstructive pulmonary disease, unspecified COPD type ([place])  Patient's Home Medications on Admission:  Current Outpatient Medications:  .  albuterol (PROVENTIL) (2.5 MG/3ML) 0.083% nebulizer solution, Take 3 mLs (2.5 mg total) by nebulization every 6 (six) hours as needed for wheezing or shortness of breath., Disp: 360 mL, Rfl: 0 .  albuterol (VENTOLIN HFA) 108 (90 Base) MCG/ACT inhaler, Inhale 2 puffs into the lungs every 6 (six) hours as needed for wheezing or shortness of breath. (Patient not taking: Reported on 09/07/2019), Disp: 8 g, Rfl: 0 .  alendronate (FOSAMAX) 70 MG tablet, Take 70 mg by mouth every Monday.  (Patient not taking: Reported on 09/07/2019), Disp: , Rfl:  .  aspirin 81 MG chewable tablet, Chew 81 mg by mouth daily., Disp: , Rfl:  .  Calcium Carb-Cholecalciferol (OYSTER SHELL CALCIUM) 500-400 MG-UNIT TABS, Take 1 tablet by mouth daily.  (Patient not taking: Reported on 10/28/2019), Disp: , Rfl:  .  furosemide (LASIX) 20 MG tablet, Take 1 tablet (20 mg total) by mouth 2 (two) times daily. (Patient not taking: Reported on 10/28/2019), Disp: 60 tablet, Rfl: 0 .  ketoconazole (NIZORAL) 2 % shampoo, Apply topically 3 (three) times a week. (Patient not taking: Reported on 10/28/2019), Disp: , Rfl:  .  mirabegron ER (MYRBETRIQ) 50 MG TB24 tablet, Take 1 tablet (50 mg total) by mouth daily., Disp: 30 tablet, Rfl: 11 .  Multiple Vitamins-Minerals (CENTRUM SILVER PO), Take 1 tablet by mouth daily.  (Patient not taking: Reported on 10/28/2019), Disp: , Rfl:  .  nystatin-triamcinolone ointment (MYCOLOG), Apply 1  application topically 2 (two) times daily. (Patient not taking: Reported on 10/28/2019), Disp: 30 g, Rfl: 0 .  torsemide (DEMADEX) 20 MG tablet, Take 20 mg by mouth daily. (Patient not taking: Reported on 10/28/2019), Disp: , Rfl:  .  umeclidinium-vilanterol (ANORO ELLIPTA) 62.5-25 MCG/INH AEPB, Inhale 1 puff into the lungs daily. (Patient not taking: Reported on 09/07/2019), Disp: 60 each, Rfl: 0  Past Medical History: Past Medical History:  Diagnosis Date  . Abnormal gait 12/18/2015  . BP (high blood pressure) 08/24/2015  . Cancer ([place])    skin  . Constriction of ureter (postoperative) 07/28/2015  . Hydronephrosis 07/14/2015  . Hypertension   . Left foot drop 12/18/2015  . Recurrent UTI   . UPJ (ureteropelvic junction) obstruction 07/28/2015  . UTI (lower urinary tract infection) 07/14/2015    Tobacco Use: Social History   Tobacco Use  Smoking Status Former Smoker  Smokeless Tobacco Never Used  Tobacco Comment   quit 10 years    Labs: Recent Review Scientist, physiological    Labs for ITP Cardiac and Pulmonary Rehab Latest Ref Rng & Units 05/16/2019   PHART 7.35 - 7.45 7.38   PCO2ART 32 - 48 mmHg 88(HH)   HCO3 20.0 - 28.0 mmol/L 52.1(H)   O2SAT % 92.6       Pulmonary Assessment Scores:  Pulmonary Assessment Scores    Row Name 11/01/19 1431         ADL UCSD   SOB Score total 54     Rest 0  Walk 3     Bath 0     Dress 1     Shop 5       CAT Score   CAT Score 18            UCSD: Self-administered rating of dyspnea associated with activities of daily living (ADLs) 6-point scale (0 = "not at all" to 5 = "maximal or unable to do because of breathlessness")  Scoring Scores range from 0 to 120.  Minimally important difference is 5 units  CAT: CAT can identify the health impairment of COPD patients and is better correlated with disease progression.  CAT has a scoring range of zero to 40. The CAT score is classified into four groups of low (less than 10), medium (10 - 20),  high (21-30) and very high (31-40) based on the impact level of disease on health status. A CAT score over 10 suggests significant symptoms.  A worsening CAT score could be explained by an exacerbation, poor medication adherence, poor inhaler technique, or progression of COPD or comorbid conditions.  CAT MCID is 2 points  mMRC: mMRC (Modified Medical Research Council) Dyspnea Scale is used to assess the degree of baseline functional disability in patients of respiratory disease due to dyspnea. No minimal important difference is established. A decrease in score of 1 point or greater is considered a positive change.   Pulmonary Function Assessment:  Pulmonary Function Assessment - 11/01/19 1430      Pulmonary Function Tests   FVC% 76 %    FEV1% 45 %    FEV1/FVC Ratio 47    RV% 77 %    DLCO% 61 %           Exercise Target Goals: Exercise Program Goal: Individual exercise prescription set using results from initial 6 min walk test and THRR while considering  patient's activity barriers and safety.   Exercise Prescription Goal: Initial exercise prescription builds to 30-45 minutes a day of aerobic activity, 2-3 days per week.  Home exercise guidelines will be given to patient during program as part of exercise prescription that the participant will acknowledge.  Education: Aerobic Exercise & Resistance Training: - Gives group verbal and written instruction on the various components of exercise. Focuses on aerobic and resistive training programs and the benefits of this training and how to safely progress through these programs..   Education: Exercise & Equipment Safety: - Individual verbal instruction and demonstration of equipment use and safety with use of the equipment.   Pulmonary Rehab from 11/01/2019 in [facility] Cardiac and Pulmonary Rehab  Date 11/01/19  Educator AS  Instruction Review Code 1- Verbalizes Understanding      Education: Exercise Physiology & General Exercise  Guidelines: - Group verbal and written instruction with models to review the exercise physiology of the cardiovascular system and associated critical values. Provides general exercise guidelines with specific guidelines to those with heart or lung disease.    Education: Flexibility, Balance, Mind/Body Relaxation: Provides group verbal/written instruction on the benefits of flexibility and balance training, including mind/body exercise modes such as yoga, pilates and tai chi.  Demonstration and skill practice provided.   Activity Barriers & Risk Stratification:  Activity Barriers & Cardiac Risk Stratification - 10/28/19 1415      Activity Barriers & Cardiac Risk Stratification   Activity Barriers Balance Concerns;Assistive Device;Other (comment);Muscular Weakness    Comments broken collar bone easter 2020- no restrictions           6 Minute Walk:  6 Minute Walk    Row Name 11/01/19 1434         6 Minute Walk   Phase Initial     Distance 195 feet     Walk Time 6 minutes     # of Rest Breaks 0     MPH 0.37     METS 0.9     VO2 Peak 3.11     Symptoms No     Resting HR 87 bpm     Resting BP 122/66     Resting Oxygen Saturation  94 %     Exercise Oxygen Saturation  during 6 min walk 85 %     Max Ex. HR 112 bpm     Max Ex. BP 140/62     2 Minute Post BP 130/64       Interval HR   1 Minute HR 101     2 Minute HR 100     3 Minute HR 108     4 Minute HR 110     5 Minute HR 110     6 Minute HR 112     2 Minute Post HR 96     Interval Heart Rate? Yes       Interval Oxygen   Interval Oxygen? Yes     Baseline Oxygen Saturation % 94 %     1 Minute Oxygen Saturation % 90 %     1 Minute Liters of Oxygen 0 L     2 Minute Oxygen Saturation % 88 %     2 Minute Liters of Oxygen 0 L     3 Minute Oxygen Saturation % 87 %     3 Minute Liters of Oxygen 0 L     4 Minute Oxygen Saturation % 85 %     4 Minute Liters of Oxygen 0 L     5 Minute Oxygen Saturation % 87 %     5 Minute  Liters of Oxygen 0 L     6 Minute Oxygen Saturation % 86 %     6 Minute Liters of Oxygen 0 L     2 Minute Post Oxygen Saturation % 90 %     2 Minute Post Liters of Oxygen 0 L           Oxygen Initial Assessment:  Oxygen Initial Assessment - 10/28/19 1410      Home Oxygen   Home Oxygen Device Portable Concentrator    Sleep Oxygen Prescription Continuous    Liters per minute 2    Home Exercise Oxygen Prescription Continuous   O2 is usually above 94% during the day without any oxygen, wears pulse ox and monitors during the day   Liters per minute 2    Home at Rest Exercise Oxygen Prescription Continuous    Liters per minute 2    Compliance with Home Oxygen Use Yes   only wears if O2 sat drops     Intervention   Short Term Goals To learn and exhibit compliance with exercise, home and travel O2 prescription;To learn and understand importance of monitoring SPO2 with pulse oximeter and demonstrate accurate use of the pulse oximeter.;To learn and understand importance of maintaining oxygen saturations>88%;To learn and demonstrate proper pursed lip breathing techniques or other breathing techniques.    Long  Term Goals Exhibits compliance with exercise, home and travel O2 prescription;Maintenance of O2 saturations>88%;Verbalizes importance of monitoring SPO2 with pulse oximeter and return demonstration;Exhibits proper breathing techniques, such  as pursed lip breathing or other method taught during program session           Oxygen Re-Evaluation:   Oxygen Discharge (Final Oxygen Re-Evaluation):   Initial Exercise Prescription:  Initial Exercise Prescription - 11/01/19 1400      Date of Initial Exercise RX and Referring Provider   Date 11/01/19    Referring Provider Aleskerov      Treadmill   MPH 0.4    Grade 0    Minutes 15    METs 1      Recumbant Bike   Level 1    RPM 60    Minutes 15    METs 1      NuStep   Level 1    SPM 80    Minutes 15    METs 1       Prescription Details   Frequency (times per week) 3    Duration Progress to 30 minutes of continuous aerobic without signs/symptoms of physical distress      Intensity   THRR 40-80% of Max Heartrate 111-137    Ratings of Perceived Exertion 11-15    Perceived Dyspnea 0-4      Resistance Training   Training Prescription Yes    Weight 2 lb    Reps 10-15           Perform Capillary Blood Glucose checks as needed.  Exercise Prescription Changes:  Exercise Prescription Changes    Row Name 11/01/19 1400             Response to Exercise   Blood Pressure (Admit) 122/66       Blood Pressure (Exercise) 140/62       Blood Pressure (Exit) 130/64       Heart Rate (Admit) 87 bpm       Heart Rate (Exercise) 112 bpm       Heart Rate (Exit) 96 bpm       Oxygen Saturation (Admit) 94 %       Oxygen Saturation (Exercise) 85 %       Oxygen Saturation (Exit) 90 %       Symptoms none       Comments walk test              Exercise Comments:   Exercise Goals and Review:  Exercise Goals    Row Name 11/01/19 1440             Exercise Goals   Increase Physical Activity Yes       Intervention Provide advice, education, support and counseling about physical activity/exercise needs.;Develop an individualized exercise prescription for aerobic and resistive training based on initial evaluation findings, risk stratification, comorbidities and participant's personal goals.       Expected Outcomes Short Term: Attend rehab on a regular basis to increase amount of physical activity.;Long Term: Add in home exercise to make exercise part of routine and to increase amount of physical activity.       Increase Strength and Stamina Yes       Intervention Provide advice, education, support and counseling about physical activity/exercise needs.;Develop an individualized exercise prescription for aerobic and resistive training based on initial evaluation findings, risk stratification, comorbidities and  participant's personal goals.       Expected Outcomes Short Term: Increase workloads from initial exercise prescription for resistance, speed, and METs.;Short Term: Perform resistance training exercises routinely during rehab and add in resistance training at home;Long Term: Improve cardiorespiratory fitness, muscular endurance  and strength as measured by increased METs and functional capacity (6MWT)       Able to understand and use rate of perceived exertion (RPE) scale Yes       Intervention Provide education and explanation on how to use RPE scale       Expected Outcomes Short Term: Able to use RPE daily in rehab to express subjective intensity level;Long Term:  Able to use RPE to guide intensity level when exercising independently       Able to understand and use Dyspnea scale Yes       Intervention Provide education and explanation on how to use Dyspnea scale       Expected Outcomes Short Term: Able to use Dyspnea scale daily in rehab to express subjective sense of shortness of breath during exertion;Long Term: Able to use Dyspnea scale to guide intensity level when exercising independently       Knowledge and understanding of Target Heart Rate Range (THRR) Yes       Intervention Provide education and explanation of THRR including how the numbers were predicted and where they are located for reference       Expected Outcomes Short Term: Able to state/look up THRR;Short Term: Able to use daily as guideline for intensity in rehab;Long Term: Able to use THRR to govern intensity when exercising independently       Able to check pulse independently Yes       Intervention Provide education and demonstration on how to check pulse in carotid and radial arteries.;Review the importance of being able to check your own pulse for safety during independent exercise       Expected Outcomes Short Term: Able to explain why pulse checking is important during independent exercise;Long Term: Able to check pulse  independently and accurately       Understanding of Exercise Prescription Yes       Intervention Provide education, explanation, and written materials on patient's individual exercise prescription       Expected Outcomes Short Term: Able to explain program exercise prescription;Long Term: Able to explain home exercise prescription to exercise independently              Exercise Goals Re-Evaluation :   Discharge Exercise Prescription (Final Exercise Prescription Changes):  Exercise Prescription Changes - 11/01/19 1400      Response to Exercise   Blood Pressure (Admit) 122/66    Blood Pressure (Exercise) 140/62    Blood Pressure (Exit) 130/64    Heart Rate (Admit) 87 bpm    Heart Rate (Exercise) 112 bpm    Heart Rate (Exit) 96 bpm    Oxygen Saturation (Admit) 94 %    Oxygen Saturation (Exercise) 85 %    Oxygen Saturation (Exit) 90 %    Symptoms none    Comments walk test           Nutrition:  Target Goals: Understanding of nutrition guidelines, daily intake of sodium <1538m, cholesterol <2089m calories 30% from fat and 7% or less from saturated fats, daily to have 5 or more servings of fruits and vegetables.  Education: Controlling Sodium/Reading Food Labels -Group verbal and written material supporting the discussion of sodium use in heart healthy nutrition. Review and explanation with models, verbal and written materials for utilization of the food label.   Education: General Nutrition Guidelines/Fats and Fiber: -Group instruction provided by verbal, written material, models and posters to present the general guidelines for heart healthy nutrition. Gives an explanation and review  of dietary fats and fiber.   Biometrics:  Pre Biometrics - 11/01/19 1442      Pre Biometrics   Height 5' 1.25" (1.556 m)    Weight 158 lb 4.8 oz (71.8 kg)    BMI (Calculated) 29.66            Nutrition Therapy Plan and Nutrition Goals:   Nutrition Assessments:  Nutrition  Assessments - 11/01/19 1433      MEDFICTS Scores   Pre Score 22           MEDIFICTS Score Key:          ?70 Need to make dietary changes          40-70 Heart Healthy Diet         ? 40 Therapeutic Level Cholesterol Diet  Nutrition Goals Re-Evaluation:   Nutrition Goals Discharge (Final Nutrition Goals Re-Evaluation):   Psychosocial: Target Goals: Acknowledge presence or absence of significant depression and/or stress, maximize coping skills, provide positive support system. Participant is able to verbalize types and ability to use techniques and skills needed for reducing stress and depression.   Education: Depression - Provides group verbal and written instruction on the correlation between heart/lung disease and depressed mood, treatment options, and the stigmas associated with seeking treatment.   Education: Sleep Hygiene -Provides group verbal and written instruction about how sleep can affect your health.  Define sleep hygiene, discuss sleep cycles and impact of sleep habits. Review good sleep hygiene tips.    Education: Stress and Anxiety: - Provides group verbal and written instruction about the health risks of elevated stress and causes of high stress.  Discuss the correlation between heart/lung disease and anxiety and treatment options. Review healthy ways to manage with stress and anxiety.   Initial Review & Psychosocial Screening:  Initial Psych Review & Screening - 10/28/19 1417      Initial Review   Current issues with Current Stress Concerns    Source of Stress Concerns Chronic Illness;Unable to perform yard/household activities;Unable to participate in former interests or hobbies      [place]? Yes   husband; children and grandchildren     Barriers   Psychosocial barriers to participate in program There are no identifiable barriers or psychosocial needs.;The patient should benefit from training in stress management and  relaxation.      Screening Interventions   Interventions Encouraged to exercise;To provide support and resources with identified psychosocial needs;Provide feedback about the scores to participant    Expected Outcomes Short Term goal: Utilizing psychosocial counselor, staff and physician to assist with identification of specific Stressors or current issues interfering with healing process. Setting desired goal for each stressor or current issue identified.;Long Term Goal: Stressors or current issues are controlled or eliminated.;Short Term goal: Identification and review with participant of any Quality of Life or Depression concerns found by scoring the questionnaire.;Long Term goal: The participant improves quality of Life and PHQ9 Scores as seen by post scores and/or verbalization of changes           Quality of Life Scores:  Scores of 19 and below usually indicate a poorer quality of life in these areas.  A difference of  2-3 points is a clinically meaningful difference.  A difference of 2-3 points in the total score of the Quality of Life Index has been associated with significant improvement in overall quality of life, self-image, physical symptoms, and general health in studies assessing change in  quality of life.  PHQ-9: Recent Review Flowsheet Data    Depression screen [facility] 2/9 11/01/2019 10/28/2016   Decreased Interest - 0   Down, Depressed, Hopeless 0 0   PHQ - 2 Score 0 0   Altered sleeping 0 -   Tired, decreased energy 0 -   Change in appetite 0 -   Feeling bad or failure about yourself  0 -   Trouble concentrating 0 -   Moving slowly or fidgety/restless 2  -   Suicidal thoughts 0 -   PHQ-9 Score 2 -   Difficult doing work/chores Somewhat difficult -     Interpretation of Total Score  Total Score Depression Severity:  1-4 = Minimal depression, 5-9 = Mild depression, 10-14 = Moderate depression, 15-19 = Moderately severe depression, 20-27 = Severe depression   Psychosocial  Evaluation and Intervention:  Psychosocial Evaluation - 10/28/19 1426      Psychosocial Evaluation & Interventions   Interventions Encouraged to exercise with the program and follow exercise prescription    Comments Kolina has had a decline in her health more so this last year and was surprised with the breathing issues. She states her change in gait has led to her using a walker, she only wears oxygen at night because her sats have been good during the day, and her family thinks this is the way things will be from here on out. She wants to get stronger and feel more steady on her feet. She feels like coming here will help her understand what is going on and boost her stamina    Expected Outcomes Short: attend pulmonary rehab for exercise and education. Long: develop and maintain positive self care habits.    Continue Psychosocial Services  Follow up required by staff           Psychosocial Re-Evaluation:   Psychosocial Discharge (Final Psychosocial Re-Evaluation):   Education: Education Goals: Education classes will be provided on a weekly basis, covering required topics. Participant will state understanding/return demonstration of topics presented.  Learning Barriers/Preferences:  Learning Barriers/Preferences - 10/28/19 1417      Learning Barriers/Preferences   Learning Barriers None    Learning Preferences None           General Pulmonary Education Topics:  Infection Prevention: - Provides verbal and written material to individual with discussion of infection control including proper hand washing and proper equipment cleaning during exercise session.   Pulmonary Rehab from 11/01/2019 in [facility] Cardiac and Pulmonary Rehab  Date 11/01/19  Educator AS  Instruction Review Code 1- Verbalizes Understanding      Falls Prevention: - Provides verbal and written material to individual with discussion of falls prevention and safety.   Pulmonary Rehab from 11/01/2019 in [facility] Cardiac  and Pulmonary Rehab  Date 11/01/19  Educator AS  Instruction Review Code 1- Verbalizes Understanding      Chronic Lung Diseases: - Group verbal and written instruction to review updates, respiratory medications, advancements in procedures and treatments. Discuss use of supplemental oxygen including available portable oxygen systems, continuous and intermittent flow rates, concentrators, personal use and safety guidelines. Review proper use of inhaler and spacers. Provide informative websites for self-education.    Energy Conservation: - Provide group verbal and written instruction for methods to conserve energy, plan and organize activities. Instruct on pacing techniques, use of adaptive equipment and posture/positioning to relieve shortness of breath.   Triggers and Exacerbations: - Group verbal and written instruction to review types of environmental triggers and  ways to prevent exacerbations. Discuss weather changes, air quality and the benefits of nasal washing. Review warning signs and symptoms to help prevent infections. Discuss techniques for effective airway clearance, coughing, and vibrations.   AED/CPR: - Group verbal and written instruction with the use of models to demonstrate the basic use of the AED with the basic ABC's of resuscitation.   Anatomy and Physiology of the Lungs: - Group verbal and written instruction with the use of models to provide basic lung anatomy and physiology related to function, structure and complications of lung disease.   Anatomy & Physiology of the Heart: - Group verbal and written instruction and models provide basic cardiac anatomy and physiology, with the coronary electrical and arterial systems. Review of Valvular disease and Heart Failure   Cardiac Medications: - Group verbal and written instruction to review commonly prescribed medications for heart disease. Reviews the medication, class of the drug, and side  effects.   Other: -Provides group and verbal instruction on various topics (see comments)   Knowledge Questionnaire Score:  Knowledge Questionnaire Score - 11/01/19 1433      Knowledge Questionnaire Score   Pre Score 14/18            Core Components/Risk Factors/Patient Goals at Admission:  Personal Goals and Risk Factors at Admission - 11/01/19 1444      Core Components/Risk Factors/Patient Goals on Admission    Weight Management Yes    Intervention Weight Management: Develop a combined nutrition and exercise program designed to reach desired caloric intake, while maintaining appropriate intake of nutrient and fiber, sodium and fats, and appropriate energy expenditure required for the weight goal.;Weight Management: Provide education and appropriate resources to help participant work on and attain dietary goals.    Admit Weight 158 lb 4.8 oz (71.8 kg)    Goal Weight: Short Term 155 lb (70.3 kg)    Goal Weight: Long Term 155 lb (70.3 kg)    Expected Outcomes Weight Maintenance: Understanding of the daily nutrition guidelines, which includes 25-35% calories from fat, 7% or less cal from saturated fats, less than 250m cholesterol, less than 1.5gm of sodium, & 5 or more servings of fruits and vegetables daily    Hypertension Yes    Intervention Provide education on lifestyle modifcations including regular physical activity/exercise, weight management, moderate sodium restriction and increased consumption of fresh fruit, vegetables, and low fat dairy, alcohol moderation, and smoking cessation.;Monitor prescription use compliance.    Expected Outcomes Long Term: Maintenance of blood pressure at goal levels.;Short Term: Continued assessment and intervention until BP is < 140/951mHG in hypertensive participants. < 130/8059mG in hypertensive participants with diabetes, heart failure or chronic kidney disease.           Education:Diabetes - Individual verbal and written instruction to  review signs/symptoms of diabetes, desired ranges of glucose level fasting, after meals and with exercise. Acknowledge that pre and post exercise glucose checks will be done for 3 sessions at entry of program.   Education: Know Your Numbers and Risk Factors: -Group verbal and written instruction about important numbers in your health.  Discussion of what are risk factors and how they play a role in the disease process.  Review of Cholesterol, Blood Pressure, Diabetes, and BMI and the role they play in your overall health.   Core Components/Risk Factors/Patient Goals Review:    Core Components/Risk Factors/Patient Goals at Discharge (Final Review):    ITP Comments:  ITP Comments    Row Name 10/28/19 1425  ITP Comments Initial telephone encounter completed. Diagnosis can be found in HCL 7/15. EP orientation scheduled for 8/31 at 1pm.              Comments: initial ITP

## 2019-11-02 ENCOUNTER — Encounter: Payer: PPO | Attending: Pulmonary Disease | Admitting: *Deleted

## 2019-11-02 ENCOUNTER — Other Ambulatory Visit: Payer: Self-pay

## 2019-11-02 DIAGNOSIS — J449 Chronic obstructive pulmonary disease, unspecified: Secondary | ICD-10-CM | POA: Diagnosis not present

## 2019-11-02 NOTE — Progress Notes (Signed)
Daily Session Note  Patient Details  Name: Abigail Hill MRN: [id number] Date of Birth: [birth year] Referring Provider:     Pulmonary Rehab from 11/01/2019 in [facility] Cardiac and Pulmonary Rehab  Referring Provider [name]      Encounter Date: 11/02/2019  Check In:  Session Check In - 11/02/19 1526      Check-In   Supervising physician immediately available to respond to emergencies See telemetry face sheet for immediately available ER MD    Location [facility]-Cardiac & Pulmonary Rehab    Staff Present Renita Papa, RN BSN;Joseph Lou Miner, [place] Exercise Physiologist;Amanda Oletta Darter, [place], ACSM CEP, Exercise Physiologist    Virtual Visit No    Medication changes reported     No    Fall or balance concerns reported    No    Warm-up and Cool-down Performed on first and last piece of equipment    Resistance Training Performed Yes    VAD Patient? No    PAD/SET Patient? No      Pain Assessment   Currently in Pain? No/denies              Social History   Tobacco Use  Smoking Status Former Smoker  Smokeless Tobacco Never Used  Tobacco Comment   quit 10 years    Goals Met:  Independence with exercise equipment Exercise tolerated well No report of cardiac concerns or symptoms Strength training completed today  Goals Unmet:  Not Applicable  Comments: First full day of exercise!  Patient was oriented to gym and equipment including functions, settings, policies, and procedures.  Patient's individual exercise prescription and treatment plan were reviewed.  All starting workloads were established based on the results of the 6 minute walk test done at initial orientation visit.  The plan for exercise progression was also introduced and progression will be customized based on patient's performance and goals.    Dr. [name] is Medical Director for [place] and [facility].

## 2019-11-03 ENCOUNTER — Encounter: Payer: PPO | Admitting: *Deleted

## 2019-11-03 ENCOUNTER — Other Ambulatory Visit: Payer: Self-pay

## 2019-11-03 DIAGNOSIS — J449 Chronic obstructive pulmonary disease, unspecified: Secondary | ICD-10-CM | POA: Diagnosis not present

## 2019-11-03 NOTE — Progress Notes (Signed)
Daily Session Note  Patient Details  Name: Abigail Hill MRN: [id number] Date of Birth: 1950/07/20 Referring Provider:     Pulmonary Rehab from 11/01/2019 in [facility] Cardiac and Pulmonary Rehab  Referring Provider [name]      Encounter Date: 11/03/2019  Check In:  Session Check In - 11/03/19 1539      Check-In   Supervising physician immediately available to respond to emergencies See telemetry face sheet for immediately available ER MD    Location [facility]-Cardiac & Pulmonary Rehab    Staff Present Renita Papa, RN BSN;Jessica Luan Pulling, MA, RCEP, CCRP, CCET    Virtual Visit No    Medication changes reported     No    Fall or balance concerns reported    No    Warm-up and Cool-down Performed on first and last piece of equipment    Resistance Training Performed Yes    VAD Patient? No    PAD/SET Patient? No      Pain Assessment   Currently in Pain? No/denies              Social History   Tobacco Use  Smoking Status Former Smoker  Smokeless Tobacco Never Used  Tobacco Comment   quit 10 years    Goals Met:  Independence with exercise equipment Exercise tolerated well No report of cardiac concerns or symptoms Strength training completed today  Goals Unmet:  Not Applicable  Comments: Pt able to follow exercise prescription today without complaint.  Will continue to monitor for progression.    Dr. [name] is Medical Director for [place] and [facility].

## 2019-11-09 ENCOUNTER — Encounter: Payer: PPO | Admitting: *Deleted

## 2019-11-09 ENCOUNTER — Other Ambulatory Visit: Payer: Self-pay

## 2019-11-09 ENCOUNTER — Encounter: Payer: Self-pay | Admitting: *Deleted

## 2019-11-09 DIAGNOSIS — J449 Chronic obstructive pulmonary disease, unspecified: Secondary | ICD-10-CM | POA: Diagnosis not present

## 2019-11-09 NOTE — Progress Notes (Signed)
Cardiac Individual Treatment Plan  Patient Details  Name: Abigail Hill MRN: [id number] Date of Birth: 1950-08-27 Referring Provider:     Pulmonary Rehab from 11/01/2019 in [facility] Cardiac and Pulmonary Rehab  Referring Provider Aleskerov      Initial Encounter Date:    Pulmonary Rehab from 11/01/2019 in [facility] Cardiac and Pulmonary Rehab  Date 11/01/19      Visit Diagnosis: Chronic obstructive pulmonary disease, unspecified COPD type ([place])  Patient's Home Medications on Admission:  Current Outpatient Medications:  .  albuterol (PROVENTIL) (2.5 MG/3ML) 0.083% nebulizer solution, Take 3 mLs (2.5 mg total) by nebulization every 6 (six) hours as needed for wheezing or shortness of breath., Disp: 360 mL, Rfl: 0 .  albuterol (VENTOLIN HFA) 108 (90 Base) MCG/ACT inhaler, Inhale 2 puffs into the lungs every 6 (six) hours as needed for wheezing or shortness of breath. (Patient not taking: Reported on 09/07/2019), Disp: 8 g, Rfl: 0 .  alendronate (FOSAMAX) 70 MG tablet, Take 70 mg by mouth every Monday.  (Patient not taking: Reported on 09/07/2019), Disp: , Rfl:  .  aspirin 81 MG chewable tablet, Chew 81 mg by mouth daily., Disp: , Rfl:  .  Calcium Carb-Cholecalciferol (OYSTER SHELL CALCIUM) 500-400 MG-UNIT TABS, Take 1 tablet by mouth daily.  (Patient not taking: Reported on 10/28/2019), Disp: , Rfl:  .  furosemide (LASIX) 20 MG tablet, Take 1 tablet (20 mg total) by mouth 2 (two) times daily. (Patient not taking: Reported on 10/28/2019), Disp: 60 tablet, Rfl: 0 .  ketoconazole (NIZORAL) 2 % shampoo, Apply topically 3 (three) times a week. (Patient not taking: Reported on 10/28/2019), Disp: , Rfl:  .  mirabegron ER (MYRBETRIQ) 50 MG TB24 tablet, Take 1 tablet (50 mg total) by mouth daily., Disp: 30 tablet, Rfl: 11 .  Multiple Vitamins-Minerals (CENTRUM SILVER PO), Take 1 tablet by mouth daily.  (Patient not taking: Reported on 10/28/2019), Disp: , Rfl:  .  nystatin-triamcinolone ointment (MYCOLOG), Apply 1  application topically 2 (two) times daily. (Patient not taking: Reported on 10/28/2019), Disp: 30 g, Rfl: 0 .  torsemide (DEMADEX) 20 MG tablet, Take 20 mg by mouth daily. (Patient not taking: Reported on 10/28/2019), Disp: , Rfl:  .  umeclidinium-vilanterol (ANORO ELLIPTA) 62.5-25 MCG/INH AEPB, Inhale 1 puff into the lungs daily. (Patient not taking: Reported on 09/07/2019), Disp: 60 each, Rfl: 0  Past Medical History: Past Medical History:  Diagnosis Date  . Abnormal gait 12/18/2015  . BP (high blood pressure) 08/24/2015  . Cancer ([place])    skin  . Constriction of ureter (postoperative) 07/28/2015  . Hydronephrosis 07/14/2015  . Hypertension   . Left foot drop 12/18/2015  . Recurrent UTI   . UPJ (ureteropelvic junction) obstruction 07/28/2015  . UTI (lower urinary tract infection) 07/14/2015    Tobacco Use: Social History   Tobacco Use  Smoking Status Former Smoker  Smokeless Tobacco Never Used  Tobacco Comment   quit 10 years    Labs: Recent Review Scientist, physiological    Labs for ITP Cardiac and Pulmonary Rehab Latest Ref Rng & Units 05/16/2019   PHART 7.35 - 7.45 7.38   PCO2ART 32 - 48 mmHg 88(HH)   HCO3 20.0 - 28.0 mmol/L 52.1(H)   O2SAT % 92.6       Exercise Target Goals: Exercise Program Goal: Individual exercise prescription set using results from initial 6 min walk test and THRR while considering  patient's activity barriers and safety.   Exercise Prescription Goal: Initial exercise prescription builds  to 30-45 minutes a day of aerobic activity, 2-3 days per week.  Home exercise guidelines will be given to patient during program as part of exercise prescription that the participant will acknowledge.   Education: Aerobic Exercise & Resistance Training: - Gives group verbal and written instruction on the various components of exercise. Focuses on aerobic and resistive training programs and the benefits of this training and how to safely progress through these  programs..   Education: Exercise & Equipment Safety: - Individual verbal instruction and demonstration of equipment use and safety with use of the equipment.   Pulmonary Rehab from 11/02/2019 in [facility] Cardiac and Pulmonary Rehab  Date 11/01/19  Educator AS  Instruction Review Code 1- Verbalizes Understanding      Education: Exercise Physiology & General Exercise Guidelines: - Group verbal and written instruction with models to review the exercise physiology of the cardiovascular system and associated critical values. Provides general exercise guidelines with specific guidelines to those with heart or lung disease.    Education: Flexibility, Balance, Mind/Body Relaxation: Provides group verbal/written instruction on the benefits of flexibility and balance training, including mind/body exercise modes such as yoga, pilates and tai chi.  Demonstration and skill practice provided.   Pulmonary Rehab from 11/02/2019 in [facility] Cardiac and Pulmonary Rehab  Date 11/02/19  Educator AS  Instruction Review Code 1- Verbalizes Understanding      Activity Barriers & Risk Stratification:  Activity Barriers & Cardiac Risk Stratification - 10/28/19 1415      Activity Barriers & Cardiac Risk Stratification   Activity Barriers Balance Concerns;Assistive Device;Other (comment);Muscular Weakness    Comments broken collar bone easter 2020- no restrictions           6 Minute Walk:  6 Minute Walk    Row Name 11/01/19 1434 11/02/19 1623       6 Minute Walk   Phase Initial --    Distance 195 feet --    Walk Time 6 minutes --    # of Rest Breaks 0 --    MPH 0.37 --    METS 0.9 --    RPE -- 13    Perceived Dyspnea  -- 1    VO2 Peak 3.11 --    Symptoms No --    Resting HR 87 bpm --    Resting BP 122/66 --    Resting Oxygen Saturation  94 % --    Exercise Oxygen Saturation  during 6 min walk 85 % --    Max Ex. HR 112 bpm --    Max Ex. BP 140/62 --    2 Minute Post BP 130/64 --      Interval HR    1 Minute HR 101 --    2 Minute HR 100 --    3 Minute HR 108 --    4 Minute HR 110 --    5 Minute HR 110 --    6 Minute HR 112 --    2 Minute Post HR 96 --    Interval Heart Rate? Yes --      Interval Oxygen   Interval Oxygen? Yes --    Baseline Oxygen Saturation % 94 % --    1 Minute Oxygen Saturation % 90 % --    1 Minute Liters of Oxygen 0 L --    2 Minute Oxygen Saturation % 88 % --    2 Minute Liters of Oxygen 0 L --    3 Minute Oxygen Saturation % 87 % --  3 Minute Liters of Oxygen 0 L --    4 Minute Oxygen Saturation % 85 % --    4 Minute Liters of Oxygen 0 L --    5 Minute Oxygen Saturation % 87 % --    5 Minute Liters of Oxygen 0 L --    6 Minute Oxygen Saturation % 86 % --    6 Minute Liters of Oxygen 0 L --    2 Minute Post Oxygen Saturation % 90 % --    2 Minute Post Liters of Oxygen 0 L --           Oxygen Initial Assessment:  Oxygen Initial Assessment - 10/28/19 1410      Home Oxygen   Home Oxygen Device Portable Concentrator    Sleep Oxygen Prescription Continuous    Liters per minute 2    Home Exercise Oxygen Prescription Continuous   O2 is usually above 94% during the day without any oxygen, wears pulse ox and monitors during the day   Liters per minute 2    Home at Rest Exercise Oxygen Prescription Continuous    Liters per minute 2    Compliance with Home Oxygen Use Yes   only wears if O2 sat drops     Intervention   Short Term Goals To learn and exhibit compliance with exercise, home and travel O2 prescription;To learn and understand importance of monitoring SPO2 with pulse oximeter and demonstrate accurate use of the pulse oximeter.;To learn and understand importance of maintaining oxygen saturations>88%;To learn and demonstrate proper pursed lip breathing techniques or other breathing techniques.    Long  Term Goals Exhibits compliance with exercise, home and travel O2 prescription;Maintenance of O2 saturations>88%;Verbalizes importance of  monitoring SPO2 with pulse oximeter and return demonstration;Exhibits proper breathing techniques, such as pursed lip breathing or other method taught during program session           Oxygen Re-Evaluation:   Oxygen Discharge (Final Oxygen Re-Evaluation):   Initial Exercise Prescription:  Initial Exercise Prescription - 11/01/19 1400      Date of Initial Exercise RX and Referring Provider   Date 11/01/19    Referring Provider Aleskerov      Treadmill   MPH 0.4    Grade 0    Minutes 15    METs 1      Recumbant Bike   Level 1    RPM 60    Minutes 15    METs 1      NuStep   Level 1    SPM 80    Minutes 15    METs 1      Prescription Details   Frequency (times per week) 3    Duration Progress to 30 minutes of continuous aerobic without signs/symptoms of physical distress      Intensity   THRR 40-80% of Max Heartrate 111-137    Ratings of Perceived Exertion 11-15    Perceived Dyspnea 0-4      Resistance Training   Training Prescription Yes    Weight 2 lb    Reps 10-15           Perform Capillary Blood Glucose checks as needed.  Exercise Prescription Changes:  Exercise Prescription Changes    Row Name 11/01/19 1400 11/02/19 1600           Response to Exercise   Blood Pressure (Admit) 122/66 --      Blood Pressure (Exercise) 140/62 --  Blood Pressure (Exit) 130/64 --      Heart Rate (Admit) 87 bpm --      Heart Rate (Exercise) 112 bpm --      Heart Rate (Exit) 96 bpm --      Oxygen Saturation (Admit) 94 % --      Oxygen Saturation (Exercise) 85 % --      Oxygen Saturation (Exit) 90 % --      Rating of Perceived Exertion (Exercise) -- 13      Perceived Dyspnea (Exercise) -- 1      Symptoms none --      Comments walk test --             Exercise Comments:   Exercise Goals and Review:  Exercise Goals    Row Name 11/01/19 1440             Exercise Goals   Increase Physical Activity Yes       Intervention Provide advice,  education, support and counseling about physical activity/exercise needs.;Develop an individualized exercise prescription for aerobic and resistive training based on initial evaluation findings, risk stratification, comorbidities and participant's personal goals.       Expected Outcomes Short Term: Attend rehab on a regular basis to increase amount of physical activity.;Long Term: Add in home exercise to make exercise part of routine and to increase amount of physical activity.       Increase Strength and Stamina Yes       Intervention Provide advice, education, support and counseling about physical activity/exercise needs.;Develop an individualized exercise prescription for aerobic and resistive training based on initial evaluation findings, risk stratification, comorbidities and participant's personal goals.       Expected Outcomes Short Term: Increase workloads from initial exercise prescription for resistance, speed, and METs.;Short Term: Perform resistance training exercises routinely during rehab and add in resistance training at home;Long Term: Improve cardiorespiratory fitness, muscular endurance and strength as measured by increased METs and functional capacity (6MWT)       Able to understand and use rate of perceived exertion (RPE) scale Yes       Intervention Provide education and explanation on how to use RPE scale       Expected Outcomes Short Term: Able to use RPE daily in rehab to express subjective intensity level;Long Term:  Able to use RPE to guide intensity level when exercising independently       Able to understand and use Dyspnea scale Yes       Intervention Provide education and explanation on how to use Dyspnea scale       Expected Outcomes Short Term: Able to use Dyspnea scale daily in rehab to express subjective sense of shortness of breath during exertion;Long Term: Able to use Dyspnea scale to guide intensity level when exercising independently       Knowledge and understanding  of Target Heart Rate Range (THRR) Yes       Intervention Provide education and explanation of THRR including how the numbers were predicted and where they are located for reference       Expected Outcomes Short Term: Able to state/look up THRR;Short Term: Able to use daily as guideline for intensity in rehab;Long Term: Able to use THRR to govern intensity when exercising independently       Able to check pulse independently Yes       Intervention Provide education and demonstration on how to check pulse in carotid and radial arteries.;Review the importance of being  able to check your own pulse for safety during independent exercise       Expected Outcomes Short Term: Able to explain why pulse checking is important during independent exercise;Long Term: Able to check pulse independently and accurately       Understanding of Exercise Prescription Yes       Intervention Provide education, explanation, and written materials on patient's individual exercise prescription       Expected Outcomes Short Term: Able to explain program exercise prescription;Long Term: Able to explain home exercise prescription to exercise independently              Exercise Goals Re-Evaluation :  Exercise Goals Re-Evaluation    Row Name 11/02/19 1529             Exercise Goal Re-Evaluation   Exercise Goals Review Increase Physical Activity;Able to understand and use rate of perceived exertion (RPE) scale;Knowledge and understanding of Target Heart Rate Range (THRR);Understanding of Exercise Prescription;Increase Strength and Stamina;Able to check pulse independently       Comments Reviewed RPE and dyspnea scales, THR and program prescription with pt today.  Pt voiced understanding and was given a copy of goals to take home.       Expected Outcomes Short: Use RPE daily to regulate intensity. Long: Follow program prescription in THR.              Discharge Exercise Prescription (Final Exercise Prescription  Changes):  Exercise Prescription Changes - 11/02/19 1600      Response to Exercise   Rating of Perceived Exertion (Exercise) 13    Perceived Dyspnea (Exercise) 1           Nutrition:  Target Goals: Understanding of nutrition guidelines, daily intake of sodium <156m, cholesterol <2053m calories 30% from fat and 7% or less from saturated fats, daily to have 5 or more servings of fruits and vegetables.  Education: Controlling Sodium/Reading Food Labels -Group verbal and written material supporting the discussion of sodium use in heart healthy nutrition. Review and explanation with models, verbal and written materials for utilization of the food label.   Education: General Nutrition Guidelines/Fats and Fiber: -Group instruction provided by verbal, written material, models and posters to present the general guidelines for heart healthy nutrition. Gives an explanation and review of dietary fats and fiber.   Biometrics:  Pre Biometrics - 11/01/19 1442      Pre Biometrics   Height 5' 1.25" (1.556 m)    Weight 158 lb 4.8 oz (71.8 kg)    BMI (Calculated) 29.66            Nutrition Therapy Plan and Nutrition Goals:   Nutrition Assessments:  Nutrition Assessments - 11/01/19 1433      MEDFICTS Scores   Pre Score 22           MEDIFICTS Score Key:          ?70 Need to make dietary changes          40-70 Heart Healthy Diet         ? 40 Therapeutic Level Cholesterol Diet  Nutrition Goals Re-Evaluation:   Nutrition Goals Discharge (Final Nutrition Goals Re-Evaluation):   Psychosocial: Target Goals: Acknowledge presence or absence of significant depression and/or stress, maximize coping skills, provide positive support system. Participant is able to verbalize types and ability to use techniques and skills needed for reducing stress and depression.   Education: Depression - Provides group verbal and written instruction on the correlation between  heart/lung disease and  depressed mood, treatment options, and the stigmas associated with seeking treatment.   Education: Sleep Hygiene -Provides group verbal and written instruction about how sleep can affect your health.  Define sleep hygiene, discuss sleep cycles and impact of sleep habits. Review good sleep hygiene tips.     Education: Stress and Anxiety: - Provides group verbal and written instruction about the health risks of elevated stress and causes of high stress.  Discuss the correlation between heart/lung disease and anxiety and treatment options. Review healthy ways to manage with stress and anxiety.    Initial Review & Psychosocial Screening:  Initial Psych Review & Screening - 10/28/19 1417      Initial Review   Current issues with Current Stress Concerns    Source of Stress Concerns Chronic Illness;Unable to perform yard/household activities;Unable to participate in former interests or hobbies      [place]? Yes   husband; children and grandchildren     Barriers   Psychosocial barriers to participate in program There are no identifiable barriers or psychosocial needs.;The patient should benefit from training in stress management and relaxation.      Screening Interventions   Interventions Encouraged to exercise;To provide support and resources with identified psychosocial needs;Provide feedback about the scores to participant    Expected Outcomes Short Term goal: Utilizing psychosocial counselor, staff and physician to assist with identification of specific Stressors or current issues interfering with healing process. Setting desired goal for each stressor or current issue identified.;Long Term Goal: Stressors or current issues are controlled or eliminated.;Short Term goal: Identification and review with participant of any Quality of Life or Depression concerns found by scoring the questionnaire.;Long Term goal: The participant improves quality of Life and PHQ9  Scores as seen by post scores and/or verbalization of changes           Quality of Life Scores:   Scores of 19 and below usually indicate a poorer quality of life in these areas.  A difference of  2-3 points is a clinically meaningful difference.  A difference of 2-3 points in the total score of the Quality of Life Index has been associated with significant improvement in overall quality of life, self-image, physical symptoms, and general health in studies assessing change in quality of life.  PHQ-9: Recent Review Flowsheet Data    Depression screen [facility] 2/9 11/01/2019 10/28/2016   Decreased Interest - 0   Down, Depressed, Hopeless 0 0   PHQ - 2 Score 0 0   Altered sleeping 0 -   Tired, decreased energy 0 -   Change in appetite 0 -   Feeling bad or failure about yourself  0 -   Trouble concentrating 0 -   Moving slowly or fidgety/restless 2  -   Suicidal thoughts 0 -   PHQ-9 Score 2 -   Difficult doing work/chores Somewhat difficult -     Interpretation of Total Score  Total Score Depression Severity:  1-4 = Minimal depression, 5-9 = Mild depression, 10-14 = Moderate depression, 15-19 = Moderately severe depression, 20-27 = Severe depression   Psychosocial Evaluation and Intervention:  Psychosocial Evaluation - 10/28/19 1426      Psychosocial Evaluation & Interventions   Interventions Encouraged to exercise with the program and follow exercise prescription    Comments Samora has had a decline in her health more so this last year and was surprised with the breathing issues. She states her change in  gait has led to her using a walker, she only wears oxygen at night because her sats have been good during the day, and her family thinks this is the way things will be from here on out. She wants to get stronger and feel more steady on her feet. She feels like coming here will help her understand what is going on and boost her stamina    Expected Outcomes Short: attend pulmonary rehab for  exercise and education. Long: develop and maintain positive self care habits.    Continue Psychosocial Services  Follow up required by staff           Psychosocial Re-Evaluation:   Psychosocial Discharge (Final Psychosocial Re-Evaluation):   Vocational Rehabilitation: Provide vocational rehab assistance to qualifying candidates.   Vocational Rehab Evaluation & Intervention:  Vocational Rehab - 10/28/19 1417      Initial Vocational Rehab Evaluation & Intervention   Assessment shows need for Vocational Rehabilitation No           Education: Education Goals: Education classes will be provided on a variety of topics geared toward better understanding of heart health and risk factor modification. Participant will state understanding/return demonstration of topics presented as noted by education test scores.  Learning Barriers/Preferences:  Learning Barriers/Preferences - 10/28/19 1417      Learning Barriers/Preferences   Learning Barriers None    Learning Preferences None           General Cardiac Education Topics:  AED/CPR: - Group verbal and written instruction with the use of models to demonstrate the basic use of the AED with the basic ABC's of resuscitation.   Anatomy & Physiology of the Heart: - Group verbal and written instruction and models provide basic cardiac anatomy and physiology, with the coronary electrical and arterial systems. Review of Valvular disease and Heart Failure   Cardiac Procedures: - Group verbal and written instruction to review commonly prescribed medications for heart disease. Reviews the medication, class of the drug, and side effects. Includes the steps to properly store meds and maintain the prescription regimen. (beta blockers and nitrates)   Cardiac Medications I: - Group verbal and written instruction to review commonly prescribed medications for heart disease. Reviews the medication, class of the drug, and side effects. Includes  the steps to properly store meds and maintain the prescription regimen.   Cardiac Medications II: -Group verbal and written instruction to review commonly prescribed medications for heart disease. Reviews the medication, class of the drug, and side effects. (all other drug classes)    Go Sex-Intimacy & Heart Disease, Get SMART - Goal Setting: - Group verbal and written instruction through game format to discuss heart disease and the return to sexual intimacy. Provides group verbal and written material to discuss and apply goal setting through the application of the S.M.A.R.T. Method.   Other Matters of the Heart: - Provides group verbal, written materials and models to describe Stable Angina and Peripheral Artery. Includes description of the disease process and treatment options available to the cardiac patient.   Infection Prevention: - Provides verbal and written material to individual with discussion of infection control including proper hand washing and proper equipment cleaning during exercise session.   Pulmonary Rehab from 11/02/2019 in [facility] Cardiac and Pulmonary Rehab  Date 11/01/19  Educator AS  Instruction Review Code 1- Verbalizes Understanding      Falls Prevention: - Provides verbal and written material to individual with discussion of falls prevention and safety.   Pulmonary Rehab  from 11/02/2019 in [facility] Cardiac and Pulmonary Rehab  Date 11/01/19  Educator AS  Instruction Review Code 1- Verbalizes Understanding      Other: -Provides group and verbal instruction on various topics (see comments)   Knowledge Questionnaire Score:  Knowledge Questionnaire Score - 11/01/19 1433      Knowledge Questionnaire Score   Pre Score 14/18           Core Components/Risk Factors/Patient Goals at Admission:  Personal Goals and Risk Factors at Admission - 11/01/19 1444      Core Components/Risk Factors/Patient Goals on Admission    Weight Management Yes    Intervention  Weight Management: Develop a combined nutrition and exercise program designed to reach desired caloric intake, while maintaining appropriate intake of nutrient and fiber, sodium and fats, and appropriate energy expenditure required for the weight goal.;Weight Management: Provide education and appropriate resources to help participant work on and attain dietary goals.    Admit Weight 158 lb 4.8 oz (71.8 kg)    Goal Weight: Short Term 155 lb (70.3 kg)    Goal Weight: Long Term 155 lb (70.3 kg)    Expected Outcomes Weight Maintenance: Understanding of the daily nutrition guidelines, which includes 25-35% calories from fat, 7% or less cal from saturated fats, less than 264m cholesterol, less than 1.5gm of sodium, & 5 or more servings of fruits and vegetables daily    Hypertension Yes    Intervention Provide education on lifestyle modifcations including regular physical activity/exercise, weight management, moderate sodium restriction and increased consumption of fresh fruit, vegetables, and low fat dairy, alcohol moderation, and smoking cessation.;Monitor prescription use compliance.    Expected Outcomes Long Term: Maintenance of blood pressure at goal levels.;Short Term: Continued assessment and intervention until BP is < 140/979mHG in hypertensive participants. < 130/8021mG in hypertensive participants with diabetes, heart failure or chronic kidney disease.           Education:Diabetes - Individual verbal and written instruction to review signs/symptoms of diabetes, desired ranges of glucose level fasting, after meals and with exercise. Acknowledge that pre and post exercise glucose checks will be done for 3 sessions at entry of program.   Education: Know Your Numbers and Risk Factors: -Group verbal and written instruction about important numbers in your health.  Discussion of what are risk factors and how they play a role in the disease process.  Review of Cholesterol, Blood Pressure, Diabetes,  and BMI and the role they play in your overall health.   Core Components/Risk Factors/Patient Goals Review:    Core Components/Risk Factors/Patient Goals at Discharge (Final Review):    ITP Comments:  ITP Comments    Row Name 10/28/19 1425 11/01/19 1446 11/02/19 1528 11/09/19 1506     ITP Comments Initial telephone encounter completed. Diagnosis can be found in HCL 7/15. EP orientation scheduled for 8/31 at 1pm. Completed 6MWT and gym orientation. Initial ITP created and sent for review to Dr. MarEmily Filbertedical Director. First full day of exercise!  Patient was oriented to gym and equipment including functions, settings, policies, and procedures.  Patient's individual exercise prescription and treatment plan were reviewed.  All starting workloads were established based on the results of the 6 minute walk test done at initial orientation visit.  The plan for exercise progression was also introduced and progression will be customized based on patient's performance and goals 30 day review completed. ITP sent to Dr. MarEmily Filbertedical Director of Cardiac and Pulmonary Rehab. Continue with  ITP unless changes are made by physician.           Comments: 30 day review

## 2019-11-09 NOTE — Progress Notes (Signed)
Daily Session Note  Patient Details  Name: Abigail Hill MRN: [id number] Date of Birth: 08/17/1950 Referring Provider:     Pulmonary Rehab from 11/01/2019 in [facility] Cardiac and Pulmonary Rehab  Referring Provider [name]      Encounter Date: 11/09/2019  Check In:  Session Check In - 11/09/19 1547      Check-In   Supervising physician immediately available to respond to emergencies See telemetry face sheet for immediately available ER MD    Location [facility]-Cardiac & Pulmonary Rehab    Staff Present Renita Papa, RN Margurite Auerbach, MS Exercise Physiologist;Amanda Oletta Darter, BA, ACSM CEP, Exercise Physiologist;Melissa Caiola RDN, LDN    Virtual Visit No    Medication changes reported     No    Fall or balance concerns reported    No    Warm-up and Cool-down Performed on first and last piece of equipment    Resistance Training Performed Yes    VAD Patient? No    PAD/SET Patient? No      Pain Assessment   Currently in Pain? No/denies              Social History   Tobacco Use  Smoking Status Former Smoker  Smokeless Tobacco Never Used  Tobacco Comment   quit 10 years    Goals Met:  Independence with exercise equipment Exercise tolerated well No report of cardiac concerns or symptoms Strength training completed today  Goals Unmet:  Not Applicable  Comments: Pt able to follow exercise prescription today without complaint.  Will continue to monitor for progression.    Dr. [name] is Medical Director for [place] and [facility].

## 2019-11-10 ENCOUNTER — Other Ambulatory Visit: Payer: Self-pay

## 2019-11-10 ENCOUNTER — Encounter: Payer: PPO | Admitting: *Deleted

## 2019-11-10 DIAGNOSIS — J449 Chronic obstructive pulmonary disease, unspecified: Secondary | ICD-10-CM

## 2019-11-10 NOTE — Progress Notes (Signed)
Daily Session Note  Patient Details  Name: SHIKHA BIBB MRN: [id number] Date of Birth: 1950-03-09 Referring Provider:     Pulmonary Rehab from 11/01/2019 in [facility] Cardiac and Pulmonary Rehab  Referring Provider [name]      Encounter Date: 11/10/2019  Check In:  Session Check In - 11/10/19 1532      Check-In   Supervising physician immediately available to respond to emergencies See telemetry face sheet for immediately available ER MD    Location [facility]-Cardiac & Pulmonary Rehab    Staff Present Renita Papa, RN BSN;Joseph [address] [place] [facility], [place], [place], CCRP, [place], [place], ACSM CEP, Exercise Physiologist    Virtual Visit No    Medication changes reported     No    Fall or balance concerns reported    No    Warm-up and Cool-down Performed on first and last piece of equipment    Resistance Training Performed Yes    VAD Patient? No    PAD/SET Patient? No      Pain Assessment   Currently in Pain? No/denies              Social History   Tobacco Use  Smoking Status Former Smoker  Smokeless Tobacco Never Used  Tobacco Comment   quit 10 years    Goals Met:  Independence with exercise equipment Exercise tolerated well No report of cardiac concerns or symptoms Strength training completed today  Goals Unmet:  Not Applicable  Comments: Pt able to follow exercise prescription today without complaint.  Will continue to monitor for progression.    Dr. [name] is Medical Director for [place] and [facility].

## 2019-11-10 NOTE — Progress Notes (Signed)
Completed Initial RD Evaluation 

## 2019-11-14 ENCOUNTER — Other Ambulatory Visit: Payer: Self-pay

## 2019-11-14 ENCOUNTER — Encounter: Payer: PPO | Admitting: *Deleted

## 2019-11-14 DIAGNOSIS — J449 Chronic obstructive pulmonary disease, unspecified: Secondary | ICD-10-CM | POA: Diagnosis not present

## 2019-11-14 NOTE — Progress Notes (Signed)
Daily Session Note  Patient Details  Name: LEYLANY NORED MRN: [id number] Date of Birth: 04-Sep-1950 Referring Provider:     Pulmonary Rehab from 11/01/2019 in [facility] Cardiac and Pulmonary Rehab  Referring Provider [name]      Encounter Date: 11/14/2019  Check In:  Session Check In - 11/14/19 1539      Check-In   Supervising physician immediately available to respond to emergencies See telemetry face sheet for immediately available ER MD    Location [facility]-Cardiac & Pulmonary Rehab    Staff Present Renita Papa, RN Margurite Auerbach, MS Exercise Physiologist;Kelly Amedeo Plenty, BS, ACSM CEP, Exercise Physiologist    Virtual Visit No    Medication changes reported     No    Fall or balance concerns reported    No    Warm-up and Cool-down Performed on first and last piece of equipment    Resistance Training Performed Yes    VAD Patient? No    PAD/SET Patient? No      Pain Assessment   Currently in Pain? No/denies              Social History   Tobacco Use  Smoking Status Former Smoker  Smokeless Tobacco Never Used  Tobacco Comment   quit 10 years    Goals Met:  Independence with exercise equipment Exercise tolerated well No report of cardiac concerns or symptoms Strength training completed today  Goals Unmet:  Not Applicable  Comments: Pt able to follow exercise prescription today without complaint.  Will continue to monitor for progression.    Dr. [name] is Medical Director for [place] and [facility].

## 2019-11-16 ENCOUNTER — Encounter: Payer: PPO | Admitting: *Deleted

## 2019-11-16 ENCOUNTER — Other Ambulatory Visit: Payer: Self-pay

## 2019-11-16 DIAGNOSIS — J449 Chronic obstructive pulmonary disease, unspecified: Secondary | ICD-10-CM

## 2019-11-16 NOTE — Progress Notes (Signed)
Daily Session Note  Patient Details  Name: Abigail Hill MRN: [id number] Date of Birth: 09/17/1950 Referring Provider:     Pulmonary Rehab from 11/01/2019 in [facility] Cardiac and Pulmonary Rehab  Referring Provider [name]      Encounter Date: 11/16/2019  Check In:  Session Check In - 11/16/19 1532      Check-In   Supervising physician immediately available to respond to emergencies See telemetry face sheet for immediately available ER MD    Location [facility]-Cardiac & Pulmonary Rehab    Staff Present Renita Papa, RN BSN;Joseph Lou [place], [place] Exercise Physiologist    Virtual Visit No    Medication changes reported     No    Fall or balance concerns reported    No    Warm-up and Cool-down Performed on first and last piece of equipment    Resistance Training Performed Yes    VAD Patient? No    PAD/SET Patient? No      Pain Assessment   Currently in Pain? No/denies              Social History   Tobacco Use  Smoking Status Former Smoker  Smokeless Tobacco Never Used  Tobacco Comment   quit 10 years    Goals Met:  Independence with exercise equipment Exercise tolerated well No report of cardiac concerns or symptoms Strength training completed today  Goals Unmet:  Not Applicable  Comments: Pt able to follow exercise prescription today without complaint.  Will continue to monitor for progression.    Dr. [name] is Medical Director for [place] and [facility].

## 2019-11-17 ENCOUNTER — Other Ambulatory Visit: Payer: Self-pay

## 2019-11-17 ENCOUNTER — Encounter: Payer: PPO | Admitting: *Deleted

## 2019-11-17 DIAGNOSIS — J449 Chronic obstructive pulmonary disease, unspecified: Secondary | ICD-10-CM | POA: Diagnosis not present

## 2019-11-17 NOTE — Progress Notes (Signed)
Daily Session Note  Patient Details  Name: SATIA WINGER MRN: [id number] Date of Birth: 05-Dec-1950 Referring Provider:     Pulmonary Rehab from 11/01/2019 in [facility] Cardiac and Pulmonary Rehab  Referring Provider [name]      Encounter Date: 11/17/2019  Check In:  Session Check In - 11/17/19 1546      Check-In   Supervising physician immediately available to respond to emergencies See telemetry face sheet for immediately available ER MD    Location [facility]-Cardiac & Pulmonary Rehab    Staff Present Renita Papa, RN BSN;Jessica Luan Pulling, MA, RCEP, CCRP, CCET;Joseph [place] RCP,RRT,BSRT    Virtual Visit No    Medication changes reported     No    Fall or balance concerns reported    No    Warm-up and Cool-down Performed on first and last piece of equipment    Resistance Training Performed Yes    VAD Patient? No    PAD/SET Patient? No      Pain Assessment   Currently in Pain? No/denies              Social History   Tobacco Use  Smoking Status Former Smoker  Smokeless Tobacco Never Used  Tobacco Comment   quit 10 years    Goals Met:  Independence with exercise equipment Exercise tolerated well No report of cardiac concerns or symptoms Strength training completed today  Goals Unmet:  Not Applicable  Comments: Pt able to follow exercise prescription today without complaint.  Will continue to monitor for progression.    Dr. [name] is Medical Director for [place] and [facility].

## 2019-11-21 ENCOUNTER — Encounter: Payer: PPO | Admitting: *Deleted

## 2019-11-21 ENCOUNTER — Other Ambulatory Visit: Payer: Self-pay

## 2019-11-21 DIAGNOSIS — J449 Chronic obstructive pulmonary disease, unspecified: Secondary | ICD-10-CM | POA: Diagnosis not present

## 2019-11-21 NOTE — Progress Notes (Signed)
Daily Session Note  Patient Details  Name: Abigail Hill MRN: [id number] Date of Birth: [birth year] Referring Provider:     Pulmonary Rehab from 11/01/2019 in [facility] Cardiac and Pulmonary Rehab  Referring Provider [name]      Encounter Date: 11/21/2019  Check In:  Session Check In - 11/21/19 1527      Check-In   Supervising physician immediately available to respond to emergencies See telemetry face sheet for immediately available ER MD    Location [facility]-Cardiac & Pulmonary Rehab    Staff Present Renita Papa, RN Moises Blood, BS, ACSM CEP, Exercise Physiologist;Kara Eliezer Bottom, MS Exercise Physiologist    Virtual Visit No    Medication changes reported     No    Fall or balance concerns reported    No    Warm-up and Cool-down Performed on first and last piece of equipment    Resistance Training Performed Yes    VAD Patient? No    PAD/SET Patient? No      Pain Assessment   Currently in Pain? No/denies              Social History   Tobacco Use  Smoking Status Former Smoker  Smokeless Tobacco Never Used  Tobacco Comment   quit 10 years    Goals Met:  Independence with exercise equipment Exercise tolerated well No report of cardiac concerns or symptoms Strength training completed today  Goals Unmet:  Not Applicable  Comments: Pt able to follow exercise prescription today without complaint.  Will continue to monitor for progression.    Dr. [name] is Medical Director for [place] and [facility].

## 2019-11-23 ENCOUNTER — Other Ambulatory Visit: Payer: Self-pay

## 2019-11-23 ENCOUNTER — Encounter: Payer: PPO | Admitting: *Deleted

## 2019-11-23 DIAGNOSIS — J449 Chronic obstructive pulmonary disease, unspecified: Secondary | ICD-10-CM | POA: Diagnosis not present

## 2019-11-23 NOTE — Progress Notes (Signed)
Daily Session Note  Patient Details  Name: LANAIYA LANTRY MRN: [id number] Date of Birth: 1950/06/08 Referring Provider:     Pulmonary Rehab from 11/01/2019 in [facility] Cardiac and Pulmonary Rehab  Referring Provider [name]      Encounter Date: 11/23/2019  Check In:  Session Check In - 11/23/19 1538      Check-In   Supervising physician immediately available to respond to emergencies See telemetry face sheet for immediately available ER MD    Location [facility]-Cardiac & Pulmonary Rehab    Staff Present Justin Mend RCP,RRT,BSRT;Tamon Parkerson Sherryll Burger, RN Margurite Auerbach, MS Exercise Physiologist    Virtual Visit No    Medication changes reported     No    Fall or balance concerns reported    No    Warm-up and Cool-down Performed on first and last piece of equipment    Resistance Training Performed Yes    VAD Patient? No    PAD/SET Patient? No      Pain Assessment   Currently in Pain? No/denies              Social History   Tobacco Use  Smoking Status Former Smoker  Smokeless Tobacco Never Used  Tobacco Comment   quit 10 years    Goals Met:  Independence with exercise equipment Exercise tolerated well No report of cardiac concerns or symptoms Strength training completed today  Goals Unmet:  Not Applicable  Comments: Pt able to follow exercise prescription today without complaint.  Will continue to monitor for progression.    Dr. [name] is Medical Director for [place] and [facility].

## 2019-11-24 ENCOUNTER — Encounter: Payer: PPO | Admitting: *Deleted

## 2019-11-24 ENCOUNTER — Other Ambulatory Visit: Payer: Self-pay

## 2019-11-24 DIAGNOSIS — J449 Chronic obstructive pulmonary disease, unspecified: Secondary | ICD-10-CM | POA: Diagnosis not present

## 2019-11-24 NOTE — Progress Notes (Signed)
Daily Session Note  Patient Details  Name: Abigail Hill MRN: [id number] Date of Birth: 1950/05/02 Referring Provider:     Pulmonary Rehab from 11/01/2019 in [facility] Cardiac and Pulmonary Rehab  Referring Provider [name]      Encounter Date: 11/24/2019  Check In:  Session Check In - 11/24/19 1527      Check-In   Supervising physician immediately available to respond to emergencies See telemetry face sheet for immediately available ER MD    Location [facility]-Cardiac & Pulmonary Rehab    Staff Present Renita Papa, RN BSN;Joseph [address] [place], [place], [place], CCRP, CCET    Virtual Visit No    Medication changes reported     No    Fall or balance concerns reported    No    Warm-up and Cool-down Performed on first and last piece of equipment    Resistance Training Performed Yes    VAD Patient? No    PAD/SET Patient? No      Pain Assessment   Currently in Pain? No/denies              Social History   Tobacco Use  Smoking Status Former Smoker  Smokeless Tobacco Never Used  Tobacco Comment   quit 10 years    Goals Met:  Independence with exercise equipment Exercise tolerated well No report of cardiac concerns or symptoms Strength training completed today  Goals Unmet:  Not Applicable  Comments: Pt able to follow exercise prescription today without complaint.  Will continue to monitor for progression.    Dr. [name] is Medical Director for [place] and [facility].

## 2019-11-28 ENCOUNTER — Other Ambulatory Visit: Payer: Self-pay

## 2019-11-28 ENCOUNTER — Encounter: Payer: PPO | Admitting: *Deleted

## 2019-11-28 DIAGNOSIS — J449 Chronic obstructive pulmonary disease, unspecified: Secondary | ICD-10-CM

## 2019-11-28 NOTE — Progress Notes (Signed)
Daily Session Note  Patient Details  Name: Abigail Hill MRN: [id number] Date of Birth: Nov 15, 1950 Referring Provider:     Pulmonary Rehab from 11/01/2019 in [facility] Cardiac and Pulmonary Rehab  Referring Provider [name]      Encounter Date: 11/28/2019  Check In:  Session Check In - 11/28/19 1538      Check-In   Supervising physician immediately available to respond to emergencies See telemetry face sheet for immediately available ER MD    Location [facility]-Cardiac & Pulmonary Rehab    Staff Present Renita Papa, RN Margurite Auerbach, MS Exercise Physiologist;Kelly Amedeo Plenty, BS, ACSM CEP, Exercise Physiologist    Virtual Visit No    Medication changes reported     No    Fall or balance concerns reported    No    Warm-up and Cool-down Performed on first and last piece of equipment    Resistance Training Performed Yes    VAD Patient? No    PAD/SET Patient? No      Pain Assessment   Currently in Pain? No/denies              Social History   Tobacco Use  Smoking Status Former Smoker  Smokeless Tobacco Never Used  Tobacco Comment   quit 10 years    Goals Met:  Independence with exercise equipment Exercise tolerated well No report of cardiac concerns or symptoms Strength training completed today  Goals Unmet:  Not Applicable  Comments: Pt able to follow exercise prescription today without complaint.  Will continue to monitor for progression.    Dr. [name] is Medical Director for [place] and [facility].

## 2019-11-30 ENCOUNTER — Encounter: Payer: PPO | Admitting: *Deleted

## 2019-11-30 ENCOUNTER — Other Ambulatory Visit: Payer: Self-pay

## 2019-11-30 DIAGNOSIS — J449 Chronic obstructive pulmonary disease, unspecified: Secondary | ICD-10-CM | POA: Diagnosis not present

## 2019-11-30 NOTE — Progress Notes (Signed)
Daily Session Note  Patient Details  Name: Abigail Hill MRN: [id number] Date of Birth: [birth year] Referring Provider:     Pulmonary Rehab from 11/01/2019 in [facility] Cardiac and Pulmonary Rehab  Referring Provider [name]      Encounter Date: 11/30/2019  Check In:  Session Check In - 11/30/19 1601      Check-In   Supervising physician immediately available to respond to emergencies See telemetry face sheet for immediately available ER MD    Location [facility]-Cardiac & Pulmonary Rehab    Staff Present Renita Papa, RN BSN;Joseph Lou [place], [place] Exercise Physiologist;Amanda Oletta Darter, [place], ACSM CEP, Exercise Physiologist    Virtual Visit No    Medication changes reported     No    Fall or balance concerns reported    No    Warm-up and Cool-down Performed on first and last piece of equipment    Resistance Training Performed Yes    VAD Patient? No    PAD/SET Patient? No      Pain Assessment   Currently in Pain? No/denies              Social History   Tobacco Use  Smoking Status Former Smoker  Smokeless Tobacco Never Used  Tobacco Comment   quit 10 years    Goals Met:  Independence with exercise equipment Exercise tolerated well No report of cardiac concerns or symptoms Strength training completed today  Goals Unmet:  Not Applicable  Comments: Pt able to follow exercise prescription today without complaint.  Will continue to monitor for progression.    Dr. [name] is Medical Director for [place] and [facility].

## 2019-12-01 ENCOUNTER — Other Ambulatory Visit: Payer: Self-pay

## 2019-12-01 ENCOUNTER — Encounter: Payer: PPO | Admitting: *Deleted

## 2019-12-01 DIAGNOSIS — J449 Chronic obstructive pulmonary disease, unspecified: Secondary | ICD-10-CM | POA: Diagnosis not present

## 2019-12-01 NOTE — Progress Notes (Signed)
Daily Session Note  Patient Details  Name: Abigail Hill MRN: [id number] Date of Birth: 11-01-1950 Referring Provider:     Pulmonary Rehab from 11/01/2019 in [facility] Cardiac and Pulmonary Rehab  Referring Provider [name]      Encounter Date: 12/01/2019  Check In:  Session Check In - 12/01/19 1529      Check-In   Supervising physician immediately available to respond to emergencies See telemetry face sheet for immediately available ER MD    Location [facility]-Cardiac & Pulmonary Rehab    Staff Present Renita Papa, RN BSN;Joseph [place] RCP,RRT,BSRT;Melissa [place] RDN, LDN    Virtual Visit No    Medication changes reported     No    Fall or balance concerns reported    No    Warm-up and Cool-down Performed on first and last piece of equipment    Resistance Training Performed Yes    VAD Patient? No    PAD/SET Patient? No      Pain Assessment   Currently in Pain? No/denies              Social History   Tobacco Use  Smoking Status Former Smoker  Smokeless Tobacco Never Used  Tobacco Comment   quit 10 years    Goals Met:  Independence with exercise equipment Exercise tolerated well No report of cardiac concerns or symptoms Strength training completed today  Goals Unmet:  Not Applicable  Comments: Pt able to follow exercise prescription today without complaint.  Will continue to monitor for progression.    Dr. [name] is Medical Director for [place] and [facility].

## 2019-12-05 ENCOUNTER — Encounter: Payer: PPO | Attending: Pulmonary Disease | Admitting: *Deleted

## 2019-12-05 ENCOUNTER — Other Ambulatory Visit: Payer: Self-pay

## 2019-12-05 DIAGNOSIS — J449 Chronic obstructive pulmonary disease, unspecified: Secondary | ICD-10-CM | POA: Insufficient documentation

## 2019-12-05 NOTE — Progress Notes (Signed)
Daily Session Note  Patient Details  Name: FREDNA STRICKER MRN: [id number] Date of Birth: January 09, 1951 Referring Provider:     Pulmonary Rehab from 11/01/2019 in [facility] Cardiac and Pulmonary Rehab  Referring Provider [name]      Encounter Date: 12/05/2019  Check In:  Session Check In - 12/05/19 1538      Check-In   Supervising physician immediately available to respond to emergencies See telemetry face sheet for immediately available ER MD    Location [facility]-Cardiac & Pulmonary Rehab    Staff Present Renita Papa, RN Margurite Auerbach, MS Exercise Physiologist;Kelly Amedeo Plenty, BS, ACSM CEP, Exercise Physiologist    Virtual Visit No    Medication changes reported     No    Fall or balance concerns reported    No    Warm-up and Cool-down Performed on first and last piece of equipment    Resistance Training Performed Yes    VAD Patient? No    PAD/SET Patient? No      Pain Assessment   Currently in Pain? No/denies              Social History   Tobacco Use  Smoking Status Former Smoker  Smokeless Tobacco Never Used  Tobacco Comment   quit 10 years    Goals Met:  Independence with exercise equipment Exercise tolerated well No report of cardiac concerns or symptoms Strength training completed today  Goals Unmet:  Not Applicable  Comments: Pt able to follow exercise prescription today without complaint.  Will continue to monitor for progression.    Dr. [name] is Medical Director for [place] and [facility].

## 2019-12-07 ENCOUNTER — Encounter: Payer: PPO | Admitting: *Deleted

## 2019-12-07 ENCOUNTER — Encounter: Payer: Self-pay | Admitting: *Deleted

## 2019-12-07 ENCOUNTER — Other Ambulatory Visit: Payer: Self-pay

## 2019-12-07 DIAGNOSIS — J449 Chronic obstructive pulmonary disease, unspecified: Secondary | ICD-10-CM | POA: Diagnosis not present

## 2019-12-07 NOTE — Progress Notes (Signed)
Daily Session Note  Patient Details  Name: Abigail Hill MRN: [id number] Date of Birth: [birth year] Referring Provider:     Pulmonary Rehab from 11/01/2019 in [facility] Cardiac and Pulmonary Rehab  Referring Provider [name]      Encounter Date: 12/07/2019  Check In:  Session Check In - 12/07/19 1618      Check-In   Supervising physician immediately available to respond to emergencies See telemetry face sheet for immediately available ER MD    Location [facility]-Cardiac & Pulmonary Rehab    Staff Present Heath Lark, RN, BSN, CCRP;Meredith Sherryll Burger, RN BSN;Joseph Lou Miner, [place] Exercise Physiologist    Virtual Visit No    Medication changes reported     No    Fall or balance concerns reported    No    Warm-up and Cool-down Performed on first and last piece of equipment    Resistance Training Performed Yes    VAD Patient? No    PAD/SET Patient? No      VAD patient   Has back up controller? No      Pain Assessment   Currently in Pain? No/denies              Social History   Tobacco Use  Smoking Status Former Smoker  Smokeless Tobacco Never Used  Tobacco Comment   quit 10 years    Goals Met:  Proper associated with RPD/PD & O2 Sat Independence with exercise equipment Exercise tolerated well No report of cardiac concerns or symptoms  Goals Unmet:  Not Applicable  Comments: Pt able to follow exercise prescription today without complaint.  Will continue to monitor for progression.    Dr. [name] is Medical Director for [place] and [facility].

## 2019-12-07 NOTE — Progress Notes (Signed)
Pulmonary Individual Treatment Plan  Patient Details  Name: Abigail Hill MRN: [id number] Date of Birth: 24-Jan-1951 Referring Provider:     Pulmonary Rehab from 11/01/2019 in [facility] Cardiac and Pulmonary Rehab  Referring Provider Aleskerov      Initial Encounter Date:    Pulmonary Rehab from 11/01/2019 in [facility] Cardiac and Pulmonary Rehab  Date 11/01/19      Visit Diagnosis: Chronic obstructive pulmonary disease, unspecified COPD type ([place])  Patient's Home Medications on Admission:  Current Outpatient Medications:  .  albuterol (PROVENTIL) (2.5 MG/3ML) 0.083% nebulizer solution, Take 3 mLs (2.5 mg total) by nebulization every 6 (six) hours as needed for wheezing or shortness of breath., Disp: 360 mL, Rfl: 0 .  albuterol (VENTOLIN HFA) 108 (90 Base) MCG/ACT inhaler, Inhale 2 puffs into the lungs every 6 (six) hours as needed for wheezing or shortness of breath. (Patient not taking: Reported on 09/07/2019), Disp: 8 g, Rfl: 0 .  alendronate (FOSAMAX) 70 MG tablet, Take 70 mg by mouth every Monday.  (Patient not taking: Reported on 09/07/2019), Disp: , Rfl:  .  aspirin 81 MG chewable tablet, Chew 81 mg by mouth daily., Disp: , Rfl:  .  Calcium Carb-Cholecalciferol (OYSTER SHELL CALCIUM) 500-400 MG-UNIT TABS, Take 1 tablet by mouth daily.  (Patient not taking: Reported on 10/28/2019), Disp: , Rfl:  .  furosemide (LASIX) 20 MG tablet, Take 1 tablet (20 mg total) by mouth 2 (two) times daily. (Patient not taking: Reported on 10/28/2019), Disp: 60 tablet, Rfl: 0 .  ketoconazole (NIZORAL) 2 % shampoo, Apply topically 3 (three) times a week. (Patient not taking: Reported on 10/28/2019), Disp: , Rfl:  .  mirabegron ER (MYRBETRIQ) 50 MG TB24 tablet, Take 1 tablet (50 mg total) by mouth daily., Disp: 30 tablet, Rfl: 11 .  Multiple Vitamins-Minerals (CENTRUM SILVER PO), Take 1 tablet by mouth daily.  (Patient not taking: Reported on 10/28/2019), Disp: , Rfl:  .  nystatin-triamcinolone ointment (MYCOLOG), Apply 1  application topically 2 (two) times daily. (Patient not taking: Reported on 10/28/2019), Disp: 30 g, Rfl: 0 .  torsemide (DEMADEX) 20 MG tablet, Take 20 mg by mouth daily. (Patient not taking: Reported on 10/28/2019), Disp: , Rfl:  .  umeclidinium-vilanterol (ANORO ELLIPTA) 62.5-25 MCG/INH AEPB, Inhale 1 puff into the lungs daily. (Patient not taking: Reported on 09/07/2019), Disp: 60 each, Rfl: 0  Past Medical History: Past Medical History:  Diagnosis Date  . Abnormal gait 12/18/2015  . BP (high blood pressure) 08/24/2015  . Cancer ([place])    skin  . Constriction of ureter (postoperative) 07/28/2015  . Hydronephrosis 07/14/2015  . Hypertension   . Left foot drop 12/18/2015  . Recurrent UTI   . UPJ (ureteropelvic junction) obstruction 07/28/2015  . UTI (lower urinary tract infection) 07/14/2015    Tobacco Use: Social History   Tobacco Use  Smoking Status Former Smoker  Smokeless Tobacco Never Used  Tobacco Comment   quit 10 years    Labs: Recent Review Scientist, physiological    Labs for ITP Cardiac and Pulmonary Rehab Latest Ref Rng & Units 05/16/2019   PHART 7.35 - 7.45 7.38   PCO2ART 32 - 48 mmHg 88(HH)   HCO3 20.0 - 28.0 mmol/L 52.1(H)   O2SAT % 92.6       Pulmonary Assessment Scores:  Pulmonary Assessment Scores    Row Name 11/01/19 1431 11/02/19 1624       ADL UCSD   SOB Score total 54 --    Rest 0 --  Walk 3 --    Bath 0 --    Dress 1 --    Shop 5 --      CAT Score   CAT Score 18 --      mMRC Score   mMRC Score -- 2           UCSD: Self-administered rating of dyspnea associated with activities of daily living (ADLs) 6-point scale (0 = "not at all" to 5 = "maximal or unable to do because of breathlessness")  Scoring Scores range from 0 to 120.  Minimally important difference is 5 units  CAT: CAT can identify the health impairment of COPD patients and is better correlated with disease progression.  CAT has a scoring range of zero to 40. The CAT score is  classified into four groups of low (less than 10), medium (10 - 20), high (21-30) and very high (31-40) based on the impact level of disease on health status. A CAT score over 10 suggests significant symptoms.  A worsening CAT score could be explained by an exacerbation, poor medication adherence, poor inhaler technique, or progression of COPD or comorbid conditions.  CAT MCID is 2 points  mMRC: mMRC (Modified Medical Research Council) Dyspnea Scale is used to assess the degree of baseline functional disability in patients of respiratory disease due to dyspnea. No minimal important difference is established. A decrease in score of 1 point or greater is considered a positive change.   Pulmonary Function Assessment:  Pulmonary Function Assessment - 11/01/19 1430      Pulmonary Function Tests   FVC% 76 %    FEV1% 45 %    FEV1/FVC Ratio 47    RV% 77 %    DLCO% 61 %           Exercise Target Goals: Exercise Program Goal: Individual exercise prescription set using results from initial 6 min walk test and THRR while considering  patient's activity barriers and safety.   Exercise Prescription Goal: Initial exercise prescription builds to 30-45 minutes a day of aerobic activity, 2-3 days per week.  Home exercise guidelines will be given to patient during program as part of exercise prescription that the participant will acknowledge.  Education: Aerobic Exercise & Resistance Training: - Gives group verbal and written instruction on the various components of exercise. Focuses on aerobic and resistive training programs and the benefits of this training and how to safely progress through these programs..   Education: Exercise & Equipment Safety: - Individual verbal instruction and demonstration of equipment use and safety with use of the equipment.   Pulmonary Rehab from 11/30/2019 in [facility] Cardiac and Pulmonary Rehab  Date 11/01/19  Educator AS  Instruction Review Code 1- Verbalizes  Understanding      Education: Exercise Physiology & General Exercise Guidelines: - Group verbal and written instruction with models to review the exercise physiology of the cardiovascular system and associated critical values. Provides general exercise guidelines with specific guidelines to those with heart or lung disease.    Education: Flexibility, Balance, Mind/Body Relaxation: Provides group verbal/written instruction on the benefits of flexibility and balance training, including mind/body exercise modes such as yoga, pilates and tai chi.  Demonstration and skill practice provided.   Pulmonary Rehab from 11/30/2019 in [facility] Cardiac and Pulmonary Rehab  Date 11/02/19  Educator AS  Instruction Review Code 1- Verbalizes Understanding      Activity Barriers & Risk Stratification:  Activity Barriers & Cardiac Risk Stratification - 10/28/19 1415  Activity Barriers & Cardiac Risk Stratification   Activity Barriers Balance Concerns;Assistive Device;Other (comment);Muscular Weakness    Comments broken collar bone easter 2020- no restrictions           6 Minute Walk:  6 Minute Walk    Row Name 11/01/19 1434 11/02/19 1623       6 Minute Walk   Phase Initial --    Distance 195 feet --    Walk Time 6 minutes --    # of Rest Breaks 0 --    MPH 0.37 --    METS 0.9 --    RPE -- 13    Perceived Dyspnea  -- 1    VO2 Peak 3.11 --    Symptoms No --    Resting HR 87 bpm --    Resting BP 122/66 --    Resting Oxygen Saturation  94 % --    Exercise Oxygen Saturation  during 6 min walk 85 % --    Max Ex. HR 112 bpm --    Max Ex. BP 140/62 --    2 Minute Post BP 130/64 --      Interval HR   1 Minute HR 101 --    2 Minute HR 100 --    3 Minute HR 108 --    4 Minute HR 110 --    5 Minute HR 110 --    6 Minute HR 112 --    2 Minute Post HR 96 --    Interval Heart Rate? Yes --      Interval Oxygen   Interval Oxygen? Yes --    Baseline Oxygen Saturation % 94 % --    1 Minute  Oxygen Saturation % 90 % --    1 Minute Liters of Oxygen 0 L --    2 Minute Oxygen Saturation % 88 % --    2 Minute Liters of Oxygen 0 L --    3 Minute Oxygen Saturation % 87 % --    3 Minute Liters of Oxygen 0 L --    4 Minute Oxygen Saturation % 85 % --    4 Minute Liters of Oxygen 0 L --    5 Minute Oxygen Saturation % 87 % --    5 Minute Liters of Oxygen 0 L --    6 Minute Oxygen Saturation % 86 % --    6 Minute Liters of Oxygen 0 L --    2 Minute Post Oxygen Saturation % 90 % --    2 Minute Post Liters of Oxygen 0 L --          Oxygen Initial Assessment:  Oxygen Initial Assessment - 10/28/19 1410      Home Oxygen   Home Oxygen Device Portable Concentrator    Sleep Oxygen Prescription Continuous    Liters per minute 2    Home Exercise Oxygen Prescription Continuous   O2 is usually above 94% during the day without any oxygen, wears pulse ox and monitors during the day   Liters per minute 2    Home Resting Oxygen Prescription Continuous    Liters per minute 2    Compliance with Home Oxygen Use Yes   only wears if O2 sat drops     Intervention   Short Term Goals To learn and exhibit compliance with exercise, home and travel O2 prescription;To learn and understand importance of monitoring SPO2 with pulse oximeter and demonstrate accurate use of the pulse oximeter.;To learn  and understand importance of maintaining oxygen saturations>88%;To learn and demonstrate proper pursed lip breathing techniques or other breathing techniques.    Long  Term Goals Exhibits compliance with exercise, home and travel O2 prescription;Maintenance of O2 saturations>88%;Verbalizes importance of monitoring SPO2 with pulse oximeter and return demonstration;Exhibits proper breathing techniques, such as pursed lip breathing or other method taught during program session           Oxygen Re-Evaluation:  Oxygen Re-Evaluation    Row Name 11/17/19 1550             Program Oxygen Prescription    Program Oxygen Prescription None       Liters per minute 0         Home Oxygen   Home Oxygen Device Home Concentrator;Portable Concentrator       Sleep Oxygen Prescription Continuous       Liters per minute 2       Home Exercise Oxygen Prescription None       Liters per minute 0       Home Resting Oxygen Prescription None       Liters per minute 0       Compliance with Home Oxygen Use Yes         Goals/Expected Outcomes   Short Term Goals To learn and understand importance of monitoring SPO2 with pulse oximeter and demonstrate accurate use of the pulse oximeter.;To learn and exhibit compliance with exercise, home and travel O2 prescription;To learn and understand importance of maintaining oxygen saturations>88%;To learn and demonstrate proper pursed lip breathing techniques or other breathing techniques.;To learn and demonstrate proper use of respiratory medications       Long  Term Goals Exhibits compliance with exercise, home and travel O2 prescription;Verbalizes importance of monitoring SPO2 with pulse oximeter and return demonstration;Maintenance of O2 saturations>88%;Exhibits proper breathing techniques, such as pursed lip breathing or other method taught during program session;Compliance with respiratory medication;Demonstrates proper use of MDI's       Comments Informed patient how to perform the Pursed Lipped breathing technique. Told patient to Inhale through the nose and out the mouth with pursed lips to keep their airways open, help oxygenate them better, practice when at rest or doing strenuous activity. Patient Verbalizes understanding of technique and will work on and be reiterated during [place].       Goals/Expected Outcomes Short: use PLB with exertion. Long: use PLB on exertion proficiently and independently.              Oxygen Discharge (Final Oxygen Re-Evaluation):  Oxygen Re-Evaluation - 11/17/19 1550      Program Oxygen Prescription   Program Oxygen Prescription  None    Liters per minute 0      Home Oxygen   Home Oxygen Device Home Concentrator;Portable Concentrator    Sleep Oxygen Prescription Continuous    Liters per minute 2    Home Exercise Oxygen Prescription None    Liters per minute 0    Home Resting Oxygen Prescription None    Liters per minute 0    Compliance with Home Oxygen Use Yes      Goals/Expected Outcomes   Short Term Goals To learn and understand importance of monitoring SPO2 with pulse oximeter and demonstrate accurate use of the pulse oximeter.;To learn and exhibit compliance with exercise, home and travel O2 prescription;To learn and understand importance of maintaining oxygen saturations>88%;To learn and demonstrate proper pursed lip breathing techniques or other breathing techniques.;To learn and demonstrate proper use  of respiratory medications    Long  Term Goals Exhibits compliance with exercise, home and travel O2 prescription;Verbalizes importance of monitoring SPO2 with pulse oximeter and return demonstration;Maintenance of O2 saturations>88%;Exhibits proper breathing techniques, such as pursed lip breathing or other method taught during program session;Compliance with respiratory medication;Demonstrates proper use of MDI's    Comments Informed patient how to perform the Pursed Lipped breathing technique. Told patient to Inhale through the nose and out the mouth with pursed lips to keep their airways open, help oxygenate them better, practice when at rest or doing strenuous activity. Patient Verbalizes understanding of technique and will work on and be reiterated during [place].    Goals/Expected Outcomes Short: use PLB with exertion. Long: use PLB on exertion proficiently and independently.           Initial Exercise Prescription:  Initial Exercise Prescription - 11/01/19 1400      Date of Initial Exercise RX and Referring Provider   Date 11/01/19    Referring Provider Aleskerov      Treadmill   MPH 0.4     Grade 0    Minutes 15    METs 1      Recumbant Bike   Level 1    RPM 60    Minutes 15    METs 1      NuStep   Level 1    SPM 80    Minutes 15    METs 1      Prescription Details   Frequency (times per week) 3    Duration Progress to 30 minutes of continuous aerobic without signs/symptoms of physical distress      Intensity   THRR 40-80% of Max Heartrate 111-137    Ratings of Perceived Exertion 11-15    Perceived Dyspnea 0-4      Resistance Training   Training Prescription Yes    Weight 2 lb    Reps 10-15           Perform Capillary Blood Glucose checks as needed.  Exercise Prescription Changes:  Exercise Prescription Changes    Row Name 11/01/19 1400 11/02/19 1600 11/15/19 0700 11/28/19 1100       Response to Exercise   Blood Pressure (Admit) 122/66 -- 136/76 126/80    Blood Pressure (Exercise) 140/62 -- 140/74 142/80    Blood Pressure (Exit) 130/64 -- 122/70 132/64    Heart Rate (Admit) 87 bpm -- 88 bpm 88 bpm    Heart Rate (Exercise) 112 bpm -- 98 bpm 93 bpm    Heart Rate (Exit) 96 bpm -- 84 bpm 79 bpm    Oxygen Saturation (Admit) 94 % -- 95 % 94 %    Oxygen Saturation (Exercise) 85 % -- 92 % 93 %    Oxygen Saturation (Exit) 90 % -- 95 % 94 %    Rating of Perceived Exertion (Exercise) -- _0 Perceived Dyspnea (Exercise) -- 1 1 0    Symptoms none -- none none    Comments walk test -- -- --    Duration -- -- Progress to 30 minutes of  aerobic without signs/symptoms of physical distress Progress to 30 minutes of  aerobic without signs/symptoms of physical distress    Intensity -- -- THRR unchanged THRR unchanged      Progression   Progression -- -- Continue to progress workloads to maintain intensity without signs/symptoms of physical distress. Continue to progress workloads to maintain intensity without signs/symptoms of physical distress.  Average METs -- -- 1.95 2      Resistance Training   Training Prescription -- -- Yes Yes    Weight -- --  2 lb 2 lb    Reps -- -- 10-15 10-15      Interval Training   Interval Training -- -- No No      Treadmill   MPH -- -- 0.4 --    Grade -- -- 0 --    Minutes -- -- 5 --    METs -- -- 1.3 --      Recumbant Bike   Level -- -- 1 1    Minutes -- -- 15 15    METs -- -- 2.3 2.4      NuStep   Level -- -- 1 1    SPM -- -- -- 80    Minutes -- -- 15 15    METs -- -- 2.1 1.6           Exercise Comments:   Exercise Goals and Review:  Exercise Goals    Row Name 11/01/19 1440             Exercise Goals   Increase Physical Activity Yes       Intervention Provide advice, education, support and counseling about physical activity/exercise needs.;Develop an individualized exercise prescription for aerobic and resistive training based on initial evaluation findings, risk stratification, comorbidities and participant's personal goals.       Expected Outcomes Short Term: Attend rehab on a regular basis to increase amount of physical activity.;Long Term: Add in home exercise to make exercise part of routine and to increase amount of physical activity.       Increase Strength and Stamina Yes       Intervention Provide advice, education, support and counseling about physical activity/exercise needs.;Develop an individualized exercise prescription for aerobic and resistive training based on initial evaluation findings, risk stratification, comorbidities and participant's personal goals.       Expected Outcomes Short Term: Increase workloads from initial exercise prescription for resistance, speed, and METs.;Short Term: Perform resistance training exercises routinely during rehab and add in resistance training at home;Long Term: Improve cardiorespiratory fitness, muscular endurance and strength as measured by increased METs and functional capacity (6MWT)       Able to understand and use rate of perceived exertion (RPE) scale Yes       Intervention Provide education and explanation on how to use RPE  scale       Expected Outcomes Short Term: Able to use RPE daily in rehab to express subjective intensity level;Long Term:  Able to use RPE to guide intensity level when exercising independently       Able to understand and use Dyspnea scale Yes       Intervention Provide education and explanation on how to use Dyspnea scale       Expected Outcomes Short Term: Able to use Dyspnea scale daily in rehab to express subjective sense of shortness of breath during exertion;Long Term: Able to use Dyspnea scale to guide intensity level when exercising independently       Knowledge and understanding of Target Heart Rate Range (THRR) Yes       Intervention Provide education and explanation of THRR including how the numbers were predicted and where they are located for reference       Expected Outcomes Short Term: Able to state/look up THRR;Short Term: Able to use daily as guideline for intensity in rehab;Long Term: Able  to use THRR to govern intensity when exercising independently       Able to check pulse independently Yes       Intervention Provide education and demonstration on how to check pulse in carotid and radial arteries.;Review the importance of being able to check your own pulse for safety during independent exercise       Expected Outcomes Short Term: Able to explain why pulse checking is important during independent exercise;Long Term: Able to check pulse independently and accurately       Understanding of Exercise Prescription Yes       Intervention Provide education, explanation, and written materials on patient's individual exercise prescription       Expected Outcomes Short Term: Able to explain program exercise prescription;Long Term: Able to explain home exercise prescription to exercise independently              Exercise Goals Re-Evaluation :  Exercise Goals Re-Evaluation    Row Name 11/02/19 1529 11/15/19 0732 11/17/19 1600 11/28/19 1129       Exercise Goal Re-Evaluation   Exercise  Goals Review Increase Physical Activity;Able to understand and use rate of perceived exertion (RPE) scale;Knowledge and understanding of Target Heart Rate Range (THRR);Understanding of Exercise Prescription;Increase Strength and Stamina;Able to check pulse independently Increase Physical Activity;Increase Strength and Stamina;Understanding of Exercise Prescription Increase Physical Activity;Increase Strength and Stamina Increase Physical Activity;Increase Strength and Stamina    Comments Reviewed RPE and dyspnea scales, THR and program prescription with pt today.  Pt voiced understanding and was given a copy of goals to take home. Kalyssa is off to a good start in rehab.  She is up to 5 min on the treadmill and 10 watts on the bike.  We will continue to monitor her progress. On her off days she rides her bike at home for 15 minutes. She is improving quickly in [facility] and is able to walk better with her walker. Once she gets more strength she is going to try the treadmill. Donetta has tried the TM and did well.  She is progressing well on other machines.    Expected Outcomes Short: Use RPE daily to regulate intensity. Long: Follow program prescription in THR. Short: Continue to attend regularly Long: Continue to follow program prescription. Short: use the treadmill. Long: continue to gain strength and maintain an exercise routine at home Short: continue to build time on TM Long: build overall stamina           Discharge Exercise Prescription (Final Exercise Prescription Changes):  Exercise Prescription Changes - 11/28/19 1100      Response to Exercise   Blood Pressure (Admit) 126/80    Blood Pressure (Exercise) 142/80    Blood Pressure (Exit) 132/64    Heart Rate (Admit) 88 bpm    Heart Rate (Exercise) 93 bpm    Heart Rate (Exit) 79 bpm    Oxygen Saturation (Admit) 94 %    Oxygen Saturation (Exercise) 93 %    Oxygen Saturation (Exit) 94 %    Rating of Perceived Exertion (Exercise) 12    Perceived  Dyspnea (Exercise) 0    Symptoms none    Duration Progress to 30 minutes of  aerobic without signs/symptoms of physical distress    Intensity THRR unchanged      Progression   Progression Continue to progress workloads to maintain intensity without signs/symptoms of physical distress.    Average METs 2      Resistance Training   Training Prescription Yes  Weight 2 lb    Reps 10-15      Interval Training   Interval Training No      Recumbant Bike   Level 1    Minutes 15    METs 2.4      NuStep   Level 1    SPM 80    Minutes 15    METs 1.6           Nutrition:  Target Goals: Understanding of nutrition guidelines, daily intake of sodium <1523m, cholesterol <2028m calories 30% from fat and 7% or less from saturated fats, daily to have 5 or more servings of fruits and vegetables.  Education: Controlling Sodium/Reading Food Labels -Group verbal and written material supporting the discussion of sodium use in heart healthy nutrition. Review and explanation with models, verbal and written materials for utilization of the food label.   Education: General Nutrition Guidelines/Fats and Fiber: -Group instruction provided by verbal, written material, models and posters to present the general guidelines for heart healthy nutrition. Gives an explanation and review of dietary fats and fiber.   Biometrics:  Pre Biometrics - 11/01/19 1442      Pre Biometrics   Height 5' 1.25" (1.556 m)    Weight 158 lb 4.8 oz (71.8 kg)    BMI (Calculated) 29.66            Nutrition Therapy Plan and Nutrition Goals:  Nutrition Therapy & Goals - 11/10/19 1215      Nutrition Therapy   Diet heart healthy, low Na, Pulmonary MNT    Protein (specify units) 85g    Fiber 25 grams    Whole Grain Foods 3 servings    Saturated Fats 12 max. grams    Fruits and Vegetables 5 servings/day    Sodium 1.5 grams      Personal Nutrition Goals   Nutrition Goal ST: add in beans, try daves bread LT:  Buiding muscle to increase strength and balance    Comments B: oatmeal with raisins and brown sugar or cheerios with bananas, weekend bacon or sausage and eggs. L: sandwich, fruit, and low fat chips. D: chicken, pork, or ground beefs, tacos and spaghetti. Her husband cooks for her and he doesn't like whole wheat - suggested a pt favorite Daves killer bread and adding fiber in other ways like including beans. Reviewed heart healthy eating and pulmonary MNT.      Intervention Plan   Intervention Prescribe, educate and counsel regarding individualized specific dietary modifications aiming towards targeted core components such as weight, hypertension, lipid management, diabetes, heart failure and other comorbidities.;Nutrition handout(s) given to patient.    Expected Outcomes Short Term Goal: Understand basic principles of dietary content, such as calories, fat, sodium, cholesterol and nutrients.;Short Term Goal: A plan has been developed with personal nutrition goals set during dietitian appointment.;Long Term Goal: Adherence to prescribed nutrition plan.           Nutrition Assessments:  Nutrition Assessments - 11/01/19 1433      MEDFICTS Scores   Pre Score 22           MEDIFICTS Score Key:          ?70 Need to make dietary changes          40-70 Heart Healthy Diet         ? 40 Therapeutic Level Cholesterol Diet  Nutrition Goals Re-Evaluation:   Nutrition Goals Discharge (Final Nutrition Goals Re-Evaluation):   Psychosocial: Target Goals: Acknowledge presence or absence  of significant depression and/or stress, maximize coping skills, provide positive support system. Participant is able to verbalize types and ability to use techniques and skills needed for reducing stress and depression.   Education: Depression - Provides group verbal and written instruction on the correlation between heart/lung disease and depressed mood, treatment options, and the stigmas associated with seeking  treatment.   Education: Sleep Hygiene -Provides group verbal and written instruction about how sleep can affect your health.  Define sleep hygiene, discuss sleep cycles and impact of sleep habits. Review good sleep hygiene tips.    Education: Stress and Anxiety: - Provides group verbal and written instruction about the health risks of elevated stress and causes of high stress.  Discuss the correlation between heart/lung disease and anxiety and treatment options. Review healthy ways to manage with stress and anxiety.   Initial Review & Psychosocial Screening:  Initial Psych Review & Screening - 10/28/19 1417      Initial Review   Current issues with Current Stress Concerns    Source of Stress Concerns Chronic Illness;Unable to perform yard/household activities;Unable to participate in former interests or hobbies      [place]? Yes   husband; children and grandchildren     Barriers   Psychosocial barriers to participate in program There are no identifiable barriers or psychosocial needs.;The patient should benefit from training in stress management and relaxation.      Screening Interventions   Interventions Encouraged to exercise;To provide support and resources with identified psychosocial needs;Provide feedback about the scores to participant    Expected Outcomes Short Term goal: Utilizing psychosocial counselor, staff and physician to assist with identification of specific Stressors or current issues interfering with healing process. Setting desired goal for each stressor or current issue identified.;Long Term Goal: Stressors or current issues are controlled or eliminated.;Short Term goal: Identification and review with participant of any Quality of Life or Depression concerns found by scoring the questionnaire.;Long Term goal: The participant improves quality of Life and PHQ9 Scores as seen by post scores and/or verbalization of changes            Quality of Life Scores:  Scores of 19 and below usually indicate a poorer quality of life in these areas.  A difference of  2-3 points is a clinically meaningful difference.  A difference of 2-3 points in the total score of the Quality of Life Index has been associated with significant improvement in overall quality of life, self-image, physical symptoms, and general health in studies assessing change in quality of life.  PHQ-9: Recent Review Flowsheet Data    Depression screen [facility] 2/9 11/01/2019 10/28/2016   Decreased Interest - 0   Down, Depressed, Hopeless 0 0   PHQ - 2 Score 0 0   Altered sleeping 0 -   Tired, decreased energy 0 -   Change in appetite 0 -   Feeling bad or failure about yourself  0 -   Trouble concentrating 0 -   Moving slowly or fidgety/restless 2  -   Suicidal thoughts 0 -   PHQ-9 Score 2 -   Difficult doing work/chores Somewhat difficult -     Interpretation of Total Score  Total Score Depression Severity:  1-4 = Minimal depression, 5-9 = Mild depression, 10-14 = Moderate depression, 15-19 = Moderately severe depression, 20-27 = Severe depression   Psychosocial Evaluation and Intervention:  Psychosocial Evaluation - 10/28/19 1426      Psychosocial Evaluation &  Interventions   Interventions Encouraged to exercise with the program and follow exercise prescription    Comments Myles has had a decline in her health more so this last year and was surprised with the breathing issues. She states her change in gait has led to her using a walker, she only wears oxygen at night because her sats have been good during the day, and her family thinks this is the way things will be from here on out. She wants to get stronger and feel more steady on her feet. She feels like coming here will help her understand what is going on and boost her stamina    Expected Outcomes Short: attend pulmonary rehab for exercise and education. Long: develop and maintain positive self care  habits.    Continue Psychosocial Services  Follow up required by staff           Psychosocial Re-Evaluation:  Psychosocial Re-Evaluation    [place] Name 11/17/19 1559             Psychosocial Re-Evaluation   Current issues with Current Stress Concerns       Comments Patient reports no issues with their current mental states, sleep, stress, depression or anxiety. Will follow up with patient in a few weeks for any changes.       Expected Outcomes Short: Continue to exercise regularly to support mental health and notify staff of any changes. Long: maintain mental health and well being through teaching of rehab or prescribed medications independently.       Interventions Encouraged to attend Pulmonary Rehabilitation for the exercise       Continue Psychosocial Services  Follow up required by staff         Initial Review   Source of Stress Concerns Chronic Illness;Unable to perform yard/household activities              Psychosocial Discharge (Final Psychosocial Re-Evaluation):  Psychosocial Re-Evaluation - 11/17/19 1559      Psychosocial Re-Evaluation   Current issues with Current Stress Concerns    Comments Patient reports no issues with their current mental states, sleep, stress, depression or anxiety. Will follow up with patient in a few weeks for any changes.    Expected Outcomes Short: Continue to exercise regularly to support mental health and notify staff of any changes. Long: maintain mental health and well being through teaching of rehab or prescribed medications independently.    Interventions Encouraged to attend Pulmonary Rehabilitation for the exercise    Continue Psychosocial Services  Follow up required by staff      Initial Review   Source of Stress Concerns Chronic Illness;Unable to perform yard/household activities           Education: Education Goals: Education classes will be provided on a weekly basis, covering required topics. Participant will state  understanding/return demonstration of topics presented.  Learning Barriers/Preferences:  Learning Barriers/Preferences - 10/28/19 1417      Learning Barriers/Preferences   Learning Barriers None    Learning Preferences None           General Pulmonary Education Topics:  Infection Prevention: - Provides verbal and written material to individual with discussion of infection control including proper hand washing and proper equipment cleaning during exercise session.   Pulmonary Rehab from 11/30/2019 in [facility] Cardiac and Pulmonary Rehab  Date 11/01/19  Educator AS  Instruction Review Code 1- Verbalizes Understanding      Falls Prevention: - Provides verbal and written material to individual  with discussion of falls prevention and safety.   Pulmonary Rehab from 11/30/2019 in [facility] Cardiac and Pulmonary Rehab  Date 11/01/19  Educator AS  Instruction Review Code 1- Verbalizes Understanding      Chronic Lung Diseases: - Group verbal and written instruction to review updates, respiratory medications, advancements in procedures and treatments. Discuss use of supplemental oxygen including available portable oxygen systems, continuous and intermittent flow rates, concentrators, personal use and safety guidelines. Review proper use of inhaler and spacers. Provide informative websites for self-education.    Pulmonary Rehab from 11/30/2019 in [facility] Cardiac and Pulmonary Rehab  Date 11/30/19  Educator [facility]  Instruction Review Code 1- Verbalizes Understanding      Energy Conservation: - Provide group verbal and written instruction for methods to conserve energy, plan and organize activities. Instruct on pacing techniques, use of adaptive equipment and posture/positioning to relieve shortness of breath.   Pulmonary Rehab from 11/30/2019 in [facility] Cardiac and Pulmonary Rehab  Date 11/30/19  Educator [facility]  Instruction Review Code 1- Verbalizes Understanding      Triggers and Exacerbations: - Group  verbal and written instruction to review types of environmental triggers and ways to prevent exacerbations. Discuss weather changes, air quality and the benefits of nasal washing. Review warning signs and symptoms to help prevent infections. Discuss techniques for effective airway clearance, coughing, and vibrations.   Pulmonary Rehab from 11/30/2019 in [facility] Cardiac and Pulmonary Rehab  Date 11/30/19  Educator [facility]  Instruction Review Code 1- Verbalizes Understanding      AED/CPR: - Group verbal and written instruction with the use of models to demonstrate the basic use of the AED with the basic ABC's of resuscitation.   Anatomy and Physiology of the Lungs: - Group verbal and written instruction with the use of models to provide basic lung anatomy and physiology related to function, structure and complications of lung disease.   Pulmonary Rehab from 11/30/2019 in [facility] Cardiac and Pulmonary Rehab  Date 11/30/19  Educator [facility]  Instruction Review Code 1- Verbalizes Understanding      Anatomy & Physiology of the Heart: - Group verbal and written instruction and models provide basic cardiac anatomy and physiology, with the coronary electrical and arterial systems. Review of Valvular disease and Heart Failure   Cardiac Medications: - Group verbal and written instruction to review commonly prescribed medications for heart disease. Reviews the medication, class of the drug, and side effects.   Pulmonary Rehab from 11/30/2019 in [facility] Cardiac and Pulmonary Rehab  Date 11/16/19  Educator SB  Instruction Review Code 1- Verbalizes Understanding      Other: -Provides group and verbal instruction on various topics (see comments)   Knowledge Questionnaire Score:  Knowledge Questionnaire Score - 11/01/19 1433      Knowledge Questionnaire Score   Pre Score 14/18            Core Components/Risk Factors/Patient Goals at Admission:  Personal Goals and Risk Factors at Admission - 11/01/19 1444       Core Components/Risk Factors/Patient Goals on Admission    Weight Management Yes    Intervention Weight Management: Develop a combined nutrition and exercise program designed to reach desired caloric intake, while maintaining appropriate intake of nutrient and fiber, sodium and fats, and appropriate energy expenditure required for the weight goal.;Weight Management: Provide education and appropriate resources to help participant work on and attain dietary goals.    Admit Weight 158 lb 4.8 oz (71.8 kg)    Goal Weight: Short Term  155 lb (70.3 kg)    Goal Weight: Long Term 155 lb (70.3 kg)    Expected Outcomes Weight Maintenance: Understanding of the daily nutrition guidelines, which includes 25-35% calories from fat, 7% or less cal from saturated fats, less than 224m cholesterol, less than 1.5gm of sodium, & 5 or more servings of fruits and vegetables daily    Hypertension Yes    Intervention Provide education on lifestyle modifcations including regular physical activity/exercise, weight management, moderate sodium restriction and increased consumption of fresh fruit, vegetables, and low fat dairy, alcohol moderation, and smoking cessation.;Monitor prescription use compliance.    Expected Outcomes Long Term: Maintenance of blood pressure at goal levels.;Short Term: Continued assessment and intervention until BP is < 140/987mHG in hypertensive participants. < 130/8026mG in hypertensive participants with diabetes, heart failure or chronic kidney disease.           Education:Diabetes - Individual verbal and written instruction to review signs/symptoms of diabetes, desired ranges of glucose level fasting, after meals and with exercise. Acknowledge that pre and post exercise glucose checks will be done for 3 sessions at entry of program.   Education: Know Your Numbers and Risk Factors: -Group verbal and written instruction about important numbers in your health.  Discussion of what are risk  factors and how they play a role in the disease process.  Review of Cholesterol, Blood Pressure, Diabetes, and BMI and the role they play in your overall health.   [facility]  Date 11/23/19  Educator SB  Instruction Review Code 1- Verbalizes Understanding      Core Components/Risk Factors/Patient Goals Review:   Goals and Risk Factor Review    Row Name 11/17/19 1553             Core Components/Risk Factors/Patient Goals Review   Personal Goals Review Weight Management/Obesity;Hypertension       Review Spoke to patient about their shortness of breath and what they can do to improve. Patient has been informed of breathing techniques when starting the program. Patient is informed to tell staff if they have had any med changes and that certain meds they are taking or not taking can be causing shortness of breath.       Expected Outcomes Short: Attend [facility] regularly to improve shortness of breath with ADL's. Long: maintain independence with ADL's              Core Components/Risk Factors/Patient Goals at Discharge (Final Review):   Goals and Risk Factor Review - 11/17/19 1553      Core Components/Risk Factors/Patient Goals Review   Personal Goals Review Weight Management/Obesity;Hypertension    Review Spoke to patient about their shortness of breath and what they can do to improve. Patient has been informed of breathing techniques when starting the program. Patient is informed to tell staff if they have had any med changes and that certain meds they are taking or not taking can be causing shortness of breath.    Expected Outcomes Short: Attend [facility] regularly to improve shortness of breath with ADL's. Long: maintain independence with ADL's           ITP Comments:  ITP Comments    Row Name 10/28/19 1425 11/01/19 1446 11/02/19 1528 11/09/19 1506 11/10/19 1232   ITP Comments Initial telephone encounter completed.  Diagnosis can be found in [facility] 7/15. EP orientation scheduled for 8/31 at 1pm. Completed 6MWT and gym orientation. Initial ITP created and  sent for review to Dr. [name], Medical Director. First full day of exercise!  Patient was oriented to gym and equipment including functions, settings, policies, and procedures.  Patient's individual exercise prescription and treatment plan were reviewed.  All starting workloads were established based on the results of the 6 minute walk test done at initial orientation visit.  The plan for exercise progression was also introduced and progression will be customized based on patient's performance and goals 30 day review completed. ITP sent to Dr. [name], Medical Director of Cardiac and Pulmonary Rehab. Continue with ITP unless changes are made by physician. Completed initial RD Evaluation   Row Name 12/07/19 0533           ITP Comments 30 Day review completed. Medical Director ITP review done, changes made as directed, and signed approval by Medical Director.              Comments:

## 2019-12-08 ENCOUNTER — Other Ambulatory Visit: Payer: Self-pay

## 2019-12-08 ENCOUNTER — Encounter: Payer: PPO | Admitting: *Deleted

## 2019-12-08 DIAGNOSIS — J449 Chronic obstructive pulmonary disease, unspecified: Secondary | ICD-10-CM

## 2019-12-08 NOTE — Progress Notes (Signed)
Daily Session Note  Patient Details  Name: Abigail Hill MRN: [id number] Date of Birth: May 21, 1950 Referring Provider:     Pulmonary Rehab from 11/01/2019 in [facility] Cardiac and Pulmonary Rehab  Referring Provider [name]      Encounter Date: 12/08/2019  Check In:  Session Check In - 12/08/19 1534      Check-In   Supervising physician immediately available to respond to emergencies See telemetry face sheet for immediately available ER MD    Location [facility]-Cardiac & Pulmonary Rehab    Staff Present Renita Papa, RN BSN;Joseph [address] [place], [place], [place], CCRP, CCET    Virtual Visit No    Medication changes reported     No    Fall or balance concerns reported    No    Warm-up and Cool-down Performed on first and last piece of equipment    Resistance Training Performed Yes    VAD Patient? No    PAD/SET Patient? No      Pain Assessment   Currently in Pain? No/denies              Social History   Tobacco Use  Smoking Status Former Smoker  Smokeless Tobacco Never Used  Tobacco Comment   quit 10 years    Goals Met:  Independence with exercise equipment Exercise tolerated well No report of cardiac concerns or symptoms Strength training completed today  Goals Unmet:  Not Applicable  Comments: Pt able to follow exercise prescription today without complaint.  Will continue to monitor for progression.    Dr. [name] is Medical Director for [place] and [facility].

## 2019-12-12 ENCOUNTER — Other Ambulatory Visit: Payer: Self-pay

## 2019-12-12 ENCOUNTER — Encounter: Payer: PPO | Admitting: *Deleted

## 2019-12-12 DIAGNOSIS — J449 Chronic obstructive pulmonary disease, unspecified: Secondary | ICD-10-CM

## 2019-12-12 NOTE — Progress Notes (Signed)
Daily Session Note  Patient Details  Name: Abigail Hill MRN: [id number] Date of Birth: 06-28-1950 Referring Provider:     Pulmonary Rehab from 11/01/2019 in [facility] Cardiac and Pulmonary Rehab  Referring Provider Abigail Hill      Encounter Date: 12/12/2019  Check In:  Session Check In - 12/12/19 1541      Check-In   Supervising physician immediately available to respond to emergencies See telemetry face sheet for immediately available ER MD    Location [facility]-Cardiac & Pulmonary Rehab    Staff Present Renita Papa, RN Margurite Auerbach, MS Exercise Physiologist;Kelly Amedeo Plenty, BS, ACSM CEP, Exercise Physiologist    Virtual Visit No    Medication changes reported     No    Fall or balance concerns reported    No    Warm-up and Cool-down Performed on first and last piece of equipment    Resistance Training Performed Yes    VAD Patient? No    PAD/SET Patient? No      Pain Assessment   Currently in Pain? No/denies              Social History   Tobacco Use  Smoking Status Former Smoker  Smokeless Tobacco Never Used  Tobacco Comment   quit 10 years    Goals Met:  Independence with exercise equipment Exercise tolerated well No report of cardiac concerns or symptoms Strength training completed today  Goals Unmet:  Not Applicable  Comments: Pt able to follow exercise prescription today without complaint.  Will continue to monitor for progression.    Dr. [name] is Medical Director for [place] and [facility].

## 2019-12-14 ENCOUNTER — Other Ambulatory Visit: Payer: Self-pay

## 2019-12-14 ENCOUNTER — Encounter: Payer: PPO | Admitting: *Deleted

## 2019-12-14 DIAGNOSIS — J449 Chronic obstructive pulmonary disease, unspecified: Secondary | ICD-10-CM

## 2019-12-14 NOTE — Progress Notes (Signed)
Daily Session Note  Patient Details  Name: Abigail Hill MRN: [id number] Date of Birth: 07/11/1950 Referring Provider:     Pulmonary Rehab from 11/01/2019 in [facility] Cardiac and Pulmonary Rehab  Referring Provider [name]      Encounter Date: 12/14/2019  Check In:  Session Check In - 12/14/19 1547      Check-In   Supervising physician immediately available to respond to emergencies See telemetry face sheet for immediately available ER MD    Location [facility]-Cardiac & Pulmonary Rehab    Staff Present Renita Papa, RN Margurite Auerbach, MS Exercise Physiologist;Joseph Foy [place], [place], ACSM CEP, Exercise Physiologist    Virtual Visit No    Medication changes reported     No    Fall or balance concerns reported    No    Warm-up and Cool-down Performed on first and last piece of equipment    Resistance Training Performed Yes    VAD Patient? No    PAD/SET Patient? No      Pain Assessment   Currently in Pain? No/denies              Social History   Tobacco Use  Smoking Status Former Smoker  Smokeless Tobacco Never Used  Tobacco Comment   quit 10 years    Goals Met:  Independence with exercise equipment Exercise tolerated well No report of cardiac concerns or symptoms Strength training completed today  Goals Unmet:  Not Applicable  Comments: Pt able to follow exercise prescription today without complaint.  Will continue to monitor for progression.    Dr. [name] is Medical Director for [place] and [facility].

## 2019-12-15 ENCOUNTER — Encounter: Payer: PPO | Admitting: *Deleted

## 2019-12-15 ENCOUNTER — Other Ambulatory Visit: Payer: Self-pay

## 2019-12-15 DIAGNOSIS — J449 Chronic obstructive pulmonary disease, unspecified: Secondary | ICD-10-CM | POA: Diagnosis not present

## 2019-12-15 NOTE — Progress Notes (Signed)
Daily Session Note  Patient Details  Name: JAYLENNE HAMELIN MRN: [id number] Date of Birth: Jul 24, 1950 Referring Provider:     Pulmonary Rehab from 11/01/2019 in [facility] Cardiac and Pulmonary Rehab  Referring Provider [name]      Encounter Date: 12/15/2019  Check In:  Session Check In - 12/15/19 1534      Check-In   Supervising physician immediately available to respond to emergencies See telemetry face sheet for immediately available ER MD    Location [facility]-Cardiac & Pulmonary Rehab    Staff Present Renita Papa, RN BSN;Joseph [address] [place], [place], [place], CCRP, CCET    Virtual Visit No    Medication changes reported     No    Fall or balance concerns reported    No    Warm-up and Cool-down Performed on first and last piece of equipment    Resistance Training Performed Yes    VAD Patient? No    PAD/SET Patient? No      Pain Assessment   Currently in Pain? No/denies              Social History   Tobacco Use  Smoking Status Former Smoker  Smokeless Tobacco Never Used  Tobacco Comment   quit 10 years    Goals Met:  Independence with exercise equipment Exercise tolerated well No report of cardiac concerns or symptoms Strength training completed today  Goals Unmet:  Not Applicable  Comments: Pt able to follow exercise prescription today without complaint.  Will continue to monitor for progression.    Dr. [name] is Medical Director for [place] and [facility].

## 2019-12-19 ENCOUNTER — Encounter: Payer: PPO | Admitting: *Deleted

## 2019-12-19 ENCOUNTER — Other Ambulatory Visit: Payer: Self-pay

## 2019-12-19 DIAGNOSIS — J449 Chronic obstructive pulmonary disease, unspecified: Secondary | ICD-10-CM | POA: Diagnosis not present

## 2019-12-19 NOTE — Progress Notes (Signed)
Daily Session Note  Patient Details  Name: Abigail Hill MRN: [id number] Date of Birth: Oct 09, 1950 Referring Provider:     Pulmonary Rehab from 11/01/2019 in [facility] Cardiac and Pulmonary Rehab  Referring Provider [name]      Encounter Date: 12/19/2019  Check In:  Session Check In - 12/19/19 [place]      Check-In   Supervising physician immediately available to respond to emergencies See telemetry face sheet for immediately available ER MD    Location [facility]-Cardiac & Pulmonary Rehab    Staff Present Renita Papa, RN Margurite Auerbach, MS Exercise Physiologist;Kelly Amedeo Plenty, BS, ACSM CEP, Exercise Physiologist;Other   Birdie Sons, RN   Virtual Visit No    Medication changes reported     No    Fall or balance concerns reported    No    Warm-up and Cool-down Performed on first and last piece of equipment    Resistance Training Performed Yes    VAD Patient? No    PAD/SET Patient? No      Pain Assessment   Currently in Pain? No/denies              Social History   Tobacco Use  Smoking Status Former Smoker  Smokeless Tobacco Never Used  Tobacco Comment   quit 10 years    Goals Met:  Independence with exercise equipment Exercise tolerated well No report of cardiac concerns or symptoms Strength training completed today  Goals Unmet:  Not Applicable  Comments: Pt able to follow exercise prescription today without complaint.  Will continue to monitor for progression.    Dr. Emily Hill is Medical Director for [place] and [facility].

## 2019-12-21 ENCOUNTER — Other Ambulatory Visit: Payer: Self-pay

## 2019-12-21 DIAGNOSIS — J449 Chronic obstructive pulmonary disease, unspecified: Secondary | ICD-10-CM

## 2019-12-21 NOTE — Progress Notes (Signed)
Daily Session Note  Patient Details  Name: Abigail Hill MRN: [id number] Date of Birth: 1950-06-23 Referring Provider:     Pulmonary Rehab from 11/01/2019 in [facility] Cardiac and Pulmonary Rehab  Referring Provider [name]      Encounter Date: 12/21/2019  Check In:  Session Check In - 12/21/19 1552      Check-In   Supervising physician immediately available to respond to emergencies See telemetry face sheet for immediately available ER MD    Location [facility]-Cardiac & Pulmonary Rehab    Staff Present Coralie Keens, MS Exercise Physiologist;Joseph Alcus Dad, RN Sherryl Barters, MPA, RN    Virtual Visit No    Medication changes reported     No    Fall or balance concerns reported    No    Warm-up and Cool-down Performed on first and last piece of equipment    Resistance Training Performed Yes    VAD Patient? No    PAD/SET Patient? No      Pain Assessment   Currently in Pain? No/denies              Social History   Tobacco Use  Smoking Status Former Smoker  Smokeless Tobacco Never Used  Tobacco Comment   quit 10 years    Goals Met:  Independence with exercise equipment Exercise tolerated well No report of cardiac concerns or symptoms Strength training completed today  Goals Unmet:  Not Applicable  Comments: Pt able to follow exercise prescription today without complaint.  Will continue to monitor for progression.   Dr. [name] is Medical Director for [place] and [facility].

## 2019-12-22 ENCOUNTER — Other Ambulatory Visit: Payer: Self-pay

## 2019-12-22 ENCOUNTER — Encounter: Payer: PPO | Admitting: *Deleted

## 2019-12-22 DIAGNOSIS — J449 Chronic obstructive pulmonary disease, unspecified: Secondary | ICD-10-CM | POA: Diagnosis not present

## 2019-12-22 NOTE — Progress Notes (Signed)
Daily Session Note  Patient Details  Name: Abigail Hill MRN: [id number] Date of Birth: 1950-09-10 Referring Provider:     Pulmonary Rehab from 11/01/2019 in [facility] Cardiac and Pulmonary Rehab  Referring Provider [name]      Encounter Date: 12/22/2019  Check In:  Session Check In - 12/22/19 1537      Check-In   Supervising physician immediately available to respond to emergencies See telemetry face sheet for immediately available ER MD    Location [facility]-Cardiac & Pulmonary Rehab    Staff Present Renita Papa, RN BSN;Joseph [address] [place], [place], [place], CCRP, CCET    Virtual Visit No    Medication changes reported     No    Fall or balance concerns reported    No    Warm-up and Cool-down Performed on first and last piece of equipment    Resistance Training Performed Yes    VAD Patient? No    PAD/SET Patient? No      Pain Assessment   Currently in Pain? No/denies              Social History   Tobacco Use  Smoking Status Former Smoker  Smokeless Tobacco Never Used  Tobacco Comment   quit 10 years    Goals Met:  Independence with exercise equipment Exercise tolerated well No report of cardiac concerns or symptoms Strength training completed today  Goals Unmet:  Not Applicable  Comments: Pt able to follow exercise prescription today without complaint.  Will continue to monitor for progression.    Dr. [name] is Medical Director for [place] and [facility].

## 2019-12-26 ENCOUNTER — Other Ambulatory Visit: Payer: Self-pay

## 2019-12-26 DIAGNOSIS — J449 Chronic obstructive pulmonary disease, unspecified: Secondary | ICD-10-CM | POA: Diagnosis not present

## 2019-12-26 NOTE — Progress Notes (Signed)
Daily Session Note  Patient Details  Name: Abigail Hill MRN: [id number] Date of Birth: 27-Jan-1951 Referring Provider:     Pulmonary Rehab from 11/01/2019 in [facility] Cardiac and Pulmonary Rehab  Referring Provider [name]      Encounter Date: 12/26/2019  Check In:  Session Check In - 12/26/19 1530      Check-In   Supervising physician immediately available to respond to emergencies See telemetry face sheet for immediately available ER MD    Location [facility]-Cardiac & Pulmonary Rehab    Staff Present Birdie Sons, MPA, Mauricia Area, BS, ACSM CEP, Exercise Physiologist;Meredith Sherryll Burger, RN Margurite Auerbach, MS Exercise Physiologist    Virtual Visit No    Medication changes reported     No    Fall or balance concerns reported    No    Warm-up and Cool-down Performed on first and last piece of equipment    Resistance Training Performed Yes    VAD Patient? No    PAD/SET Patient? No      Pain Assessment   Currently in Pain? No/denies              Social History   Tobacco Use  Smoking Status Former Smoker  Smokeless Tobacco Never Used  Tobacco Comment   quit 10 years    Goals Met:  Independence with exercise equipment Exercise tolerated well No report of cardiac concerns or symptoms Strength training completed today  Goals Unmet:  Not Applicable  Comments: Pt able to follow exercise prescription today without complaint.  Will continue to monitor for progression.   Dr. [name] is Medical Director for [place] and [facility].

## 2019-12-28 ENCOUNTER — Other Ambulatory Visit: Payer: Self-pay

## 2019-12-28 DIAGNOSIS — J449 Chronic obstructive pulmonary disease, unspecified: Secondary | ICD-10-CM | POA: Diagnosis not present

## 2019-12-28 NOTE — Progress Notes (Signed)
Daily Session Note  Patient Details  Name: Abigail Hill MRN: [id number] Date of Birth: 1950-08-29 Referring Provider:     Pulmonary Rehab from 11/01/2019 in [facility] Cardiac and Pulmonary Rehab  Referring Provider [name]      Encounter Date: 12/28/2019  Check In:  Session Check In - 12/28/19 1538      Check-In   Supervising physician immediately available to respond to emergencies See telemetry face sheet for immediately available ER MD    Location [facility]-Cardiac & Pulmonary Rehab    Staff Present Birdie Sons, MPA, RN;Joseph Lou [place], [place] Exercise Physiologist    Virtual Visit No    Medication changes reported     No    Fall or balance concerns reported    No    Warm-up and Cool-down Performed on first and last piece of equipment    Resistance Training Performed Yes    VAD Patient? No    PAD/SET Patient? No      Pain Assessment   Currently in Pain? No/denies              Social History   Tobacco Use  Smoking Status Former Smoker  Smokeless Tobacco Never Used  Tobacco Comment   quit 10 years    Goals Met:  Independence with exercise equipment Exercise tolerated well No report of cardiac concerns or symptoms Strength training completed today  Goals Unmet:  Not Applicable  Comments: Pt able to follow exercise prescription today without complaint.  Will continue to monitor for progression.    Dr. [name] is Medical Director for [place] and [facility].

## 2019-12-29 ENCOUNTER — Other Ambulatory Visit: Payer: Self-pay

## 2019-12-29 ENCOUNTER — Encounter: Payer: PPO | Admitting: *Deleted

## 2019-12-29 DIAGNOSIS — J449 Chronic obstructive pulmonary disease, unspecified: Secondary | ICD-10-CM

## 2019-12-29 NOTE — Progress Notes (Signed)
Daily Session Note  Patient Details  Name: JANALYN HIGBY MRN: [id number] Date of Birth: [birth year] Referring Provider:     Pulmonary Rehab from 11/01/2019 in [facility] Cardiac and Pulmonary Rehab  Referring Provider [name]      Encounter Date: 12/29/2019  Check In:  Session Check In - 12/29/19 [place]      Check-In   Supervising physician immediately available to respond to emergencies See telemetry face sheet for immediately available ER MD    Location [facility]-Cardiac & Pulmonary Rehab    Staff Present Renita Papa, RN BSN;Joseph [address] [place], [place], [place], CCRP, CCET    Virtual Visit No    Medication changes reported     No    Fall or balance concerns reported    No    Warm-up and Cool-down Performed on first and last piece of equipment    Resistance Training Performed Yes    VAD Patient? No    PAD/SET Patient? No      Pain Assessment   Currently in Pain? No/denies              Social History   Tobacco Use  Smoking Status Former Smoker  Smokeless Tobacco Never Used  Tobacco Comment   quit 10 years    Goals Met:  Independence with exercise equipment Exercise tolerated well No report of cardiac concerns or symptoms Strength training completed today  Goals Unmet:  Not Applicable  Comments: Pt able to follow exercise prescription today without complaint.  Will continue to monitor for progression.    Dr. [name] is Medical Director for [place] and [facility].

## 2020-01-02 ENCOUNTER — Encounter: Payer: PPO | Attending: Pulmonary Disease

## 2020-01-02 ENCOUNTER — Other Ambulatory Visit: Payer: Self-pay

## 2020-01-02 DIAGNOSIS — J449 Chronic obstructive pulmonary disease, unspecified: Secondary | ICD-10-CM

## 2020-01-02 NOTE — Progress Notes (Signed)
Daily Session Note  Patient Details  Name: Abigail Hill MRN: [id number] Date of Birth: [birth year] Referring Provider:     Pulmonary Rehab from 11/01/2019 in [facility] Cardiac and Pulmonary Rehab  Referring Provider [name]      Encounter Date: 01/02/2020  Check In:  Session Check In - 01/02/20 1540      Check-In   Supervising physician immediately available to respond to emergencies See telemetry face sheet for immediately available ER MD    Location [facility]-Cardiac & Pulmonary Rehab    Staff Present Birdie Sons, MPA, Mauricia Area, BS, ACSM CEP, Exercise Physiologist;Kara Eliezer Bottom, MS Exercise Physiologist    Virtual Visit No    Medication changes reported     No    Fall or balance concerns reported    No    Warm-up and Cool-down Performed on first and last piece of equipment    Resistance Training Performed Yes    VAD Patient? No    PAD/SET Patient? No      Pain Assessment   Currently in Pain? No/denies              Social History   Tobacco Use  Smoking Status Former Smoker  Smokeless Tobacco Never Used  Tobacco Comment   quit 10 years    Goals Met:  Independence with exercise equipment Exercise tolerated well No report of cardiac concerns or symptoms Strength training completed today  Goals Unmet:  Not Applicable  Comments: Pt able to follow exercise prescription today without complaint.  Will continue to monitor for progression.    Dr. [name] is Medical Director for [place] and [facility].

## 2020-01-04 ENCOUNTER — Encounter: Payer: Self-pay | Admitting: *Deleted

## 2020-01-04 ENCOUNTER — Other Ambulatory Visit: Payer: Self-pay

## 2020-01-04 DIAGNOSIS — J449 Chronic obstructive pulmonary disease, unspecified: Secondary | ICD-10-CM | POA: Diagnosis not present

## 2020-01-04 NOTE — Progress Notes (Signed)
Pulmonary Individual Treatment Plan  Patient Details  Name: Abigail Hill MRN: [id number] Date of Birth: 24-Jan-1951 Referring Provider:     Pulmonary Rehab from 11/01/2019 in [facility] Cardiac and Pulmonary Rehab  Referring Provider Aleskerov      Initial Encounter Date:    Pulmonary Rehab from 11/01/2019 in [facility] Cardiac and Pulmonary Rehab  Date 11/01/19      Visit Diagnosis: Chronic obstructive pulmonary disease, unspecified COPD type ([place])  Patient's Home Medications on Admission:  Current Outpatient Medications:  .  albuterol (PROVENTIL) (2.5 MG/3ML) 0.083% nebulizer solution, Take 3 mLs (2.5 mg total) by nebulization every 6 (six) hours as needed for wheezing or shortness of breath., Disp: 360 mL, Rfl: 0 .  albuterol (VENTOLIN HFA) 108 (90 Base) MCG/ACT inhaler, Inhale 2 puffs into the lungs every 6 (six) hours as needed for wheezing or shortness of breath. (Patient not taking: Reported on 09/07/2019), Disp: 8 g, Rfl: 0 .  alendronate (FOSAMAX) 70 MG tablet, Take 70 mg by mouth every Monday.  (Patient not taking: Reported on 09/07/2019), Disp: , Rfl:  .  aspirin 81 MG chewable tablet, Chew 81 mg by mouth daily., Disp: , Rfl:  .  Calcium Carb-Cholecalciferol (OYSTER SHELL CALCIUM) 500-400 MG-UNIT TABS, Take 1 tablet by mouth daily.  (Patient not taking: Reported on 10/28/2019), Disp: , Rfl:  .  furosemide (LASIX) 20 MG tablet, Take 1 tablet (20 mg total) by mouth 2 (two) times daily. (Patient not taking: Reported on 10/28/2019), Disp: 60 tablet, Rfl: 0 .  ketoconazole (NIZORAL) 2 % shampoo, Apply topically 3 (three) times a week. (Patient not taking: Reported on 10/28/2019), Disp: , Rfl:  .  mirabegron ER (MYRBETRIQ) 50 MG TB24 tablet, Take 1 tablet (50 mg total) by mouth daily., Disp: 30 tablet, Rfl: 11 .  Multiple Vitamins-Minerals (CENTRUM SILVER PO), Take 1 tablet by mouth daily.  (Patient not taking: Reported on 10/28/2019), Disp: , Rfl:  .  nystatin-triamcinolone ointment (MYCOLOG), Apply 1  application topically 2 (two) times daily. (Patient not taking: Reported on 10/28/2019), Disp: 30 g, Rfl: 0 .  torsemide (DEMADEX) 20 MG tablet, Take 20 mg by mouth daily. (Patient not taking: Reported on 10/28/2019), Disp: , Rfl:  .  umeclidinium-vilanterol (ANORO ELLIPTA) 62.5-25 MCG/INH AEPB, Inhale 1 puff into the lungs daily. (Patient not taking: Reported on 09/07/2019), Disp: 60 each, Rfl: 0  Past Medical History: Past Medical History:  Diagnosis Date  . Abnormal gait 12/18/2015  . BP (high blood pressure) 08/24/2015  . Cancer ([place])    skin  . Constriction of ureter (postoperative) 07/28/2015  . Hydronephrosis 07/14/2015  . Hypertension   . Left foot drop 12/18/2015  . Recurrent UTI   . UPJ (ureteropelvic junction) obstruction 07/28/2015  . UTI (lower urinary tract infection) 07/14/2015    Tobacco Use: Social History   Tobacco Use  Smoking Status Former Smoker  Smokeless Tobacco Never Used  Tobacco Comment   quit 10 years    Labs: Recent Review Scientist, physiological    Labs for ITP Cardiac and Pulmonary Rehab Latest Ref Rng & Units 05/16/2019   PHART 7.35 - 7.45 7.38   PCO2ART 32 - 48 mmHg 88(HH)   HCO3 20.0 - 28.0 mmol/L 52.1(H)   O2SAT % 92.6       Pulmonary Assessment Scores:  Pulmonary Assessment Scores    Row Name 11/01/19 1431 11/02/19 1624       ADL UCSD   SOB Score total 54 --    Rest 0 --  Walk 3 --    Bath 0 --    Dress 1 --    Shop 5 --      CAT Score   CAT Score 18 --      mMRC Score   mMRC Score -- 2           UCSD: Self-administered rating of dyspnea associated with activities of daily living (ADLs) 6-point scale (0 = "not at all" to 5 = "maximal or unable to do because of breathlessness")  Scoring Scores range from 0 to 120.  Minimally important difference is 5 units  CAT: CAT can identify the health impairment of COPD patients and is better correlated with disease progression.  CAT has a scoring range of zero to 40. The CAT score is  classified into four groups of low (less than 10), medium (10 - 20), high (21-30) and very high (31-40) based on the impact level of disease on health status. A CAT score over 10 suggests significant symptoms.  A worsening CAT score could be explained by an exacerbation, poor medication adherence, poor inhaler technique, or progression of COPD or comorbid conditions.  CAT MCID is 2 points  mMRC: mMRC (Modified Medical Research Council) Dyspnea Scale is used to assess the degree of baseline functional disability in patients of respiratory disease due to dyspnea. No minimal important difference is established. A decrease in score of 1 point or greater is considered a positive change.   Pulmonary Function Assessment:  Pulmonary Function Assessment - 11/01/19 1430      Pulmonary Function Tests   FVC% 76 %    FEV1% 45 %    FEV1/FVC Ratio 47    RV% 77 %    DLCO% 61 %           Exercise Target Goals: Exercise Program Goal: Individual exercise prescription set using results from initial 6 min walk test and THRR while considering  patient's activity barriers and safety.   Exercise Prescription Goal: Initial exercise prescription builds to 30-45 minutes a day of aerobic activity, 2-3 days per week.  Home exercise guidelines will be given to patient during program as part of exercise prescription that the participant will acknowledge.  Education: Aerobic Exercise & Resistance Training: - Gives group verbal and written instruction on the various components of exercise. Focuses on aerobic and resistive training programs and the benefits of this training and how to safely progress through these programs..   Pulmonary Rehab from 12/28/2019 in [facility] Cardiac and Pulmonary Rehab  Date 12/28/19  Hilda Blades only on 10/27]  Educator [facility]  Instruction Review Code 1- [employer] Understanding      Education: Exercise & Equipment Safety: - Individual verbal instruction and demonstration of equipment use  and safety with use of the equipment.   Pulmonary Rehab from 12/28/2019 in [facility] Cardiac and Pulmonary Rehab  Date 11/01/19  Educator AS  Instruction Review Code 1- Verbalizes Understanding      Education: Exercise Physiology & General Exercise Guidelines: - Group verbal and written instruction with models to review the exercise physiology of the cardiovascular system and associated critical values. Provides general exercise guidelines with specific guidelines to those with heart or lung disease.    Pulmonary Rehab from 12/28/2019 in [facility] Cardiac and Pulmonary Rehab  Date 12/14/19  Educator AS  Instruction Review Code 1- Verbalizes Understanding      Education: Flexibility, Balance, Mind/Body Relaxation: Provides group verbal/written instruction on the benefits of flexibility and balance training, including mind/body exercise modes  such as yoga, pilates and tai chi.  Demonstration and skill practice provided.   Pulmonary Rehab from 12/28/2019 in [facility] Cardiac and Pulmonary Rehab  Date 11/02/19  Educator AS  Instruction Review Code 1- Verbalizes Understanding      Activity Barriers & Risk Stratification:  Activity Barriers & Cardiac Risk Stratification - 10/28/19 1415      Activity Barriers & Cardiac Risk Stratification   Activity Barriers Balance Concerns;Assistive Device;Other (comment);Muscular Weakness    Comments broken collar bone easter 2020- no restrictions           6 Minute Walk:  6 Minute Walk    Row Name 11/01/19 1434 11/02/19 1623       6 Minute Walk   Phase Initial --    Distance 195 feet --    Walk Time 6 minutes --    # of Rest Breaks 0 --    MPH 0.37 --    METS 0.9 --    RPE -- 13    Perceived Dyspnea  -- 1    VO2 Peak 3.11 --    Symptoms No --    Resting HR 87 bpm --    Resting BP 122/66 --    Resting Oxygen Saturation  94 % --    Exercise Oxygen Saturation  during 6 min walk 85 % --    Max Ex. HR 112 bpm --    Max Ex. BP 140/62 --    2 Minute  Post BP 130/64 --      Interval HR   1 Minute HR 101 --    2 Minute HR 100 --    3 Minute HR 108 --    4 Minute HR 110 --    5 Minute HR 110 --    6 Minute HR 112 --    2 Minute Post HR 96 --    Interval Heart Rate? Yes --      Interval Oxygen   Interval Oxygen? Yes --    Baseline Oxygen Saturation % 94 % --    1 Minute Oxygen Saturation % 90 % --    1 Minute Liters of Oxygen 0 L --    2 Minute Oxygen Saturation % 88 % --    2 Minute Liters of Oxygen 0 L --    3 Minute Oxygen Saturation % 87 % --    3 Minute Liters of Oxygen 0 L --    4 Minute Oxygen Saturation % 85 % --    4 Minute Liters of Oxygen 0 L --    5 Minute Oxygen Saturation % 87 % --    5 Minute Liters of Oxygen 0 L --    6 Minute Oxygen Saturation % 86 % --    6 Minute Liters of Oxygen 0 L --    2 Minute Post Oxygen Saturation % 90 % --    2 Minute Post Liters of Oxygen 0 L --          Oxygen Initial Assessment:  Oxygen Initial Assessment - 10/28/19 1410      Home Oxygen   Home Oxygen Device Portable Concentrator    Sleep Oxygen Prescription Continuous    Liters per minute 2    Home Exercise Oxygen Prescription Continuous   O2 is usually above 94% during the day without any oxygen, wears pulse ox and monitors during the day   Liters per minute 2    Home Resting Oxygen Prescription Continuous  Liters per minute 2    Compliance with Home Oxygen Use Yes   only wears if O2 sat drops     Intervention   Short Term Goals To learn and exhibit compliance with exercise, home and travel O2 prescription;To learn and understand importance of monitoring SPO2 with pulse oximeter and demonstrate accurate use of the pulse oximeter.;To learn and understand importance of maintaining oxygen saturations>88%;To learn and demonstrate proper pursed lip breathing techniques or other breathing techniques.    Long  Term Goals Exhibits compliance with exercise, home and travel O2 prescription;Maintenance of O2  saturations>88%;Verbalizes importance of monitoring SPO2 with pulse oximeter and return demonstration;Exhibits proper breathing techniques, such as pursed lip breathing or other method taught during program session           Oxygen Re-Evaluation:  Oxygen Re-Evaluation    Row Name 11/17/19 1550 12/15/19 1546           Program Oxygen Prescription   Program Oxygen Prescription None None      Liters per minute 0 0        Home Oxygen   Home Oxygen Device Home Concentrator;Portable Concentrator Home Concentrator      Sleep Oxygen Prescription Continuous Continuous      Liters per minute 2 2      Home Exercise Oxygen Prescription None None      Liters per minute 0 --      Home Resting Oxygen Prescription None None      Liters per minute 0 --      Compliance with Home Oxygen Use Yes Yes        Goals/Expected Outcomes   Short Term Goals To learn and understand importance of monitoring SPO2 with pulse oximeter and demonstrate accurate use of the pulse oximeter.;To learn and exhibit compliance with exercise, home and travel O2 prescription;To learn and understand importance of maintaining oxygen saturations>88%;To learn and demonstrate proper pursed lip breathing techniques or other breathing techniques.;To learn and demonstrate proper use of respiratory medications To learn and understand importance of monitoring SPO2 with pulse oximeter and demonstrate accurate use of the pulse oximeter.;To learn and understand importance of maintaining oxygen saturations>88%      Long  Term Goals Exhibits compliance with exercise, home and travel O2 prescription;Verbalizes importance of monitoring SPO2 with pulse oximeter and return demonstration;Maintenance of O2 saturations>88%;Exhibits proper breathing techniques, such as pursed lip breathing or other method taught during program session;Compliance with respiratory medication;Demonstrates proper use of MDI's Verbalizes importance of monitoring SPO2 with  pulse oximeter and return demonstration;Maintenance of O2 saturations>88%      Comments Informed patient how to perform the Pursed Lipped breathing technique. Told patient to Inhale through the nose and out the mouth with pursed lips to keep their airways open, help oxygenate them better, practice when at rest or doing strenuous activity. Patient Verbalizes understanding of technique and will work on and be reiterated during [place]. Lorana has does not take any respiratory medications. She checks her oxygen at home and only wears oxygen at night. She knows that her oxygen level should be 88 percent and above.      Goals/Expected Outcomes Short: use PLB with exertion. Long: use PLB on exertion proficiently and independently. Short: continue [facility] and inform staff of any questions regarding her respiratory status. Long: Use all techniques and knowledge learned independently             Oxygen Discharge (Final Oxygen Re-Evaluation):  Oxygen Re-Evaluation - 12/15/19 1546  Program Oxygen Prescription   Program Oxygen Prescription None    Liters per minute 0      Home Oxygen   Home Oxygen Device Home Concentrator    Sleep Oxygen Prescription Continuous    Liters per minute 2    Home Exercise Oxygen Prescription None    Home Resting Oxygen Prescription None    Compliance with Home Oxygen Use Yes      Goals/Expected Outcomes   Short Term Goals To learn and understand importance of monitoring SPO2 with pulse oximeter and demonstrate accurate use of the pulse oximeter.;To learn and understand importance of maintaining oxygen saturations>88%    Long  Term Goals Verbalizes importance of monitoring SPO2 with pulse oximeter and return demonstration;Maintenance of O2 saturations>88%    Comments Joia has does not take any respiratory medications. She checks her oxygen at home and only wears oxygen at night. She knows that her oxygen level should be 88 percent and above.    Goals/Expected  Outcomes Short: continue [facility] and inform staff of any questions regarding her respiratory status. Long: Use all techniques and knowledge learned independently           Initial Exercise Prescription:  Initial Exercise Prescription - 11/01/19 1400      Date of Initial Exercise RX and Referring Provider   Date 11/01/19    Referring Provider Aleskerov      Treadmill   MPH 0.4    Grade 0    Minutes 15    METs 1      Recumbant Bike   Level 1    RPM 60    Minutes 15    METs 1      NuStep   Level 1    SPM 80    Minutes 15    METs 1      Prescription Details   Frequency (times per week) 3    Duration Progress to 30 minutes of continuous aerobic without signs/symptoms of physical distress      Intensity   THRR 40-80% of Max Heartrate 111-137    Ratings of Perceived Exertion 11-15    Perceived Dyspnea 0-4      Resistance Training   Training Prescription Yes    Weight 2 lb    Reps 10-15           Perform Capillary Blood Glucose checks as needed.  Exercise Prescription Changes:  Exercise Prescription Changes    Row Name 11/01/19 1400 11/02/19 1600 11/15/19 0700 11/28/19 1100 12/07/19 1600     Response to Exercise   Blood Pressure (Admit) 122/66 -- 136/76 126/80 --   Blood Pressure (Exercise) 140/62 -- 140/74 142/80 --   Blood Pressure (Exit) 130/64 -- 122/70 132/64 --   Heart Rate (Admit) 87 bpm -- 88 bpm 88 bpm --   Heart Rate (Exercise) 112 bpm -- 98 bpm 93 bpm --   Heart Rate (Exit) 96 bpm -- 84 bpm 79 bpm --   Oxygen Saturation (Admit) 94 % -- 95 % 94 % --   Oxygen Saturation (Exercise) 85 % -- 92 % 93 % --   Oxygen Saturation (Exit) 90 % -- 95 % 94 % --   Rating of Perceived Exertion (Exercise) -- $[id number]'13 15 12 '[id number]$ --   Perceived Dyspnea (Exercise) -- 1 1 0 --   Symptoms none -- none none --   Comments walk test -- -- -- --   Duration -- -- Progress to 30 minutes of  aerobic without signs/symptoms  of physical distress Progress to 30 minutes of  aerobic  without signs/symptoms of physical distress --   Intensity -- -- THRR unchanged THRR unchanged --     Progression   Progression -- -- Continue to progress workloads to maintain intensity without signs/symptoms of physical distress. Continue to progress workloads to maintain intensity without signs/symptoms of physical distress. --   Average METs -- -- 1.95 2 --     Resistance Training   Training Prescription -- -- Yes Yes --   Weight -- -- 2 lb 2 lb --   Reps -- -- 10-15 10-15 --     Interval Training   Interval Training -- -- No No --     Treadmill   MPH -- -- 0.4 -- --   Grade -- -- 0 -- --   Minutes -- -- 5 -- --   METs -- -- 1.3 -- --     Recumbant Bike   Level -- -- 1 1 --   Minutes -- -- 15 15 --   METs -- -- 2.3 2.4 --     NuStep   Level -- -- 1 1 --   SPM -- -- -- 80 --   Minutes -- -- 15 15 --   METs -- -- 2.1 1.6 --     Home Exercise Plan   Plans to continue exercise at -- -- -- -- Home (comment)  Recumbant bike   Frequency -- -- -- -- Add 2 additional days to program exercise sessions.   Initial Home Exercises Provided -- -- -- -- 12/07/19   Row Name 12/12/19 1600 12/28/19 1200           Response to Exercise   Blood Pressure (Admit) 146/70 128/82      Blood Pressure (Exercise) 150/74 132/80      Blood Pressure (Exit) 140/80 130/70      Heart Rate (Admit) 96 bpm 88 bpm      Heart Rate (Exercise) 94 bpm 95 bpm      Heart Rate (Exit) 80 bpm 89 bpm      Oxygen Saturation (Admit) 90 % 93 %      Oxygen Saturation (Exercise) 90 % 90 %      Oxygen Saturation (Exit) 94 % 93 %      Rating of Perceived Exertion (Exercise) 13 11      Perceived Dyspnea (Exercise) 0 0      Symptoms none none      Duration Continue with 30 min of aerobic exercise without signs/symptoms of physical distress. Continue with 30 min of aerobic exercise without signs/symptoms of physical distress.      Intensity THRR unchanged THRR unchanged        Progression   Progression Continue to  progress workloads to maintain intensity without signs/symptoms of physical distress. Continue to progress workloads to maintain intensity without signs/symptoms of physical distress.      Average METs 2.46 1.8        Resistance Training   Training Prescription Yes Yes      Weight 3 lb 3 lb      Reps 10-15 10-15        Interval Training   Interval Training No No        Recumbant Bike   Level 1 --      Minutes 15 --      METs 2.63 --        NuStep   Level 4 3  SPM -- 80      Minutes 15 15      METs 1.9 1.7        Home Exercise Plan   Plans to continue exercise at Home (comment)  Recumbant bike Home (comment)  Recumbant bike      Frequency Add 2 additional days to program exercise sessions. Add 2 additional days to program exercise sessions.      Initial Home Exercises Provided 12/07/19 12/07/19             Exercise Comments:   Exercise Goals and Review:  Exercise Goals    Row Name 11/01/19 1440             Exercise Goals   Increase Physical Activity Yes       Intervention Provide advice, education, support and counseling about physical activity/exercise needs.;Develop an individualized exercise prescription for aerobic and resistive training based on initial evaluation findings, risk stratification, comorbidities and participant's personal goals.       Expected Outcomes Short Term: Attend rehab on a regular basis to increase amount of physical activity.;Long Term: Add in home exercise to make exercise part of routine and to increase amount of physical activity.       Increase Strength and Stamina Yes       Intervention Provide advice, education, support and counseling about physical activity/exercise needs.;Develop an individualized exercise prescription for aerobic and resistive training based on initial evaluation findings, risk stratification, comorbidities and participant's personal goals.       Expected Outcomes Short Term: Increase workloads from initial  exercise prescription for resistance, speed, and METs.;Short Term: Perform resistance training exercises routinely during rehab and add in resistance training at home;Long Term: Improve cardiorespiratory fitness, muscular endurance and strength as measured by increased METs and functional capacity (6MWT)       Able to understand and use rate of perceived exertion (RPE) scale Yes       Intervention Provide education and explanation on how to use RPE scale       Expected Outcomes Short Term: Able to use RPE daily in rehab to express subjective intensity level;Long Term:  Able to use RPE to guide intensity level when exercising independently       Able to understand and use Dyspnea scale Yes       Intervention Provide education and explanation on how to use Dyspnea scale       Expected Outcomes Short Term: Able to use Dyspnea scale daily in rehab to express subjective sense of shortness of breath during exertion;Long Term: Able to use Dyspnea scale to guide intensity level when exercising independently       Knowledge and understanding of Target Heart Rate Range (THRR) Yes       Intervention Provide education and explanation of THRR including how the numbers were predicted and where they are located for reference       Expected Outcomes Short Term: Able to state/look up THRR;Short Term: Able to use daily as guideline for intensity in rehab;Long Term: Able to use THRR to govern intensity when exercising independently       Able to check pulse independently Yes       Intervention Provide education and demonstration on how to check pulse in carotid and radial arteries.;Review the importance of being able to check your own pulse for safety during independent exercise       Expected Outcomes Short Term: Able to explain why pulse checking is important during independent exercise;Long  Term: Able to check pulse independently and accurately       Understanding of Exercise Prescription Yes       Intervention  Provide education, explanation, and written materials on patient's individual exercise prescription       Expected Outcomes Short Term: Able to explain program exercise prescription;Long Term: Able to explain home exercise prescription to exercise independently              Exercise Goals Re-Evaluation :  Exercise Goals Re-Evaluation    Row Name 11/02/19 1529 11/15/19 0732 11/17/19 1600 11/28/19 1129 12/12/19 1559     Exercise Goal Re-Evaluation   Exercise Goals Review Increase Physical Activity;Able to understand and use rate of perceived exertion (RPE) scale;Knowledge and understanding of Target Heart Rate Range (THRR);Understanding of Exercise Prescription;Increase Strength and Stamina;Able to check pulse independently Increase Physical Activity;Increase Strength and Stamina;Understanding of Exercise Prescription Increase Physical Activity;Increase Strength and Stamina Increase Physical Activity;Increase Strength and Stamina Increase Physical Activity;Increase Strength and Stamina;Understanding of Exercise Prescription   Comments Reviewed RPE and dyspnea scales, THR and program prescription with pt today.  Pt voiced understanding and was given a copy of goals to take home. Ima is off to a good start in rehab.  She is up to 5 min on the treadmill and 10 watts on the bike.  We will continue to monitor her progress. On her off days she rides her bike at home for 15 minutes. She is improving quickly in [facility] and is able to walk better with her walker. Once she gets more strength she is going to try the treadmill. Lashawn has tried the TM and did well.  She is progressing well on other machines. Jazmyne is doing well in rehab.  She is up to level 4 on the NuStep.  We will continue to monitor her progress.   Expected Outcomes Short: Use RPE daily to regulate intensity. Long: Follow program prescription in THR. Short: Continue to attend regularly Long: Continue to follow program prescription. Short: use  the treadmill. Long: continue to gain strength and maintain an exercise routine at home Short: continue to build time on TM Long: build overall stamina Short: Continue to build stamina on treadmill Long: Continue to strengthen legs   Row Name 12/15/19 1557 12/28/19 1238           Exercise Goal Re-Evaluation   Exercise Goals Review Increase Physical Activity;Increase Strength and Stamina Increase Physical Activity;Increase Strength and Stamina      Comments Marissah is able to do more since the start of rehab. She has been able to go higher on her levels and maintain them. She wants to try the treadnmill when she is strong enough and someone can be with her. Careli attends consistently and works at [employer]-13.  Staff will work with her on walking on the TM.      Expected Outcomes Short: walk on the treadmill. Long: walk on the treadmill independently. Short: continue to attend consistently Long:  build stamina on TM             Discharge Exercise Prescription (Final Exercise Prescription Changes):  Exercise Prescription Changes - 12/28/19 1200      Response to Exercise   Blood Pressure (Admit) 128/82    Blood Pressure (Exercise) 132/80    Blood Pressure (Exit) 130/70    Heart Rate (Admit) 88 bpm    Heart Rate (Exercise) 95 bpm    Heart Rate (Exit) 89 bpm    Oxygen Saturation (Admit)  93 %    Oxygen Saturation (Exercise) 90 %    Oxygen Saturation (Exit) 93 %    Rating of Perceived Exertion (Exercise) 11    Perceived Dyspnea (Exercise) 0    Symptoms none    Duration Continue with 30 min of aerobic exercise without signs/symptoms of physical distress.    Intensity THRR unchanged      Progression   Progression Continue to progress workloads to maintain intensity without signs/symptoms of physical distress.    Average METs 1.8      Resistance Training   Training Prescription Yes    Weight 3 lb    Reps 10-15      Interval Training   Interval Training No      NuStep   Level 3    SPM  80    Minutes 15    METs 1.7      Home Exercise Plan   Plans to continue exercise at Home (comment)   Recumbant bike   Frequency Add 2 additional days to program exercise sessions.    Initial Home Exercises Provided 12/07/19           Nutrition:  Target Goals: Understanding of nutrition guidelines, daily intake of sodium '1500mg'$ , cholesterol '200mg'$ , calories 30% from fat and 7% or less from saturated fats, daily to have 5 or more servings of fruits and vegetables.  Education: Controlling Sodium/Reading Food Labels -Group verbal and written material supporting the discussion of sodium use in heart healthy nutrition. Review and explanation with models, verbal and written materials for utilization of the food label.   Education: General Nutrition Guidelines/Fats and Fiber: -Group instruction provided by verbal, written material, models and posters to present the general guidelines for heart healthy nutrition. Gives an explanation and review of dietary fats and fiber.   Biometrics:  Pre Biometrics - 11/01/19 1442      Pre Biometrics   Height 5' 1.25" (1.556 m)    Weight 158 lb 4.8 oz (71.8 kg)    BMI (Calculated) 29.66            Nutrition Therapy Plan and Nutrition Goals:  Nutrition Therapy & Goals - 11/10/19 1215      Nutrition Therapy   Diet heart healthy, low Na, Pulmonary MNT    Protein (specify units) 85g    Fiber 25 grams    Whole Grain Foods 3 servings    Saturated Fats 12 max. grams    Fruits and Vegetables 5 servings/day    Sodium 1.5 grams      Personal Nutrition Goals   Nutrition Goal ST: add in beans, try daves bread LT: Buiding muscle to increase strength and balance    Comments B: oatmeal with raisins and brown sugar or cheerios with bananas, weekend bacon or sausage and eggs. L: sandwich, fruit, and low fat chips. D: chicken, pork, or ground beefs, tacos and spaghetti. Her husband cooks for her and he doesn't like whole wheat - suggested a pt favorite  Daves killer bread and adding fiber in other ways like including beans. Reviewed heart healthy eating and pulmonary MNT.      Intervention Plan   Intervention Prescribe, educate and counsel regarding individualized specific dietary modifications aiming towards targeted core components such as weight, hypertension, lipid management, diabetes, heart failure and other comorbidities.;Nutrition handout(s) given to patient.    Expected Outcomes Short Term Goal: Understand basic principles of dietary content, such as calories, fat, sodium, cholesterol and nutrients.;Short Term Goal: A plan has been  developed with personal nutrition goals set during dietitian appointment.;Long Term Goal: Adherence to prescribed nutrition plan.           Nutrition Assessments:  Nutrition Assessments - 11/01/19 1433      MEDFICTS Scores   Pre Score 22           MEDIFICTS Score Key:          ?70 Need to make dietary changes          40-70 Heart Healthy Diet         ? 40 Therapeutic Level Cholesterol Diet  Nutrition Goals Re-Evaluation:  Nutrition Goals Re-Evaluation    [place] Name 12/26/19 1540             Goals   Nutrition Goal ST: try bean recipes (beans 3x/week) LT: Buiding muscle to increase strength and balance       Comment Halayna reports her and her husband love the whole wheat bread. She reports having pinto beans and trying black beans once - try some easy bean recipes. She reports no other changes at this time.       Expected Outcome ST: try bean recipes (beans 3x/week) LT: Buiding muscle to increase strength and balance              Nutrition Goals Discharge (Final Nutrition Goals Re-Evaluation):  Nutrition Goals Re-Evaluation - 12/26/19 1540      Goals   Nutrition Goal ST: try bean recipes (beans 3x/week) LT: Buiding muscle to increase strength and balance    Comment Shanzay reports her and her husband love the whole wheat bread. She reports having pinto beans and trying black beans once -  try some easy bean recipes. She reports no other changes at this time.    Expected Outcome ST: try bean recipes (beans 3x/week) LT: Buiding muscle to increase strength and balance           Psychosocial: Target Goals: Acknowledge presence or absence of significant depression and/or stress, maximize coping skills, provide positive support system. Participant is able to verbalize types and ability to use techniques and skills needed for reducing stress and depression.   Education: Depression - Provides group verbal and written instruction on the correlation between heart/lung disease and depressed mood, treatment options, and the stigmas associated with seeking treatment.   Pulmonary Rehab from 12/28/2019 in [facility] Cardiac and Pulmonary Rehab  Date 12/07/19  Educator [facility]  Instruction Review Code 1- [employer] Understanding      Education: Sleep Hygiene -Provides group verbal and written instruction about how sleep can affect your health.  Define sleep hygiene, discuss sleep cycles and impact of sleep habits. Review good sleep hygiene tips.    Education: Stress and Anxiety: - Provides group verbal and written instruction about the health risks of elevated stress and causes of high stress.  Discuss the correlation between heart/lung disease and anxiety and treatment options. Review healthy ways to manage with stress and anxiety.   Pulmonary Rehab from 12/28/2019 in [facility] Cardiac and Pulmonary Rehab  Date 12/07/19  Educator [facility]  Instruction Review Code 1- Verbalizes Understanding      Initial Review & Psychosocial Screening:  Initial Psych Review & Screening - 10/28/19 1417      Initial Review   Current issues with Current Stress Concerns    Source of Stress Concerns Chronic Illness;Unable to perform yard/household activities;Unable to participate in former interests or hobbies      [place]? Yes  husband; children and grandchildren     Barriers    Psychosocial barriers to participate in program There are no identifiable barriers or psychosocial needs.;The patient should benefit from training in stress management and relaxation.      Screening Interventions   Interventions Encouraged to exercise;To provide support and resources with identified psychosocial needs;Provide feedback about the scores to participant    Expected Outcomes Short Term goal: Utilizing psychosocial counselor, staff and physician to assist with identification of specific Stressors or current issues interfering with healing process. Setting desired goal for each stressor or current issue identified.;Long Term Goal: Stressors or current issues are controlled or eliminated.;Short Term goal: Identification and review with participant of any Quality of Life or Depression concerns found by scoring the questionnaire.;Long Term goal: The participant improves quality of Life and PHQ9 Scores as seen by post scores and/or verbalization of changes           Quality of Life Scores:  Scores of 19 and below usually indicate a poorer quality of life in these areas.  A difference of  2-3 points is a clinically meaningful difference.  A difference of 2-3 points in the total score of the Quality of Life Index has been associated with significant improvement in overall quality of life, self-image, physical symptoms, and general health in studies assessing change in quality of life.  PHQ-9: Recent Review Flowsheet Data    Depression screen [facility] 2/9 11/01/2019 10/28/2016   Decreased Interest - 0   Down, Depressed, Hopeless 0 0   PHQ - 2 Score 0 0   Altered sleeping 0 -   Tired, decreased energy 0 -   Change in appetite 0 -   Feeling bad or failure about yourself  0 -   Trouble concentrating 0 -   Moving slowly or fidgety/restless 2  -   Suicidal thoughts 0 -   PHQ-9 Score 2 -   Difficult doing work/chores Somewhat difficult -     Interpretation of Total Score  Total Score Depression  Severity:  1-4 = Minimal depression, 5-9 = Mild depression, 10-14 = Moderate depression, 15-19 = Moderately severe depression, 20-27 = Severe depression   Psychosocial Evaluation and Intervention:  Psychosocial Evaluation - 10/28/19 1426      Psychosocial Evaluation & Interventions   Interventions Encouraged to exercise with the program and follow exercise prescription    Comments Timberlee has had a decline in her health more so this last year and was surprised with the breathing issues. She states her change in gait has led to her using a walker, she only wears oxygen at night because her sats have been good during the day, and her family thinks this is the way things will be from here on out. She wants to get stronger and feel more steady on her feet. She feels like coming here will help her understand what is going on and boost her stamina    Expected Outcomes Short: attend pulmonary rehab for exercise and education. Long: develop and maintain positive self care habits.    Continue Psychosocial Services  Follow up required by staff           Psychosocial Re-Evaluation:  Psychosocial Re-Evaluation    [place] Name 11/17/19 1559 12/15/19 1554           Psychosocial Re-Evaluation   Current issues with Current Stress Concerns History of Depression;Current Stress Concerns      Comments Patient reports no issues with their current mental states, sleep,  stress, depression or anxiety. Will follow up with patient in a few weeks for any changes. Kiaya states that her mental health is doing well. She wants to be able to walk better and not worry about falling all the time. She is very fearful of falling and it hinders her from doing the things she wants. She has a positive attitude and is willing to work hard in class.      Expected Outcomes Short: Continue to exercise regularly to support mental health and notify staff of any changes. Long: maintain mental health and well being through teaching of rehab  or prescribed medications independently. Short: Continue to exercise regularly to support mental health and notify staff of any changes. Long: maintain mental health and well being through teaching of rehab or prescribed medications independently.      Interventions Encouraged to attend Pulmonary Rehabilitation for the exercise Encouraged to attend Pulmonary Rehabilitation for the exercise      Continue Psychosocial Services  Follow up required by staff Follow up required by staff        Initial Review   Source of Stress Concerns Chronic Illness;Unable to perform yard/household activities --             Psychosocial Discharge (Final Psychosocial Re-Evaluation):  Psychosocial Re-Evaluation - 12/15/19 1554      Psychosocial Re-Evaluation   Current issues with History of Depression;Current Stress Concerns    Comments Candelaria states that her mental health is doing well. She wants to be able to walk better and not worry about falling all the time. She is very fearful of falling and it hinders her from doing the things she wants. She has a positive attitude and is willing to work hard in class.    Expected Outcomes Short: Continue to exercise regularly to support mental health and notify staff of any changes. Long: maintain mental health and well being through teaching of rehab or prescribed medications independently.    Interventions Encouraged to attend Pulmonary Rehabilitation for the exercise    Continue Psychosocial Services  Follow up required by staff           Education: Education Goals: Education classes will be provided on a weekly basis, covering required topics. Participant will state understanding/return demonstration of topics presented.  Learning Barriers/Preferences:  Learning Barriers/Preferences - 10/28/19 1417      Learning Barriers/Preferences   Learning Barriers None    Learning Preferences None           General Pulmonary Education Topics:  Infection  Prevention: - Provides verbal and written material to individual with discussion of infection control including proper hand washing and proper equipment cleaning during exercise session.   Pulmonary Rehab from 12/28/2019 in [facility] Cardiac and Pulmonary Rehab  Date 11/01/19  Educator AS  Instruction Review Code 1- Verbalizes Understanding      Falls Prevention: - Provides verbal and written material to individual with discussion of falls prevention and safety.   Pulmonary Rehab from 12/28/2019 in [facility] Cardiac and Pulmonary Rehab  Date 11/01/19  Educator AS  Instruction Review Code 1- Verbalizes Understanding      Chronic Lung Diseases: - Group verbal and written instruction to review updates, respiratory medications, advancements in procedures and treatments. Discuss use of supplemental oxygen including available portable oxygen systems, continuous and intermittent flow rates, concentrators, personal use and safety guidelines. Review proper use of inhaler and spacers. Provide informative websites for self-education.    Pulmonary Rehab from 12/28/2019 in [facility] Cardiac and  Pulmonary Rehab  Date 11/30/19  Educator [facility]  Instruction Review Code 1- Verbalizes Understanding      Energy Conservation: - Provide group verbal and written instruction for methods to conserve energy, plan and organize activities. Instruct on pacing techniques, use of adaptive equipment and posture/positioning to relieve shortness of breath.   Pulmonary Rehab from 12/28/2019 in [facility] Cardiac and Pulmonary Rehab  Date 11/30/19  Educator [facility]  Instruction Review Code 1- Verbalizes Understanding      Triggers and Exacerbations: - Group verbal and written instruction to review types of environmental triggers and ways to prevent exacerbations. Discuss weather changes, air quality and the benefits of nasal washing. Review warning signs and symptoms to help prevent infections. Discuss techniques for effective airway  clearance, coughing, and vibrations.   Pulmonary Rehab from 12/28/2019 in [facility] Cardiac and Pulmonary Rehab  Date 11/30/19  Educator [facility]  Instruction Review Code 1- Verbalizes Understanding      AED/CPR: - Group verbal and written instruction with the use of models to demonstrate the basic use of the AED with the basic ABC's of resuscitation.   Anatomy and Physiology of the Lungs: - Group verbal and written instruction with the use of models to provide basic lung anatomy and physiology related to function, structure and complications of lung disease.   Pulmonary Rehab from 12/28/2019 in [facility] Cardiac and Pulmonary Rehab  Date 11/30/19  Educator [facility]  Instruction Review Code 1- Verbalizes Understanding      Anatomy & Physiology of the Heart: - Group verbal and written instruction and models provide basic cardiac anatomy and physiology, with the coronary electrical and arterial systems. Review of Valvular disease and Heart Failure   Pulmonary Rehab from 12/28/2019 in [facility] Cardiac and Pulmonary Rehab  Date 12/28/19  Educator [facility]  Instruction Review Code 1- Verbalizes Understanding      Cardiac Medications: - Group verbal and written instruction to review commonly prescribed medications for heart disease. Reviews the medication, class of the drug, and side effects.   Pulmonary Rehab from 12/28/2019 in [facility] Cardiac and Pulmonary Rehab  Date 11/16/19  Educator SB  Instruction Review Code 1- Verbalizes Understanding      Other: -Provides group and verbal instruction on various topics (see comments)   Knowledge Questionnaire Score:  Knowledge Questionnaire Score - 11/01/19 1433      Knowledge Questionnaire Score   Pre Score 14/18            Core Components/Risk Factors/Patient Goals at Admission:  Personal Goals and Risk Factors at Admission - 11/01/19 1444      Core Components/Risk Factors/Patient Goals on Admission    Weight Management Yes    Intervention Weight  Management: Develop a combined nutrition and exercise program designed to reach desired caloric intake, while maintaining appropriate intake of nutrient and fiber, sodium and fats, and appropriate energy expenditure required for the weight goal.;Weight Management: Provide education and appropriate resources to help participant work on and attain dietary goals.    Admit Weight 158 lb 4.8 oz (71.8 kg)    Goal Weight: Short Term 155 lb (70.3 kg)    Goal Weight: Long Term 155 lb (70.3 kg)    Expected Outcomes Weight Maintenance: Understanding of the daily nutrition guidelines, which includes 25-35% calories from fat, 7% or less cal from saturated fats, less than $RemoveB'200mg'LAdlbHPg$  cholesterol, less than 1.5gm of sodium, & 5 or more servings of fruits and vegetables daily    Hypertension Yes    Intervention Provide education on  lifestyle modifcations including regular physical activity/exercise, weight management, moderate sodium restriction and increased consumption of fresh fruit, vegetables, and low fat dairy, alcohol moderation, and smoking cessation.;Monitor prescription use compliance.    Expected Outcomes Long Term: Maintenance of blood pressure at goal levels.;Short Term: Continued assessment and intervention until BP is < 140/48mm HG in hypertensive participants. < 130/6mm HG in hypertensive participants with diabetes, heart failure or chronic kidney disease.           Education:Diabetes - Individual verbal and written instruction to review signs/symptoms of diabetes, desired ranges of glucose level fasting, after meals and with exercise. Acknowledge that pre and post exercise glucose checks will be done for 3 sessions at entry of program.   Education: Know Your Numbers and Risk Factors: -Group verbal and written instruction about important numbers in your health.  Discussion of what are risk factors and how they play a role in the disease process.  Review of Cholesterol, Blood Pressure, Diabetes, and BMI  and the role they play in your overall health.   Pulmonary Rehab from 12/28/2019 in [facility] Cardiac and Pulmonary Rehab  Date 11/23/19  Educator SB  Instruction Review Code 1- Verbalizes Understanding      Core Components/Risk Factors/Patient Goals Review:   Goals and Risk Factor Review    Row Name 11/17/19 1553 12/15/19 1552           Core Components/Risk Factors/Patient Goals Review   Personal Goals Review Weight Management/Obesity;Hypertension Weight Management/Obesity;Improve shortness of breath with ADL's;Hypertension      Review Spoke to patient about their shortness of breath and what they can do to improve. Patient has been informed of breathing techniques when starting the program. Patient is informed to tell staff if they have had any med changes and that certain meds they are taking or not taking can be causing shortness of breath. Vinia has had better blood presure reading since she started the program she was high upon entry and is now getting within normal limits. She states that her shortness of breath is improving and she is able to do more at home. She has no other questions at this time and is willing to work hard in class.      Expected Outcomes Short: Attend [facility] regularly to improve shortness of breath with ADL's. Long: maintain independence with ADL's Short: attend [facility] Regularly. Long: graduate [place].             Core Components/Risk Factors/Patient Goals at Discharge (Final Review):   Goals and Risk Factor Review - 12/15/19 1552      Core Components/Risk Factors/Patient Goals Review   Personal Goals Review Weight Management/Obesity;Improve shortness of breath with ADL's;Hypertension    Review Anise has had better blood presure reading since she started the program she was high upon entry and is now getting within normal limits. She states that her shortness of breath is improving and she is able to do more at home. She has no other questions at this  time and is willing to work hard in class.    Expected Outcomes Short: attend [facility] Regularly. Long: graduate [place].           ITP Comments:  ITP Comments    Row Name 10/28/19 1425 11/01/19 1446 11/02/19 1528 11/09/19 1506 11/10/19 1232   ITP Comments Initial telephone encounter completed. Diagnosis can be found in [facility] 7/15. EP orientation scheduled for 8/31 at 1pm. Completed 6MWT and gym orientation. Initial ITP created and sent for review to  Dr. [name], Medical Director. First full day of exercise!  Patient was oriented to gym and equipment including functions, settings, policies, and procedures.  Patient's individual exercise prescription and treatment plan were reviewed.  All starting workloads were established based on the results of the 6 minute walk test done at initial orientation visit.  The plan for exercise progression was also introduced and progression will be customized based on patient's performance and goals 30 day review completed. ITP sent to Dr. [name], Medical Director of Cardiac and Pulmonary Rehab. Continue with ITP unless changes are made by physician. Completed initial RD Evaluation   Row Name 12/07/19 0533 12/07/19 1644 01/04/20 0627       ITP Comments 30 Day review completed. Medical Director ITP review done, changes made as directed, and signed approval by Medical Director. Reviewed home exercise with pt today.  Pt plans to use the recumbant bike for exercise.  Reviewed THR, pulse, RPE, sign and symptoms, pulse oximetery and when to call 911 or MD.  Also discussed weather considerations and indoor options.  Pt voiced understanding. 30 Day review completed. Medical Director ITP review done, changes made as directed, and signed approval by Medical Director.            Comments:

## 2020-01-04 NOTE — Progress Notes (Signed)
Daily Session Note  Patient Details  Name: BRIGITTE SODERBERG MRN: [id number] Date of Birth: Apr 08, 1950 Referring Provider:     Pulmonary Rehab from 11/01/2019 in [facility] Cardiac and Pulmonary Rehab  Referring Provider [name]      Encounter Date: 01/04/2020  Check In:  Session Check In - 01/04/20 [place]      Check-In   Supervising physician immediately available to respond to emergencies See telemetry face sheet for immediately available ER MD    Location [facility]-Cardiac & Pulmonary Rehab    Staff Present Birdie Sons, MPA, RN;Meredith Sherryll Burger, RN BSN;Joseph Lou Miner, [place] Exercise Physiologist    Virtual Visit No    Medication changes reported     No    Fall or balance concerns reported    No    Warm-up and Cool-down Performed on first and last piece of equipment    Resistance Training Performed Yes    VAD Patient? No    PAD/SET Patient? No      Pain Assessment   Currently in Pain? No/denies              Social History   Tobacco Use  Smoking Status Former Smoker  Smokeless Tobacco Never Used  Tobacco Comment   quit 10 years    Goals Met:  Independence with exercise equipment Exercise tolerated well No report of cardiac concerns or symptoms Strength training completed today  Goals Unmet:  Not Applicable  Comments: Pt able to follow exercise prescription today without complaint.  Will continue to monitor for progression.    Dr. [name] is Medical Director for [place] and [facility].

## 2020-01-05 ENCOUNTER — Encounter: Payer: PPO | Admitting: *Deleted

## 2020-01-05 ENCOUNTER — Other Ambulatory Visit: Payer: Self-pay

## 2020-01-05 DIAGNOSIS — J449 Chronic obstructive pulmonary disease, unspecified: Secondary | ICD-10-CM

## 2020-01-05 DIAGNOSIS — I10 Essential (primary) hypertension: Secondary | ICD-10-CM | POA: Diagnosis not present

## 2020-01-05 NOTE — Progress Notes (Signed)
Daily Session Note  Patient Details  Name: Abigail Hill MRN: [id number] Date of Birth: 06/03/1950 Referring Provider:     Pulmonary Rehab from 11/01/2019 in [facility] Cardiac and Pulmonary Rehab  Referring Provider [name]      Encounter Date: 01/05/2020  Check In:  Session Check In - 01/05/20 1540      Check-In   Supervising physician immediately available to respond to emergencies See telemetry face sheet for immediately available ER MD    Location [facility]-Cardiac & Pulmonary Rehab    Staff Present Renita Papa, RN BSN;Joseph [address] [place], [place], [place], CCRP, CCET    Virtual Visit No    Medication changes reported     No    Fall or balance concerns reported    No    Warm-up and Cool-down Performed on first and last piece of equipment    Resistance Training Performed Yes    VAD Patient? No    PAD/SET Patient? No      Pain Assessment   Currently in Pain? No/denies              Social History   Tobacco Use  Smoking Status Former Smoker  Smokeless Tobacco Never Used  Tobacco Comment   quit 10 years    Goals Met:  Independence with exercise equipment Exercise tolerated well No report of cardiac concerns or symptoms Strength training completed today  Goals Unmet:  Not Applicable  Comments: Pt able to follow exercise prescription today without complaint.  Will continue to monitor for progression.    Dr. [name] is Medical Director for [place] and [facility].

## 2020-01-09 ENCOUNTER — Other Ambulatory Visit: Payer: Self-pay

## 2020-01-09 DIAGNOSIS — J449 Chronic obstructive pulmonary disease, unspecified: Secondary | ICD-10-CM | POA: Diagnosis not present

## 2020-01-09 NOTE — Progress Notes (Signed)
Daily Session Note  Patient Details  Name: Abigail Hill MRN: [id number] Date of Birth: [birth year] Referring Provider:     Pulmonary Rehab from 11/01/2019 in [facility] Cardiac and Pulmonary Rehab  Referring Provider [name]      Encounter Date: 01/09/2020  Check In:  Session Check In - 01/09/20 [place]      Check-In   Supervising physician immediately available to respond to emergencies See telemetry face sheet for immediately available ER MD    Location [facility]-Cardiac & Pulmonary Rehab    Staff Present Birdie Sons, MPA, Mauricia Area, BS, ACSM CEP, Exercise Physiologist;Kara Eliezer Bottom, MS Exercise Physiologist    Virtual Visit No    Medication changes reported     No    Fall or balance concerns reported    No    Warm-up and Cool-down Performed on first and last piece of equipment    Resistance Training Performed Yes    VAD Patient? No    PAD/SET Patient? No      Pain Assessment   Currently in Pain? No/denies              Social History   Tobacco Use  Smoking Status Former Smoker  Smokeless Tobacco Never Used  Tobacco Comment   quit 10 years    Goals Met:  Independence with exercise equipment Exercise tolerated well No report of cardiac concerns or symptoms Strength training completed today  Goals Unmet:  Not Applicable  Comments: Pt able to follow exercise prescription today without complaint.  Will continue to monitor for progression.    Dr. [name] is Medical Director for [place] and [facility].

## 2020-01-11 ENCOUNTER — Other Ambulatory Visit: Payer: Self-pay

## 2020-01-11 DIAGNOSIS — J449 Chronic obstructive pulmonary disease, unspecified: Secondary | ICD-10-CM

## 2020-01-11 NOTE — Progress Notes (Signed)
Daily Session Note  Patient Details  Name: Abigail Hill MRN: [id number] Date of Birth: [birth year] Referring Provider:     Pulmonary Rehab from 11/01/2019 in [facility] Cardiac and Pulmonary Rehab  Referring Provider [name]      Encounter Date: 01/11/2020  Check In:  Session Check In - 01/11/20 1540      Check-In   Supervising physician immediately available to respond to emergencies See telemetry face sheet for immediately available ER MD    Location [facility]-Cardiac & Pulmonary Rehab    Staff Present Birdie Sons, MPA, Elveria Rising, BA, ACSM CEP, Exercise Physiologist;Kara Eliezer Bottom, MS Exercise Physiologist;Meredith Sherryll Burger, RN BSN    Virtual Visit No    Medication changes reported     No    Fall or balance concerns reported    No    Warm-up and Cool-down Performed on first and last piece of equipment    Resistance Training Performed Yes    VAD Patient? No    PAD/SET Patient? No      Pain Assessment   Currently in Pain? No/denies              Social History   Tobacco Use  Smoking Status Former Smoker  Smokeless Tobacco Never Used  Tobacco Comment   quit 10 years    Goals Met:  Independence with exercise equipment Exercise tolerated well No report of cardiac concerns or symptoms Strength training completed today  Goals Unmet:  Not Applicable  Comments: Pt able to follow exercise prescription today without complaint.  Will continue to monitor for progression.    Dr. [name] is Medical Director for [place] and [facility].

## 2020-01-12 ENCOUNTER — Other Ambulatory Visit: Payer: Self-pay

## 2020-01-12 ENCOUNTER — Encounter: Payer: PPO | Admitting: *Deleted

## 2020-01-12 DIAGNOSIS — J432 Centrilobular emphysema: Secondary | ICD-10-CM | POA: Diagnosis not present

## 2020-01-12 DIAGNOSIS — N1832 Chronic kidney disease, stage 3b: Secondary | ICD-10-CM | POA: Diagnosis not present

## 2020-01-12 DIAGNOSIS — J9611 Chronic respiratory failure with hypoxia: Secondary | ICD-10-CM | POA: Diagnosis not present

## 2020-01-12 DIAGNOSIS — I639 Cerebral infarction, unspecified: Secondary | ICD-10-CM | POA: Diagnosis not present

## 2020-01-12 DIAGNOSIS — J449 Chronic obstructive pulmonary disease, unspecified: Secondary | ICD-10-CM

## 2020-01-12 DIAGNOSIS — I10 Essential (primary) hypertension: Secondary | ICD-10-CM | POA: Diagnosis not present

## 2020-01-12 DIAGNOSIS — G912 (Idiopathic) normal pressure hydrocephalus: Secondary | ICD-10-CM | POA: Diagnosis not present

## 2020-01-12 NOTE — Progress Notes (Signed)
Daily Session Note  Patient Details  Name: SHAWNI VOLKOV MRN: [id number] Date of Birth: 1950-06-03 Referring Provider:     Pulmonary Rehab from 11/01/2019 in [facility] Cardiac and Pulmonary Rehab  Referring Provider [name]      Encounter Date: 01/12/2020  Check In:  Session Check In - 01/12/20 1545      Check-In   Supervising physician immediately available to respond to emergencies See telemetry face sheet for immediately available ER MD    Location [facility]-Cardiac & Pulmonary Rehab    Staff Present Renita Papa, RN BSN;Joseph [address] [place], [place], [place], CCRP, CCET    Virtual Visit No    Medication changes reported     No    Fall or balance concerns reported    No    Warm-up and Cool-down Performed on first and last piece of equipment    Resistance Training Performed Yes    VAD Patient? No    PAD/SET Patient? No      Pain Assessment   Currently in Pain? No/denies              Social History   Tobacco Use  Smoking Status Former Smoker  Smokeless Tobacco Never Used  Tobacco Comment   quit 10 years    Goals Met:  Independence with exercise equipment Exercise tolerated well No report of cardiac concerns or symptoms Strength training completed today  Goals Unmet:  Not Applicable  Comments: Pt able to follow exercise prescription today without complaint.  Will continue to monitor for progression.    Dr. [name] is Medical Director for [place] and [facility].

## 2020-01-16 ENCOUNTER — Other Ambulatory Visit: Payer: Self-pay

## 2020-01-16 DIAGNOSIS — J449 Chronic obstructive pulmonary disease, unspecified: Secondary | ICD-10-CM

## 2020-01-16 NOTE — Progress Notes (Signed)
Daily Session Note  Patient Details  Name: Abigail Hill MRN: [id number] Date of Birth: [birth year] Referring Provider:     Pulmonary Rehab from 11/01/2019 in [facility] Cardiac and Pulmonary Rehab  Referring Provider [name]      Encounter Date: 01/16/2020  Check In:  Session Check In - 01/16/20 1536      Check-In   Supervising physician immediately available to respond to emergencies See telemetry face sheet for immediately available ER MD    Location [facility]-Cardiac & Pulmonary Rehab    Staff Present Birdie Sons, MPA, Mauricia Area, BS, ACSM CEP, Exercise Physiologist;Jessica [place], MA, RCEP, CCRP, Marylynn Pearson, MS Exercise Physiologist    Virtual Visit No    Medication changes reported     No    Fall or balance concerns reported    No    Warm-up and Cool-down Performed on first and last piece of equipment    Resistance Training Performed Yes    VAD Patient? No    PAD/SET Patient? No      Pain Assessment   Currently in Pain? No/denies              Social History   Tobacco Use  Smoking Status Former Smoker  Smokeless Tobacco Never Used  Tobacco Comment   quit 10 years    Goals Met:  Independence with exercise equipment Exercise tolerated well No report of cardiac concerns or symptoms Strength training completed today  Goals Unmet:  Not Applicable  Comments: Pt able to follow exercise prescription today without complaint.  Will continue to monitor for progression.    Dr. [name] is Medical Director for [place] and [facility].

## 2020-01-18 ENCOUNTER — Other Ambulatory Visit: Payer: Self-pay

## 2020-01-18 DIAGNOSIS — J449 Chronic obstructive pulmonary disease, unspecified: Secondary | ICD-10-CM | POA: Diagnosis not present

## 2020-01-18 NOTE — Progress Notes (Signed)
Daily Session Note  Patient Details  Name: [name] MRN: [id number] Date of Birth: 12-Dec-1950 Referring Provider:     Pulmonary Rehab from 11/01/2019 in [facility] Cardiac and Pulmonary Rehab  Referring Provider [name]      Encounter Date: 01/18/2020  Check In:  Session Check In - 01/18/20 1528      Check-In   Supervising physician immediately available to respond to emergencies See telemetry face sheet for immediately available ER MD    Location [facility]-Cardiac & Pulmonary Rehab    Staff Present Birdie Sons, MPA, RN;Joseph Lou Miner, [place] Exercise Physiologist    Virtual Visit No    Medication changes reported     No    Fall or balance concerns reported    No    Warm-up and Cool-down Performed on first and last piece of equipment    Resistance Training Performed Yes    VAD Patient? No    PAD/SET Patient? No      Pain Assessment   Currently in Pain? No/denies              Social History   Tobacco Use  Smoking Status Former Smoker  Smokeless Tobacco Never Used  Tobacco Comment   quit 10 years    Goals Met:  Independence with exercise equipment Exercise tolerated well No report of cardiac concerns or symptoms Strength training completed today  Goals Unmet:  Not Applicable  Comments: Pt able to follow exercise prescription today without complaint.  Will continue to monitor for progression.    Dr. [name] is Medical Director for [place] and [facility].

## 2020-01-19 ENCOUNTER — Encounter: Payer: PPO | Admitting: *Deleted

## 2020-01-19 ENCOUNTER — Other Ambulatory Visit: Payer: Self-pay

## 2020-01-19 DIAGNOSIS — J449 Chronic obstructive pulmonary disease, unspecified: Secondary | ICD-10-CM | POA: Diagnosis not present

## 2020-01-19 NOTE — Progress Notes (Signed)
Daily Session Note  Patient Details  Name: Abigail Hill MRN: [id number] Date of Birth: 1950-05-01 Referring Provider:     Pulmonary Rehab from 11/01/2019 in [facility] Cardiac and Pulmonary Rehab  Referring Provider [name]      Encounter Date: 01/19/2020  Check In:  Session Check In - 01/19/20 1539      Check-In   Supervising physician immediately available to respond to emergencies See telemetry face sheet for immediately available ER MD    Location [facility]-Cardiac & Pulmonary Rehab    Staff Present Renita Papa, RN BSN;Joseph [address] [place], [place], [place], CCRP, CCET    Virtual Visit No    Medication changes reported     No    Fall or balance concerns reported    No    Warm-up and Cool-down Performed on first and last piece of equipment    Resistance Training Performed Yes    VAD Patient? No    PAD/SET Patient? No      Pain Assessment   Currently in Pain? No/denies              Social History   Tobacco Use  Smoking Status Former Smoker  Smokeless Tobacco Never Used  Tobacco Comment   quit 10 years    Goals Met:  Independence with exercise equipment Exercise tolerated well No report of cardiac concerns or symptoms Strength training completed today  Goals Unmet:  Not Applicable  Comments: Pt able to follow exercise prescription today without complaint.  Will continue to monitor for progression.    Dr. [name] is Medical Director for [place] and [facility].

## 2020-01-19 NOTE — Patient Instructions (Signed)
Discharge Patient Instructions  Patient Details  Name: Abigail Hill MRN: [id number] Date of Birth: [birth year] Referring Provider:  Ottie Glazier, MD   Number of Visits: 61  Reason for Discharge:  Patient reached a stable level of exercise. Patient independent in their exercise. Patient has met program and personal goals.  Smoking History:  Social History   Tobacco Use  Smoking Status Former Smoker  Smokeless Tobacco Never Used  Tobacco Comment   quit 10 years    Diagnosis:  Chronic obstructive pulmonary disease, unspecified COPD type ([place])  Initial Exercise Prescription:  Initial Exercise Prescription - 11/01/19 1400      Date of Initial Exercise RX and Referring Provider   Date 11/01/19    Referring Provider Aleskerov      Treadmill   MPH 0.4    Grade 0    Minutes 15    METs 1      Recumbant Bike   Level 1    RPM 60    Minutes 15    METs 1      NuStep   Level 1    SPM 80    Minutes 15    METs 1      Prescription Details   Frequency (times per week) 3    Duration Progress to 30 minutes of continuous aerobic without signs/symptoms of physical distress      Intensity   THRR 40-80% of Max Heartrate 111-137    Ratings of Perceived Exertion 11-15    Perceived Dyspnea 0-4      Resistance Training   Training Prescription Yes    Weight 2 lb    Reps 10-15           Discharge Exercise Prescription (Final Exercise Prescription Changes):  Exercise Prescription Changes - 01/09/20 1500      Response to Exercise   Blood Pressure (Admit) 158/74    Blood Pressure (Exercise) 142/60    Blood Pressure (Exit) 132/72    Heart Rate (Admit) 94 bpm    Heart Rate (Exercise) 92 bpm    Heart Rate (Exit) 84 bpm    Oxygen Saturation (Admit) 94 %    Oxygen Saturation (Exercise) 94 %    Oxygen Saturation (Exit) 94 %    Rating of Perceived Exertion (Exercise) 12    Perceived Dyspnea (Exercise) 0    Symptoms none    Duration Continue with 30 min of aerobic  exercise without signs/symptoms of physical distress.    Intensity THRR unchanged      Progression   Progression Continue to progress workloads to maintain intensity without signs/symptoms of physical distress.    Average METs 2.19      Resistance Training   Training Prescription Yes    Weight 3 lb    Reps 10-15      Interval Training   Interval Training No      Treadmill   MPH 0.4    Grade 0    Minutes 15    METs 1.3      Recumbant Bike   Level 3    Watts 13    Minutes 15    METs 2.67      NuStep   Level 4    Minutes 15    METs 1.9      Home Exercise Plan   Plans to continue exercise at Home (comment)   Recumbant bike   Frequency Add 2 additional days to program exercise sessions.    Initial Home Exercises  Provided 12/07/19           Functional Capacity:  6 Minute Walk    Row Name 11/01/19 1434 11/02/19 1623 01/11/20 1600     6 Minute Walk   Phase Initial -- Discharge   Distance 195 feet -- 340 feet   Distance % Change -- -- 74 %   Distance Feet Change -- -- 145 ft   Walk Time 6 minutes -- 6 minutes   # of Rest Breaks 0 -- 0   MPH 0.37 -- 0.64   METS 0.9 -- 1.6   RPE -- 13 13   Perceived Dyspnea  -- 1 2   VO2 Peak 3.11 -- 5.56   Symptoms No -- No   Resting HR 87 bpm -- 90 bpm   Resting BP 122/66 -- 122/62   Resting Oxygen Saturation  94 % -- 93 %   Exercise Oxygen Saturation  during 6 min walk 85 % -- 87 %   Max Ex. HR 112 bpm -- 125 bpm   Max Ex. BP 140/62 -- 172/88   2 Minute Post BP 130/64 -- --     Interval HR   1 Minute HR 101 -- 100   2 Minute HR 100 -- 114   3 Minute HR 108 -- 119   4 Minute HR 110 -- 119   5 Minute HR 110 -- 122   6 Minute HR 112 -- 125   2 Minute Post HR 96 -- --   Interval Heart Rate? Yes -- Yes     Interval Oxygen   Interval Oxygen? Yes -- Yes   Baseline Oxygen Saturation % 94 % -- 93 %   1 Minute Oxygen Saturation % 90 % -- 91 %   1 Minute Liters of Oxygen 0 L -- 0 L   2 Minute Oxygen Saturation % 88 % --  89 %   2 Minute Liters of Oxygen 0 L -- 0 L   3 Minute Oxygen Saturation % 87 % -- 88 %   3 Minute Liters of Oxygen 0 L -- 0 L   4 Minute Oxygen Saturation % 85 % -- 87 %   4 Minute Liters of Oxygen 0 L -- 0 L   5 Minute Oxygen Saturation % 87 % -- 87 %   5 Minute Liters of Oxygen 0 L -- 0 L   6 Minute Oxygen Saturation % 86 % -- 87 %   6 Minute Liters of Oxygen 0 L -- 0 L   2 Minute Post Oxygen Saturation % 90 % -- --   2 Minute Post Liters of Oxygen 0 L -- --          Quality of Life:   Personal Goals: Goals established at orientation with interventions provided to work toward goal.  Personal Goals and Risk Factors at Admission - 11/01/19 1444      Core Components/Risk Factors/Patient Goals on Admission    Weight Management Yes    Intervention Weight Management: Develop a combined nutrition and exercise program designed to reach desired caloric intake, while maintaining appropriate intake of nutrient and fiber, sodium and fats, and appropriate energy expenditure required for the weight goal.;Weight Management: Provide education and appropriate resources to help participant work on and attain dietary goals.    Admit Weight 158 lb 4.8 oz (71.8 kg)    Goal Weight: Short Term 155 lb (70.3 kg)    Goal Weight: Long Term 155 lb (70.3 kg)  Expected Outcomes Weight Maintenance: Understanding of the daily nutrition guidelines, which includes 25-35% calories from fat, 7% or less cal from saturated fats, less than 236m cholesterol, less than 1.5gm of sodium, & 5 or more servings of fruits and vegetables daily    Hypertension Yes    Intervention Provide education on lifestyle modifcations including regular physical activity/exercise, weight management, moderate sodium restriction and increased consumption of fresh fruit, vegetables, and low fat dairy, alcohol moderation, and smoking cessation.;Monitor prescription use compliance.    Expected Outcomes Long Term: Maintenance of blood pressure  at goal levels.;Short Term: Continued assessment and intervention until BP is < 140/953mHG in hypertensive participants. < 130/8062mG in hypertensive participants with diabetes, heart failure or chronic kidney disease.            Personal Goals Discharge:  Goals and Risk Factor Review - 01/12/20 1555      Core Components/Risk Factors/Patient Goals Review   Personal Goals Review Weight Management/Obesity;Improve shortness of breath with ADL's;Hypertension    Review XylAleysha maintaining her weight between 156-155 lb.  She has also noted that her legs are no longer swelling as much as they were before.  Her blood pressures have been running lower recently.  Her breathing has greatly improved and she is now able to do more.    Expected Outcomes Continue to keep eye on weight and swelling.           Exercise Goals and Review:  Exercise Goals    Row Name 11/01/19 1440             Exercise Goals   Increase Physical Activity Yes       Intervention Provide advice, education, support and counseling about physical activity/exercise needs.;Develop an individualized exercise prescription for aerobic and resistive training based on initial evaluation findings, risk stratification, comorbidities and participant's personal goals.       Expected Outcomes Short Term: Attend rehab on a regular basis to increase amount of physical activity.;Long Term: Add in home exercise to make exercise part of routine and to increase amount of physical activity.       Increase Strength and Stamina Yes       Intervention Provide advice, education, support and counseling about physical activity/exercise needs.;Develop an individualized exercise prescription for aerobic and resistive training based on initial evaluation findings, risk stratification, comorbidities and participant's personal goals.       Expected Outcomes Short Term: Increase workloads from initial exercise prescription for resistance, speed, and  METs.;Short Term: Perform resistance training exercises routinely during rehab and add in resistance training at home;Long Term: Improve cardiorespiratory fitness, muscular endurance and strength as measured by increased METs and functional capacity (6MWT)       Able to understand and use rate of perceived exertion (RPE) scale Yes       Intervention Provide education and explanation on how to use RPE scale       Expected Outcomes Short Term: Able to use RPE daily in rehab to express subjective intensity level;Long Term:  Able to use RPE to guide intensity level when exercising independently       Able to understand and use Dyspnea scale Yes       Intervention Provide education and explanation on how to use Dyspnea scale       Expected Outcomes Short Term: Able to use Dyspnea scale daily in rehab to express subjective sense of shortness of breath during exertion;Long Term: Able to use Dyspnea scale to guide  intensity level when exercising independently       Knowledge and understanding of Target Heart Rate Range (THRR) Yes       Intervention Provide education and explanation of THRR including how the numbers were predicted and where they are located for reference       Expected Outcomes Short Term: Able to state/look up THRR;Short Term: Able to use daily as guideline for intensity in rehab;Long Term: Able to use THRR to govern intensity when exercising independently       Able to check pulse independently Yes       Intervention Provide education and demonstration on how to check pulse in carotid and radial arteries.;Review the importance of being able to check your own pulse for safety during independent exercise       Expected Outcomes Short Term: Able to explain why pulse checking is important during independent exercise;Long Term: Able to check pulse independently and accurately       Understanding of Exercise Prescription Yes       Intervention Provide education, explanation, and written materials  on patient's individual exercise prescription       Expected Outcomes Short Term: Able to explain program exercise prescription;Long Term: Able to explain home exercise prescription to exercise independently              Exercise Goals Re-Evaluation:  Exercise Goals Re-Evaluation    Row Name 11/02/19 1529 11/15/19 0732 11/17/19 1600 11/28/19 1129 12/12/19 1559     Exercise Goal Re-Evaluation   Exercise Goals Review Increase Physical Activity;Able to understand and use rate of perceived exertion (RPE) scale;Knowledge and understanding of Target Heart Rate Range (THRR);Understanding of Exercise Prescription;Increase Strength and Stamina;Able to check pulse independently Increase Physical Activity;Increase Strength and Stamina;Understanding of Exercise Prescription Increase Physical Activity;Increase Strength and Stamina Increase Physical Activity;Increase Strength and Stamina Increase Physical Activity;Increase Strength and Stamina;Understanding of Exercise Prescription   Comments Reviewed RPE and dyspnea scales, THR and program prescription with pt today.  Pt voiced understanding and was given a copy of goals to take home. Everlene is off to a good start in rehab.  She is up to 5 min on the treadmill and 10 watts on the bike.  We will continue to monitor her progress. On her off days she rides her bike at home for 15 minutes. She is improving quickly in [facility] and is able to walk better with her walker. Once she gets more strength she is going to try the treadmill. Vaanya has tried the TM and did well.  She is progressing well on other machines. Brenley is doing well in rehab.  She is up to level 4 on the NuStep.  We will continue to monitor her progress.   Expected Outcomes Short: Use RPE daily to regulate intensity. Long: Follow program prescription in THR. Short: Continue to attend regularly Long: Continue to follow program prescription. Short: use the treadmill. Long: continue to gain strength and  maintain an exercise routine at home Short: continue to build time on TM Long: build overall stamina Short: Continue to build stamina on treadmill Long: Continue to strengthen legs   Row Name 12/15/19 1557 12/28/19 1238 01/09/20 1518 01/12/20 1549       Exercise Goal Re-Evaluation   Exercise Goals Review Increase Physical Activity;Increase Strength and Stamina Increase Physical Activity;Increase Strength and Stamina Increase Physical Activity;Increase Strength and Stamina;Understanding of Exercise Prescription Increase Physical Activity;Increase Strength and Stamina;Understanding of Exercise Prescription    Comments Raelyn is able to do  more since the start of rehab. She has been able to go higher on her levels and maintain them. She wants to try the treadnmill when she is strong enough and someone can be with her. Chaka attends consistently and works at [employer] 11[employer]13.  Staff will work with her on walking on the TM. Harleigh is doing well in rehab.  She is nearing graduation already and should improve on her walk test, if her legs will cooperate with her.  She is up to level 3 on the bike.  We will continue to monitor her progress. Shirleymae improved her post walk by 74%!!  She is feeling stronger and feels that exercise has been a big help.  She is planning to join the [employer] after graduation to continue to exercise.    Expected Outcomes Short: walk on the treadmill. Long: walk on the treadmill independently. Short: continue to attend consistently Long:  build stamina on TM Short: Improve post 6MWT Long: Continue to improve stamina Short: Graduation Long; Continue to exercise after graduation           Nutrition & Weight - Outcomes:  Pre Biometrics - 11/01/19 1442      Pre Biometrics   Height 5' 1.25" (1.556 m)    Weight 158 lb 4.8 oz (71.8 kg)    BMI (Calculated) 29.66            Nutrition:  Nutrition Therapy & Goals - 11/10/19 1215      Nutrition Therapy   Diet heart healthy, low Na, Pulmonary  MNT    Protein (specify units) 85g    Fiber 25 grams    Whole Grain Foods 3 servings    Saturated Fats 12 max. grams    Fruits and Vegetables 5 servings/day    Sodium 1.5 grams      Personal Nutrition Goals   Nutrition Goal ST: add in beans, try daves bread LT: Buiding muscle to increase strength and balance    Comments B: oatmeal with raisins and brown sugar or cheerios with bananas, weekend bacon or sausage and eggs. L: sandwich, fruit, and low fat chips. D: chicken, pork, or ground beefs, tacos and spaghetti. Her husband cooks for her and he doesn't like whole wheat - suggested a pt favorite Daves killer bread and adding fiber in other ways like including beans. Reviewed heart healthy eating and pulmonary MNT.      Intervention Plan   Intervention Prescribe, educate and counsel regarding individualized specific dietary modifications aiming towards targeted core components such as weight, hypertension, lipid management, diabetes, heart failure and other comorbidities.;Nutrition handout(s) given to patient.    Expected Outcomes Short Term Goal: Understand basic principles of dietary content, such as calories, fat, sodium, cholesterol and nutrients.;Short Term Goal: A plan has been developed with personal nutrition goals set during dietitian appointment.;Long Term Goal: Adherence to prescribed nutrition plan.           Nutrition Discharge:  Nutrition Assessments - 01/16/20 1624      MEDFICTS Scores   Post Score 41           Education Questionnaire Score:  Knowledge Questionnaire Score - 01/16/20 1624      Knowledge Questionnaire Score   Post Score 16/18           Goals reviewed with patient; copy given to patient.

## 2020-01-23 ENCOUNTER — Other Ambulatory Visit: Payer: Self-pay

## 2020-01-23 DIAGNOSIS — J449 Chronic obstructive pulmonary disease, unspecified: Secondary | ICD-10-CM

## 2020-01-23 NOTE — Progress Notes (Signed)
Discharge Progress Report  Patient Details  Name: Abigail Hill MRN: [id number] Date of Birth: [birth year] Referring Provider:     Pulmonary Rehab from 11/01/2019 in [facility]  Referring Provider Aleskerov       Number of Visits: 75  Reason for Discharge:  Patient reached a stable level of exercise. Patient independent in their exercise. Patient has met program and personal goals.  Smoking History:  Social History   Tobacco Use  Smoking Status Former Smoker  Smokeless Tobacco Never Used  Tobacco Comment   quit 10 years    Diagnosis:  Chronic obstructive pulmonary disease, unspecified COPD type ([place])  ADL UCSD:  Pulmonary Assessment Scores    Row Name 11/01/19 1431 11/02/19 1624 01/16/20 1626     ADL UCSD   SOB Score total 54 -- 16   Rest 0 -- 0   Walk 3 -- 1   Stairs -- -- 1   Bath 0 -- 1   Dress 1 -- 0   Shop 5 -- 1     CAT Score   CAT Score 18 -- 5     mMRC Score   mMRC Score -- 2 --          Initial Exercise Prescription:  Initial Exercise Prescription - 11/01/19 1400      Date of Initial Exercise RX and Referring Provider   Date 11/01/19    Referring Provider Aleskerov      Treadmill   MPH 0.4    Grade 0    Minutes 15    METs 1      Recumbant Bike   Level 1    RPM 60    Minutes 15    METs 1      NuStep   Level 1    SPM 80    Minutes 15    METs 1      Prescription Details   Frequency (times per week) 3    Duration Progress to 30 minutes of continuous aerobic without signs/symptoms of physical distress      Intensity   THRR 40-80% of Max Heartrate 111-137    Ratings of Perceived Exertion 11-15    Perceived Dyspnea 0-4      Resistance Training   Training Prescription Yes    Weight 2 lb    Reps 10-15           Discharge Exercise Prescription (Final Exercise Prescription Changes):  Exercise Prescription Changes - 01/23/20 0800      Response to Exercise   Blood Pressure (Admit) 124/64    Blood  Pressure (Exercise) 134/64    Blood Pressure (Exit) 136/78    Heart Rate (Admit) 64 bpm    Heart Rate (Exercise) 97 bpm    Heart Rate (Exit) 82 bpm    Oxygen Saturation (Admit) 93 %    Oxygen Saturation (Exercise) 91 %    Oxygen Saturation (Exit) 92 %    Rating of Perceived Exertion (Exercise) 11    Perceived Dyspnea (Exercise) 1    Symptoms none    Duration Continue with 30 min of aerobic exercise without signs/symptoms of physical distress.    Intensity THRR unchanged      Progression   Progression Continue to progress workloads to maintain intensity without signs/symptoms of physical distress.    Average METs 2      Resistance Training   Training Prescription Yes    Weight 3 lb    Reps 10-15  Interval Training   Interval Training No      Recumbant Bike   Level 3    Watts 11    Minutes 15    METs 2.5      NuStep   Level 4    SPM 80    Minutes 15    METs 1.6      Home Exercise Plan   Plans to continue exercise at Home (comment)   Recumbant bike   Frequency Add 2 additional days to program exercise sessions.    Initial Home Exercises Provided 12/07/19           Functional Capacity:  6 Minute Walk    Row Name 11/01/19 1434 11/02/19 1623 01/11/20 1600     6 Minute Walk   Phase Initial -- Discharge   Distance 195 feet -- 340 feet   Distance % Change -- -- 74 %   Distance Feet Change -- -- 145 ft   Walk Time 6 minutes -- 6 minutes   # of Rest Breaks 0 -- 0   MPH 0.37 -- 0.64   METS 0.9 -- 1.6   RPE -- 13 13   Perceived Dyspnea  -- 1 2   VO2 Peak 3.11 -- 5.56   Symptoms No -- No   Resting HR 87 bpm -- 90 bpm   Resting BP 122/66 -- 122/62   Resting Oxygen Saturation  94 % -- 93 %   Exercise Oxygen Saturation  during 6 min walk 85 % -- 87 %   Max Ex. HR 112 bpm -- 125 bpm   Max Ex. BP 140/62 -- 172/88   2 Minute Post BP 130/64 -- --     Interval HR   1 Minute HR 101 -- 100   2 Minute HR 100 -- 114   3 Minute HR 108 -- 119   4 Minute HR 110 --  119   5 Minute HR 110 -- 122   6 Minute HR 112 -- 125   2 Minute Post HR 96 -- --   Interval Heart Rate? Yes -- Yes     Interval Oxygen   Interval Oxygen? Yes -- Yes   Baseline Oxygen Saturation % 94 % -- 93 %   1 Minute Oxygen Saturation % 90 % -- 91 %   1 Minute Liters of Oxygen 0 L -- 0 L   2 Minute Oxygen Saturation % 88 % -- 89 %   2 Minute Liters of Oxygen 0 L -- 0 L   3 Minute Oxygen Saturation % 87 % -- 88 %   3 Minute Liters of Oxygen 0 L -- 0 L   4 Minute Oxygen Saturation % 85 % -- 87 %   4 Minute Liters of Oxygen 0 L -- 0 L   5 Minute Oxygen Saturation % 87 % -- 87 %   5 Minute Liters of Oxygen 0 L -- 0 L   6 Minute Oxygen Saturation % 86 % -- 87 %   6 Minute Liters of Oxygen 0 L -- 0 L   2 Minute Post Oxygen Saturation % 90 % -- --   2 Minute Post Liters of Oxygen 0 L -- --          Psychological, QOL, Others - Outcomes: PHQ 2/9: Depression screen [facility] 2/9 01/16/2020 11/01/2019 10/28/2016  Decreased Interest 0 - 0  Down, Depressed, Hopeless 0 0 0  PHQ - 2 Score 0 0 0  Altered  sleeping 0 0 -  Tired, decreased energy 0 0 -  Change in appetite 0 0 -  Feeling bad or failure about yourself  0 0 -  Trouble concentrating 0 0 -  Moving slowly or fidgety/restless 1 2 -  Suicidal thoughts 0 0 -  PHQ-9 Score 1 2 -  Difficult doing work/chores Not difficult at all Somewhat difficult -  Some recent data might be hidden      Nutrition & Weight - Outcomes:  Pre Biometrics - 11/01/19 1442      Pre Biometrics   Height 5' 1.25" (1.556 m)    Weight 158 lb 4.8 oz (71.8 kg)    BMI (Calculated) 29.66            Nutrition:  Nutrition Therapy & Goals - 11/10/19 1215      Nutrition Therapy   Diet heart healthy, low Na, Pulmonary MNT    Protein (specify units) 85g    Fiber 25 grams    Whole Grain Foods 3 servings    Saturated Fats 12 max. grams    Fruits and Vegetables 5 servings/day    Sodium 1.5 grams      Personal Nutrition Goals   Nutrition Goal ST: add  in beans, try daves bread LT: Buiding muscle to increase strength and balance    Comments B: oatmeal with raisins and brown sugar or cheerios with bananas, weekend bacon or sausage and eggs. L: sandwich, fruit, and low fat chips. D: chicken, pork, or ground beefs, tacos and spaghetti. Her husband cooks for her and he doesn't like whole wheat - suggested a pt favorite Daves killer bread and adding fiber in other ways like including beans. Reviewed heart healthy eating and pulmonary MNT.      Intervention Plan   Intervention Prescribe, educate and counsel regarding individualized specific dietary modifications aiming towards targeted core components such as weight, hypertension, lipid management, diabetes, heart failure and other comorbidities.;Nutrition handout(s) given to patient.    Expected Outcomes Short Term Goal: Understand basic principles of dietary content, such as calories, fat, sodium, cholesterol and nutrients.;Short Term Goal: A plan has been developed with personal nutrition goals set during dietitian appointment.;Long Term Goal: Adherence to prescribed nutrition plan.           Nutrition Discharge:  Nutrition Assessments - 01/16/20 1624      MEDFICTS Scores   Post Score 41           Education Questionnaire Score:  Knowledge Questionnaire Score - 01/16/20 1624      Knowledge Questionnaire Score   Post Score 16/18           Goals reviewed with patient; copy given to patient.

## 2020-01-23 NOTE — Progress Notes (Signed)
Pulmonary Individual Treatment Plan  Patient Details  Name: Abigail Hill MRN: [id number] Date of Birth: 09-06-1950 Referring Provider:     Pulmonary Rehab from 11/01/2019 in [facility] Cardiac and Pulmonary Rehab  Referring Provider Aleskerov      Initial Encounter Date:    Pulmonary Rehab from 11/01/2019 in [facility] Cardiac and Pulmonary Rehab  Date 11/01/19      Visit Diagnosis: Chronic obstructive pulmonary disease, unspecified COPD type ([place])  Patient's Home Medications on Admission:  Current Outpatient Medications:  .  albuterol (PROVENTIL) (2.5 MG/3ML) 0.083% nebulizer solution, Take 3 mLs (2.5 mg total) by nebulization every 6 (six) hours as needed for wheezing or shortness of breath., Disp: 360 mL, Rfl: 0 .  albuterol (VENTOLIN HFA) 108 (90 Base) MCG/ACT inhaler, Inhale 2 puffs into the lungs every 6 (six) hours as needed for wheezing or shortness of breath. (Patient not taking: Reported on 09/07/2019), Disp: 8 g, Rfl: 0 .  alendronate (FOSAMAX) 70 MG tablet, Take 70 mg by mouth every Monday.  (Patient not taking: Reported on 09/07/2019), Disp: , Rfl:  .  aspirin 81 MG chewable tablet, Chew 81 mg by mouth daily., Disp: , Rfl:  .  Calcium Carb-Cholecalciferol (OYSTER SHELL CALCIUM) 500-400 MG-UNIT TABS, Take 1 tablet by mouth daily.  (Patient not taking: Reported on 10/28/2019), Disp: , Rfl:  .  furosemide (LASIX) 20 MG tablet, Take 1 tablet (20 mg total) by mouth 2 (two) times daily. (Patient not taking: Reported on 10/28/2019), Disp: 60 tablet, Rfl: 0 .  ketoconazole (NIZORAL) 2 % shampoo, Apply topically 3 (three) times a week. (Patient not taking: Reported on 10/28/2019), Disp: , Rfl:  .  mirabegron ER (MYRBETRIQ) 50 MG TB24 tablet, Take 1 tablet (50 mg total) by mouth daily., Disp: 30 tablet, Rfl: 11 .  Multiple Vitamins-Minerals (CENTRUM SILVER PO), Take 1 tablet by mouth daily.  (Patient not taking: Reported on 10/28/2019), Disp: , Rfl:  .  nystatin-triamcinolone ointment (MYCOLOG), Apply 1  application topically 2 (two) times daily. (Patient not taking: Reported on 10/28/2019), Disp: 30 g, Rfl: 0 .  torsemide (DEMADEX) 20 MG tablet, Take 20 mg by mouth daily. (Patient not taking: Reported on 10/28/2019), Disp: , Rfl:  .  umeclidinium-vilanterol (ANORO ELLIPTA) 62.5-25 MCG/INH AEPB, Inhale 1 puff into the lungs daily. (Patient not taking: Reported on 09/07/2019), Disp: 60 each, Rfl: 0  Past Medical History: Past Medical History:  Diagnosis Date  . Abnormal gait 12/18/2015  . BP (high blood pressure) 08/24/2015  . Cancer ([place])    skin  . Constriction of ureter (postoperative) 07/28/2015  . Hydronephrosis 07/14/2015  . Hypertension   . Left foot drop 12/18/2015  . Recurrent UTI   . UPJ (ureteropelvic junction) obstruction 07/28/2015  . UTI (lower urinary tract infection) 07/14/2015    Tobacco Use: Social History   Tobacco Use  Smoking Status Former Smoker  Smokeless Tobacco Never Used  Tobacco Comment   quit 10 years    Labs: Recent Review Scientist, physiological    Labs for ITP Cardiac and Pulmonary Rehab Latest Ref Rng & Units 05/16/2019   PHART 7.35 - 7.45 7.38   PCO2ART 32 - 48 mmHg 88(HH)   HCO3 20.0 - 28.0 mmol/L 52.1(H)   O2SAT % 92.6       Pulmonary Assessment Scores:  Pulmonary Assessment Scores    Row Name 11/01/19 1431 11/02/19 1624 01/16/20 1626     ADL UCSD   SOB Score total 54 -- 16   Rest 0 --  0   Walk 3 -- 1   Stairs -- -- 1   Bath 0 -- 1   Dress 1 -- 0   Shop 5 -- 1     CAT Score   CAT Score 18 -- 5     mMRC Score   mMRC Score -- 2 --          UCSD: Self-administered rating of dyspnea associated with activities of daily living (ADLs) 6-point scale (0 = "not at all" to 5 = "maximal or unable to do because of breathlessness")  Scoring Scores range from 0 to 120.  Minimally important difference is 5 units  CAT: CAT can identify the health impairment of COPD patients and is better correlated with disease progression.  CAT has a scoring range  of zero to 40. The CAT score is classified into four groups of low (less than 10), medium (10 - 20), high (21-30) and very high (31-40) based on the impact level of disease on health status. A CAT score over 10 suggests significant symptoms.  A worsening CAT score could be explained by an exacerbation, poor medication adherence, poor inhaler technique, or progression of COPD or comorbid conditions.  CAT MCID is 2 points  mMRC: mMRC (Modified Medical Research Council) Dyspnea Scale is used to assess the degree of baseline functional disability in patients of respiratory disease due to dyspnea. No minimal important difference is established. A decrease in score of 1 point or greater is considered a positive change.   Pulmonary Function Assessment:  Pulmonary Function Assessment - 11/01/19 1430      Pulmonary Function Tests   FVC% 76 %    FEV1% 45 %    FEV1/FVC Ratio 47    RV% 77 %    DLCO% 61 %           Exercise Target Goals: Exercise Program Goal: Individual exercise prescription set using results from initial 6 min walk test and THRR while considering  patient's activity barriers and safety.   Exercise Prescription Goal: Initial exercise prescription builds to 30-45 minutes a day of aerobic activity, 2-3 days per week.  Home exercise guidelines will be given to patient during program as part of exercise prescription that the participant will acknowledge.  Education: Aerobic Exercise: - Group verbal and visual presentation on the components of exercise prescription. Introduces F.I.T.T principle from ACSM for exercise prescriptions.  Reviews F.I.T.T. principles of aerobic exercise including progression. Written material given at graduation.   Pulmonary Rehab from 12/28/2019 in [facility] Cardiac and Pulmonary Rehab  Date 12/28/19  Hilda Blades only on 10/27]  Educator [facility]  Instruction Review Code 1- [employer] Understanding      Education: Resistance Exercise: - Group verbal and visual  presentation on the components of exercise prescription. Introduces F.I.T.T principle from ACSM for exercise prescriptions  Reviews F.I.T.T. principles of resistance exercise including progression. Written material given at graduation.    Education: Exercise & Equipment Safety: - Individual verbal instruction and demonstration of equipment use and safety with use of the equipment.   Pulmonary Rehab from 12/28/2019 in [facility] Cardiac and Pulmonary Rehab  Date 11/01/19  Educator AS  Instruction Review Code 1- Verbalizes Understanding      Education: Exercise Physiology & General Exercise Guidelines: - Group verbal and written instruction with models to review the exercise physiology of the cardiovascular system and associated critical values. Provides general exercise guidelines with specific guidelines to those with heart or lung disease.    Pulmonary Rehab from  12/28/2019 in [facility] Cardiac and Pulmonary Rehab  Date 12/14/19  Educator AS  Instruction Review Code 1- Verbalizes Understanding      Education: Flexibility, Balance, Mind/Body Relaxation: - Group verbal and visual presentation with interactive activity on the components of exercise prescription. Introduces F.I.T.T principle from ACSM for exercise prescriptions. Reviews F.I.T.T. principles of flexibility and balance exercise training including progression. Also discusses the mind body connection.  Reviews various relaxation techniques to help reduce and manage stress (i.e. Deep breathing, progressive muscle relaxation, and visualization). Balance handout provided to take home. Written material given at graduation.   Pulmonary Rehab from 12/28/2019 in [facility] Cardiac and Pulmonary Rehab  Date 11/02/19  Educator AS  Instruction Review Code 1- Verbalizes Understanding      Activity Barriers & Risk Stratification:  Activity Barriers & Cardiac Risk Stratification - 10/28/19 1415      Activity Barriers & Cardiac Risk Stratification    Activity Barriers Balance Concerns;Assistive Device;Other (comment);Muscular Weakness    Comments broken collar bone easter 2020- no restrictions           6 Minute Walk:  6 Minute Walk    Row Name 11/01/19 1434 11/02/19 1623 01/11/20 1600     6 Minute Walk   Phase Initial -- Discharge   Distance 195 feet -- 340 feet   Distance % Change -- -- 74 %   Distance Feet Change -- -- 145 ft   Walk Time 6 minutes -- 6 minutes   # of Rest Breaks 0 -- 0   MPH 0.37 -- 0.64   METS 0.9 -- 1.6   RPE -- 13 13   Perceived Dyspnea  -- 1 2   VO2 Peak 3.11 -- 5.56   Symptoms No -- No   Resting HR 87 bpm -- 90 bpm   Resting BP 122/66 -- 122/62   Resting Oxygen Saturation  94 % -- 93 %   Exercise Oxygen Saturation  during 6 min walk 85 % -- 87 %   Max Ex. HR 112 bpm -- 125 bpm   Max Ex. BP 140/62 -- 172/88   2 Minute Post BP 130/64 -- --     Interval HR   1 Minute HR 101 -- 100   2 Minute HR 100 -- 114   3 Minute HR 108 -- 119   4 Minute HR 110 -- 119   5 Minute HR 110 -- 122   6 Minute HR 112 -- 125   2 Minute Post HR 96 -- --   Interval Heart Rate? Yes -- Yes     Interval Oxygen   Interval Oxygen? Yes -- Yes   Baseline Oxygen Saturation % 94 % -- 93 %   1 Minute Oxygen Saturation % 90 % -- 91 %   1 Minute Liters of Oxygen 0 L -- 0 L   2 Minute Oxygen Saturation % 88 % -- 89 %   2 Minute Liters of Oxygen 0 L -- 0 L   3 Minute Oxygen Saturation % 87 % -- 88 %   3 Minute Liters of Oxygen 0 L -- 0 L   4 Minute Oxygen Saturation % 85 % -- 87 %   4 Minute Liters of Oxygen 0 L -- 0 L   5 Minute Oxygen Saturation % 87 % -- 87 %   5 Minute Liters of Oxygen 0 L -- 0 L   6 Minute Oxygen Saturation % 86 % -- 87 %   6  Minute Liters of Oxygen 0 L -- 0 L   2 Minute Post Oxygen Saturation % 90 % -- --   2 Minute Post Liters of Oxygen 0 L -- --         Oxygen Initial Assessment:  Oxygen Initial Assessment - 10/28/19 1410      Home Oxygen   Home Oxygen Device Portable Concentrator     Sleep Oxygen Prescription Continuous    Liters per minute 2    Home Exercise Oxygen Prescription Continuous   O2 is usually above 94% during the day without any oxygen, wears pulse ox and monitors during the day   Liters per minute 2    Home Resting Oxygen Prescription Continuous    Liters per minute 2    Compliance with Home Oxygen Use Yes   only wears if O2 sat drops     Intervention   Short Term Goals To learn and exhibit compliance with exercise, home and travel O2 prescription;To learn and understand importance of monitoring SPO2 with pulse oximeter and demonstrate accurate use of the pulse oximeter.;To learn and understand importance of maintaining oxygen saturations>88%;To learn and demonstrate proper pursed lip breathing techniques or other breathing techniques.    Long  Term Goals Exhibits compliance with exercise, home and travel O2 prescription;Maintenance of O2 saturations>88%;Verbalizes importance of monitoring SPO2 with pulse oximeter and return demonstration;Exhibits proper breathing techniques, such as pursed lip breathing or other method taught during program session           Oxygen Re-Evaluation:  Oxygen Re-Evaluation    Row Name 11/17/19 1550 12/15/19 1546 01/12/20 1602         Program Oxygen Prescription   Program Oxygen Prescription None None None     Liters per minute 0 0 --       Home Oxygen   Home Oxygen Device Home Concentrator;Portable Concentrator Home Concentrator Home Concentrator     Sleep Oxygen Prescription Continuous Continuous Continuous     Liters per minute 2 2 2      Home Exercise Oxygen Prescription None None None     Liters per minute 0 -- 0     Home Resting Oxygen Prescription None None None     Liters per minute 0 -- 0     Compliance with Home Oxygen Use Yes Yes Yes       Goals/Expected Outcomes   Short Term Goals To learn and understand importance of monitoring SPO2 with pulse oximeter and demonstrate accurate use of the pulse  oximeter.;To learn and exhibit compliance with exercise, home and travel O2 prescription;To learn and understand importance of maintaining oxygen saturations>88%;To learn and demonstrate proper pursed lip breathing techniques or other breathing techniques.;To learn and demonstrate proper use of respiratory medications To learn and understand importance of monitoring SPO2 with pulse oximeter and demonstrate accurate use of the pulse oximeter.;To learn and understand importance of maintaining oxygen saturations>88% To learn and understand importance of monitoring SPO2 with pulse oximeter and demonstrate accurate use of the pulse oximeter.;To learn and understand importance of maintaining oxygen saturations>88%;To learn and exhibit compliance with exercise, home and travel O2 prescription;To learn and demonstrate proper pursed lip breathing techniques or other breathing techniques.     Long  Term Goals Exhibits compliance with exercise, home and travel O2 prescription;Verbalizes importance of monitoring SPO2 with pulse oximeter and return demonstration;Maintenance of O2 saturations>88%;Exhibits proper breathing techniques, such as pursed lip breathing or other method taught during program session;Compliance with respiratory medication;Demonstrates proper use of  MDI's Verbalizes importance of monitoring SPO2 with pulse oximeter and return demonstration;Maintenance of O2 saturations>88% Exhibits compliance with exercise, home and travel O2 prescription;Verbalizes importance of monitoring SPO2 with pulse oximeter and return demonstration;Maintenance of O2 saturations>88%;Exhibits proper breathing techniques, such as pursed lip breathing or other method taught during program session     Comments Informed patient how to perform the Pursed Lipped breathing technique. Told patient to Inhale through the nose and out the mouth with pursed lips to keep their airways open, help oxygenate them better, practice when at rest  or doing strenuous activity. Patient Verbalizes understanding of technique and will work on and be reiterated during [place]. Tysheka has does not take any respiratory medications. She checks her oxygen at home and only wears oxygen at night. She knows that her oxygen level should be 88 percent and above. Karia continues to do well with her oxygen therapy at night. Her saturations have been good on room air during the day.  She will use her PLB when she feels like she needs it.     Goals/Expected Outcomes Short: use PLB with exertion. Long: use PLB on exertion proficiently and independently. Short: continue [facility] and inform staff of any questions regarding her respiratory status. Long: Use all techniques and knowledge learned independently Continue to stay compliant and use PLB.            Oxygen Discharge (Final Oxygen Re-Evaluation):  Oxygen Re-Evaluation - 01/12/20 1602      Program Oxygen Prescription   Program Oxygen Prescription None      Home Oxygen   Home Oxygen Device Home Concentrator    Sleep Oxygen Prescription Continuous    Liters per minute 2    Home Exercise Oxygen Prescription None    Liters per minute 0    Home Resting Oxygen Prescription None    Liters per minute 0    Compliance with Home Oxygen Use Yes      Goals/Expected Outcomes   Short Term Goals To learn and understand importance of monitoring SPO2 with pulse oximeter and demonstrate accurate use of the pulse oximeter.;To learn and understand importance of maintaining oxygen saturations>88%;To learn and exhibit compliance with exercise, home and travel O2 prescription;To learn and demonstrate proper pursed lip breathing techniques or other breathing techniques.    Long  Term Goals Exhibits compliance with exercise, home and travel O2 prescription;Verbalizes importance of monitoring SPO2 with pulse oximeter and return demonstration;Maintenance of O2 saturations>88%;Exhibits proper breathing techniques, such as  pursed lip breathing or other method taught during program session    Comments Breannah continues to do well with her oxygen therapy at night. Her saturations have been good on room air during the day.  She will use her PLB when she feels like she needs it.    Goals/Expected Outcomes Continue to stay compliant and use PLB.           Initial Exercise Prescription:  Initial Exercise Prescription - 11/01/19 1400      Date of Initial Exercise RX and Referring Provider   Date 11/01/19    Referring Provider Aleskerov      Treadmill   MPH 0.4    Grade 0    Minutes 15    METs 1      Recumbant Bike   Level 1    RPM 60    Minutes 15    METs 1      NuStep   Level 1    SPM 80  Minutes 15    METs 1      Prescription Details   Frequency (times per week) 3    Duration Progress to 30 minutes of continuous aerobic without signs/symptoms of physical distress      Intensity   THRR 40-80% of Max Heartrate 111-137    Ratings of Perceived Exertion 11-15    Perceived Dyspnea 0-4      Resistance Training   Training Prescription Yes    Weight 2 lb    Reps 10-15           Perform Capillary Blood Glucose checks as needed.  Exercise Prescription Changes:  Exercise Prescription Changes    Row Name 11/01/19 1400 11/02/19 1600 11/15/19 0700 11/28/19 1100 12/07/19 1600     Response to Exercise   Blood Pressure (Admit) 122/66 -- 136/76 126/80 --   Blood Pressure (Exercise) 140/62 -- 140/74 142/80 --   Blood Pressure (Exit) 130/64 -- 122/70 132/64 --   Heart Rate (Admit) 87 bpm -- 88 bpm 88 bpm --   Heart Rate (Exercise) 112 bpm -- 98 bpm 93 bpm --   Heart Rate (Exit) 96 bpm -- 84 bpm 79 bpm --   Oxygen Saturation (Admit) 94 % -- 95 % 94 % --   Oxygen Saturation (Exercise) 85 % -- 92 % 93 % --   Oxygen Saturation (Exit) 90 % -- 95 % 94 % --   Rating of Perceived Exertion (Exercise) -- 13 15 12  --   Perceived Dyspnea (Exercise) -- 1 1 0 --   Symptoms none -- none none --   Comments  walk test -- -- -- --   Duration -- -- Progress to 30 minutes of  aerobic without signs/symptoms of physical distress Progress to 30 minutes of  aerobic without signs/symptoms of physical distress --   Intensity -- -- THRR unchanged THRR unchanged --     Progression   Progression -- -- Continue to progress workloads to maintain intensity without signs/symptoms of physical distress. Continue to progress workloads to maintain intensity without signs/symptoms of physical distress. --   Average METs -- -- 1.95 2 --     Resistance Training   Training Prescription -- -- Yes Yes --   Weight -- -- 2 lb 2 lb --   Reps -- -- 10-15 10-15 --     Interval Training   Interval Training -- -- No No --     Treadmill   MPH -- -- 0.4 -- --   Grade -- -- 0 -- --   Minutes -- -- 5 -- --   METs -- -- 1.3 -- --     Recumbant Bike   Level -- -- 1 1 --   Minutes -- -- 15 15 --   METs -- -- 2.3 2.4 --     NuStep   Level -- -- 1 1 --   SPM -- -- -- 80 --   Minutes -- -- 15 15 --   METs -- -- 2.1 1.6 --     Home Exercise Plan   Plans to continue exercise at -- -- -- -- Home (comment)  Recumbant bike   Frequency -- -- -- -- Add 2 additional days to program exercise sessions.   Initial Home Exercises Provided -- -- -- -- 12/07/19   Row Name 12/12/19 1600 12/28/19 1200 01/09/20 1500 01/23/20 0800       Response to Exercise   Blood Pressure (Admit) 146/70 128/82 158/74 124/64  Blood Pressure (Exercise) 150/74 132/80 142/60 134/64    Blood Pressure (Exit) 140/80 130/70 132/72 136/78    Heart Rate (Admit) 96 bpm 88 bpm 94 bpm 64 bpm    Heart Rate (Exercise) 94 bpm 95 bpm 92 bpm 97 bpm    Heart Rate (Exit) 80 bpm 89 bpm 84 bpm 82 bpm    Oxygen Saturation (Admit) 90 % 93 % 94 % 93 %    Oxygen Saturation (Exercise) 90 % 90 % 94 % 91 %    Oxygen Saturation (Exit) 94 % 93 % 94 % 92 %    Rating of Perceived Exertion (Exercise) 13 11 12 11     Perceived Dyspnea (Exercise) 0 0 0 1    Symptoms none none  none none    Duration Continue with 30 min of aerobic exercise without signs/symptoms of physical distress. Continue with 30 min of aerobic exercise without signs/symptoms of physical distress. Continue with 30 min of aerobic exercise without signs/symptoms of physical distress. Continue with 30 min of aerobic exercise without signs/symptoms of physical distress.    Intensity THRR unchanged THRR unchanged THRR unchanged THRR unchanged      Progression   Progression Continue to progress workloads to maintain intensity without signs/symptoms of physical distress. Continue to progress workloads to maintain intensity without signs/symptoms of physical distress. Continue to progress workloads to maintain intensity without signs/symptoms of physical distress. Continue to progress workloads to maintain intensity without signs/symptoms of physical distress.    Average METs 2.46 1.8 2.19 2      Resistance Training   Training Prescription Yes Yes Yes Yes    Weight 3 lb 3 lb 3 lb 3 lb    Reps 10-15 10-15 10-15 10-15      Interval Training   Interval Training No No No No      Treadmill   MPH -- -- 0.4 --    Grade -- -- 0 --    Minutes -- -- 15 --    METs -- -- 1.3 --      Recumbant Bike   Level 1 -- 3 3    [place] -- -- 13 11    Minutes 15 -- 15 15    METs 2.63 -- 2.67 2.5      NuStep   Level 4 3 4 4     SPM -- 80 -- 80    Minutes 15 15 15 15     METs 1.9 1.7 1.9 1.6      Home Exercise Plan   Plans to continue exercise at Home (comment)  Recumbant bike Home (comment)  Recumbant bike Home (comment)  Recumbant bike Home (comment)  Recumbant bike    Frequency Add 2 additional days to program exercise sessions. Add 2 additional days to program exercise sessions. Add 2 additional days to program exercise sessions. Add 2 additional days to program exercise sessions.    Initial Home Exercises Provided 12/07/19 12/07/19 12/07/19 12/07/19           Exercise Comments:   Exercise Goals and  Review:  Exercise Goals    Row Name 11/01/19 1440             Exercise Goals   Increase Physical Activity Yes       Intervention Provide advice, education, support and counseling about physical activity/exercise needs.;Develop an individualized exercise prescription for aerobic and resistive training based on initial evaluation findings, risk stratification, comorbidities and participant's personal goals.       Expected  Outcomes Short Term: Attend rehab on a regular basis to increase amount of physical activity.;Long Term: Add in home exercise to make exercise part of routine and to increase amount of physical activity.       Increase Strength and Stamina Yes       Intervention Provide advice, education, support and counseling about physical activity/exercise needs.;Develop an individualized exercise prescription for aerobic and resistive training based on initial evaluation findings, risk stratification, comorbidities and participant's personal goals.       Expected Outcomes Short Term: Increase workloads from initial exercise prescription for resistance, speed, and METs.;Short Term: Perform resistance training exercises routinely during rehab and add in resistance training at home;Long Term: Improve cardiorespiratory fitness, muscular endurance and strength as measured by increased METs and functional capacity (6MWT)       Able to understand and use rate of perceived exertion (RPE) scale Yes       Intervention Provide education and explanation on how to use RPE scale       Expected Outcomes Short Term: Able to use RPE daily in rehab to express subjective intensity level;Long Term:  Able to use RPE to guide intensity level when exercising independently       Able to understand and use Dyspnea scale Yes       Intervention Provide education and explanation on how to use Dyspnea scale       Expected Outcomes Short Term: Able to use Dyspnea scale daily in rehab to express subjective sense of  shortness of breath during exertion;Long Term: Able to use Dyspnea scale to guide intensity level when exercising independently       Knowledge and understanding of Target Heart Rate Range (THRR) Yes       Intervention Provide education and explanation of THRR including how the numbers were predicted and where they are located for reference       Expected Outcomes Short Term: Able to state/look up THRR;Short Term: Able to use daily as guideline for intensity in rehab;Long Term: Able to use THRR to govern intensity when exercising independently       Able to check pulse independently Yes       Intervention Provide education and demonstration on how to check pulse in carotid and radial arteries.;Review the importance of being able to check your own pulse for safety during independent exercise       Expected Outcomes Short Term: Able to explain why pulse checking is important during independent exercise;Long Term: Able to check pulse independently and accurately       Understanding of Exercise Prescription Yes       Intervention Provide education, explanation, and written materials on patient's individual exercise prescription       Expected Outcomes Short Term: Able to explain program exercise prescription;Long Term: Able to explain home exercise prescription to exercise independently              Exercise Goals Re-Evaluation :  Exercise Goals Re-Evaluation    Row Name 11/02/19 1529 11/15/19 0732 11/17/19 1600 11/28/19 1129 12/12/19 1559     Exercise Goal Re-Evaluation   Exercise Goals Review Increase Physical Activity;Able to understand and use rate of perceived exertion (RPE) scale;Knowledge and understanding of Target Heart Rate Range (THRR);Understanding of Exercise Prescription;Increase Strength and Stamina;Able to check pulse independently Increase Physical Activity;Increase Strength and Stamina;Understanding of Exercise Prescription Increase Physical Activity;Increase Strength and Stamina  Increase Physical Activity;Increase Strength and Stamina Increase Physical Activity;Increase Strength and Stamina;Understanding of Exercise Prescription  Comments Reviewed RPE and dyspnea scales, THR and program prescription with pt today.  Pt voiced understanding and was given a copy of goals to take home. Johan is off to a good start in rehab.  She is up to 5 min on the treadmill and 10 watts on the bike.  We will continue to monitor her progress. On her off days she rides her bike at home for 15 minutes. She is improving quickly in [facility] and is able to walk better with her walker. Once she gets more strength she is going to try the treadmill. Princes has tried the TM and did well.  She is progressing well on other machines. Kimbria is doing well in rehab.  She is up to level 4 on the NuStep.  We will continue to monitor her progress.   Expected Outcomes Short: Use RPE daily to regulate intensity. Long: Follow program prescription in THR. Short: Continue to attend regularly Long: Continue to follow program prescription. Short: use the treadmill. Long: continue to gain strength and maintain an exercise routine at home Short: continue to build time on TM Long: build overall stamina Short: Continue to build stamina on treadmill Long: Continue to strengthen legs   Row Name 12/15/19 1557 12/28/19 1238 01/09/20 1518 01/12/20 1549       Exercise Goal Re-Evaluation   Exercise Goals Review Increase Physical Activity;Increase Strength and Stamina Increase Physical Activity;Increase Strength and Stamina Increase Physical Activity;Increase Strength and Stamina;Understanding of Exercise Prescription Increase Physical Activity;Increase Strength and Stamina;Understanding of Exercise Prescription    Comments Donzella is able to do more since the start of rehab. She has been able to go higher on her levels and maintain them. She wants to try the treadnmill when she is strong enough and someone can be with her. Verniece attends  consistently and works at [employer] 11-13.  Staff will work with her on walking on the TM. Gabbrielle is doing well in rehab.  She is nearing graduation already and should improve on her walk test, if her legs will cooperate with her.  She is up to level 3 on the bike.  We will continue to monitor her progress. Mada improved her post walk by 74%!!  She is feeling stronger and feels that exercise has been a big help.  She is planning to join the [facility] after graduation to continue to exercise.    Expected Outcomes Short: walk on the treadmill. Long: walk on the treadmill independently. Short: continue to attend consistently Long:  build stamina on TM Short: Improve post 6MWT Long: Continue to improve stamina Short: Graduation Long; Continue to exercise after graduation           Discharge Exercise Prescription (Final Exercise Prescription Changes):  Exercise Prescription Changes - 01/23/20 0800      Response to Exercise   Blood Pressure (Admit) 124/64    Blood Pressure (Exercise) 134/64    Blood Pressure (Exit) 136/78    Heart Rate (Admit) 64 bpm    Heart Rate (Exercise) 97 bpm    Heart Rate (Exit) 82 bpm    Oxygen Saturation (Admit) 93 %    Oxygen Saturation (Exercise) 91 %    Oxygen Saturation (Exit) 92 %    Rating of Perceived Exertion (Exercise) 11    Perceived Dyspnea (Exercise) 1    Symptoms none    Duration Continue with 30 min of aerobic exercise without signs/symptoms of physical distress.    Intensity THRR unchanged      Progression  Progression Continue to progress workloads to maintain intensity without signs/symptoms of physical distress.    Average METs 2      Resistance Training   Training Prescription Yes    Weight 3 lb    Reps 10-15      Interval Training   Interval Training No      Recumbant Bike   Level 3    Watts 11    Minutes 15    METs 2.5      NuStep   Level 4    SPM 80    Minutes 15    METs 1.6      Home Exercise Plan   Plans to continue exercise at  Home (comment)   Recumbant bike   Frequency Add 2 additional days to program exercise sessions.    Initial Home Exercises Provided 12/07/19           Nutrition:  Target Goals: Understanding of nutrition guidelines, daily intake of sodium 1500mg , cholesterol 200mg , calories 30% from fat and 7% or less from saturated fats, daily to have 5 or more servings of fruits and vegetables.  Education: All About Nutrition: -Group instruction provided by verbal, written material, interactive activities, discussions, models, and posters to present general guidelines for heart healthy nutrition including fat, fiber, MyPlate, the role of sodium in heart healthy nutrition, utilization of the nutrition label, and utilization of this knowledge for meal planning. Follow up email sent as well. Written material given at graduation.   Biometrics:  Pre Biometrics - 11/01/19 1442      Pre Biometrics   Height 5' 1.25" (1.556 m)    Weight 158 lb 4.8 oz (71.8 kg)    BMI (Calculated) 29.66            Nutrition Therapy Plan and Nutrition Goals:  Nutrition Therapy & Goals - 11/10/19 1215      Nutrition Therapy   Diet heart healthy, low Na, Pulmonary MNT    Protein (specify units) 85g    Fiber 25 grams    Whole Grain Foods 3 servings    Saturated Fats 12 max. grams    Fruits and Vegetables 5 servings/day    Sodium 1.5 grams      Personal Nutrition Goals   Nutrition Goal ST: add in beans, try daves bread LT: Buiding muscle to increase strength and balance    Comments B: oatmeal with raisins and brown sugar or cheerios with bananas, weekend bacon or sausage and eggs. L: sandwich, fruit, and low fat chips. D: chicken, pork, or ground beefs, tacos and spaghetti. Her husband cooks for her and he doesn't like whole wheat - suggested a pt favorite Daves killer bread and adding fiber in other ways like including beans. Reviewed heart healthy eating and pulmonary MNT.      Intervention Plan   Intervention  Prescribe, educate and counsel regarding individualized specific dietary modifications aiming towards targeted core components such as weight, hypertension, lipid management, diabetes, heart failure and other comorbidities.;Nutrition handout(s) given to patient.    Expected Outcomes Short Term Goal: Understand basic principles of dietary content, such as calories, fat, sodium, cholesterol and nutrients.;Short Term Goal: A plan has been developed with personal nutrition goals set during dietitian appointment.;Long Term Goal: Adherence to prescribed nutrition plan.           Nutrition Assessments:  Nutrition Assessments - 01/16/20 1624      MEDFICTS Scores   Post Score 41  MEDIFICTS Score Key:  ?70 Need to make dietary changes   40-70 Heart Healthy Diet  ? 40 Therapeutic Level Cholesterol Diet   Picture Your Plate Scores:  <16 Unhealthy dietary pattern with much room for improvement.  41-50 Dietary pattern unlikely to meet recommendations for good health and room for improvement.  51-60 More healthful dietary pattern, with some room for improvement.   >60 Healthy dietary pattern, although there may be some specific behaviors that could be improved.   Nutrition Goals Re-Evaluation:  Nutrition Goals Re-Evaluation    Tennyson Name 12/26/19 1540 01/12/20 1553           Goals   Nutrition Goal ST: try bean recipes (beans 3x/week) LT: Buiding muscle to increase strength and balance ST: try bean recipes (beans 3x/week) LT: Buiding muscle to increase strength and balance      Comment Vesna reports her and her husband love the whole wheat bread. She reports having pinto beans and trying black beans once - try some easy bean recipes. She reports no other changes at this time. Mikayela has been using her new recipes.  She is enjoying the greater variety.  She and her husband have a war on beans  between navy and pinto beans.  She has had made some great changes and doing well with them  thus far.      Expected Outcome ST: try bean recipes (beans 3x/week) LT: Buiding muscle to increase strength and balance Continue to try new recipes and eat healthier.             Nutrition Goals Discharge (Final Nutrition Goals Re-Evaluation):  Nutrition Goals Re-Evaluation - 01/12/20 1553      Goals   Nutrition Goal ST: try bean recipes (beans 3x/week) LT: Buiding muscle to increase strength and balance    Comment Alanii has been using her new recipes.  She is enjoying the greater variety.  She and her husband have a war on beans  between navy and pinto beans.  She has had made some great changes and doing well with them thus far.    Expected Outcome Continue to try new recipes and eat healthier.           Psychosocial: Target Goals: Acknowledge presence or absence of significant depression and/or stress, maximize coping skills, provide positive support system. Participant is able to verbalize types and ability to use techniques and skills needed for reducing stress and depression.   Education: Stress, Anxiety, and Depression - Group verbal and visual presentation to define topics covered.  Reviews how body is impacted by stress, anxiety, and depression.  Also discusses healthy ways to reduce stress and to treat/manage anxiety and depression.  Written material given at graduation.   Pulmonary Rehab from 12/28/2019 in [facility] Cardiac and Pulmonary Rehab  Date 12/07/19  Educator [facility]  Instruction Review Code 1- [employer] Understanding      Education: Sleep Hygiene -Provides group verbal and written instruction about how sleep can affect your health.  Define sleep hygiene, discuss sleep cycles and impact of sleep habits. Review good sleep hygiene tips.    Initial Review & Psychosocial Screening:  Initial Psych Review & Screening - 10/28/19 1417      Initial Review   Current issues with Current Stress Concerns    Source of Stress Concerns Chronic Illness;Unable to perform  yard/household activities;Unable to participate in former interests or hobbies      [place]? Yes   husband; children  and grandchildren     Barriers   Psychosocial barriers to participate in program There are no identifiable barriers or psychosocial needs.;The patient should benefit from training in stress management and relaxation.      Screening Interventions   Interventions Encouraged to exercise;To provide support and resources with identified psychosocial needs;Provide feedback about the scores to participant    Expected Outcomes Short Term goal: Utilizing psychosocial counselor, staff and physician to assist with identification of specific Stressors or current issues interfering with healing process. Setting desired goal for each stressor or current issue identified.;Long Term Goal: Stressors or current issues are controlled or eliminated.;Short Term goal: Identification and review with participant of any Quality of Life or Depression concerns found by scoring the questionnaire.;Long Term goal: The participant improves quality of Life and PHQ9 Scores as seen by post scores and/or verbalization of changes           Quality of Life Scores:  Scores of 19 and below usually indicate a poorer quality of life in these areas.  A difference of  2-3 points is a clinically meaningful difference.  A difference of 2-3 points in the total score of the Quality of Life Index has been associated with significant improvement in overall quality of life, self-image, physical symptoms, and general health in studies assessing change in quality of life.  PHQ-9: Recent Review Flowsheet Data    Depression screen [facility] 2/9 01/16/2020 11/01/2019 10/28/2016   Decreased Interest 0 - 0   Down, Depressed, Hopeless 0 0 0   PHQ - 2 Score 0 0 0   Altered sleeping 0 0 -   Tired, decreased energy 0 0 -   Change in appetite 0 0 -   Feeling bad or failure about yourself  0 0 -   Trouble  concentrating 0 0 -   Moving slowly or fidgety/restless 1 2  -   Suicidal thoughts 0 0 -   PHQ-9 Score 1 2 -   Difficult doing work/chores Not difficult at all Somewhat difficult -     Interpretation of Total Score  Total Score Depression Severity:  1-4 = Minimal depression, 5-9 = Mild depression, 10-14 = Moderate depression, 15-19 = Moderately severe depression, 20-27 = Severe depression   Psychosocial Evaluation and Intervention:  Psychosocial Evaluation - 10/28/19 1426      Psychosocial Evaluation & Interventions   Interventions Encouraged to exercise with the program and follow exercise prescription    Comments Aaryn has had a decline in her health more so this last year and was surprised with the breathing issues. She states her change in gait has led to her using a walker, she only wears oxygen at night because her sats have been good during the day, and her family thinks this is the way things will be from here on out. She wants to get stronger and feel more steady on her feet. She feels like coming here will help her understand what is going on and boost her stamina    Expected Outcomes Short: attend pulmonary rehab for exercise and education. Long: develop and maintain positive self care habits.    Continue Psychosocial Services  Follow up required by staff           Psychosocial Re-Evaluation:  Psychosocial Re-Evaluation    Hodges Name 11/17/19 1559 12/15/19 1554 01/12/20 1551         Psychosocial Re-Evaluation   Current issues with Current Stress Concerns History of Depression;Current Stress Concerns Current Stress Concerns  Comments Patient reports no issues with their current mental states, sleep, stress, depression or anxiety. Will follow up with patient in a few weeks for any changes. Sherina states that her mental health is doing well. She wants to be able to walk better and not worry about falling all the time. She is very fearful of falling and it hinders her from  doing the things she wants. She has a positive attitude and is willing to work hard in class. Devaney is getting ready to graduate.  She is moving around better and greatly improved her post walk.  She feels that the exercise has really made a difference for her ability to get around more.  She is feeling better overall too.  She has enjoyed meeting people and learing new recipes.  She also gained a lot from the education for learning the terminology.  She still worries about falling, but more confident now in her strength.     Expected Outcomes Short: Continue to exercise regularly to support mental health and notify staff of any changes. Long: maintain mental health and well being through teaching of rehab or prescribed medications independently. Short: Continue to exercise regularly to support mental health and notify staff of any changes. Long: maintain mental health and well being through teaching of rehab or prescribed medications independently. Short: Graduate  Long: Continue to focus on the positive.     Interventions Encouraged to attend Pulmonary Rehabilitation for the exercise Encouraged to attend Pulmonary Rehabilitation for the exercise Encouraged to attend Pulmonary Rehabilitation for the exercise     Continue Psychosocial Services  Follow up required by staff Follow up required by staff --       Initial Review   Source of Stress Concerns Chronic Illness;Unable to perform yard/household activities -- --            Psychosocial Discharge (Final Psychosocial Re-Evaluation):  Psychosocial Re-Evaluation - 01/12/20 1551      Psychosocial Re-Evaluation   Current issues with Current Stress Concerns    Comments Tiffine is getting ready to graduate.  She is moving around better and greatly improved her post walk.  She feels that the exercise has really made a difference for her ability to get around more.  She is feeling better overall too.  She has enjoyed meeting people and learing new recipes.   She also gained a lot from the education for learning the terminology.  She still worries about falling, but more confident now in her strength.    Expected Outcomes Short: Graduate  Long: Continue to focus on the positive.    Interventions Encouraged to attend Pulmonary Rehabilitation for the exercise           Education: Education Goals: Education classes will be provided on a weekly basis, covering required topics. Participant will state understanding/return demonstration of topics presented.  Learning Barriers/Preferences:  Learning Barriers/Preferences - 10/28/19 1417      Learning Barriers/Preferences   Learning Barriers None    Learning Preferences None           General Pulmonary Education Topics:  Infection Prevention: - Provides verbal and written material to individual with discussion of infection control including proper hand washing and proper equipment cleaning during exercise session.   Pulmonary Rehab from 12/28/2019 in [facility] Cardiac and Pulmonary Rehab  Date 11/01/19  Educator AS  Instruction Review Code 1- Verbalizes Understanding      Falls Prevention: - Provides verbal and written material to individual with discussion of falls  prevention and safety.   Pulmonary Rehab from 12/28/2019 in [facility] Cardiac and Pulmonary Rehab  Date 11/01/19  Educator AS  Instruction Review Code 1- Verbalizes Understanding      Chronic Lung Disease Review: - Group verbal instruction with posters, models, PowerPoint presentations and videos,  to review new updates, new respiratory medications, new advancements in procedures and treatments. Providing information on websites and "800" numbers for continued self-education. Includes information about supplement oxygen, available portable oxygen systems, continuous and intermittent flow rates, oxygen safety, concentrators, and Medicare reimbursement for oxygen. Explanation of Pulmonary Drugs, including class, frequency, complications,  importance of spacers, rinsing mouth after steroid MDI's, and proper cleaning methods for nebulizers. Review of basic lung anatomy and physiology related to function, structure, and complications of lung disease. Review of risk factors. Discussion about methods for diagnosing sleep apnea and types of masks and machines for OSA. Includes a review of the use of types of environmental controls: home humidity, furnaces, filters, dust mite/pet prevention, HEPA vacuums. Discussion about weather changes, air quality and the benefits of nasal washing. Instruction on Warning signs, infection symptoms, calling MD promptly, preventive modes, and value of vaccinations. Review of effective airway clearance, coughing and/or vibration techniques. Emphasizing that all should Create an Action Plan. Written material given at graduation.   Pulmonary Rehab from 12/28/2019 in [facility] Cardiac and Pulmonary Rehab  Date 11/30/19  Educator [facility]  Instruction Review Code 1- Verbalizes Understanding      AED/CPR: - Group verbal and written instruction with the use of models to demonstrate the basic use of the AED with the basic ABC's of resuscitation.    Anatomy and Cardiac Procedures: - Group verbal and visual presentation and models provide information about basic cardiac anatomy and function. Reviews the testing methods done to diagnose heart disease and the outcomes of the test results. Describes the treatment choices: Medical Management, Angioplasty, or Coronary Bypass Surgery for treating various heart conditions including Myocardial Infarction, Angina, Valve Disease, and Cardiac Arrhythmias.  Written material given at graduation.   Pulmonary Rehab from 12/28/2019 in [facility] Cardiac and Pulmonary Rehab  Date 12/28/19  Educator [facility]  Instruction Review Code 1- Verbalizes Understanding      Medication Safety: - Group verbal and visual instruction to review commonly prescribed medications for heart and lung disease. Reviews the  medication, class of the drug, and side effects. Includes the steps to properly store meds and maintain the prescription regimen.  Written material given at graduation.   Pulmonary Rehab from 12/28/2019 in [facility] Cardiac and Pulmonary Rehab  Date 11/16/19  Educator SB  Instruction Review Code 1- Verbalizes Understanding      Other: -Provides group and verbal instruction on various topics (see comments)   Knowledge Questionnaire Score:  Knowledge Questionnaire Score - 01/16/20 1624      Knowledge Questionnaire Score   Post Score 16/18            Core Components/Risk Factors/Patient Goals at Admission:  Personal Goals and Risk Factors at Admission - 11/01/19 1444      Core Components/Risk Factors/Patient Goals on Admission    Weight Management Yes    Intervention Weight Management: Develop a combined nutrition and exercise program designed to reach desired caloric intake, while maintaining appropriate intake of nutrient and fiber, sodium and fats, and appropriate energy expenditure required for the weight goal.;Weight Management: Provide education and appropriate resources to help participant work on and attain dietary goals.    Admit Weight 158 lb 4.8 oz (71.8 kg)  Goal Weight: Short Term 155 lb (70.3 kg)    Goal Weight: Long Term 155 lb (70.3 kg)    Expected Outcomes Weight Maintenance: Understanding of the daily nutrition guidelines, which includes 25-35% calories from fat, 7% or less cal from saturated fats, less than 200mg  cholesterol, less than 1.5gm of sodium, & 5 or more servings of fruits and vegetables daily    Hypertension Yes    Intervention Provide education on lifestyle modifcations including regular physical activity/exercise, weight management, moderate sodium restriction and increased consumption of fresh fruit, vegetables, and low fat dairy, alcohol moderation, and smoking cessation.;Monitor prescription use compliance.    Expected Outcomes Long Term: Maintenance of  blood pressure at goal levels.;Short Term: Continued assessment and intervention until BP is < 140/16mm HG in hypertensive participants. < 130/84mm HG in hypertensive participants with diabetes, heart failure or chronic kidney disease.           Education:Diabetes - Individual verbal and written instruction to review signs/symptoms of diabetes, desired ranges of glucose level fasting, after meals and with exercise. Acknowledge that pre and post exercise glucose checks will be done for 3 sessions at entry of program.   Know Your Numbers and Heart Failure: - Group verbal and visual instruction to discuss disease risk factors for cardiac and pulmonary disease and treatment options.  Reviews associated critical values for Overweight/Obesity, Hypertension, Cholesterol, and Diabetes.  Discusses basics of heart failure: signs/symptoms and treatments.  Introduces Heart Failure Zone chart for action plan for heart failure.  Written material given at graduation.   Core Components/Risk Factors/Patient Goals Review:   Goals and Risk Factor Review    Row Name 11/17/19 1553 12/15/19 1552 01/12/20 1555         Core Components/Risk Factors/Patient Goals Review   Personal Goals Review Weight Management/Obesity;Hypertension Weight Management/Obesity;Improve shortness of breath with ADL's;Hypertension Weight Management/Obesity;Improve shortness of breath with ADL's;Hypertension     Review Spoke to patient about their shortness of breath and what they can do to improve. Patient has been informed of breathing techniques when starting the program. Patient is informed to tell staff if they have had any med changes and that certain meds they are taking or not taking can be causing shortness of breath. Jaelyn has had better blood presure reading since she started the program she was high upon entry and is now getting within normal limits. She states that her shortness of breath is improving and she is able to do more at  home. She has no other questions at this time and is willing to work hard in class. Khalidah is maintaining her weight between 156-155 lb.  She has also noted that her legs are no longer swelling as much as they were before.  Her blood pressures have been running lower recently.  Her breathing has greatly improved and she is now able to do more.     Expected Outcomes Short: Attend [facility] regularly to improve shortness of breath with ADL's. Long: maintain independence with ADL's Short: attend [facility] Regularly. Long: graduate [facility]. Continue to keep eye on weight and swelling.            Core Components/Risk Factors/Patient Goals at Discharge (Final Review):   Goals and Risk Factor Review - 01/12/20 1555      Core Components/Risk Factors/Patient Goals Review   Personal Goals Review Weight Management/Obesity;Improve shortness of breath with ADL's;Hypertension    Review Birgitta is maintaining her weight between 156-155 lb.  She has also noted that her legs  are no longer swelling as much as they were before.  Her blood pressures have been running lower recently.  Her breathing has greatly improved and she is now able to do more.    Expected Outcomes Continue to keep eye on weight and swelling.           ITP Comments:  ITP Comments    Row Name 10/28/19 1425 11/01/19 1446 11/02/19 1528 11/09/19 1506 11/10/19 1232   ITP Comments Initial telephone encounter completed. Diagnosis can be found in HCL 7/15. EP orientation scheduled for 8/31 at 1pm. Completed 6MWT and gym orientation. Initial ITP created and sent for review to Dr. [name], Medical Director. First full day of exercise!  Patient was oriented to gym and equipment including functions, settings, policies, and procedures.  Patient's individual exercise prescription and treatment plan were reviewed.  All starting workloads were established based on the results of the 6 minute walk test done at initial orientation visit.  The plan for  exercise progression was also introduced and progression will be customized based on patient's performance and goals 30 day review completed. ITP sent to Dr. [name], Medical Director of Cardiac and Pulmonary Rehab. Continue with ITP unless changes are made by physician. Completed initial RD Evaluation   Row Name 12/07/19 0533 12/07/19 1644 01/04/20 0627 01/23/20 1542     ITP Comments 30 Day review completed. Medical Director ITP review done, changes made as directed, and signed approval by Medical Director. Reviewed home exercise with pt today.  Pt plans to use the recumbant bike for exercise.  Reviewed THR, pulse, RPE, sign and symptoms, pulse oximetery and when to call 911 or MD.  Also discussed weather considerations and indoor options.  Pt voiced understanding. 30 Day review completed. Medical Director ITP review done, changes made as directed, and signed approval by Medical Director. Magaby graduated today from  rehab with 36 sessions completed.  Details of the patient's exercise prescription and what She needs to do in order to continue the prescription and progress were discussed with patient.  Patient was given a copy of prescription and goals.  Patient verbalized understanding.  Victoriya plans to continue to exercise by exercising at the [facility].           Comments: discharge ITP

## 2020-01-23 NOTE — Progress Notes (Signed)
Daily Session Note  Patient Details  Name: Abigail Hill MRN: [id number] Date of Birth: 1951/02/04 Referring Provider:     Pulmonary Rehab from 11/01/2019 in [facility] Cardiac and Pulmonary Rehab  Referring Provider [name]      Encounter Date: 01/23/2020  Check In:  Session Check In - 01/23/20 1533      Check-In   Supervising physician immediately available to respond to emergencies See telemetry face sheet for immediately available ER MD    Location [facility]-Cardiac & Pulmonary Rehab    Staff Present Birdie Sons, MPA, Mauricia Area, BS, ACSM CEP, Exercise Physiologist;Kara Eliezer Bottom, MS Exercise Physiologist    Virtual Visit No    Medication changes reported     No    Fall or balance concerns reported    Yes    Comments missed chair in bathroom but sat on bottom on the floor; not actual fall and does not report any injury    Warm-up and Cool-down Performed on first and last piece of equipment    Resistance Training Performed Yes    VAD Patient? No    PAD/SET Patient? No      Pain Assessment   Currently in Pain? No/denies              Social History   Tobacco Use  Smoking Status Former Smoker  Smokeless Tobacco Never Used  Tobacco Comment   quit 10 years    Goals Met:  Independence with exercise equipment Exercise tolerated well No report of cardiac concerns or symptoms Strength training completed today  Goals Unmet:  Not Applicable  Comments:  [name] graduated today from  rehab with 36 sessions completed.  Details of the patient's exercise prescription and what She needs to do in order to continue the prescription and progress were discussed with patient.  Patient was given a copy of prescription and goals.  Patient verbalized understanding.  [name] plans to continue to exercise by exercising at the [facility].     Dr. [name] is Medical Director for [place] and [facility].

## 2020-01-28 DIAGNOSIS — J449 Chronic obstructive pulmonary disease, unspecified: Secondary | ICD-10-CM | POA: Diagnosis not present

## 2020-02-04 DIAGNOSIS — M5417 Radiculopathy, lumbosacral region: Secondary | ICD-10-CM | POA: Diagnosis not present

## 2020-02-04 DIAGNOSIS — M9905 Segmental and somatic dysfunction of pelvic region: Secondary | ICD-10-CM | POA: Diagnosis not present

## 2020-02-04 DIAGNOSIS — S336XXA Sprain of sacroiliac joint, initial encounter: Secondary | ICD-10-CM | POA: Diagnosis not present

## 2020-02-04 DIAGNOSIS — M9903 Segmental and somatic dysfunction of lumbar region: Secondary | ICD-10-CM | POA: Diagnosis not present

## 2020-02-06 DIAGNOSIS — M9905 Segmental and somatic dysfunction of pelvic region: Secondary | ICD-10-CM | POA: Diagnosis not present

## 2020-02-06 DIAGNOSIS — M9903 Segmental and somatic dysfunction of lumbar region: Secondary | ICD-10-CM | POA: Diagnosis not present

## 2020-02-06 DIAGNOSIS — M5417 Radiculopathy, lumbosacral region: Secondary | ICD-10-CM | POA: Diagnosis not present

## 2020-02-06 DIAGNOSIS — S336XXA Sprain of sacroiliac joint, initial encounter: Secondary | ICD-10-CM | POA: Diagnosis not present

## 2020-02-07 DIAGNOSIS — M9903 Segmental and somatic dysfunction of lumbar region: Secondary | ICD-10-CM | POA: Diagnosis not present

## 2020-02-07 DIAGNOSIS — M5417 Radiculopathy, lumbosacral region: Secondary | ICD-10-CM | POA: Diagnosis not present

## 2020-02-07 DIAGNOSIS — S336XXA Sprain of sacroiliac joint, initial encounter: Secondary | ICD-10-CM | POA: Diagnosis not present

## 2020-02-07 DIAGNOSIS — M9905 Segmental and somatic dysfunction of pelvic region: Secondary | ICD-10-CM | POA: Diagnosis not present

## 2020-02-09 DIAGNOSIS — S336XXA Sprain of sacroiliac joint, initial encounter: Secondary | ICD-10-CM | POA: Diagnosis not present

## 2020-02-09 DIAGNOSIS — M9903 Segmental and somatic dysfunction of lumbar region: Secondary | ICD-10-CM | POA: Diagnosis not present

## 2020-02-09 DIAGNOSIS — M5417 Radiculopathy, lumbosacral region: Secondary | ICD-10-CM | POA: Diagnosis not present

## 2020-02-09 DIAGNOSIS — M9905 Segmental and somatic dysfunction of pelvic region: Secondary | ICD-10-CM | POA: Diagnosis not present

## 2020-02-13 DIAGNOSIS — S336XXA Sprain of sacroiliac joint, initial encounter: Secondary | ICD-10-CM | POA: Diagnosis not present

## 2020-02-13 DIAGNOSIS — M9905 Segmental and somatic dysfunction of pelvic region: Secondary | ICD-10-CM | POA: Diagnosis not present

## 2020-02-13 DIAGNOSIS — M9903 Segmental and somatic dysfunction of lumbar region: Secondary | ICD-10-CM | POA: Diagnosis not present

## 2020-02-13 DIAGNOSIS — M5417 Radiculopathy, lumbosacral region: Secondary | ICD-10-CM | POA: Diagnosis not present

## 2020-02-15 DIAGNOSIS — M9905 Segmental and somatic dysfunction of pelvic region: Secondary | ICD-10-CM | POA: Diagnosis not present

## 2020-02-15 DIAGNOSIS — S336XXA Sprain of sacroiliac joint, initial encounter: Secondary | ICD-10-CM | POA: Diagnosis not present

## 2020-02-15 DIAGNOSIS — M5417 Radiculopathy, lumbosacral region: Secondary | ICD-10-CM | POA: Diagnosis not present

## 2020-02-15 DIAGNOSIS — M9903 Segmental and somatic dysfunction of lumbar region: Secondary | ICD-10-CM | POA: Diagnosis not present

## 2020-02-16 DIAGNOSIS — S336XXA Sprain of sacroiliac joint, initial encounter: Secondary | ICD-10-CM | POA: Diagnosis not present

## 2020-02-16 DIAGNOSIS — M5417 Radiculopathy, lumbosacral region: Secondary | ICD-10-CM | POA: Diagnosis not present

## 2020-02-16 DIAGNOSIS — M9903 Segmental and somatic dysfunction of lumbar region: Secondary | ICD-10-CM | POA: Diagnosis not present

## 2020-02-16 DIAGNOSIS — M9905 Segmental and somatic dysfunction of pelvic region: Secondary | ICD-10-CM | POA: Diagnosis not present

## 2020-02-20 DIAGNOSIS — S336XXA Sprain of sacroiliac joint, initial encounter: Secondary | ICD-10-CM | POA: Diagnosis not present

## 2020-02-20 DIAGNOSIS — M9905 Segmental and somatic dysfunction of pelvic region: Secondary | ICD-10-CM | POA: Diagnosis not present

## 2020-02-20 DIAGNOSIS — M5417 Radiculopathy, lumbosacral region: Secondary | ICD-10-CM | POA: Diagnosis not present

## 2020-02-20 DIAGNOSIS — M9903 Segmental and somatic dysfunction of lumbar region: Secondary | ICD-10-CM | POA: Diagnosis not present

## 2020-02-22 DIAGNOSIS — M9905 Segmental and somatic dysfunction of pelvic region: Secondary | ICD-10-CM | POA: Diagnosis not present

## 2020-02-22 DIAGNOSIS — S336XXA Sprain of sacroiliac joint, initial encounter: Secondary | ICD-10-CM | POA: Diagnosis not present

## 2020-02-22 DIAGNOSIS — M5417 Radiculopathy, lumbosacral region: Secondary | ICD-10-CM | POA: Diagnosis not present

## 2020-02-22 DIAGNOSIS — M9903 Segmental and somatic dysfunction of lumbar region: Secondary | ICD-10-CM | POA: Diagnosis not present

## 2020-02-23 DIAGNOSIS — M9903 Segmental and somatic dysfunction of lumbar region: Secondary | ICD-10-CM | POA: Diagnosis not present

## 2020-02-23 DIAGNOSIS — M9905 Segmental and somatic dysfunction of pelvic region: Secondary | ICD-10-CM | POA: Diagnosis not present

## 2020-02-23 DIAGNOSIS — M5417 Radiculopathy, lumbosacral region: Secondary | ICD-10-CM | POA: Diagnosis not present

## 2020-02-23 DIAGNOSIS — S336XXA Sprain of sacroiliac joint, initial encounter: Secondary | ICD-10-CM | POA: Diagnosis not present

## 2020-02-27 DIAGNOSIS — M5417 Radiculopathy, lumbosacral region: Secondary | ICD-10-CM | POA: Diagnosis not present

## 2020-02-27 DIAGNOSIS — S336XXA Sprain of sacroiliac joint, initial encounter: Secondary | ICD-10-CM | POA: Diagnosis not present

## 2020-02-27 DIAGNOSIS — J449 Chronic obstructive pulmonary disease, unspecified: Secondary | ICD-10-CM | POA: Diagnosis not present

## 2020-02-27 DIAGNOSIS — M9905 Segmental and somatic dysfunction of pelvic region: Secondary | ICD-10-CM | POA: Diagnosis not present

## 2020-02-27 DIAGNOSIS — M9903 Segmental and somatic dysfunction of lumbar region: Secondary | ICD-10-CM | POA: Diagnosis not present

## 2020-03-07 DIAGNOSIS — M5417 Radiculopathy, lumbosacral region: Secondary | ICD-10-CM | POA: Diagnosis not present

## 2020-03-07 DIAGNOSIS — M9903 Segmental and somatic dysfunction of lumbar region: Secondary | ICD-10-CM | POA: Diagnosis not present

## 2020-03-07 DIAGNOSIS — S336XXA Sprain of sacroiliac joint, initial encounter: Secondary | ICD-10-CM | POA: Diagnosis not present

## 2020-03-07 DIAGNOSIS — M9905 Segmental and somatic dysfunction of pelvic region: Secondary | ICD-10-CM | POA: Diagnosis not present

## 2020-03-08 DIAGNOSIS — M9905 Segmental and somatic dysfunction of pelvic region: Secondary | ICD-10-CM | POA: Diagnosis not present

## 2020-03-08 DIAGNOSIS — M5417 Radiculopathy, lumbosacral region: Secondary | ICD-10-CM | POA: Diagnosis not present

## 2020-03-08 DIAGNOSIS — M9903 Segmental and somatic dysfunction of lumbar region: Secondary | ICD-10-CM | POA: Diagnosis not present

## 2020-03-08 DIAGNOSIS — S336XXA Sprain of sacroiliac joint, initial encounter: Secondary | ICD-10-CM | POA: Diagnosis not present

## 2020-03-13 DIAGNOSIS — M5417 Radiculopathy, lumbosacral region: Secondary | ICD-10-CM | POA: Diagnosis not present

## 2020-03-13 DIAGNOSIS — M9905 Segmental and somatic dysfunction of pelvic region: Secondary | ICD-10-CM | POA: Diagnosis not present

## 2020-03-13 DIAGNOSIS — M9903 Segmental and somatic dysfunction of lumbar region: Secondary | ICD-10-CM | POA: Diagnosis not present

## 2020-03-13 DIAGNOSIS — S336XXA Sprain of sacroiliac joint, initial encounter: Secondary | ICD-10-CM | POA: Diagnosis not present

## 2020-03-15 DIAGNOSIS — M5417 Radiculopathy, lumbosacral region: Secondary | ICD-10-CM | POA: Diagnosis not present

## 2020-03-15 DIAGNOSIS — M9905 Segmental and somatic dysfunction of pelvic region: Secondary | ICD-10-CM | POA: Diagnosis not present

## 2020-03-15 DIAGNOSIS — S336XXA Sprain of sacroiliac joint, initial encounter: Secondary | ICD-10-CM | POA: Diagnosis not present

## 2020-03-15 DIAGNOSIS — M9903 Segmental and somatic dysfunction of lumbar region: Secondary | ICD-10-CM | POA: Diagnosis not present

## 2020-03-20 DIAGNOSIS — S336XXA Sprain of sacroiliac joint, initial encounter: Secondary | ICD-10-CM | POA: Diagnosis not present

## 2020-03-20 DIAGNOSIS — M5417 Radiculopathy, lumbosacral region: Secondary | ICD-10-CM | POA: Diagnosis not present

## 2020-03-20 DIAGNOSIS — M9905 Segmental and somatic dysfunction of pelvic region: Secondary | ICD-10-CM | POA: Diagnosis not present

## 2020-03-20 DIAGNOSIS — M9903 Segmental and somatic dysfunction of lumbar region: Secondary | ICD-10-CM | POA: Diagnosis not present

## 2020-03-22 DIAGNOSIS — M9903 Segmental and somatic dysfunction of lumbar region: Secondary | ICD-10-CM | POA: Diagnosis not present

## 2020-03-22 DIAGNOSIS — M5417 Radiculopathy, lumbosacral region: Secondary | ICD-10-CM | POA: Diagnosis not present

## 2020-03-22 DIAGNOSIS — J449 Chronic obstructive pulmonary disease, unspecified: Secondary | ICD-10-CM | POA: Diagnosis not present

## 2020-03-22 DIAGNOSIS — S336XXA Sprain of sacroiliac joint, initial encounter: Secondary | ICD-10-CM | POA: Diagnosis not present

## 2020-03-22 DIAGNOSIS — M9905 Segmental and somatic dysfunction of pelvic region: Secondary | ICD-10-CM | POA: Diagnosis not present

## 2020-03-28 DIAGNOSIS — M9903 Segmental and somatic dysfunction of lumbar region: Secondary | ICD-10-CM | POA: Diagnosis not present

## 2020-03-28 DIAGNOSIS — M9905 Segmental and somatic dysfunction of pelvic region: Secondary | ICD-10-CM | POA: Diagnosis not present

## 2020-03-28 DIAGNOSIS — S336XXA Sprain of sacroiliac joint, initial encounter: Secondary | ICD-10-CM | POA: Diagnosis not present

## 2020-03-28 DIAGNOSIS — M5417 Radiculopathy, lumbosacral region: Secondary | ICD-10-CM | POA: Diagnosis not present

## 2020-03-29 DIAGNOSIS — J449 Chronic obstructive pulmonary disease, unspecified: Secondary | ICD-10-CM | POA: Diagnosis not present

## 2020-03-29 DIAGNOSIS — M5417 Radiculopathy, lumbosacral region: Secondary | ICD-10-CM | POA: Diagnosis not present

## 2020-03-29 DIAGNOSIS — M9905 Segmental and somatic dysfunction of pelvic region: Secondary | ICD-10-CM | POA: Diagnosis not present

## 2020-03-29 DIAGNOSIS — M9903 Segmental and somatic dysfunction of lumbar region: Secondary | ICD-10-CM | POA: Diagnosis not present

## 2020-03-29 DIAGNOSIS — S336XXA Sprain of sacroiliac joint, initial encounter: Secondary | ICD-10-CM | POA: Diagnosis not present

## 2020-04-03 DIAGNOSIS — M5417 Radiculopathy, lumbosacral region: Secondary | ICD-10-CM | POA: Diagnosis not present

## 2020-04-03 DIAGNOSIS — M9903 Segmental and somatic dysfunction of lumbar region: Secondary | ICD-10-CM | POA: Diagnosis not present

## 2020-04-03 DIAGNOSIS — S336XXA Sprain of sacroiliac joint, initial encounter: Secondary | ICD-10-CM | POA: Diagnosis not present

## 2020-04-03 DIAGNOSIS — M9905 Segmental and somatic dysfunction of pelvic region: Secondary | ICD-10-CM | POA: Diagnosis not present

## 2020-04-10 DIAGNOSIS — M5417 Radiculopathy, lumbosacral region: Secondary | ICD-10-CM | POA: Diagnosis not present

## 2020-04-10 DIAGNOSIS — M9905 Segmental and somatic dysfunction of pelvic region: Secondary | ICD-10-CM | POA: Diagnosis not present

## 2020-04-10 DIAGNOSIS — S336XXA Sprain of sacroiliac joint, initial encounter: Secondary | ICD-10-CM | POA: Diagnosis not present

## 2020-04-10 DIAGNOSIS — M9903 Segmental and somatic dysfunction of lumbar region: Secondary | ICD-10-CM | POA: Diagnosis not present

## 2020-04-17 DIAGNOSIS — S336XXA Sprain of sacroiliac joint, initial encounter: Secondary | ICD-10-CM | POA: Diagnosis not present

## 2020-04-17 DIAGNOSIS — M5417 Radiculopathy, lumbosacral region: Secondary | ICD-10-CM | POA: Diagnosis not present

## 2020-04-17 DIAGNOSIS — M9905 Segmental and somatic dysfunction of pelvic region: Secondary | ICD-10-CM | POA: Diagnosis not present

## 2020-04-17 DIAGNOSIS — M9903 Segmental and somatic dysfunction of lumbar region: Secondary | ICD-10-CM | POA: Diagnosis not present

## 2020-04-24 DIAGNOSIS — S336XXA Sprain of sacroiliac joint, initial encounter: Secondary | ICD-10-CM | POA: Diagnosis not present

## 2020-04-24 DIAGNOSIS — M5417 Radiculopathy, lumbosacral region: Secondary | ICD-10-CM | POA: Diagnosis not present

## 2020-04-24 DIAGNOSIS — M9903 Segmental and somatic dysfunction of lumbar region: Secondary | ICD-10-CM | POA: Diagnosis not present

## 2020-04-24 DIAGNOSIS — M9905 Segmental and somatic dysfunction of pelvic region: Secondary | ICD-10-CM | POA: Diagnosis not present

## 2020-04-29 DIAGNOSIS — J449 Chronic obstructive pulmonary disease, unspecified: Secondary | ICD-10-CM | POA: Diagnosis not present

## 2020-05-01 DIAGNOSIS — M5417 Radiculopathy, lumbosacral region: Secondary | ICD-10-CM | POA: Diagnosis not present

## 2020-05-01 DIAGNOSIS — S336XXA Sprain of sacroiliac joint, initial encounter: Secondary | ICD-10-CM | POA: Diagnosis not present

## 2020-05-01 DIAGNOSIS — M9903 Segmental and somatic dysfunction of lumbar region: Secondary | ICD-10-CM | POA: Diagnosis not present

## 2020-05-01 DIAGNOSIS — M9905 Segmental and somatic dysfunction of pelvic region: Secondary | ICD-10-CM | POA: Diagnosis not present

## 2020-05-04 DIAGNOSIS — I10 Essential (primary) hypertension: Secondary | ICD-10-CM | POA: Diagnosis not present

## 2020-05-11 DIAGNOSIS — J9611 Chronic respiratory failure with hypoxia: Secondary | ICD-10-CM | POA: Diagnosis not present

## 2020-05-11 DIAGNOSIS — I639 Cerebral infarction, unspecified: Secondary | ICD-10-CM | POA: Diagnosis not present

## 2020-05-11 DIAGNOSIS — I10 Essential (primary) hypertension: Secondary | ICD-10-CM | POA: Diagnosis not present

## 2020-05-11 DIAGNOSIS — G912 (Idiopathic) normal pressure hydrocephalus: Secondary | ICD-10-CM | POA: Diagnosis not present

## 2020-05-11 DIAGNOSIS — Z0000 Encounter for general adult medical examination without abnormal findings: Secondary | ICD-10-CM | POA: Diagnosis not present

## 2020-05-11 DIAGNOSIS — N1832 Chronic kidney disease, stage 3b: Secondary | ICD-10-CM | POA: Diagnosis not present

## 2020-05-11 DIAGNOSIS — J432 Centrilobular emphysema: Secondary | ICD-10-CM | POA: Diagnosis not present

## 2020-05-15 ENCOUNTER — Other Ambulatory Visit: Payer: Self-pay | Admitting: Orthopaedic Surgery

## 2020-05-22 DIAGNOSIS — M5417 Radiculopathy, lumbosacral region: Secondary | ICD-10-CM | POA: Diagnosis not present

## 2020-05-22 DIAGNOSIS — M9903 Segmental and somatic dysfunction of lumbar region: Secondary | ICD-10-CM | POA: Diagnosis not present

## 2020-05-22 DIAGNOSIS — S336XXA Sprain of sacroiliac joint, initial encounter: Secondary | ICD-10-CM | POA: Diagnosis not present

## 2020-05-22 DIAGNOSIS — M9905 Segmental and somatic dysfunction of pelvic region: Secondary | ICD-10-CM | POA: Diagnosis not present

## 2020-05-27 DIAGNOSIS — J449 Chronic obstructive pulmonary disease, unspecified: Secondary | ICD-10-CM | POA: Diagnosis not present

## 2020-05-31 DIAGNOSIS — Z09 Encounter for follow-up examination after completed treatment for conditions other than malignant neoplasm: Secondary | ICD-10-CM | POA: Diagnosis not present

## 2020-05-31 DIAGNOSIS — Z8709 Personal history of other diseases of the respiratory system: Secondary | ICD-10-CM | POA: Diagnosis not present

## 2020-06-11 DIAGNOSIS — M9905 Segmental and somatic dysfunction of pelvic region: Secondary | ICD-10-CM | POA: Diagnosis not present

## 2020-06-11 DIAGNOSIS — M9903 Segmental and somatic dysfunction of lumbar region: Secondary | ICD-10-CM | POA: Diagnosis not present

## 2020-06-11 DIAGNOSIS — S336XXA Sprain of sacroiliac joint, initial encounter: Secondary | ICD-10-CM | POA: Diagnosis not present

## 2020-06-11 DIAGNOSIS — M5417 Radiculopathy, lumbosacral region: Secondary | ICD-10-CM | POA: Diagnosis not present

## 2020-06-27 DIAGNOSIS — J449 Chronic obstructive pulmonary disease, unspecified: Secondary | ICD-10-CM | POA: Diagnosis not present

## 2020-07-09 DIAGNOSIS — M9905 Segmental and somatic dysfunction of pelvic region: Secondary | ICD-10-CM | POA: Diagnosis not present

## 2020-07-09 DIAGNOSIS — M9903 Segmental and somatic dysfunction of lumbar region: Secondary | ICD-10-CM | POA: Diagnosis not present

## 2020-07-09 DIAGNOSIS — S336XXA Sprain of sacroiliac joint, initial encounter: Secondary | ICD-10-CM | POA: Diagnosis not present

## 2020-07-09 DIAGNOSIS — M5417 Radiculopathy, lumbosacral region: Secondary | ICD-10-CM | POA: Diagnosis not present

## 2020-07-19 ENCOUNTER — Ambulatory Visit
Admission: RE | Admit: 2020-07-19 | Discharge: 2020-07-19 | Disposition: A | Discharge status: Home / Self Care | Payer: PPO | Source: Ambulatory Visit | Attending: Pulmonary Disease | Admitting: Pulmonary Disease

## 2020-07-19 ENCOUNTER — Other Ambulatory Visit: Payer: Self-pay

## 2020-07-19 ENCOUNTER — Other Ambulatory Visit (HOSPITAL_COMMUNITY): Payer: Self-pay | Admitting: Pulmonary Disease

## 2020-07-19 ENCOUNTER — Other Ambulatory Visit: Payer: Self-pay | Admitting: Pulmonary Disease

## 2020-07-19 DIAGNOSIS — R7989 Other specified abnormal findings of blood chemistry: Secondary | ICD-10-CM | POA: Diagnosis not present

## 2020-07-19 DIAGNOSIS — E878 Other disorders of electrolyte and fluid balance, not elsewhere classified: Secondary | ICD-10-CM | POA: Diagnosis not present

## 2020-07-19 DIAGNOSIS — R600 Localized edema: Secondary | ICD-10-CM

## 2020-07-19 DIAGNOSIS — R52 Pain, unspecified: Secondary | ICD-10-CM | POA: Diagnosis not present

## 2020-07-19 DIAGNOSIS — M7122 Synovial cyst of popliteal space [Baker], left knee: Secondary | ICD-10-CM | POA: Diagnosis not present

## 2020-07-19 DIAGNOSIS — M7989 Other specified soft tissue disorders: Secondary | ICD-10-CM | POA: Diagnosis not present

## 2020-07-23 DIAGNOSIS — X32XXXA Exposure to sunlight, initial encounter: Secondary | ICD-10-CM | POA: Diagnosis not present

## 2020-07-23 DIAGNOSIS — D2261 Melanocytic nevi of right upper limb, including shoulder: Secondary | ICD-10-CM | POA: Diagnosis not present

## 2020-07-23 DIAGNOSIS — L570 Actinic keratosis: Secondary | ICD-10-CM | POA: Diagnosis not present

## 2020-07-23 DIAGNOSIS — L218 Other seborrheic dermatitis: Secondary | ICD-10-CM | POA: Diagnosis not present

## 2020-07-23 DIAGNOSIS — D2272 Melanocytic nevi of left lower limb, including hip: Secondary | ICD-10-CM | POA: Diagnosis not present

## 2020-07-23 DIAGNOSIS — D225 Melanocytic nevi of trunk: Secondary | ICD-10-CM | POA: Diagnosis not present

## 2020-07-23 DIAGNOSIS — D2262 Melanocytic nevi of left upper limb, including shoulder: Secondary | ICD-10-CM | POA: Diagnosis not present

## 2020-07-23 DIAGNOSIS — L821 Other seborrheic keratosis: Secondary | ICD-10-CM | POA: Diagnosis not present

## 2020-07-23 DIAGNOSIS — D2271 Melanocytic nevi of right lower limb, including hip: Secondary | ICD-10-CM | POA: Diagnosis not present

## 2020-07-27 DIAGNOSIS — Z01818 Encounter for other preprocedural examination: Secondary | ICD-10-CM | POA: Diagnosis not present

## 2020-07-27 DIAGNOSIS — J432 Centrilobular emphysema: Secondary | ICD-10-CM | POA: Diagnosis not present

## 2020-07-27 DIAGNOSIS — J449 Chronic obstructive pulmonary disease, unspecified: Secondary | ICD-10-CM | POA: Diagnosis not present

## 2020-08-06 DIAGNOSIS — M5417 Radiculopathy, lumbosacral region: Secondary | ICD-10-CM | POA: Diagnosis not present

## 2020-08-06 DIAGNOSIS — M9903 Segmental and somatic dysfunction of lumbar region: Secondary | ICD-10-CM | POA: Diagnosis not present

## 2020-08-06 DIAGNOSIS — S336XXA Sprain of sacroiliac joint, initial encounter: Secondary | ICD-10-CM | POA: Diagnosis not present

## 2020-08-06 DIAGNOSIS — M9905 Segmental and somatic dysfunction of pelvic region: Secondary | ICD-10-CM | POA: Diagnosis not present

## 2020-08-28 DIAGNOSIS — H353131 Nonexudative age-related macular degeneration, bilateral, early dry stage: Secondary | ICD-10-CM | POA: Diagnosis not present

## 2020-08-28 DIAGNOSIS — H2513 Age-related nuclear cataract, bilateral: Secondary | ICD-10-CM | POA: Diagnosis not present

## 2020-08-28 DIAGNOSIS — H5203 Hypermetropia, bilateral: Secondary | ICD-10-CM | POA: Diagnosis not present

## 2020-08-28 DIAGNOSIS — H524 Presbyopia: Secondary | ICD-10-CM | POA: Diagnosis not present

## 2020-08-28 DIAGNOSIS — H52223 Regular astigmatism, bilateral: Secondary | ICD-10-CM | POA: Diagnosis not present

## 2020-08-28 DIAGNOSIS — H31123 Diffuse secondary atrophy of choroid, bilateral: Secondary | ICD-10-CM | POA: Diagnosis not present

## 2020-09-10 DIAGNOSIS — M9905 Segmental and somatic dysfunction of pelvic region: Secondary | ICD-10-CM | POA: Diagnosis not present

## 2020-09-10 DIAGNOSIS — M9903 Segmental and somatic dysfunction of lumbar region: Secondary | ICD-10-CM | POA: Diagnosis not present

## 2020-09-10 DIAGNOSIS — S336XXA Sprain of sacroiliac joint, initial encounter: Secondary | ICD-10-CM | POA: Diagnosis not present

## 2020-09-10 DIAGNOSIS — M5417 Radiculopathy, lumbosacral region: Secondary | ICD-10-CM | POA: Diagnosis not present

## 2020-09-11 ENCOUNTER — Encounter: Payer: Self-pay | Admitting: Urology

## 2020-09-11 ENCOUNTER — Other Ambulatory Visit: Payer: Self-pay

## 2020-09-11 ENCOUNTER — Ambulatory Visit: Payer: PPO | Admitting: Urology

## 2020-09-11 VITALS — BP 152/86 | HR 90 | Ht 61.25 in | Wt 158.0 lb

## 2020-09-11 DIAGNOSIS — N135 Crossing vessel and stricture of ureter without hydronephrosis: Secondary | ICD-10-CM | POA: Diagnosis not present

## 2020-09-11 DIAGNOSIS — N3941 Urge incontinence: Secondary | ICD-10-CM

## 2020-09-11 LAB — BLADDER SCAN AMB NON-IMAGING: Scan Result: 0

## 2020-09-11 NOTE — Progress Notes (Signed)
09/11/2020 1:19 PM   Abigail Hill Oct 22, 1950 [id number]  Referring provider: Baxter Hire, MD [place],  [place] [address]  Chief Complaint  Patient presents with   Urinary Incontinence    HPI: Abigail Hill is a 70 y.o. female with chronic left UPJ obstruction with severe left hydronephrosis as well as urge incontinence on Myrbetriq who is seen for a 1 year follow up.    She is actually had a great year this year.  Has had no new medical issues.  She and her husband have actually been able to travel some which has been fine.  She denies any flank pain, dysuria, UTIs.  Myrbetriq 50 mg is controlling her urinary symptoms.  She will occasionally have some urinary urgency frequency and very rare urge incontinence but is doing much better on this medication.  She will try to plan out where her bathroom breaks are on long trips but otherwise is able to do the things that she wants to do finally.  PVR today is minimal.   Patient has elected for conservative management of her chronic UPJ obstruction.  Creatinine remains stable/normal, most recently 1.1 on 05/04/2020.   PMH: Past Medical History:  Diagnosis Date   Abnormal gait 12/18/2015   BP (high blood pressure) 08/24/2015   Cancer ([place])    skin   Constriction of ureter (postoperative) 07/28/2015   Hydronephrosis 07/14/2015   Hypertension    Left foot drop 12/18/2015   Recurrent UTI    UPJ (ureteropelvic junction) obstruction 07/28/2015   UTI (lower urinary tract infection) 07/14/2015    Surgical History: Past Surgical History:  Procedure Laterality Date   broken foot  2012   COLONOSCOPY WITH PROPOFOL N/A 06/30/2017   Procedure: COLONOSCOPY WITH PROPOFOL;  Surgeon: Lollie Sails, MD;  Location: [facility] ENDOSCOPY;  Service: Endoscopy;  Laterality: N/A;   kidney repair  1985   repaired torn blood vessel in kidney    Home Medications:  Allergies as of 09/11/2020       Reactions   Oxycodone          Medication List        Accurate as of September 11, 2020  1:19 PM. If you have any questions, ask your nurse or doctor.          STOP taking these medications    albuterol (2.5 MG/3ML) 0.083% nebulizer solution Commonly known as: PROVENTIL Stopped by: Hollice Espy, MD   albuterol 108 (90 Base) MCG/ACT inhaler Commonly known as: VENTOLIN HFA Stopped by: Hollice Espy, MD   Anoro Ellipta 62.5-25 MCG/INH Aepb Generic drug: umeclidinium-vilanterol Stopped by: Hollice Espy, MD   ketoconazole 2 % shampoo Commonly known as: NIZORAL Stopped by: Hollice Espy, MD   nystatin-triamcinolone ointment Commonly known as: MYCOLOG Stopped by: Hollice Espy, MD       TAKE these medications    alendronate 70 MG tablet Commonly known as: FOSAMAX Take 70 mg by mouth every Monday.   aspirin 81 MG chewable tablet Chew 81 mg by mouth daily.   CENTRUM SILVER PO Take 1 tablet by mouth daily.   furosemide 20 MG tablet Commonly known as: LASIX Take 1 tablet (20 mg total) by mouth 2 (two) times daily.   mirabegron ER 50 MG Tb24 tablet Commonly known as: MYRBETRIQ Take 1 tablet (50 mg total) by mouth daily.   Oyster Shell Calcium 500-400 MG-UNIT Tabs Take 1 tablet by mouth daily.   torsemide 20 MG tablet Commonly known  as: DEMADEX Take 20 mg by mouth daily.        Allergies:  Allergies  Allergen Reactions   Oxycodone     Family History: Family History  Problem Relation Age of Onset   Kidney disease Neg Hx    Bladder Cancer Neg Hx     Social History:  reports that she has quit smoking. She has never used smokeless tobacco. She reports that she does not drink alcohol and does not use drugs.   Physical Exam: BP (!) 152/86   Pulse 90   Ht 5' 1.25" (1.556 m)   Wt 158 lb (71.7 kg)   BMI 29.61 kg/m   Constitutional:  Alert and oriented, No acute distress.  In wheelchair today accompanied by her husband. HEENT: [place] AT, moist mucus membranes.  Trachea midline, no  masses. Cardiovascular: No clubbing, cyanosis, or edema. Respiratory: Normal respiratory effort, no increased work of breathing. Skin: No rashes, bruises or suspicious lesions. Neurologic: Grossly intact, no focal deficits, moving all 4 extremities. Psychiatric: Normal mood and affect.  Laboratory Data: Care every reviewed as above   Pertinent Imaging: Results for orders placed or performed in visit on 09/11/20  Bladder Scan (Post Void Residual) in office  Result Value Ref Range   Scan Result 0      Assessment & Plan:    1. Urge incontinence Symptoms well controlled on Myrbetriq, will continue this 50 dose Adequate emptying today Refill x1 year - Bladder Scan (Post Void Residual) in office  2. UPJ (ureteropelvic junction) obstruction Chronic right UPJ obstruction who has elected conservative management.   Creatinine remains unremarkable and she is otherwise asymptomatic.    Follow-up in 1 year or sooner if urinary symptoms worsen  Hollice Espy, MD  [facility] [address] [place], [place] [address] [phone]

## 2020-09-17 DIAGNOSIS — J42 Unspecified chronic bronchitis: Secondary | ICD-10-CM | POA: Diagnosis not present

## 2020-09-26 ENCOUNTER — Other Ambulatory Visit: Payer: Self-pay | Admitting: Urology

## 2020-10-08 DIAGNOSIS — M9903 Segmental and somatic dysfunction of lumbar region: Secondary | ICD-10-CM | POA: Diagnosis not present

## 2020-10-08 DIAGNOSIS — M9905 Segmental and somatic dysfunction of pelvic region: Secondary | ICD-10-CM | POA: Diagnosis not present

## 2020-10-08 DIAGNOSIS — S336XXA Sprain of sacroiliac joint, initial encounter: Secondary | ICD-10-CM | POA: Diagnosis not present

## 2020-10-08 DIAGNOSIS — M5417 Radiculopathy, lumbosacral region: Secondary | ICD-10-CM | POA: Diagnosis not present

## 2020-10-26 DIAGNOSIS — J449 Chronic obstructive pulmonary disease, unspecified: Secondary | ICD-10-CM | POA: Diagnosis not present

## 2020-10-27 DIAGNOSIS — J449 Chronic obstructive pulmonary disease, unspecified: Secondary | ICD-10-CM | POA: Diagnosis not present

## 2020-11-06 DIAGNOSIS — M9905 Segmental and somatic dysfunction of pelvic region: Secondary | ICD-10-CM | POA: Diagnosis not present

## 2020-11-06 DIAGNOSIS — M9903 Segmental and somatic dysfunction of lumbar region: Secondary | ICD-10-CM | POA: Diagnosis not present

## 2020-11-06 DIAGNOSIS — S336XXA Sprain of sacroiliac joint, initial encounter: Secondary | ICD-10-CM | POA: Diagnosis not present

## 2020-11-06 DIAGNOSIS — M5417 Radiculopathy, lumbosacral region: Secondary | ICD-10-CM | POA: Diagnosis not present

## 2020-11-06 DIAGNOSIS — I10 Essential (primary) hypertension: Secondary | ICD-10-CM | POA: Diagnosis not present

## 2020-11-06 DIAGNOSIS — N1832 Chronic kidney disease, stage 3b: Secondary | ICD-10-CM | POA: Diagnosis not present

## 2020-11-07 DIAGNOSIS — R8290 Unspecified abnormal findings in urine: Secondary | ICD-10-CM | POA: Diagnosis not present

## 2020-11-13 DIAGNOSIS — J432 Centrilobular emphysema: Secondary | ICD-10-CM | POA: Diagnosis not present

## 2020-11-13 DIAGNOSIS — Z0001 Encounter for general adult medical examination with abnormal findings: Secondary | ICD-10-CM | POA: Diagnosis not present

## 2020-11-13 DIAGNOSIS — I10 Essential (primary) hypertension: Secondary | ICD-10-CM | POA: Diagnosis not present

## 2020-11-13 DIAGNOSIS — Z1231 Encounter for screening mammogram for malignant neoplasm of breast: Secondary | ICD-10-CM | POA: Diagnosis not present

## 2020-11-13 DIAGNOSIS — J9611 Chronic respiratory failure with hypoxia: Secondary | ICD-10-CM | POA: Diagnosis not present

## 2020-11-13 DIAGNOSIS — N1832 Chronic kidney disease, stage 3b: Secondary | ICD-10-CM | POA: Diagnosis not present

## 2020-11-13 DIAGNOSIS — G912 (Idiopathic) normal pressure hydrocephalus: Secondary | ICD-10-CM | POA: Diagnosis not present

## 2020-11-13 DIAGNOSIS — I639 Cerebral infarction, unspecified: Secondary | ICD-10-CM | POA: Diagnosis not present

## 2020-11-13 DIAGNOSIS — Z23 Encounter for immunization: Secondary | ICD-10-CM | POA: Diagnosis not present

## 2020-11-26 DIAGNOSIS — J449 Chronic obstructive pulmonary disease, unspecified: Secondary | ICD-10-CM | POA: Diagnosis not present

## 2020-11-28 DIAGNOSIS — R399 Unspecified symptoms and signs involving the genitourinary system: Secondary | ICD-10-CM | POA: Diagnosis not present

## 2020-12-04 DIAGNOSIS — M5417 Radiculopathy, lumbosacral region: Secondary | ICD-10-CM | POA: Diagnosis not present

## 2020-12-04 DIAGNOSIS — M9903 Segmental and somatic dysfunction of lumbar region: Secondary | ICD-10-CM | POA: Diagnosis not present

## 2020-12-04 DIAGNOSIS — S336XXA Sprain of sacroiliac joint, initial encounter: Secondary | ICD-10-CM | POA: Diagnosis not present

## 2020-12-04 DIAGNOSIS — M9905 Segmental and somatic dysfunction of pelvic region: Secondary | ICD-10-CM | POA: Diagnosis not present

## 2020-12-26 DIAGNOSIS — J449 Chronic obstructive pulmonary disease, unspecified: Secondary | ICD-10-CM | POA: Diagnosis not present

## 2021-01-01 DIAGNOSIS — M5417 Radiculopathy, lumbosacral region: Secondary | ICD-10-CM | POA: Diagnosis not present

## 2021-01-01 DIAGNOSIS — S336XXA Sprain of sacroiliac joint, initial encounter: Secondary | ICD-10-CM | POA: Diagnosis not present

## 2021-01-01 DIAGNOSIS — M9905 Segmental and somatic dysfunction of pelvic region: Secondary | ICD-10-CM | POA: Diagnosis not present

## 2021-01-01 DIAGNOSIS — M9903 Segmental and somatic dysfunction of lumbar region: Secondary | ICD-10-CM | POA: Diagnosis not present

## 2021-01-25 DIAGNOSIS — N390 Urinary tract infection, site not specified: Secondary | ICD-10-CM | POA: Diagnosis not present

## 2021-01-26 DIAGNOSIS — J449 Chronic obstructive pulmonary disease, unspecified: Secondary | ICD-10-CM | POA: Diagnosis not present

## 2021-01-31 DIAGNOSIS — Z09 Encounter for follow-up examination after completed treatment for conditions other than malignant neoplasm: Secondary | ICD-10-CM | POA: Diagnosis not present

## 2021-01-31 DIAGNOSIS — Z87440 Personal history of urinary (tract) infections: Secondary | ICD-10-CM | POA: Diagnosis not present

## 2021-02-05 DIAGNOSIS — S336XXA Sprain of sacroiliac joint, initial encounter: Secondary | ICD-10-CM | POA: Diagnosis not present

## 2021-02-05 DIAGNOSIS — M5417 Radiculopathy, lumbosacral region: Secondary | ICD-10-CM | POA: Diagnosis not present

## 2021-02-05 DIAGNOSIS — M9905 Segmental and somatic dysfunction of pelvic region: Secondary | ICD-10-CM | POA: Diagnosis not present

## 2021-02-05 DIAGNOSIS — M9903 Segmental and somatic dysfunction of lumbar region: Secondary | ICD-10-CM | POA: Diagnosis not present

## 2021-02-05 NOTE — Progress Notes (Signed)
02/06/21 3:01 PM   [name] 20-Sep-1950 [id number]  Referring provider:  Baxter Hire, MD [place],  [place] [address] Chief Complaint  Patient presents with   Recurrent UTI     HPI: Abigail Hill is a 70 y.o.female with a personal history of chronic left UPJ obstruction with severe left hydronephrosis and urge incontinence, who presents today for further evaluation of UTIs in the past 4 months.   Patient has elected for conservative management of her chronic UPJ obstruction.  Creatinine remains stable/normal, most recently 1.1 on 05/04/2020.  She has had  two documented UTIs in September. On 11/07/2020 urine culture grew Klebsiella pneumoniae that was ampicillin resistant. On 11/28/2020 urine culture grew E.Coli.   She is managed on Myrbetriq for OAB.   Her last upper tract imaging was in the form of RUS in 2020.   Urine today was negative for infection.   She reports that she had a 3 infection after Thanksgiving. She was treated by home health [facility]. Her symptoms consisted of cloudy urine and burning. She was treated with cephalexin.   Today, she has no urinary symptoms other than her baseline OAB which is doing well on meds as outlined above.  No dysuria or gross hematuria.  PMH: Past Medical History:  Diagnosis Date   Abnormal gait 12/18/2015   BP (high blood pressure) 08/24/2015   Cancer ([place])    skin   Constriction of ureter (postoperative) 07/28/2015   Hydronephrosis 07/14/2015   Hypertension    Left foot drop 12/18/2015   Recurrent UTI    UPJ (ureteropelvic junction) obstruction 07/28/2015   UTI (lower urinary tract infection) 07/14/2015    Surgical History: Past Surgical History:  Procedure Laterality Date   broken foot  2012   COLONOSCOPY WITH PROPOFOL N/A 06/30/2017   Procedure: COLONOSCOPY WITH PROPOFOL;  Surgeon: Lollie Sails, MD;  Location: [facility] ENDOSCOPY;  Service: Endoscopy;  Laterality: N/A;   kidney repair  1985    repaired torn blood vessel in kidney    Home Medications:  Allergies as of 02/06/2021       Reactions   Oxycodone         Medication List        Accurate as of February 06, 2021  3:01 PM. If you have any questions, ask your nurse or doctor.          STOP taking these medications    torsemide 20 MG tablet Commonly known as: DEMADEX Stopped by: Hollice Espy, MD       TAKE these medications    alendronate 70 MG tablet Commonly known as: FOSAMAX Take 70 mg by mouth every Monday.   aspirin 81 MG chewable tablet Chew 81 mg by mouth daily.   CENTRUM SILVER PO Take 1 tablet by mouth daily.   furosemide 20 MG tablet Commonly known as: LASIX Take 1 tablet (20 mg total) by mouth 2 (two) times daily.   Myrbetriq 50 MG Tb24 tablet Generic drug: mirabegron ER TAKE 1 TABLET BY MOUTH EVERY DAY   Oyster Shell Calcium 500-400 MG-UNIT Tabs Take 1 tablet by mouth daily.        Allergies:  Allergies  Allergen Reactions   Oxycodone     Family History: Family History  Problem Relation Age of Onset   Kidney disease Neg Hx    Bladder Cancer Neg Hx     Social History:  reports that she has quit smoking. She has never  used smokeless tobacco. She reports that she does not drink alcohol and does not use drugs.   Physical Exam: BP (!) 161/83   Pulse 97   Ht 5' 1.25" (1.556 m)   Wt 158 lb (71.7 kg)   BMI 29.61 kg/m   Constitutional:  Alert and oriented, No acute distress. HEENT: [place] AT, moist mucus membranes.  Trachea midline, no masses. Cardiovascular: No clubbing, cyanosis, or edema. Respiratory: Normal respiratory effort, no increased work of breathing. Skin: No rashes, bruises or suspicious lesions. Neurologic: Grossly intact, no focal deficits, moving all 4 extremities. Psychiatric: Normal mood and affect.  Laboratory Data:  Lab Results  Component Value Date   CREATININE 0.87 05/19/2019   Urinalysis - Urine today negative for infection    Assessment & Plan:    Recurrent UTI  - Urine today was negative for infection  -  Counseled her in UTI prevention supplements such as cranberry tablets, probiotics, yogurt that has active lactobacillus culture, and d-mannose. Recommend her using these daily - She will return to [facility] health urology if she experiences worsening or UTI symptoms; will consider the addition of topical estrogen cream if she continues to have recurrent issues  Return for annual appointment in July 2023  I,Kailey Littlejohn,acting as a Education administrator for Hollice Espy, MD.,have documented all relevant documentation on the behalf of Hollice Espy, MD,as directed by  Hollice Espy, MD while in the presence of Hollice Espy, MD.  I have reviewed the above documentation for accuracy and completeness, and I agree with the above.   Hollice Espy, MD   [facility] [address] [place], [place] [address] [phone]

## 2021-02-06 ENCOUNTER — Encounter: Payer: Self-pay | Admitting: Urology

## 2021-02-06 ENCOUNTER — Other Ambulatory Visit: Payer: Self-pay

## 2021-02-06 ENCOUNTER — Ambulatory Visit: Payer: PPO | Admitting: Urology

## 2021-02-06 VITALS — BP 161/83 | HR 97 | Ht 61.25 in | Wt 158.0 lb

## 2021-02-06 DIAGNOSIS — N390 Urinary tract infection, site not specified: Secondary | ICD-10-CM | POA: Diagnosis not present

## 2021-02-06 DIAGNOSIS — N3941 Urge incontinence: Secondary | ICD-10-CM

## 2021-02-06 NOTE — Progress Notes (Signed)
In and Out Catheterization  Patient is present today for a I & O catheterization due to UA. Patient was cleaned and prepped in a sterile fashion with betadine . A 14FR cath was inserted no complications were noted , 144ml of urine return was noted, urine was yellow in color. A clean urine sample was collected for UA. Bladder was drained  And catheter was removed with out difficulty.    Performed by: Verlene Mayer, [place], RMA

## 2021-02-06 NOTE — Patient Instructions (Signed)
Get over the counter cranberry tablets. Probiotics as directed. D-Mannose as directed.

## 2021-02-07 DIAGNOSIS — L538 Other specified erythematous conditions: Secondary | ICD-10-CM | POA: Diagnosis not present

## 2021-02-07 DIAGNOSIS — R208 Other disturbances of skin sensation: Secondary | ICD-10-CM | POA: Diagnosis not present

## 2021-02-07 DIAGNOSIS — L820 Inflamed seborrheic keratosis: Secondary | ICD-10-CM | POA: Diagnosis not present

## 2021-02-07 LAB — MICROSCOPIC EXAMINATION
Bacteria, UA: NONE SEEN
RBC, Urine: NONE SEEN /hpf (ref 0–2)

## 2021-02-07 LAB — URINALYSIS, COMPLETE
Bilirubin, UA: NEGATIVE
Glucose, UA: NEGATIVE
Ketones, UA: NEGATIVE
Leukocytes,UA: NEGATIVE
Nitrite, UA: NEGATIVE
Protein,UA: NEGATIVE
RBC, UA: NEGATIVE
Specific Gravity, UA: 1.015 (ref 1.005–1.030)
Urobilinogen, Ur: 0.2 mg/dL (ref 0.2–1.0)
pH, UA: 7.5 (ref 5.0–7.5)

## 2021-02-14 DIAGNOSIS — F0390 Unspecified dementia without behavioral disturbance: Secondary | ICD-10-CM | POA: Diagnosis not present

## 2021-02-14 DIAGNOSIS — J449 Chronic obstructive pulmonary disease, unspecified: Secondary | ICD-10-CM | POA: Diagnosis not present

## 2021-02-22 DIAGNOSIS — N390 Urinary tract infection, site not specified: Secondary | ICD-10-CM | POA: Diagnosis not present

## 2021-02-25 DIAGNOSIS — J449 Chronic obstructive pulmonary disease, unspecified: Secondary | ICD-10-CM | POA: Diagnosis not present

## 2021-03-01 DIAGNOSIS — Z09 Encounter for follow-up examination after completed treatment for conditions other than malignant neoplasm: Secondary | ICD-10-CM | POA: Diagnosis not present

## 2021-03-01 DIAGNOSIS — Z87440 Personal history of urinary (tract) infections: Secondary | ICD-10-CM | POA: Diagnosis not present

## 2021-03-06 DIAGNOSIS — R410 Disorientation, unspecified: Secondary | ICD-10-CM | POA: Diagnosis not present

## 2021-03-06 DIAGNOSIS — R399 Unspecified symptoms and signs involving the genitourinary system: Secondary | ICD-10-CM | POA: Diagnosis not present

## 2021-03-11 DIAGNOSIS — B349 Viral infection, unspecified: Secondary | ICD-10-CM | POA: Diagnosis not present

## 2021-03-14 DIAGNOSIS — R399 Unspecified symptoms and signs involving the genitourinary system: Secondary | ICD-10-CM | POA: Diagnosis not present

## 2021-03-19 DIAGNOSIS — B349 Viral infection, unspecified: Secondary | ICD-10-CM | POA: Diagnosis not present

## 2021-03-19 DIAGNOSIS — Z8616 Personal history of COVID-19: Secondary | ICD-10-CM | POA: Diagnosis not present

## 2021-03-26 DIAGNOSIS — M9903 Segmental and somatic dysfunction of lumbar region: Secondary | ICD-10-CM | POA: Diagnosis not present

## 2021-03-26 DIAGNOSIS — M5417 Radiculopathy, lumbosacral region: Secondary | ICD-10-CM | POA: Diagnosis not present

## 2021-03-26 DIAGNOSIS — M9905 Segmental and somatic dysfunction of pelvic region: Secondary | ICD-10-CM | POA: Diagnosis not present

## 2021-03-26 DIAGNOSIS — M5136 Other intervertebral disc degeneration, lumbar region: Secondary | ICD-10-CM | POA: Diagnosis not present

## 2021-03-27 DIAGNOSIS — R7989 Other specified abnormal findings of blood chemistry: Secondary | ICD-10-CM | POA: Diagnosis not present

## 2021-03-27 DIAGNOSIS — R7981 Abnormal blood-gas level: Secondary | ICD-10-CM | POA: Diagnosis not present

## 2021-03-28 DIAGNOSIS — J449 Chronic obstructive pulmonary disease, unspecified: Secondary | ICD-10-CM | POA: Diagnosis not present

## 2021-04-01 NOTE — Progress Notes (Signed)
04/02/21 1:14 PM   [name] January 15, 1951 [id number]  Referring provider:  Baxter Hire, MD [place],  [place] [id number] Chief Complaint  Patient presents with   Recurrent UTI     HPI: Abigail Hill is a 71 y.o.female with a personal history of chronic left UPJ obstruction with severe left hydronephrosis and urge incontinence, who presents today for further evaluation of UTI.   Patient has elected for conservative management of her chronic UPJ obstruction.  Creatinine remains stable/normal, most recently 1.1 on 05/04/2020.   She has had  two documented UTIs in September. On 11/07/2020 urine culture grew Klebsiella pneumoniae that was ampicillin resistant. On 11/28/2020 urine culture grew E.Coli.    She had a positive urine culture that grew Klebsiella pneumoniae on 03/06/2021. Her most recent urinalysis on 03/14/2021 showed moderate blood, large leukocytes, >20 WBCs, >20 RBCs, and many bacteria. Urine culture grew Klebsiella pneumoniae that was Ampicillin resistant.   She will need an non sterile hat to give her samples.   She reports that she does not feel that she has an infection today.  She was catheterized for specimen today which is negative.   PMH: Past Medical History:  Diagnosis Date   Abnormal gait 12/18/2015   BP (high blood pressure) 08/24/2015   Cancer ([place])    skin   Constriction of ureter (postoperative) 07/28/2015   Hydronephrosis 07/14/2015   Hypertension    Left foot drop 12/18/2015   Recurrent UTI    UPJ (ureteropelvic junction) obstruction 07/28/2015   UTI (lower urinary tract infection) 07/14/2015    Surgical History: Past Surgical History:  Procedure Laterality Date   broken foot  2012   COLONOSCOPY WITH PROPOFOL N/A 06/30/2017   Procedure: COLONOSCOPY WITH PROPOFOL;  Surgeon: Lollie Sails, MD;  Location: [facility] ENDOSCOPY;  Service: Endoscopy;  Laterality: N/A;   kidney repair  1985   repaired torn blood vessel in kidney     Home Medications:  Allergies as of 04/02/2021       Reactions   Oxycodone         Medication List        Accurate as of April 02, 2021  1:14 PM. If you have any questions, ask your nurse or doctor.          alendronate 70 MG tablet Commonly known as: FOSAMAX Take 70 mg by mouth every Monday.   aspirin 81 MG chewable tablet Chew 81 mg by mouth daily.   CENTRUM SILVER PO Take 1 tablet by mouth daily.   estradiol 0.1 MG/GM vaginal cream Commonly known as: ESTRACE Place 1 Applicatorful vaginally at bedtime. Estrogen Cream Instruction Discard applicator Apply pea sized amount to tip of finger to urethra before bed. Wash hands well after application. Use everyday once a day for the first two weeks. Then 3 times a week. Started by: Hollice Espy, MD   furosemide 20 MG tablet Commonly known as: LASIX Take 1 tablet (20 mg total) by mouth 2 (two) times daily.   Myrbetriq 50 MG Tb24 tablet Generic drug: mirabegron ER TAKE 1 TABLET BY MOUTH EVERY DAY   Oyster Shell Calcium 500-400 MG-UNIT Tabs Take 1 tablet by mouth daily.   spironolactone 50 MG tablet Commonly known as: ALDACTONE Take 50 mg by mouth every other day.        Allergies:  Allergies  Allergen Reactions   Oxycodone     Family History: Family History  Problem Relation Age of Onset  Kidney disease Neg Hx    Bladder Cancer Neg Hx     Social History:  reports that she has quit smoking. She has never used smokeless tobacco. She reports that she does not drink alcohol and does not use drugs.   Physical Exam: BP (!) 179/95    Pulse 89    Ht 5\' 4"  (1.626 m)    Wt 153 lb (69.4 kg)    BMI 26.26 kg/m   Constitutional:  Alert and oriented, No acute distress. HEENT: [place] AT, moist mucus membranes.  Trachea midline, no masses. Cardiovascular: No clubbing, cyanosis, or edema. Respiratory: Normal respiratory effort, no increased work of breathing. Skin: No rashes, bruises or suspicious  lesions. Neurologic: Grossly intact, no focal deficits, moving all 4 extremities. Psychiatric: Normal mood and affect.  Laboratory Data:  Lab Results  Component Value Date   CREATININE 0.87 05/19/2019    Urinalysis Catheterized urine specimen, negative today.  Assessment & Plan:    Recurrent UTI  -  Counseled her in UTI prevention supplements such as cranberry tablets, probiotics, yogurt that has active lactobacillus culture, and d-mannose; so far she is only implemented the cranberry tablets that she forgot the name of the other 2 supplements.  I wrote this out for her again today. - Recommend vaginal estrogen cream, discussed using a pea-sized amount per urethral meatus 3 times a week in the beginning and then transitioning to Monday Wednesday Friday.  We discussed how to apply in the anatomy associated with this.  All of her questions were answered. - Estrogen cream; prescribed.  -Advised to return if she has any signs or symptoms of UTI, will require catheterized specimen due to mobility issues   Return for annual appointment in July 2023.   Conley Rolls as a Education administrator for Hollice Espy, MD.,have documented all relevant documentation on the behalf of Hollice Espy, MD,as directed by  Hollice Espy, MD while in the presence of Hollice Espy, MD.  I have reviewed the above documentation for accuracy and completeness, and I agree with the above.   Hollice Espy, MD  [facility] [address] [place], [place] [address] [phone]

## 2021-04-02 ENCOUNTER — Ambulatory Visit: Payer: PPO | Admitting: Urology

## 2021-04-02 ENCOUNTER — Other Ambulatory Visit: Payer: Self-pay

## 2021-04-02 VITALS — BP 179/95 | HR 89 | Ht 64.0 in | Wt 153.0 lb

## 2021-04-02 DIAGNOSIS — N390 Urinary tract infection, site not specified: Secondary | ICD-10-CM

## 2021-04-02 LAB — URINALYSIS, COMPLETE
Bilirubin, UA: NEGATIVE
Glucose, UA: NEGATIVE
Ketones, UA: NEGATIVE
Leukocytes,UA: NEGATIVE
Nitrite, UA: NEGATIVE
Protein,UA: NEGATIVE
RBC, UA: NEGATIVE
Specific Gravity, UA: 1.01 (ref 1.005–1.030)
Urobilinogen, Ur: 0.2 mg/dL (ref 0.2–1.0)
pH, UA: 7 (ref 5.0–7.5)

## 2021-04-02 LAB — MICROSCOPIC EXAMINATION
Bacteria, UA: NONE SEEN
RBC, Urine: NONE SEEN /hpf (ref 0–2)

## 2021-04-02 MED ORDER — ESTRADIOL 0.1 MG/GM VA CREA: 1.0000 | TOPICAL_CREAM | Freq: Every day | VAGINAL | 12 refills | Status: DC

## 2021-04-02 NOTE — Patient Instructions (Signed)
D-Mannose as directed. Probiotics as directed.

## 2021-04-16 ENCOUNTER — Ambulatory Visit: Payer: PPO | Admitting: Urology

## 2021-04-16 ENCOUNTER — Other Ambulatory Visit: Payer: Self-pay | Admitting: Urology

## 2021-04-16 ENCOUNTER — Other Ambulatory Visit: Payer: Self-pay

## 2021-04-16 VITALS — BP 136/81 | HR 93 | Ht 64.0 in | Wt 153.0 lb

## 2021-04-16 DIAGNOSIS — N390 Urinary tract infection, site not specified: Secondary | ICD-10-CM | POA: Diagnosis not present

## 2021-04-16 LAB — MICROSCOPIC EXAMINATION: WBC, UA: >30 /HPF — AB (ref 0–5)

## 2021-04-16 LAB — URINALYSIS, COMPLETE
Bilirubin, UA: NEGATIVE
Glucose, UA: NEGATIVE
Ketones, UA: NEGATIVE
Nitrite, UA: NEGATIVE
Specific Gravity, UA: 1.025 (ref 1.005–1.030)
Urobilinogen, Ur: 0.2 mg/dL (ref 0.2–1.0)
pH, UA: 6 (ref 5.0–7.5)

## 2021-04-16 MED ORDER — TRIMETHOPRIM 100 MG PO TABS: 100.0000 mg | ORAL_TABLET | Freq: Every day | ORAL | 0 refills | Status: DC

## 2021-04-16 MED ORDER — SULFAMETHOXAZOLE-TRIMETHOPRIM 800-160 MG PO TABS: 1.0000 | ORAL_TABLET | Freq: Two times a day (BID) | ORAL | 0 refills | Status: DC

## 2021-04-16 NOTE — Progress Notes (Signed)
04/16/21 2:40 PM   Abigail Hill August 26, 1950 [id number]  Referring provider:  Baxter Hire, MD [place],  [place] [address] Chief Complaint  Patient presents with   Recurrent UTI     HPI: Abigail Hill is a 71 y.o.female  with a personal history of chronic left UPJ obstruction with severe left hydronephrosis and urge incontinence, who presents today for further evaluation of UTI.   Patient has elected for conservative management of her chronic UPJ obstruction.  Creatinine remains stable/normal, most recently 1.1 on 05/04/2020.   She has had  two documented UTIs in September. On 11/07/2020 urine culture grew Klebsiella pneumoniae that was ampicillin resistant. On 11/28/2020 urine culture grew E.Coli.    She had a positive urine culture that grew Klebsiella pneumoniae on 03/06/2021. Her most recent urinalysis on 03/14/2021 showed moderate blood, large leukocytes, >20 WBCs, >20 RBCs, and many bacteria. Urine culture grew Klebsiella pneumoniae that was Ampicillin resistant.   She was prescribed estrogen cream on 04/02/2021  She reports that her symptoms started today.  They include burning and frequency.  PMH: Past Medical History:  Diagnosis Date   Abnormal gait 12/18/2015   BP (high blood pressure) 08/24/2015   Cancer ([place])    skin   Constriction of ureter (postoperative) 07/28/2015   Hydronephrosis 07/14/2015   Hypertension    Left foot drop 12/18/2015   Recurrent UTI    UPJ (ureteropelvic junction) obstruction 07/28/2015   UTI (lower urinary tract infection) 07/14/2015    Surgical History: Past Surgical History:  Procedure Laterality Date   broken foot  2012   COLONOSCOPY WITH PROPOFOL N/A 06/30/2017   Procedure: COLONOSCOPY WITH PROPOFOL;  Surgeon: Lollie Sails, MD;  Location: [facility] ENDOSCOPY;  Service: Endoscopy;  Laterality: N/A;   kidney repair  1985   repaired torn blood vessel in kidney    Home Medications:  Allergies as of 04/16/2021        Reactions   Oxycodone         Medication List        Accurate as of April 16, 2021  2:40 PM. If you have any questions, ask your nurse or doctor.          alendronate 70 MG tablet Commonly known as: FOSAMAX Take 70 mg by mouth every Monday.   aspirin 81 MG chewable tablet Chew 81 mg by mouth daily.   CENTRUM SILVER PO Take 1 tablet by mouth daily.   estradiol 0.1 MG/GM vaginal cream Commonly known as: ESTRACE Place 1 Applicatorful vaginally at bedtime. Estrogen Cream Instruction Discard applicator Apply pea sized amount to tip of finger to urethra before bed. Wash hands well after application. Use everyday once a day for the first two weeks. Then 3 times a week.   furosemide 20 MG tablet Commonly known as: LASIX Take 1 tablet (20 mg total) by mouth 2 (two) times daily.   Myrbetriq 50 MG Tb24 tablet Generic drug: mirabegron ER TAKE 1 TABLET BY MOUTH EVERY DAY   Oyster Shell Calcium 500-400 MG-UNIT Tabs Take 1 tablet by mouth daily.   spironolactone 50 MG tablet Commonly known as: ALDACTONE Take 50 mg by mouth every other day.   sulfamethoxazole-trimethoprim 800-160 MG tablet Commonly known as: BACTRIM DS Take 1 tablet by mouth every 12 (twelve) hours. Started by: Hollice Espy, MD   trimethoprim 100 MG tablet Commonly known as: TRIMPEX Take 1 tablet (100 mg total) by mouth daily. Started by: Hollice Espy, MD  Allergies:  Allergies  Allergen Reactions   Oxycodone     Family History: Family History  Problem Relation Age of Onset   Kidney disease Neg Hx    Bladder Cancer Neg Hx     Social History:  reports that she has quit smoking. She has never used smokeless tobacco. She reports that she does not drink alcohol and does not use drugs.   Physical Exam: BP 136/81    Pulse 93    Ht 5\' 4"  (1.626 m)    Wt 153 lb (69.4 kg)    BMI 26.26 kg/m   Constitutional:  Alert and oriented, No acute distress. HEENT: [place] AT, moist mucus membranes.   Trachea midline, no masses. Cardiovascular: No clubbing, cyanosis, or edema. Respiratory: Normal respiratory effort, no increased work of breathing. Skin: No rashes, bruises or suspicious lesions. Neurologic: Grossly intact, no focal deficits, moving all 4 extremities. Psychiatric: Normal mood and affect.  Laboratory Data:  Lab Results  Component Value Date   CREATININE 0.87 05/19/2019    Urinalysis >30 wbcs, 11-30 rbcs, nitrite negative, few bacteria (catheterized specimen today, see CMA note)  Assessment & Plan:    Recurrent UTI  - Urine suspicious for infection today catheterized specimen - Will treat as infection with Bactrim DS twice daily x7 days, will hold spironolactone while she takes this medication - Will place her on trimethoprim x3 months for prophylaxis and continue her on estrogen cream.  - Urine sent for culture   I,Kailey Littlejohn,acting as a scribe for Hollice Espy, MD.,have documented all relevant documentation on the behalf of Hollice Espy, MD,as directed by  Hollice Espy, MD while in the presence of Hollice Espy, MD.  I have reviewed the above documentation for accuracy and completeness, and I agree with the above.   Hollice Espy, MD   [facility] [address] [place], [place] [address] [phone]

## 2021-04-17 ENCOUNTER — Other Ambulatory Visit: Payer: Self-pay | Admitting: Urology

## 2021-04-18 ENCOUNTER — Telehealth: Payer: Self-pay | Admitting: Family Medicine

## 2021-04-18 ENCOUNTER — Other Ambulatory Visit: Payer: Self-pay | Admitting: Urology

## 2021-04-18 DIAGNOSIS — N390 Urinary tract infection, site not specified: Secondary | ICD-10-CM

## 2021-04-18 NOTE — Telephone Encounter (Signed)
Patient called and states she was unable to get Bactrim because there is an interaction to Spironolactone. I informed her per Dr. Cherrie Gauze note she is to stop the Spironolactone until she completes the course of ABX then she is to take Trimethoprim for 3 months and also continue Estrace. Patient voiced understanding and will go to pharmacy to get the medication.

## 2021-04-18 NOTE — Progress Notes (Signed)
In and Out Catheterization  Patient is present today for a I & O catheterization due to uti symptoms. Patient was cleaned and prepped in a sterile fashion with betadine . A 14FR cath was inserted no complications were noted , 44ml of urine return was noted, urine was yellow in color. A clean urine sample was collected for urinalysis. Bladder was drained  And catheter was removed with out difficulty.    Performed by: Kerman Passey, RMA  Follow up/ Additional notes: as directed.

## 2021-04-23 ENCOUNTER — Other Ambulatory Visit: Payer: Self-pay | Admitting: Urology

## 2021-04-23 DIAGNOSIS — N390 Urinary tract infection, site not specified: Secondary | ICD-10-CM

## 2021-04-25 DIAGNOSIS — J439 Emphysema, unspecified: Secondary | ICD-10-CM | POA: Diagnosis not present

## 2021-04-25 DIAGNOSIS — J432 Centrilobular emphysema: Secondary | ICD-10-CM | POA: Diagnosis not present

## 2021-04-28 DIAGNOSIS — J449 Chronic obstructive pulmonary disease, unspecified: Secondary | ICD-10-CM | POA: Diagnosis not present

## 2021-05-06 DIAGNOSIS — I10 Essential (primary) hypertension: Secondary | ICD-10-CM | POA: Diagnosis not present

## 2021-05-13 DIAGNOSIS — I10 Essential (primary) hypertension: Secondary | ICD-10-CM | POA: Diagnosis not present

## 2021-05-13 DIAGNOSIS — Z0000 Encounter for general adult medical examination without abnormal findings: Secondary | ICD-10-CM | POA: Diagnosis not present

## 2021-05-13 DIAGNOSIS — N1832 Chronic kidney disease, stage 3b: Secondary | ICD-10-CM | POA: Diagnosis not present

## 2021-05-13 DIAGNOSIS — J432 Centrilobular emphysema: Secondary | ICD-10-CM | POA: Diagnosis not present

## 2021-05-13 DIAGNOSIS — J9611 Chronic respiratory failure with hypoxia: Secondary | ICD-10-CM | POA: Diagnosis not present

## 2021-05-13 DIAGNOSIS — G912 (Idiopathic) normal pressure hydrocephalus: Secondary | ICD-10-CM | POA: Diagnosis not present

## 2021-05-26 DIAGNOSIS — J449 Chronic obstructive pulmonary disease, unspecified: Secondary | ICD-10-CM | POA: Diagnosis not present

## 2021-06-04 DIAGNOSIS — G950 Syringomyelia and syringobulbia: Secondary | ICD-10-CM | POA: Diagnosis not present

## 2021-06-04 DIAGNOSIS — N183 Chronic kidney disease, stage 3 unspecified: Secondary | ICD-10-CM | POA: Diagnosis not present

## 2021-06-04 DIAGNOSIS — J449 Chronic obstructive pulmonary disease, unspecified: Secondary | ICD-10-CM | POA: Diagnosis not present

## 2021-06-04 DIAGNOSIS — Z87891 Personal history of nicotine dependence: Secondary | ICD-10-CM | POA: Diagnosis not present

## 2021-06-04 DIAGNOSIS — I509 Heart failure, unspecified: Secondary | ICD-10-CM | POA: Diagnosis not present

## 2021-06-04 DIAGNOSIS — G20 Parkinson's disease: Secondary | ICD-10-CM | POA: Diagnosis not present

## 2021-06-04 DIAGNOSIS — F0280 Dementia in other diseases classified elsewhere without behavioral disturbance: Secondary | ICD-10-CM | POA: Diagnosis not present

## 2021-06-26 DIAGNOSIS — J449 Chronic obstructive pulmonary disease, unspecified: Secondary | ICD-10-CM | POA: Diagnosis not present

## 2021-07-01 DIAGNOSIS — J432 Centrilobular emphysema: Secondary | ICD-10-CM | POA: Diagnosis not present

## 2021-07-01 IMAGING — US US RENAL
1 series · 13 of 25 positions shown · non-contrast
Comparison: Renal ultrasound dated 08/18/2017 and CT of the abdomen
pelvis dated 06/13/2015

CLINICAL DATA: 68-year-old female with history of left UPJ
obstruction and left-sided hydronephrosis.

EXAM:
RENAL / URINARY TRACT ULTRASOUND COMPLETE

[Series 1: us renal · 13 of 60 slices shown]
[im 1/60]
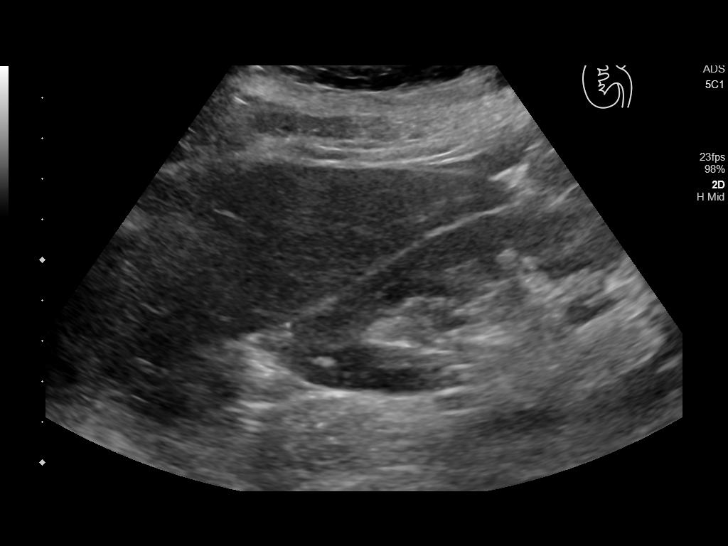
[im 5/60]
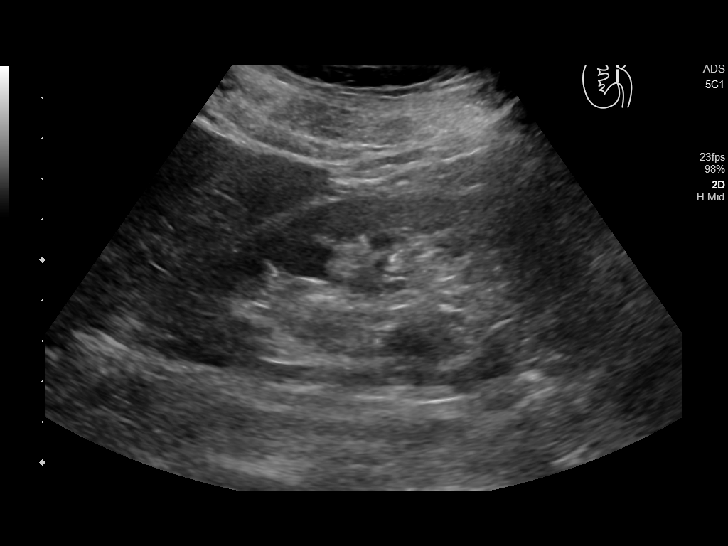
[im 10/60]
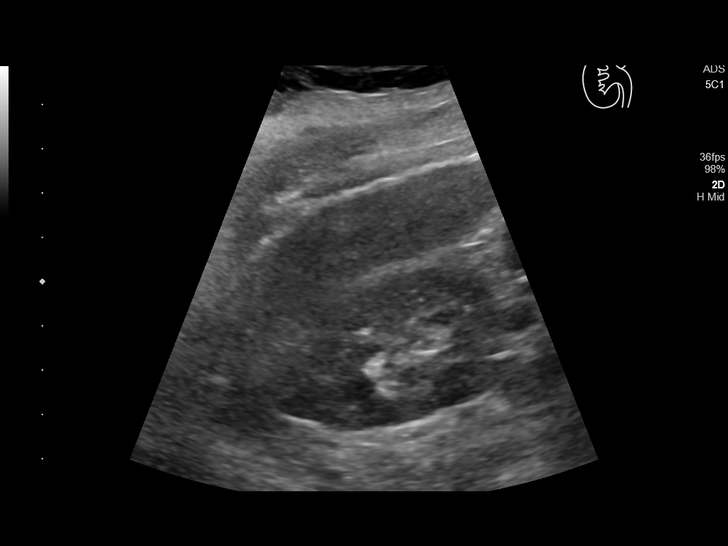
[im 15/60]
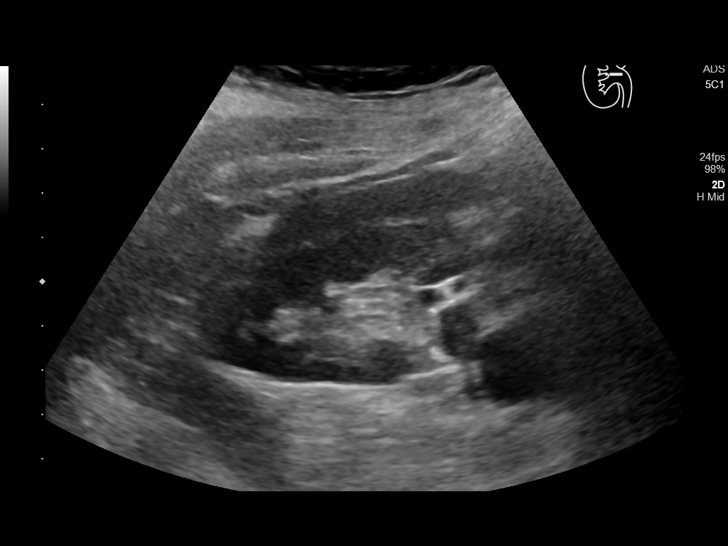
[im 20/60]
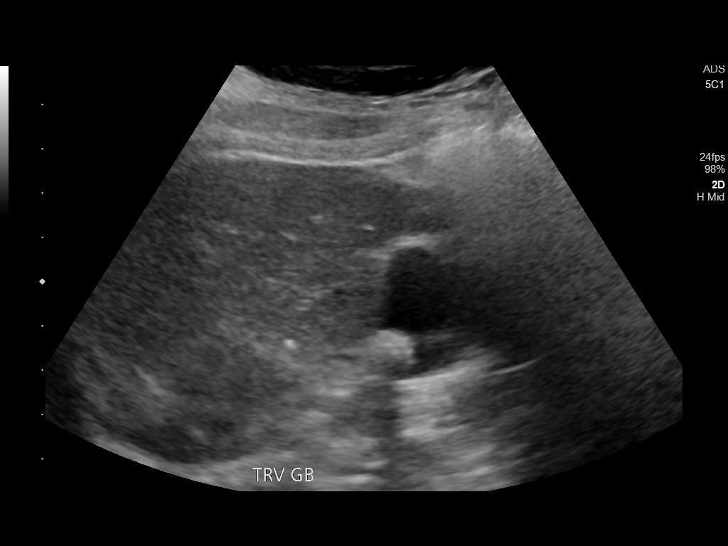
[im 25/60]
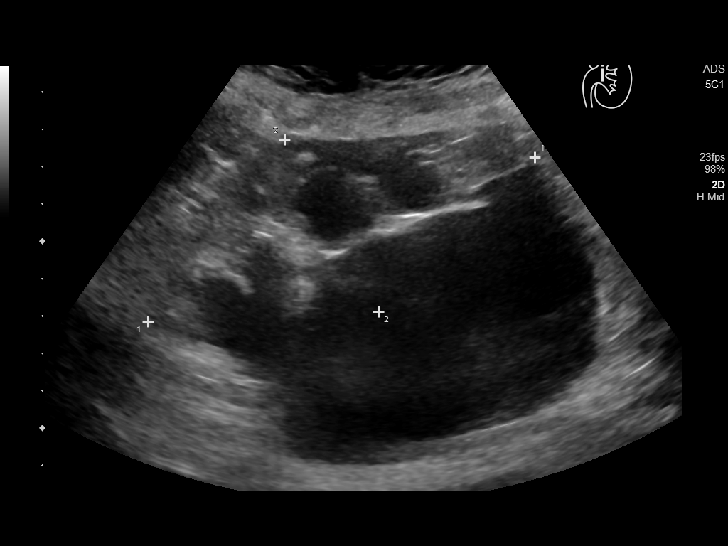
[im 30/60]
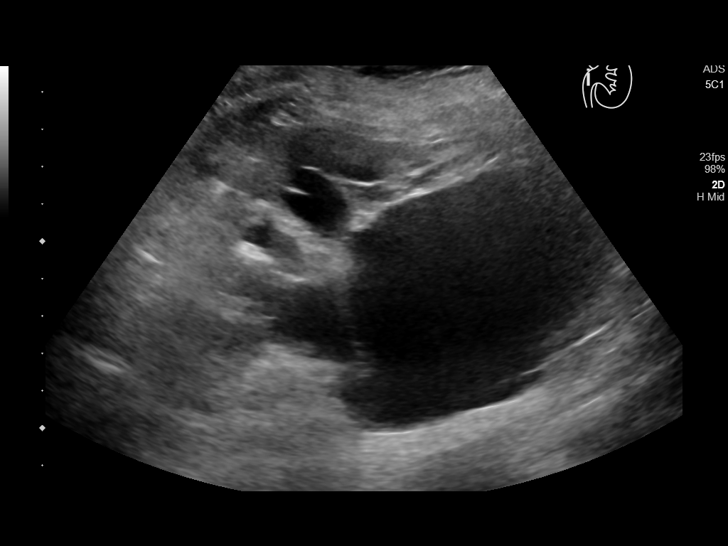
[im 35/60]
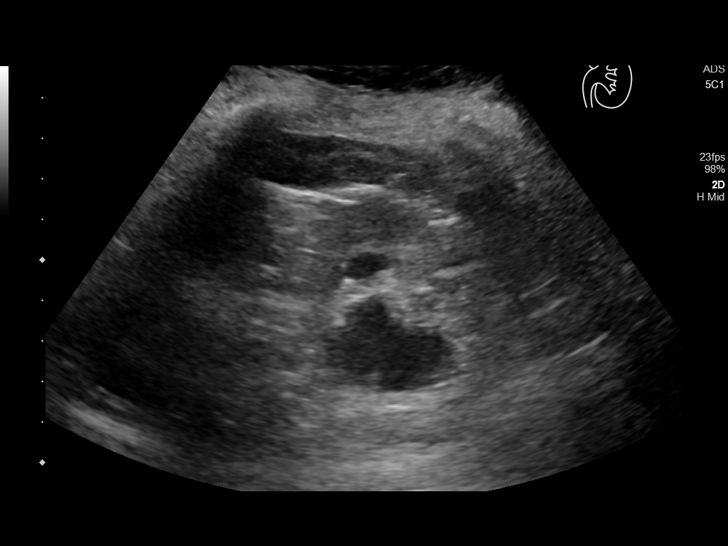
[im 40/60]
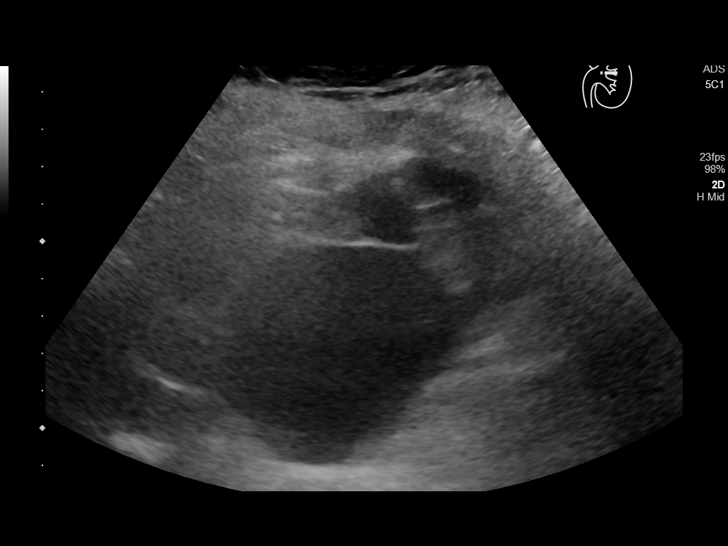
[im 45/60]
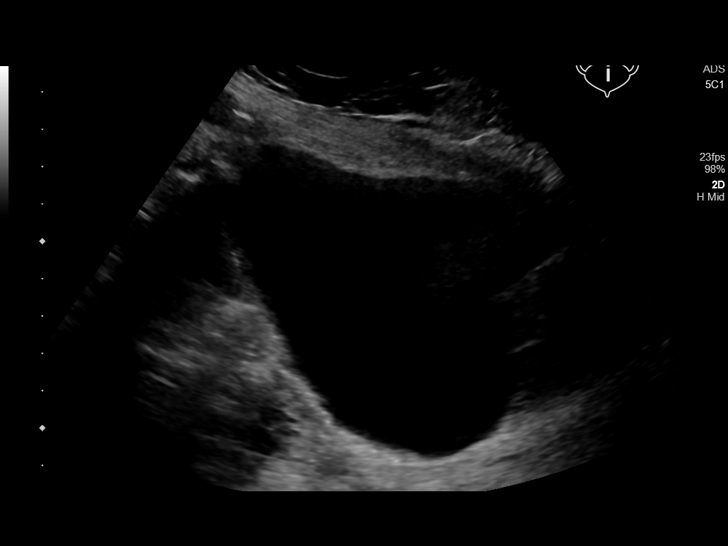
[im 50/60]
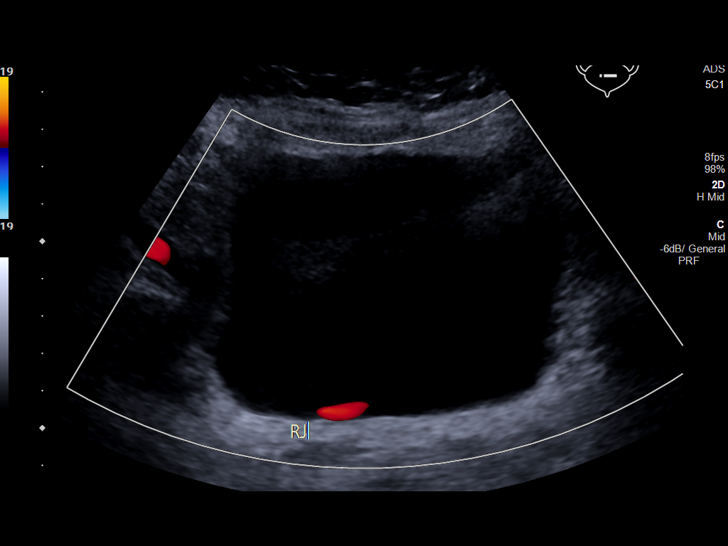
[im 55/60]
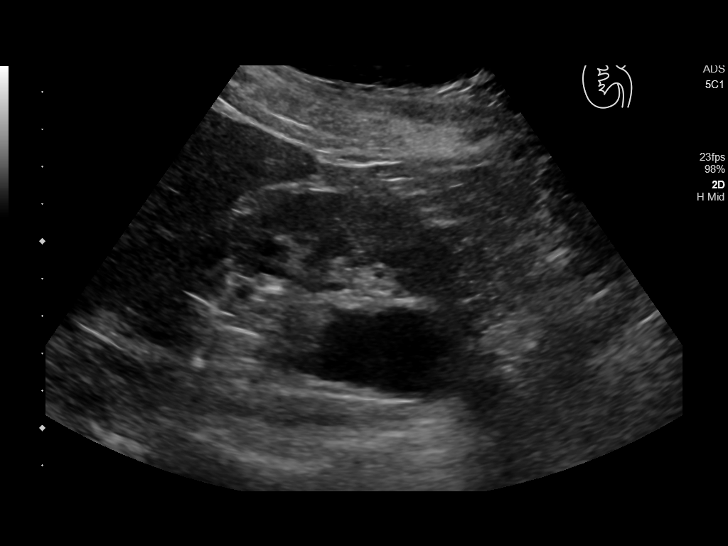
[im 60/60]
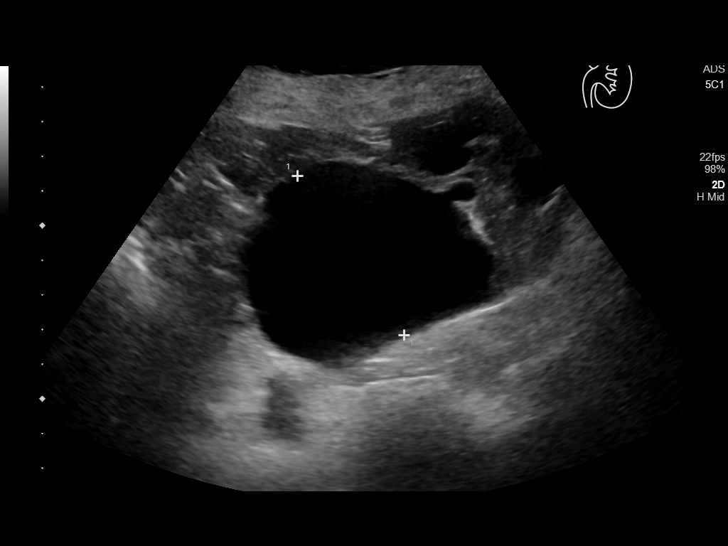

[13 of 25 positions shown; findings below may reference images not displayed]

FINDINGS: Right Kidney:

Renal measurements: 10.5 x 5.0 x 5.2 cm = volume: 142 mL. Normal
echogenicity. No hydronephrosis or shadowing stone. There is an
extrarenal pelvis with mild pelviectasis.

Left Kidney:

Renal measurements: 11.2 x 5.2 x 4.0 cm = volume: 124 mL. Severe
left hydronephrosis with severe dilatation of the left renal pelvis
similar to prior ultrasound and compatible with history of left UPJ
obstruction. These findings are relatively similar to the CT of
06/13/2015 when accounting for difference in modality. No definite
stone identified in the visualized left kidney. There is compression
of the left renal parenchyma secondary to hydronephrosis and
enlarged pelvis.

Bladder:

The urinary bladder is grossly unremarkable for the degree of
distention. Right ureteral jets identified. The left ureteral jet is
not visualized.

Incidental note of gallstone.
IMPRESSION: 1. Severe left hydronephrosis similar to prior ultrasound and CT.
2. Nonvisualization of the left ureteral jet.
3. Cholelithiasis.

## 2021-07-22 DIAGNOSIS — J449 Chronic obstructive pulmonary disease, unspecified: Secondary | ICD-10-CM | POA: Diagnosis not present

## 2021-07-22 DIAGNOSIS — Z1331 Encounter for screening for depression: Secondary | ICD-10-CM | POA: Diagnosis not present

## 2021-07-23 DIAGNOSIS — D2261 Melanocytic nevi of right upper limb, including shoulder: Secondary | ICD-10-CM | POA: Diagnosis not present

## 2021-07-23 DIAGNOSIS — L821 Other seborrheic keratosis: Secondary | ICD-10-CM | POA: Diagnosis not present

## 2021-07-23 DIAGNOSIS — L218 Other seborrheic dermatitis: Secondary | ICD-10-CM | POA: Diagnosis not present

## 2021-07-23 DIAGNOSIS — L570 Actinic keratosis: Secondary | ICD-10-CM | POA: Diagnosis not present

## 2021-07-23 DIAGNOSIS — Z85828 Personal history of other malignant neoplasm of skin: Secondary | ICD-10-CM | POA: Diagnosis not present

## 2021-07-23 DIAGNOSIS — D225 Melanocytic nevi of trunk: Secondary | ICD-10-CM | POA: Diagnosis not present

## 2021-07-24 DIAGNOSIS — I5032 Chronic diastolic (congestive) heart failure: Secondary | ICD-10-CM | POA: Diagnosis not present

## 2021-07-24 DIAGNOSIS — I110 Hypertensive heart disease with heart failure: Secondary | ICD-10-CM | POA: Diagnosis not present

## 2021-07-24 DIAGNOSIS — Z8673 Personal history of transient ischemic attack (TIA), and cerebral infarction without residual deficits: Secondary | ICD-10-CM | POA: Diagnosis not present

## 2021-07-24 DIAGNOSIS — Z87891 Personal history of nicotine dependence: Secondary | ICD-10-CM | POA: Diagnosis not present

## 2021-07-24 DIAGNOSIS — J9611 Chronic respiratory failure with hypoxia: Secondary | ICD-10-CM | POA: Diagnosis not present

## 2021-07-24 DIAGNOSIS — Z9181 History of falling: Secondary | ICD-10-CM | POA: Diagnosis not present

## 2021-07-24 DIAGNOSIS — J9612 Chronic respiratory failure with hypercapnia: Secondary | ICD-10-CM | POA: Diagnosis not present

## 2021-07-24 DIAGNOSIS — M21372 Foot drop, left foot: Secondary | ICD-10-CM | POA: Diagnosis not present

## 2021-07-24 DIAGNOSIS — J432 Centrilobular emphysema: Secondary | ICD-10-CM | POA: Diagnosis not present

## 2021-07-24 DIAGNOSIS — Z7982 Long term (current) use of aspirin: Secondary | ICD-10-CM | POA: Diagnosis not present

## 2021-07-24 DIAGNOSIS — I700 Atherosclerosis of aorta: Secondary | ICD-10-CM | POA: Diagnosis not present

## 2021-07-24 DIAGNOSIS — G912 (Idiopathic) normal pressure hydrocephalus: Secondary | ICD-10-CM | POA: Diagnosis not present

## 2021-07-24 DIAGNOSIS — I2510 Atherosclerotic heart disease of native coronary artery without angina pectoris: Secondary | ICD-10-CM | POA: Diagnosis not present

## 2021-07-24 DIAGNOSIS — Z85828 Personal history of other malignant neoplasm of skin: Secondary | ICD-10-CM | POA: Diagnosis not present

## 2021-07-25 ENCOUNTER — Telehealth: Payer: Self-pay

## 2021-07-25 NOTE — Telephone Encounter (Signed)
The purpose of this medication is to temporarily suppress infections and break the cycle.  I strongly prefer her to be off of the medication, we can consider resuming if she has another urinary tract infection that is documented.  There are some risks of being on long-term suppression.  Hollice Espy, MD

## 2021-07-25 NOTE — Telephone Encounter (Signed)
Incoming call from pt who states that she has just completed 90 day course of Trimethoprim. She states that she has noticed a tremendous difference since being on the medication and questions if she can continue to take it, if so she requests a refill. Please advise.

## 2021-07-25 NOTE — Telephone Encounter (Addendum)
Patient informed, voiced understanding. Reviewed precautions for UTI prevention.

## 2021-07-26 DIAGNOSIS — J449 Chronic obstructive pulmonary disease, unspecified: Secondary | ICD-10-CM | POA: Diagnosis not present

## 2021-08-06 DIAGNOSIS — I5032 Chronic diastolic (congestive) heart failure: Secondary | ICD-10-CM | POA: Diagnosis not present

## 2021-08-06 DIAGNOSIS — Z85828 Personal history of other malignant neoplasm of skin: Secondary | ICD-10-CM | POA: Diagnosis not present

## 2021-08-06 DIAGNOSIS — G912 (Idiopathic) normal pressure hydrocephalus: Secondary | ICD-10-CM | POA: Diagnosis not present

## 2021-08-06 DIAGNOSIS — J432 Centrilobular emphysema: Secondary | ICD-10-CM | POA: Diagnosis not present

## 2021-08-06 DIAGNOSIS — J9611 Chronic respiratory failure with hypoxia: Secondary | ICD-10-CM | POA: Diagnosis not present

## 2021-08-06 DIAGNOSIS — I700 Atherosclerosis of aorta: Secondary | ICD-10-CM | POA: Diagnosis not present

## 2021-08-06 DIAGNOSIS — Z87891 Personal history of nicotine dependence: Secondary | ICD-10-CM | POA: Diagnosis not present

## 2021-08-06 DIAGNOSIS — Z8673 Personal history of transient ischemic attack (TIA), and cerebral infarction without residual deficits: Secondary | ICD-10-CM | POA: Diagnosis not present

## 2021-08-06 DIAGNOSIS — I110 Hypertensive heart disease with heart failure: Secondary | ICD-10-CM | POA: Diagnosis not present

## 2021-08-06 DIAGNOSIS — M21372 Foot drop, left foot: Secondary | ICD-10-CM | POA: Diagnosis not present

## 2021-08-06 DIAGNOSIS — Z7982 Long term (current) use of aspirin: Secondary | ICD-10-CM | POA: Diagnosis not present

## 2021-08-06 DIAGNOSIS — J9612 Chronic respiratory failure with hypercapnia: Secondary | ICD-10-CM | POA: Diagnosis not present

## 2021-08-06 DIAGNOSIS — Z9181 History of falling: Secondary | ICD-10-CM | POA: Diagnosis not present

## 2021-08-06 DIAGNOSIS — I2510 Atherosclerotic heart disease of native coronary artery without angina pectoris: Secondary | ICD-10-CM | POA: Diagnosis not present

## 2021-08-07 DIAGNOSIS — I2510 Atherosclerotic heart disease of native coronary artery without angina pectoris: Secondary | ICD-10-CM | POA: Diagnosis not present

## 2021-08-07 DIAGNOSIS — J9612 Chronic respiratory failure with hypercapnia: Secondary | ICD-10-CM | POA: Diagnosis not present

## 2021-08-07 DIAGNOSIS — Z9181 History of falling: Secondary | ICD-10-CM | POA: Diagnosis not present

## 2021-08-07 DIAGNOSIS — G912 (Idiopathic) normal pressure hydrocephalus: Secondary | ICD-10-CM | POA: Diagnosis not present

## 2021-08-07 DIAGNOSIS — I700 Atherosclerosis of aorta: Secondary | ICD-10-CM | POA: Diagnosis not present

## 2021-08-07 DIAGNOSIS — I110 Hypertensive heart disease with heart failure: Secondary | ICD-10-CM | POA: Diagnosis not present

## 2021-08-07 DIAGNOSIS — Z8673 Personal history of transient ischemic attack (TIA), and cerebral infarction without residual deficits: Secondary | ICD-10-CM | POA: Diagnosis not present

## 2021-08-07 DIAGNOSIS — Z7982 Long term (current) use of aspirin: Secondary | ICD-10-CM | POA: Diagnosis not present

## 2021-08-07 DIAGNOSIS — M21372 Foot drop, left foot: Secondary | ICD-10-CM | POA: Diagnosis not present

## 2021-08-07 DIAGNOSIS — J432 Centrilobular emphysema: Secondary | ICD-10-CM | POA: Diagnosis not present

## 2021-08-07 DIAGNOSIS — Z87891 Personal history of nicotine dependence: Secondary | ICD-10-CM | POA: Diagnosis not present

## 2021-08-07 DIAGNOSIS — Z85828 Personal history of other malignant neoplasm of skin: Secondary | ICD-10-CM | POA: Diagnosis not present

## 2021-08-07 DIAGNOSIS — J9611 Chronic respiratory failure with hypoxia: Secondary | ICD-10-CM | POA: Diagnosis not present

## 2021-08-07 DIAGNOSIS — I5032 Chronic diastolic (congestive) heart failure: Secondary | ICD-10-CM | POA: Diagnosis not present

## 2021-08-08 DIAGNOSIS — J9611 Chronic respiratory failure with hypoxia: Secondary | ICD-10-CM | POA: Diagnosis not present

## 2021-08-08 DIAGNOSIS — J9612 Chronic respiratory failure with hypercapnia: Secondary | ICD-10-CM | POA: Diagnosis not present

## 2021-08-08 DIAGNOSIS — J432 Centrilobular emphysema: Secondary | ICD-10-CM | POA: Diagnosis not present

## 2021-08-08 DIAGNOSIS — I110 Hypertensive heart disease with heart failure: Secondary | ICD-10-CM | POA: Diagnosis not present

## 2021-08-08 DIAGNOSIS — I700 Atherosclerosis of aorta: Secondary | ICD-10-CM | POA: Diagnosis not present

## 2021-08-08 DIAGNOSIS — I5032 Chronic diastolic (congestive) heart failure: Secondary | ICD-10-CM | POA: Diagnosis not present

## 2021-08-08 DIAGNOSIS — I2510 Atherosclerotic heart disease of native coronary artery without angina pectoris: Secondary | ICD-10-CM | POA: Diagnosis not present

## 2021-08-12 DIAGNOSIS — I700 Atherosclerosis of aorta: Secondary | ICD-10-CM | POA: Diagnosis not present

## 2021-08-12 DIAGNOSIS — Z7982 Long term (current) use of aspirin: Secondary | ICD-10-CM | POA: Diagnosis not present

## 2021-08-12 DIAGNOSIS — I2510 Atherosclerotic heart disease of native coronary artery without angina pectoris: Secondary | ICD-10-CM | POA: Diagnosis not present

## 2021-08-12 DIAGNOSIS — Z87891 Personal history of nicotine dependence: Secondary | ICD-10-CM | POA: Diagnosis not present

## 2021-08-12 DIAGNOSIS — I110 Hypertensive heart disease with heart failure: Secondary | ICD-10-CM | POA: Diagnosis not present

## 2021-08-12 DIAGNOSIS — J432 Centrilobular emphysema: Secondary | ICD-10-CM | POA: Diagnosis not present

## 2021-08-12 DIAGNOSIS — Z9181 History of falling: Secondary | ICD-10-CM | POA: Diagnosis not present

## 2021-08-12 DIAGNOSIS — Z85828 Personal history of other malignant neoplasm of skin: Secondary | ICD-10-CM | POA: Diagnosis not present

## 2021-08-12 DIAGNOSIS — Z8673 Personal history of transient ischemic attack (TIA), and cerebral infarction without residual deficits: Secondary | ICD-10-CM | POA: Diagnosis not present

## 2021-08-12 DIAGNOSIS — M21372 Foot drop, left foot: Secondary | ICD-10-CM | POA: Diagnosis not present

## 2021-08-12 DIAGNOSIS — G912 (Idiopathic) normal pressure hydrocephalus: Secondary | ICD-10-CM | POA: Diagnosis not present

## 2021-08-12 DIAGNOSIS — J9612 Chronic respiratory failure with hypercapnia: Secondary | ICD-10-CM | POA: Diagnosis not present

## 2021-08-12 DIAGNOSIS — I5032 Chronic diastolic (congestive) heart failure: Secondary | ICD-10-CM | POA: Diagnosis not present

## 2021-08-12 DIAGNOSIS — J9611 Chronic respiratory failure with hypoxia: Secondary | ICD-10-CM | POA: Diagnosis not present

## 2021-08-20 DIAGNOSIS — I2510 Atherosclerotic heart disease of native coronary artery without angina pectoris: Secondary | ICD-10-CM | POA: Diagnosis not present

## 2021-08-20 DIAGNOSIS — Z7982 Long term (current) use of aspirin: Secondary | ICD-10-CM | POA: Diagnosis not present

## 2021-08-20 DIAGNOSIS — Z87891 Personal history of nicotine dependence: Secondary | ICD-10-CM | POA: Diagnosis not present

## 2021-08-20 DIAGNOSIS — Z85828 Personal history of other malignant neoplasm of skin: Secondary | ICD-10-CM | POA: Diagnosis not present

## 2021-08-20 DIAGNOSIS — J432 Centrilobular emphysema: Secondary | ICD-10-CM | POA: Diagnosis not present

## 2021-08-20 DIAGNOSIS — H2513 Age-related nuclear cataract, bilateral: Secondary | ICD-10-CM | POA: Diagnosis not present

## 2021-08-20 DIAGNOSIS — H353131 Nonexudative age-related macular degeneration, bilateral, early dry stage: Secondary | ICD-10-CM | POA: Diagnosis not present

## 2021-08-20 DIAGNOSIS — H5203 Hypermetropia, bilateral: Secondary | ICD-10-CM | POA: Diagnosis not present

## 2021-08-20 DIAGNOSIS — J9612 Chronic respiratory failure with hypercapnia: Secondary | ICD-10-CM | POA: Diagnosis not present

## 2021-08-20 DIAGNOSIS — H31123 Diffuse secondary atrophy of choroid, bilateral: Secondary | ICD-10-CM | POA: Diagnosis not present

## 2021-08-20 DIAGNOSIS — I5032 Chronic diastolic (congestive) heart failure: Secondary | ICD-10-CM | POA: Diagnosis not present

## 2021-08-20 DIAGNOSIS — I700 Atherosclerosis of aorta: Secondary | ICD-10-CM | POA: Diagnosis not present

## 2021-08-20 DIAGNOSIS — J9611 Chronic respiratory failure with hypoxia: Secondary | ICD-10-CM | POA: Diagnosis not present

## 2021-08-20 DIAGNOSIS — H524 Presbyopia: Secondary | ICD-10-CM | POA: Diagnosis not present

## 2021-08-20 DIAGNOSIS — I110 Hypertensive heart disease with heart failure: Secondary | ICD-10-CM | POA: Diagnosis not present

## 2021-08-20 DIAGNOSIS — G912 (Idiopathic) normal pressure hydrocephalus: Secondary | ICD-10-CM | POA: Diagnosis not present

## 2021-08-20 DIAGNOSIS — M21372 Foot drop, left foot: Secondary | ICD-10-CM | POA: Diagnosis not present

## 2021-08-20 DIAGNOSIS — Z9181 History of falling: Secondary | ICD-10-CM | POA: Diagnosis not present

## 2021-08-20 DIAGNOSIS — H52223 Regular astigmatism, bilateral: Secondary | ICD-10-CM | POA: Diagnosis not present

## 2021-08-20 DIAGNOSIS — Z8673 Personal history of transient ischemic attack (TIA), and cerebral infarction without residual deficits: Secondary | ICD-10-CM | POA: Diagnosis not present

## 2021-08-22 DIAGNOSIS — M21372 Foot drop, left foot: Secondary | ICD-10-CM | POA: Diagnosis not present

## 2021-08-22 DIAGNOSIS — I2510 Atherosclerotic heart disease of native coronary artery without angina pectoris: Secondary | ICD-10-CM | POA: Diagnosis not present

## 2021-08-22 DIAGNOSIS — J432 Centrilobular emphysema: Secondary | ICD-10-CM | POA: Diagnosis not present

## 2021-08-22 DIAGNOSIS — J9612 Chronic respiratory failure with hypercapnia: Secondary | ICD-10-CM | POA: Diagnosis not present

## 2021-08-22 DIAGNOSIS — G912 (Idiopathic) normal pressure hydrocephalus: Secondary | ICD-10-CM | POA: Diagnosis not present

## 2021-08-22 DIAGNOSIS — Z85828 Personal history of other malignant neoplasm of skin: Secondary | ICD-10-CM | POA: Diagnosis not present

## 2021-08-22 DIAGNOSIS — I5032 Chronic diastolic (congestive) heart failure: Secondary | ICD-10-CM | POA: Diagnosis not present

## 2021-08-22 DIAGNOSIS — Z87891 Personal history of nicotine dependence: Secondary | ICD-10-CM | POA: Diagnosis not present

## 2021-08-22 DIAGNOSIS — Z8673 Personal history of transient ischemic attack (TIA), and cerebral infarction without residual deficits: Secondary | ICD-10-CM | POA: Diagnosis not present

## 2021-08-22 DIAGNOSIS — Z9181 History of falling: Secondary | ICD-10-CM | POA: Diagnosis not present

## 2021-08-22 DIAGNOSIS — J9611 Chronic respiratory failure with hypoxia: Secondary | ICD-10-CM | POA: Diagnosis not present

## 2021-08-22 DIAGNOSIS — I110 Hypertensive heart disease with heart failure: Secondary | ICD-10-CM | POA: Diagnosis not present

## 2021-08-22 DIAGNOSIS — Z7982 Long term (current) use of aspirin: Secondary | ICD-10-CM | POA: Diagnosis not present

## 2021-08-22 DIAGNOSIS — I700 Atherosclerosis of aorta: Secondary | ICD-10-CM | POA: Diagnosis not present

## 2021-08-27 DIAGNOSIS — I2510 Atherosclerotic heart disease of native coronary artery without angina pectoris: Secondary | ICD-10-CM | POA: Diagnosis not present

## 2021-08-27 DIAGNOSIS — J9611 Chronic respiratory failure with hypoxia: Secondary | ICD-10-CM | POA: Diagnosis not present

## 2021-08-27 DIAGNOSIS — G912 (Idiopathic) normal pressure hydrocephalus: Secondary | ICD-10-CM | POA: Diagnosis not present

## 2021-08-27 DIAGNOSIS — J432 Centrilobular emphysema: Secondary | ICD-10-CM | POA: Diagnosis not present

## 2021-08-27 DIAGNOSIS — Z87891 Personal history of nicotine dependence: Secondary | ICD-10-CM | POA: Diagnosis not present

## 2021-08-27 DIAGNOSIS — I5032 Chronic diastolic (congestive) heart failure: Secondary | ICD-10-CM | POA: Diagnosis not present

## 2021-08-27 DIAGNOSIS — M21372 Foot drop, left foot: Secondary | ICD-10-CM | POA: Diagnosis not present

## 2021-08-27 DIAGNOSIS — Z9181 History of falling: Secondary | ICD-10-CM | POA: Diagnosis not present

## 2021-08-27 DIAGNOSIS — Z7982 Long term (current) use of aspirin: Secondary | ICD-10-CM | POA: Diagnosis not present

## 2021-08-27 DIAGNOSIS — J9612 Chronic respiratory failure with hypercapnia: Secondary | ICD-10-CM | POA: Diagnosis not present

## 2021-08-27 DIAGNOSIS — Z85828 Personal history of other malignant neoplasm of skin: Secondary | ICD-10-CM | POA: Diagnosis not present

## 2021-08-27 DIAGNOSIS — I110 Hypertensive heart disease with heart failure: Secondary | ICD-10-CM | POA: Diagnosis not present

## 2021-08-27 DIAGNOSIS — Z8673 Personal history of transient ischemic attack (TIA), and cerebral infarction without residual deficits: Secondary | ICD-10-CM | POA: Diagnosis not present

## 2021-08-27 DIAGNOSIS — I700 Atherosclerosis of aorta: Secondary | ICD-10-CM | POA: Diagnosis not present

## 2021-08-29 DIAGNOSIS — Z8673 Personal history of transient ischemic attack (TIA), and cerebral infarction without residual deficits: Secondary | ICD-10-CM | POA: Diagnosis not present

## 2021-08-29 DIAGNOSIS — J9611 Chronic respiratory failure with hypoxia: Secondary | ICD-10-CM | POA: Diagnosis not present

## 2021-08-29 DIAGNOSIS — I5032 Chronic diastolic (congestive) heart failure: Secondary | ICD-10-CM | POA: Diagnosis not present

## 2021-08-29 DIAGNOSIS — I700 Atherosclerosis of aorta: Secondary | ICD-10-CM | POA: Diagnosis not present

## 2021-08-29 DIAGNOSIS — Z7982 Long term (current) use of aspirin: Secondary | ICD-10-CM | POA: Diagnosis not present

## 2021-08-29 DIAGNOSIS — Z9181 History of falling: Secondary | ICD-10-CM | POA: Diagnosis not present

## 2021-08-29 DIAGNOSIS — F0390 Unspecified dementia without behavioral disturbance: Secondary | ICD-10-CM | POA: Diagnosis not present

## 2021-08-29 DIAGNOSIS — J432 Centrilobular emphysema: Secondary | ICD-10-CM | POA: Diagnosis not present

## 2021-08-29 DIAGNOSIS — J9612 Chronic respiratory failure with hypercapnia: Secondary | ICD-10-CM | POA: Diagnosis not present

## 2021-08-29 DIAGNOSIS — Z87891 Personal history of nicotine dependence: Secondary | ICD-10-CM | POA: Diagnosis not present

## 2021-08-29 DIAGNOSIS — I2510 Atherosclerotic heart disease of native coronary artery without angina pectoris: Secondary | ICD-10-CM | POA: Diagnosis not present

## 2021-08-29 DIAGNOSIS — Z85828 Personal history of other malignant neoplasm of skin: Secondary | ICD-10-CM | POA: Diagnosis not present

## 2021-08-29 DIAGNOSIS — I110 Hypertensive heart disease with heart failure: Secondary | ICD-10-CM | POA: Diagnosis not present

## 2021-08-29 DIAGNOSIS — G912 (Idiopathic) normal pressure hydrocephalus: Secondary | ICD-10-CM | POA: Diagnosis not present

## 2021-08-29 DIAGNOSIS — M21372 Foot drop, left foot: Secondary | ICD-10-CM | POA: Diagnosis not present

## 2021-08-29 DIAGNOSIS — J449 Chronic obstructive pulmonary disease, unspecified: Secondary | ICD-10-CM | POA: Diagnosis not present

## 2021-09-04 DIAGNOSIS — Z7982 Long term (current) use of aspirin: Secondary | ICD-10-CM | POA: Diagnosis not present

## 2021-09-04 DIAGNOSIS — Z85828 Personal history of other malignant neoplasm of skin: Secondary | ICD-10-CM | POA: Diagnosis not present

## 2021-09-04 DIAGNOSIS — I5032 Chronic diastolic (congestive) heart failure: Secondary | ICD-10-CM | POA: Diagnosis not present

## 2021-09-04 DIAGNOSIS — M21372 Foot drop, left foot: Secondary | ICD-10-CM | POA: Diagnosis not present

## 2021-09-04 DIAGNOSIS — I700 Atherosclerosis of aorta: Secondary | ICD-10-CM | POA: Diagnosis not present

## 2021-09-04 DIAGNOSIS — J9612 Chronic respiratory failure with hypercapnia: Secondary | ICD-10-CM | POA: Diagnosis not present

## 2021-09-04 DIAGNOSIS — J9611 Chronic respiratory failure with hypoxia: Secondary | ICD-10-CM | POA: Diagnosis not present

## 2021-09-04 DIAGNOSIS — J432 Centrilobular emphysema: Secondary | ICD-10-CM | POA: Diagnosis not present

## 2021-09-04 DIAGNOSIS — Z9181 History of falling: Secondary | ICD-10-CM | POA: Diagnosis not present

## 2021-09-04 DIAGNOSIS — Z87891 Personal history of nicotine dependence: Secondary | ICD-10-CM | POA: Diagnosis not present

## 2021-09-04 DIAGNOSIS — I110 Hypertensive heart disease with heart failure: Secondary | ICD-10-CM | POA: Diagnosis not present

## 2021-09-04 DIAGNOSIS — G912 (Idiopathic) normal pressure hydrocephalus: Secondary | ICD-10-CM | POA: Diagnosis not present

## 2021-09-04 DIAGNOSIS — Z8673 Personal history of transient ischemic attack (TIA), and cerebral infarction without residual deficits: Secondary | ICD-10-CM | POA: Diagnosis not present

## 2021-09-04 DIAGNOSIS — I2510 Atherosclerotic heart disease of native coronary artery without angina pectoris: Secondary | ICD-10-CM | POA: Diagnosis not present

## 2021-09-10 DIAGNOSIS — Z8673 Personal history of transient ischemic attack (TIA), and cerebral infarction without residual deficits: Secondary | ICD-10-CM | POA: Diagnosis not present

## 2021-09-10 DIAGNOSIS — Z87891 Personal history of nicotine dependence: Secondary | ICD-10-CM | POA: Diagnosis not present

## 2021-09-10 DIAGNOSIS — Z85828 Personal history of other malignant neoplasm of skin: Secondary | ICD-10-CM | POA: Diagnosis not present

## 2021-09-10 DIAGNOSIS — Z9181 History of falling: Secondary | ICD-10-CM | POA: Diagnosis not present

## 2021-09-10 DIAGNOSIS — I700 Atherosclerosis of aorta: Secondary | ICD-10-CM | POA: Diagnosis not present

## 2021-09-10 DIAGNOSIS — J9611 Chronic respiratory failure with hypoxia: Secondary | ICD-10-CM | POA: Diagnosis not present

## 2021-09-10 DIAGNOSIS — M21372 Foot drop, left foot: Secondary | ICD-10-CM | POA: Diagnosis not present

## 2021-09-10 DIAGNOSIS — I110 Hypertensive heart disease with heart failure: Secondary | ICD-10-CM | POA: Diagnosis not present

## 2021-09-10 DIAGNOSIS — J432 Centrilobular emphysema: Secondary | ICD-10-CM | POA: Diagnosis not present

## 2021-09-10 DIAGNOSIS — J9612 Chronic respiratory failure with hypercapnia: Secondary | ICD-10-CM | POA: Diagnosis not present

## 2021-09-10 DIAGNOSIS — Z7982 Long term (current) use of aspirin: Secondary | ICD-10-CM | POA: Diagnosis not present

## 2021-09-10 DIAGNOSIS — I2510 Atherosclerotic heart disease of native coronary artery without angina pectoris: Secondary | ICD-10-CM | POA: Diagnosis not present

## 2021-09-10 DIAGNOSIS — I5032 Chronic diastolic (congestive) heart failure: Secondary | ICD-10-CM | POA: Diagnosis not present

## 2021-09-10 DIAGNOSIS — G912 (Idiopathic) normal pressure hydrocephalus: Secondary | ICD-10-CM | POA: Diagnosis not present

## 2021-09-11 ENCOUNTER — Ambulatory Visit: Payer: PPO | Admitting: Urology

## 2021-09-11 ENCOUNTER — Encounter: Payer: Self-pay | Admitting: Urology

## 2021-09-11 VITALS — BP 138/80 | HR 94 | Ht 64.0 in

## 2021-09-11 DIAGNOSIS — N3281 Overactive bladder: Secondary | ICD-10-CM | POA: Diagnosis not present

## 2021-09-11 DIAGNOSIS — Z87440 Personal history of urinary (tract) infections: Secondary | ICD-10-CM

## 2021-09-11 DIAGNOSIS — N3941 Urge incontinence: Secondary | ICD-10-CM

## 2021-09-11 LAB — BLADDER SCAN AMB NON-IMAGING: Scan Result: 102

## 2021-09-11 NOTE — Progress Notes (Signed)
09/11/21 11:08 AM   Abigail Hill 12-27-1950 [id number]  Referring provider:  Baxter Hire, MD [place],  [place] [address] Chief Complaint  Patient presents with   Urinary Incontinence     HPI: Abigail Hill is a 71 y.o.female with a personal history of  with a personal history of chronic left UPJ obstruction with severe left hydronephrosis and urge incontinence who presents today for a 1 year follow-up with [facility].   Patient has elected for conservative management of her chronic UPJ obstruction.    Most recently, she has had issues with recurrent urinary tract infections.  Ultimately, she ended up being placed on suppressive antibiotics which she she finished in May.  She has not had any further urinary tract infections since starting and completing this course.  She continues to use estrogen 3 times a week.  She is also using cranberry tablets.  She mentions today that she is on Lasix now twice daily.  Because of this, she has significant urinary frequency.  She is chronically on Myrbetriq.  PMH: Past Medical History:  Diagnosis Date   Abnormal gait 12/18/2015   BP (high blood pressure) 08/24/2015   Cancer ([place])    skin   Constriction of ureter (postoperative) 07/28/2015   Hydronephrosis 07/14/2015   Hypertension    Left foot drop 12/18/2015   Recurrent UTI    UPJ (ureteropelvic junction) obstruction 07/28/2015   UTI (lower urinary tract infection) 07/14/2015    Surgical History: Past Surgical History:  Procedure Laterality Date   broken foot  2012   COLONOSCOPY WITH PROPOFOL N/A 06/30/2017   Procedure: COLONOSCOPY WITH PROPOFOL;  Surgeon: Lollie Sails, MD;  Location: [facility] ENDOSCOPY;  Service: Endoscopy;  Laterality: N/A;   kidney repair  1985   repaired torn blood vessel in kidney    Home Medications:  Allergies as of 09/11/2021       Reactions   Oxycodone         Medication List        Accurate as of September 11, 2021 11:08 AM. If you  have any questions, ask your nurse or doctor.          STOP taking these medications    CENTRUM SILVER PO Stopped by: Hollice Espy, MD   furosemide 20 MG tablet Commonly known as: LASIX Stopped by: Hollice Espy, MD   sulfamethoxazole-trimethoprim 800-160 MG tablet Commonly known as: BACTRIM DS Stopped by: Hollice Espy, MD   trimethoprim 100 MG tablet Commonly known as: TRIMPEX Stopped by: Hollice Espy, MD       TAKE these medications    alendronate 70 MG tablet Commonly known as: FOSAMAX Take 70 mg by mouth every Monday.   aspirin 81 MG chewable tablet Chew 81 mg by mouth daily.   Cranberry 300 MG tablet Take 300 mg by mouth 2 (two) times daily.   estradiol 0.1 MG/GM vaginal cream Commonly known as: ESTRACE Place 1 Applicatorful vaginally at bedtime. Estrogen Cream Instruction Discard applicator Apply pea sized amount to tip of finger to urethra before bed. Wash hands well after application. Use everyday once a day for the first two weeks. Then 3 times a week.   Myrbetriq 50 MG Tb24 tablet Generic drug: mirabegron ER TAKE 1 TABLET BY MOUTH EVERY DAY   Oyster Shell Calcium 500-400 MG-UNIT Tabs Take 1 tablet by mouth daily.   spironolactone 25 MG tablet Commonly known as: ALDACTONE Take 25 mg by mouth daily. What changed: Another  medication with the same name was removed. Continue taking this medication, and follow the directions you see here. Changed by: Hollice Espy, MD   torsemide 20 MG tablet Commonly known as: DEMADEX Take by mouth.        Allergies:  Allergies  Allergen Reactions   Oxycodone     Family History: Family History  Problem Relation Age of Onset   Kidney disease Neg Hx    Bladder Cancer Neg Hx     Social History:  reports that she has quit smoking. She has never used smokeless tobacco. She reports that she does not drink alcohol and does not use drugs.   Physical Exam: BP 138/80   Pulse 94   Ht '5\' 4"'$  (1.626 m)    BMI 26.26 kg/m   Constitutional:  Alert and oriented, No acute distress. HEENT: [place] AT, moist mucus membranes.  Trachea midline, no masses. Cardiovascular: No clubbing, cyanosis, or edema. Respiratory: Normal respiratory effort, no increased work of breathing. Skin: No rashes, bruises or suspicious lesions. Neurologic: Grossly intact, no focal deficits, moving all 4 extremities. Psychiatric: Normal mood and affect.  Laboratory Data:  Lab Results  Component Value Date   CREATININE 0.87 05/19/2019    Pertinent Imaging: Results for orders placed or performed in visit on 09/11/21  Bladder Scan (Post Void Residual) in office  Result Value Ref Range   Scan Result 102     Assessment & Plan:    Recurrent UTI  - No recent infections in several months - Continue vaginal estrogen cream three times weekly, pea size amount per urethral meatus -  Counseled her in UTI prevention supplements such as cranberry tablets, probiotics, yogurt that has active lactobacillus culture, and d-mannose   2. OAB -We discussed urinary frequency likely related to diuretics, Myrbetriq likely decreasing the amount of urge incontinence and may also increase bladder capacity -She will continue this medication   F/u annually  I,Kailey Littlejohn,acting as a scribe for Hollice Espy, MD.,have documented all relevant documentation on the behalf of Hollice Espy, MD,as directed by  Hollice Espy, MD while in the presence of Hollice Espy, MD.  I have reviewed the above documentation for accuracy and completeness, and I agree with the above.   Hollice Espy, MD   [facility] [address], [place], [place] [address] [phone]

## 2021-09-18 ENCOUNTER — Ambulatory Visit: Payer: PPO | Admitting: Urology

## 2021-09-18 VITALS — BP 130/81 | HR 93 | Ht 64.0 in | Wt 153.0 lb

## 2021-09-18 DIAGNOSIS — N390 Urinary tract infection, site not specified: Secondary | ICD-10-CM

## 2021-09-18 DIAGNOSIS — N3000 Acute cystitis without hematuria: Secondary | ICD-10-CM | POA: Diagnosis not present

## 2021-09-18 DIAGNOSIS — Z87440 Personal history of urinary (tract) infections: Secondary | ICD-10-CM

## 2021-09-18 LAB — URINALYSIS, COMPLETE
Bilirubin, UA: NEGATIVE
Glucose, UA: NEGATIVE
Ketones, UA: NEGATIVE
Nitrite, UA: POSITIVE — AB
Specific Gravity, UA: 1.015 (ref 1.005–1.030)
Urobilinogen, Ur: 0.2 mg/dL (ref 0.2–1.0)
pH, UA: 5 (ref 5.0–7.5)

## 2021-09-18 LAB — MICROSCOPIC EXAMINATION: WBC, UA: >30 /hpf — AB (ref 0–5)

## 2021-09-18 MED ORDER — NITROFURANTOIN MONOHYD MACRO 100 MG PO CAPS: 100.0000 mg | ORAL_CAPSULE | Freq: Two times a day (BID) | ORAL | 0 refills | Status: AC

## 2021-09-18 NOTE — Progress Notes (Signed)
09/18/21 2:58 PM   Abigail Hill [birth year] [id number]  Referring provider:  Baxter Hire, MD [place],   [address] Chief Complaint  Patient presents with   Recurrent UTI      HPI: Abigail Hill is a 71 y.o.female with a personal history of  with a personal history of chronic left UPJ obstruction with severe left hydronephrosis and urge incontinence who presents today for further evaluation of UTI.   She is undergoing conservative management of her chronic UPJ obstruction.   She was placed on suppressive antibiotics previously and finished these in May 2023. She is managed on estrogen 3x a week and preventive supplement cranberry tablets.   UA today shows nitrite positive, >30 WBCs, 11-30 RBCs, and many bacteria   She reports symptoms started Saturday morning she has urgency but denies any burning with urination.  She also has has not been feeling well.  She reports subjective low-grade temp on Monday.  PMH: Past Medical History:  Diagnosis Date   Abnormal gait 12/18/2015   BP (high blood pressure) 08/24/2015   Cancer ([employer])    skin   Constriction of ureter (postoperative) 07/28/2015   Hydronephrosis 07/14/2015   Hypertension    Left foot drop 12/18/2015   Recurrent UTI    UPJ (ureteropelvic junction) obstruction 07/28/2015   UTI (lower urinary tract infection) 07/14/2015    Surgical History: Past Surgical History:  Procedure Laterality Date   broken foot  2012   COLONOSCOPY WITH PROPOFOL N/A 06/30/2017   Procedure: COLONOSCOPY WITH PROPOFOL;  Surgeon: Lollie Sails, MD;  Location: [facility] ENDOSCOPY;  Service: Endoscopy;  Laterality: N/A;   kidney repair  1985   repaired torn blood vessel in kidney    Home Medications:  Allergies as of 09/18/2021       Reactions   Oxycodone         Medication List        Accurate as of September 18, 2021  2:58 PM. If you have any questions, ask your nurse or doctor.          alendronate 70 MG  tablet Commonly known as: FOSAMAX Take 70 mg by mouth every Monday.   aspirin 81 MG chewable tablet Chew 81 mg by mouth daily.   Cranberry 300 MG tablet Take 300 mg by mouth 2 (two) times daily.   estradiol 0.1 MG/GM vaginal cream Commonly known as: ESTRACE Place 1 Applicatorful vaginally at bedtime. Estrogen Cream Instruction Discard applicator Apply pea sized amount to tip of finger to urethra before bed. Wash hands well after application. Use everyday once a day for the first two weeks. Then 3 times a week.   Myrbetriq 50 MG Tb24 tablet Generic drug: mirabegron ER TAKE 1 TABLET BY MOUTH EVERY DAY   nitrofurantoin (macrocrystal-monohydrate) 100 MG capsule Commonly known as: MACROBID Take 1 capsule (100 mg total) by mouth every 12 (twelve) hours for 10 days.   Oyster Shell Calcium 500-400 MG-UNIT Tabs Take 1 tablet by mouth daily.   spironolactone 25 MG tablet Commonly known as: ALDACTONE Take 25 mg by mouth daily.   torsemide 20 MG tablet Commonly known as: DEMADEX Take by mouth.        Allergies:  Allergies  Allergen Reactions   Oxycodone     Family History: Family History  Problem Relation Age of Onset   Kidney disease Neg Hx    Bladder Cancer Neg Hx     Social History:  reports that  she has quit smoking. She has never used smokeless tobacco. She reports that she does not drink alcohol and does not use drugs.   Physical Exam: BP 130/81   Pulse 93   Ht '5\' 4"'$  (1.626 m)   Wt 153 lb (69.4 kg)   BMI 26.26 kg/m   Constitutional:  Alert and oriented, No acute distress. HEENT: [place] AT, moist mucus membranes.  Trachea midline, no masses. Cardiovascular: No clubbing, cyanosis, or edema. Respiratory: Normal respiratory effort, no increased work of breathing. Skin: No rashes, bruises or suspicious lesions. Neurologic: Grossly intact, no focal deficits, moving all 4 extremities. Psychiatric: Normal mood and affect.  Laboratory Data:  Lab Results  Component  Value Date   CREATININE 0.87 05/19/2019    Urinalysis Nitrite positive, >30 WBCs, 11-30 RBCs, many bacteria    Assessment & Plan:    Acute cystitis  - UA grossly positive today  - Will send urine for culture and treat in the interim with Macrobid 100 mg once daily for 10 days  2.  Recurrent UTIs - Continue current regimen, would like to avoid resuming suppressive antibiotics but if she continues to have frequent infections, may have to do so  [employer] as a scribe for Hollice Espy, MD.,have documented all relevant documentation on the behalf of Hollice Espy, MD,as directed by  Hollice Espy, MD while in the presence of Hollice Espy, [place] [address], [place], [place] [address] [phone]

## 2021-09-19 DIAGNOSIS — I110 Hypertensive heart disease with heart failure: Secondary | ICD-10-CM | POA: Diagnosis not present

## 2021-09-19 DIAGNOSIS — J9611 Chronic respiratory failure with hypoxia: Secondary | ICD-10-CM | POA: Diagnosis not present

## 2021-09-19 DIAGNOSIS — Z85828 Personal history of other malignant neoplasm of skin: Secondary | ICD-10-CM | POA: Diagnosis not present

## 2021-09-19 DIAGNOSIS — I700 Atherosclerosis of aorta: Secondary | ICD-10-CM | POA: Diagnosis not present

## 2021-09-19 DIAGNOSIS — Z7982 Long term (current) use of aspirin: Secondary | ICD-10-CM | POA: Diagnosis not present

## 2021-09-19 DIAGNOSIS — Z87891 Personal history of nicotine dependence: Secondary | ICD-10-CM | POA: Diagnosis not present

## 2021-09-19 DIAGNOSIS — I5032 Chronic diastolic (congestive) heart failure: Secondary | ICD-10-CM | POA: Diagnosis not present

## 2021-09-19 DIAGNOSIS — G912 (Idiopathic) normal pressure hydrocephalus: Secondary | ICD-10-CM | POA: Diagnosis not present

## 2021-09-19 DIAGNOSIS — J432 Centrilobular emphysema: Secondary | ICD-10-CM | POA: Diagnosis not present

## 2021-09-19 DIAGNOSIS — J9612 Chronic respiratory failure with hypercapnia: Secondary | ICD-10-CM | POA: Diagnosis not present

## 2021-09-19 DIAGNOSIS — M21372 Foot drop, left foot: Secondary | ICD-10-CM | POA: Diagnosis not present

## 2021-09-19 DIAGNOSIS — Z8673 Personal history of transient ischemic attack (TIA), and cerebral infarction without residual deficits: Secondary | ICD-10-CM | POA: Diagnosis not present

## 2021-09-19 DIAGNOSIS — Z9181 History of falling: Secondary | ICD-10-CM | POA: Diagnosis not present

## 2021-09-19 DIAGNOSIS — I2510 Atherosclerotic heart disease of native coronary artery without angina pectoris: Secondary | ICD-10-CM | POA: Diagnosis not present

## 2021-09-21 ENCOUNTER — Other Ambulatory Visit: Payer: Self-pay | Admitting: Urology

## 2021-09-25 LAB — CULTURE, URINE COMPREHENSIVE

## 2021-09-26 DIAGNOSIS — J9612 Chronic respiratory failure with hypercapnia: Secondary | ICD-10-CM | POA: Diagnosis not present

## 2021-09-26 DIAGNOSIS — I5032 Chronic diastolic (congestive) heart failure: Secondary | ICD-10-CM | POA: Diagnosis not present

## 2021-09-26 DIAGNOSIS — Z87891 Personal history of nicotine dependence: Secondary | ICD-10-CM | POA: Diagnosis not present

## 2021-09-26 DIAGNOSIS — J432 Centrilobular emphysema: Secondary | ICD-10-CM | POA: Diagnosis not present

## 2021-09-26 DIAGNOSIS — Z7982 Long term (current) use of aspirin: Secondary | ICD-10-CM | POA: Diagnosis not present

## 2021-09-26 DIAGNOSIS — Z8673 Personal history of transient ischemic attack (TIA), and cerebral infarction without residual deficits: Secondary | ICD-10-CM | POA: Diagnosis not present

## 2021-09-26 DIAGNOSIS — Z87311 Personal history of (healed) other pathological fracture: Secondary | ICD-10-CM | POA: Diagnosis not present

## 2021-09-26 DIAGNOSIS — J9611 Chronic respiratory failure with hypoxia: Secondary | ICD-10-CM | POA: Diagnosis not present

## 2021-09-26 DIAGNOSIS — Z9181 History of falling: Secondary | ICD-10-CM | POA: Diagnosis not present

## 2021-09-26 DIAGNOSIS — Z7983 Long term (current) use of bisphosphonates: Secondary | ICD-10-CM | POA: Diagnosis not present

## 2021-09-26 DIAGNOSIS — I110 Hypertensive heart disease with heart failure: Secondary | ICD-10-CM | POA: Diagnosis not present

## 2021-09-26 DIAGNOSIS — I2510 Atherosclerotic heart disease of native coronary artery without angina pectoris: Secondary | ICD-10-CM | POA: Diagnosis not present

## 2021-09-26 DIAGNOSIS — I700 Atherosclerosis of aorta: Secondary | ICD-10-CM | POA: Diagnosis not present

## 2021-09-26 DIAGNOSIS — Z85828 Personal history of other malignant neoplasm of skin: Secondary | ICD-10-CM | POA: Diagnosis not present

## 2021-09-26 DIAGNOSIS — G912 (Idiopathic) normal pressure hydrocephalus: Secondary | ICD-10-CM | POA: Diagnosis not present

## 2021-09-26 DIAGNOSIS — M21372 Foot drop, left foot: Secondary | ICD-10-CM | POA: Diagnosis not present

## 2021-10-01 DIAGNOSIS — I110 Hypertensive heart disease with heart failure: Secondary | ICD-10-CM | POA: Diagnosis not present

## 2021-10-01 DIAGNOSIS — M21372 Foot drop, left foot: Secondary | ICD-10-CM | POA: Diagnosis not present

## 2021-10-01 DIAGNOSIS — Z7982 Long term (current) use of aspirin: Secondary | ICD-10-CM | POA: Diagnosis not present

## 2021-10-01 DIAGNOSIS — Z7983 Long term (current) use of bisphosphonates: Secondary | ICD-10-CM | POA: Diagnosis not present

## 2021-10-01 DIAGNOSIS — J9611 Chronic respiratory failure with hypoxia: Secondary | ICD-10-CM | POA: Diagnosis not present

## 2021-10-01 DIAGNOSIS — Z9181 History of falling: Secondary | ICD-10-CM | POA: Diagnosis not present

## 2021-10-01 DIAGNOSIS — J432 Centrilobular emphysema: Secondary | ICD-10-CM | POA: Diagnosis not present

## 2021-10-01 DIAGNOSIS — Z87891 Personal history of nicotine dependence: Secondary | ICD-10-CM | POA: Diagnosis not present

## 2021-10-01 DIAGNOSIS — I2510 Atherosclerotic heart disease of native coronary artery without angina pectoris: Secondary | ICD-10-CM | POA: Diagnosis not present

## 2021-10-01 DIAGNOSIS — I700 Atherosclerosis of aorta: Secondary | ICD-10-CM | POA: Diagnosis not present

## 2021-10-01 DIAGNOSIS — I5032 Chronic diastolic (congestive) heart failure: Secondary | ICD-10-CM | POA: Diagnosis not present

## 2021-10-01 DIAGNOSIS — Z85828 Personal history of other malignant neoplasm of skin: Secondary | ICD-10-CM | POA: Diagnosis not present

## 2021-10-01 DIAGNOSIS — J9612 Chronic respiratory failure with hypercapnia: Secondary | ICD-10-CM | POA: Diagnosis not present

## 2021-10-01 DIAGNOSIS — Z8673 Personal history of transient ischemic attack (TIA), and cerebral infarction without residual deficits: Secondary | ICD-10-CM | POA: Diagnosis not present

## 2021-10-01 DIAGNOSIS — G912 (Idiopathic) normal pressure hydrocephalus: Secondary | ICD-10-CM | POA: Diagnosis not present

## 2021-10-01 DIAGNOSIS — Z87311 Personal history of (healed) other pathological fracture: Secondary | ICD-10-CM | POA: Diagnosis not present

## 2021-10-03 DIAGNOSIS — I700 Atherosclerosis of aorta: Secondary | ICD-10-CM | POA: Diagnosis not present

## 2021-10-03 DIAGNOSIS — J432 Centrilobular emphysema: Secondary | ICD-10-CM | POA: Diagnosis not present

## 2021-10-03 DIAGNOSIS — I2510 Atherosclerotic heart disease of native coronary artery without angina pectoris: Secondary | ICD-10-CM | POA: Diagnosis not present

## 2021-10-03 DIAGNOSIS — I110 Hypertensive heart disease with heart failure: Secondary | ICD-10-CM | POA: Diagnosis not present

## 2021-10-03 DIAGNOSIS — G912 (Idiopathic) normal pressure hydrocephalus: Secondary | ICD-10-CM | POA: Diagnosis not present

## 2021-10-03 DIAGNOSIS — J9611 Chronic respiratory failure with hypoxia: Secondary | ICD-10-CM | POA: Diagnosis not present

## 2021-10-03 DIAGNOSIS — I5032 Chronic diastolic (congestive) heart failure: Secondary | ICD-10-CM | POA: Diagnosis not present

## 2021-10-03 DIAGNOSIS — J9612 Chronic respiratory failure with hypercapnia: Secondary | ICD-10-CM | POA: Diagnosis not present

## 2021-10-17 DIAGNOSIS — Z9181 History of falling: Secondary | ICD-10-CM | POA: Diagnosis not present

## 2021-10-17 DIAGNOSIS — J9612 Chronic respiratory failure with hypercapnia: Secondary | ICD-10-CM | POA: Diagnosis not present

## 2021-10-17 DIAGNOSIS — Z87891 Personal history of nicotine dependence: Secondary | ICD-10-CM | POA: Diagnosis not present

## 2021-10-17 DIAGNOSIS — G912 (Idiopathic) normal pressure hydrocephalus: Secondary | ICD-10-CM | POA: Diagnosis not present

## 2021-10-17 DIAGNOSIS — Z7982 Long term (current) use of aspirin: Secondary | ICD-10-CM | POA: Diagnosis not present

## 2021-10-17 DIAGNOSIS — J432 Centrilobular emphysema: Secondary | ICD-10-CM | POA: Diagnosis not present

## 2021-10-17 DIAGNOSIS — Z85828 Personal history of other malignant neoplasm of skin: Secondary | ICD-10-CM | POA: Diagnosis not present

## 2021-10-17 DIAGNOSIS — I700 Atherosclerosis of aorta: Secondary | ICD-10-CM | POA: Diagnosis not present

## 2021-10-17 DIAGNOSIS — I2510 Atherosclerotic heart disease of native coronary artery without angina pectoris: Secondary | ICD-10-CM | POA: Diagnosis not present

## 2021-10-17 DIAGNOSIS — Z87311 Personal history of (healed) other pathological fracture: Secondary | ICD-10-CM | POA: Diagnosis not present

## 2021-10-17 DIAGNOSIS — J9611 Chronic respiratory failure with hypoxia: Secondary | ICD-10-CM | POA: Diagnosis not present

## 2021-10-17 DIAGNOSIS — M21372 Foot drop, left foot: Secondary | ICD-10-CM | POA: Diagnosis not present

## 2021-10-17 DIAGNOSIS — Z7983 Long term (current) use of bisphosphonates: Secondary | ICD-10-CM | POA: Diagnosis not present

## 2021-10-17 DIAGNOSIS — Z8673 Personal history of transient ischemic attack (TIA), and cerebral infarction without residual deficits: Secondary | ICD-10-CM | POA: Diagnosis not present

## 2021-10-17 DIAGNOSIS — I110 Hypertensive heart disease with heart failure: Secondary | ICD-10-CM | POA: Diagnosis not present

## 2021-10-17 DIAGNOSIS — I5032 Chronic diastolic (congestive) heart failure: Secondary | ICD-10-CM | POA: Diagnosis not present

## 2021-10-23 DIAGNOSIS — J42 Unspecified chronic bronchitis: Secondary | ICD-10-CM | POA: Diagnosis not present

## 2021-10-29 DIAGNOSIS — Z9181 History of falling: Secondary | ICD-10-CM | POA: Diagnosis not present

## 2021-10-29 DIAGNOSIS — I5032 Chronic diastolic (congestive) heart failure: Secondary | ICD-10-CM | POA: Diagnosis not present

## 2021-10-29 DIAGNOSIS — Z87891 Personal history of nicotine dependence: Secondary | ICD-10-CM | POA: Diagnosis not present

## 2021-10-29 DIAGNOSIS — Z7983 Long term (current) use of bisphosphonates: Secondary | ICD-10-CM | POA: Diagnosis not present

## 2021-10-29 DIAGNOSIS — I2510 Atherosclerotic heart disease of native coronary artery without angina pectoris: Secondary | ICD-10-CM | POA: Diagnosis not present

## 2021-10-29 DIAGNOSIS — M21372 Foot drop, left foot: Secondary | ICD-10-CM | POA: Diagnosis not present

## 2021-10-29 DIAGNOSIS — J9611 Chronic respiratory failure with hypoxia: Secondary | ICD-10-CM | POA: Diagnosis not present

## 2021-10-29 DIAGNOSIS — I700 Atherosclerosis of aorta: Secondary | ICD-10-CM | POA: Diagnosis not present

## 2021-10-29 DIAGNOSIS — Z85828 Personal history of other malignant neoplasm of skin: Secondary | ICD-10-CM | POA: Diagnosis not present

## 2021-10-29 DIAGNOSIS — I110 Hypertensive heart disease with heart failure: Secondary | ICD-10-CM | POA: Diagnosis not present

## 2021-10-29 DIAGNOSIS — Z8673 Personal history of transient ischemic attack (TIA), and cerebral infarction without residual deficits: Secondary | ICD-10-CM | POA: Diagnosis not present

## 2021-10-29 DIAGNOSIS — Z7982 Long term (current) use of aspirin: Secondary | ICD-10-CM | POA: Diagnosis not present

## 2021-10-29 DIAGNOSIS — J432 Centrilobular emphysema: Secondary | ICD-10-CM | POA: Diagnosis not present

## 2021-10-29 DIAGNOSIS — J9612 Chronic respiratory failure with hypercapnia: Secondary | ICD-10-CM | POA: Diagnosis not present

## 2021-10-29 DIAGNOSIS — Z87311 Personal history of (healed) other pathological fracture: Secondary | ICD-10-CM | POA: Diagnosis not present

## 2021-10-29 DIAGNOSIS — G912 (Idiopathic) normal pressure hydrocephalus: Secondary | ICD-10-CM | POA: Diagnosis not present

## 2021-11-05 DIAGNOSIS — Z87311 Personal history of (healed) other pathological fracture: Secondary | ICD-10-CM | POA: Diagnosis not present

## 2021-11-05 DIAGNOSIS — I2510 Atherosclerotic heart disease of native coronary artery without angina pectoris: Secondary | ICD-10-CM | POA: Diagnosis not present

## 2021-11-05 DIAGNOSIS — J9611 Chronic respiratory failure with hypoxia: Secondary | ICD-10-CM | POA: Diagnosis not present

## 2021-11-05 DIAGNOSIS — Z7983 Long term (current) use of bisphosphonates: Secondary | ICD-10-CM | POA: Diagnosis not present

## 2021-11-05 DIAGNOSIS — Z7982 Long term (current) use of aspirin: Secondary | ICD-10-CM | POA: Diagnosis not present

## 2021-11-05 DIAGNOSIS — G912 (Idiopathic) normal pressure hydrocephalus: Secondary | ICD-10-CM | POA: Diagnosis not present

## 2021-11-05 DIAGNOSIS — Z9181 History of falling: Secondary | ICD-10-CM | POA: Diagnosis not present

## 2021-11-05 DIAGNOSIS — Z85828 Personal history of other malignant neoplasm of skin: Secondary | ICD-10-CM | POA: Diagnosis not present

## 2021-11-05 DIAGNOSIS — Z8673 Personal history of transient ischemic attack (TIA), and cerebral infarction without residual deficits: Secondary | ICD-10-CM | POA: Diagnosis not present

## 2021-11-05 DIAGNOSIS — I110 Hypertensive heart disease with heart failure: Secondary | ICD-10-CM | POA: Diagnosis not present

## 2021-11-05 DIAGNOSIS — J432 Centrilobular emphysema: Secondary | ICD-10-CM | POA: Diagnosis not present

## 2021-11-05 DIAGNOSIS — J9612 Chronic respiratory failure with hypercapnia: Secondary | ICD-10-CM | POA: Diagnosis not present

## 2021-11-05 DIAGNOSIS — I700 Atherosclerosis of aorta: Secondary | ICD-10-CM | POA: Diagnosis not present

## 2021-11-05 DIAGNOSIS — M21372 Foot drop, left foot: Secondary | ICD-10-CM | POA: Diagnosis not present

## 2021-11-05 DIAGNOSIS — I5032 Chronic diastolic (congestive) heart failure: Secondary | ICD-10-CM | POA: Diagnosis not present

## 2021-11-05 DIAGNOSIS — Z87891 Personal history of nicotine dependence: Secondary | ICD-10-CM | POA: Diagnosis not present

## 2021-11-07 DIAGNOSIS — I10 Essential (primary) hypertension: Secondary | ICD-10-CM | POA: Diagnosis not present

## 2021-11-07 DIAGNOSIS — N1832 Chronic kidney disease, stage 3b: Secondary | ICD-10-CM | POA: Diagnosis not present

## 2021-11-12 DIAGNOSIS — I5032 Chronic diastolic (congestive) heart failure: Secondary | ICD-10-CM | POA: Diagnosis not present

## 2021-11-12 DIAGNOSIS — I110 Hypertensive heart disease with heart failure: Secondary | ICD-10-CM | POA: Diagnosis not present

## 2021-11-12 DIAGNOSIS — I700 Atherosclerosis of aorta: Secondary | ICD-10-CM | POA: Diagnosis not present

## 2021-11-12 DIAGNOSIS — M21372 Foot drop, left foot: Secondary | ICD-10-CM | POA: Diagnosis not present

## 2021-11-12 DIAGNOSIS — Z85828 Personal history of other malignant neoplasm of skin: Secondary | ICD-10-CM | POA: Diagnosis not present

## 2021-11-12 DIAGNOSIS — Z87891 Personal history of nicotine dependence: Secondary | ICD-10-CM | POA: Diagnosis not present

## 2021-11-12 DIAGNOSIS — Z8673 Personal history of transient ischemic attack (TIA), and cerebral infarction without residual deficits: Secondary | ICD-10-CM | POA: Diagnosis not present

## 2021-11-12 DIAGNOSIS — I2510 Atherosclerotic heart disease of native coronary artery without angina pectoris: Secondary | ICD-10-CM | POA: Diagnosis not present

## 2021-11-12 DIAGNOSIS — Z9181 History of falling: Secondary | ICD-10-CM | POA: Diagnosis not present

## 2021-11-12 DIAGNOSIS — Z87311 Personal history of (healed) other pathological fracture: Secondary | ICD-10-CM | POA: Diagnosis not present

## 2021-11-12 DIAGNOSIS — Z7982 Long term (current) use of aspirin: Secondary | ICD-10-CM | POA: Diagnosis not present

## 2021-11-12 DIAGNOSIS — G912 (Idiopathic) normal pressure hydrocephalus: Secondary | ICD-10-CM | POA: Diagnosis not present

## 2021-11-12 DIAGNOSIS — J9612 Chronic respiratory failure with hypercapnia: Secondary | ICD-10-CM | POA: Diagnosis not present

## 2021-11-12 DIAGNOSIS — J9611 Chronic respiratory failure with hypoxia: Secondary | ICD-10-CM | POA: Diagnosis not present

## 2021-11-12 DIAGNOSIS — Z7983 Long term (current) use of bisphosphonates: Secondary | ICD-10-CM | POA: Diagnosis not present

## 2021-11-12 DIAGNOSIS — J432 Centrilobular emphysema: Secondary | ICD-10-CM | POA: Diagnosis not present

## 2021-11-14 DIAGNOSIS — G912 (Idiopathic) normal pressure hydrocephalus: Secondary | ICD-10-CM | POA: Diagnosis not present

## 2021-11-14 DIAGNOSIS — Z23 Encounter for immunization: Secondary | ICD-10-CM | POA: Diagnosis not present

## 2021-11-14 DIAGNOSIS — Z0001 Encounter for general adult medical examination with abnormal findings: Secondary | ICD-10-CM | POA: Diagnosis not present

## 2021-11-14 DIAGNOSIS — N1832 Chronic kidney disease, stage 3b: Secondary | ICD-10-CM | POA: Diagnosis not present

## 2021-11-14 DIAGNOSIS — J432 Centrilobular emphysema: Secondary | ICD-10-CM | POA: Diagnosis not present

## 2021-11-14 DIAGNOSIS — I10 Essential (primary) hypertension: Secondary | ICD-10-CM | POA: Diagnosis not present

## 2021-11-14 DIAGNOSIS — Z1231 Encounter for screening mammogram for malignant neoplasm of breast: Secondary | ICD-10-CM | POA: Diagnosis not present

## 2021-11-14 DIAGNOSIS — J9611 Chronic respiratory failure with hypoxia: Secondary | ICD-10-CM | POA: Diagnosis not present

## 2021-11-20 DIAGNOSIS — M21372 Foot drop, left foot: Secondary | ICD-10-CM | POA: Diagnosis not present

## 2021-11-20 DIAGNOSIS — J432 Centrilobular emphysema: Secondary | ICD-10-CM | POA: Diagnosis not present

## 2021-11-20 DIAGNOSIS — Z85828 Personal history of other malignant neoplasm of skin: Secondary | ICD-10-CM | POA: Diagnosis not present

## 2021-11-20 DIAGNOSIS — J9612 Chronic respiratory failure with hypercapnia: Secondary | ICD-10-CM | POA: Diagnosis not present

## 2021-11-20 DIAGNOSIS — G912 (Idiopathic) normal pressure hydrocephalus: Secondary | ICD-10-CM | POA: Diagnosis not present

## 2021-11-20 DIAGNOSIS — Z7982 Long term (current) use of aspirin: Secondary | ICD-10-CM | POA: Diagnosis not present

## 2021-11-20 DIAGNOSIS — I5032 Chronic diastolic (congestive) heart failure: Secondary | ICD-10-CM | POA: Diagnosis not present

## 2021-11-20 DIAGNOSIS — Z87311 Personal history of (healed) other pathological fracture: Secondary | ICD-10-CM | POA: Diagnosis not present

## 2021-11-20 DIAGNOSIS — J9611 Chronic respiratory failure with hypoxia: Secondary | ICD-10-CM | POA: Diagnosis not present

## 2021-11-20 DIAGNOSIS — Z9181 History of falling: Secondary | ICD-10-CM | POA: Diagnosis not present

## 2021-11-20 DIAGNOSIS — Z87891 Personal history of nicotine dependence: Secondary | ICD-10-CM | POA: Diagnosis not present

## 2021-11-20 DIAGNOSIS — Z8673 Personal history of transient ischemic attack (TIA), and cerebral infarction without residual deficits: Secondary | ICD-10-CM | POA: Diagnosis not present

## 2021-11-20 DIAGNOSIS — Z7983 Long term (current) use of bisphosphonates: Secondary | ICD-10-CM | POA: Diagnosis not present

## 2021-11-20 DIAGNOSIS — I2510 Atherosclerotic heart disease of native coronary artery without angina pectoris: Secondary | ICD-10-CM | POA: Diagnosis not present

## 2021-11-20 DIAGNOSIS — I110 Hypertensive heart disease with heart failure: Secondary | ICD-10-CM | POA: Diagnosis not present

## 2021-11-20 DIAGNOSIS — I700 Atherosclerosis of aorta: Secondary | ICD-10-CM | POA: Diagnosis not present

## 2021-11-21 ENCOUNTER — Encounter (INDEPENDENT_AMBULATORY_CARE_PROVIDER_SITE_OTHER): Payer: PPO | Admitting: Ophthalmology

## 2021-11-21 DIAGNOSIS — H25013 Cortical age-related cataract, bilateral: Secondary | ICD-10-CM | POA: Diagnosis not present

## 2021-11-21 DIAGNOSIS — H353131 Nonexudative age-related macular degeneration, bilateral, early dry stage: Secondary | ICD-10-CM | POA: Diagnosis not present

## 2021-11-21 DIAGNOSIS — H2513 Age-related nuclear cataract, bilateral: Secondary | ICD-10-CM | POA: Diagnosis not present

## 2021-11-21 DIAGNOSIS — H25043 Posterior subcapsular polar age-related cataract, bilateral: Secondary | ICD-10-CM | POA: Diagnosis not present

## 2021-11-21 DIAGNOSIS — H16223 Keratoconjunctivitis sicca, not specified as Sjogren's, bilateral: Secondary | ICD-10-CM | POA: Diagnosis not present

## 2021-11-21 DIAGNOSIS — H2512 Age-related nuclear cataract, left eye: Secondary | ICD-10-CM | POA: Diagnosis not present

## 2021-11-25 ENCOUNTER — Encounter (INDEPENDENT_AMBULATORY_CARE_PROVIDER_SITE_OTHER): Payer: PPO | Admitting: Ophthalmology

## 2021-11-25 ENCOUNTER — Encounter (INDEPENDENT_AMBULATORY_CARE_PROVIDER_SITE_OTHER): Payer: Self-pay | Admitting: Ophthalmology

## 2021-11-25 ENCOUNTER — Ambulatory Visit (INDEPENDENT_AMBULATORY_CARE_PROVIDER_SITE_OTHER): Payer: PPO | Admitting: Ophthalmology

## 2021-11-25 DIAGNOSIS — H353132 Nonexudative age-related macular degeneration, bilateral, intermediate dry stage: Secondary | ICD-10-CM

## 2021-11-25 DIAGNOSIS — H35722 Serous detachment of retinal pigment epithelium, left eye: Secondary | ICD-10-CM | POA: Diagnosis not present

## 2021-11-25 DIAGNOSIS — H2513 Age-related nuclear cataract, bilateral: Secondary | ICD-10-CM

## 2021-11-25 DIAGNOSIS — H353221 Exudative age-related macular degeneration, left eye, with active choroidal neovascularization: Secondary | ICD-10-CM

## 2021-11-25 MED ORDER — BEVACIZUMAB CHEMO INJECTION 1.25MG/0.05ML SYRINGE FOR KALEIDOSCOPE
2.5000 mg | INTRAVITREAL | Status: AC | PRN
  Administered 2021-11-25: 2.5 mg via INTRAVITREAL

## 2021-11-25 NOTE — Assessment & Plan Note (Signed)
Observe OD at this time

## 2021-11-25 NOTE — Progress Notes (Signed)
11/25/2021     CHIEF COMPLAINT Patient presents for  Chief Complaint  Patient presents with   Retina Evaluation      HISTORY OF PRESENT ILLNESS: Abigail Hill is a 71 y.o. female who presents to the clinic today for:   HPI     Retina Evaluation           Laterality: both eyes   Associated Symptoms: Negative for Flashes, Floaters, Distortion, Blind Spot, Pain, Redness, Photophobia, Glare, Trauma, Scalp Tenderness, Jaw Claudication, Shoulder/Hip pain, Fever, Weight Loss and Fatigue         Comments   NP- RETINAL CLEARANCE FOR CATARACT SX REF BY DR. Talbert Forest. Pt stated, "My vision is doing pretty well. I would say my left eye is my bad eye. When I was driving at night, the bright lights coming at me used to bother me. But other than that, I don't really have any problems with my vision. I'm scheduled to have my left eye done in November 15th by Dr. Talbert Forest. The one for my right eye is scheduled for November 29th."       Last edited by Silvestre Moment on 11/25/2021  1:21 PM.      Referring physician: Darleen Crocker, MD [address] [place],  [place] [address]  HISTORICAL INFORMATION:   Selected notes from the [facility]: No current outpatient medications on file. (Ophthalmic Drugs)   No current facility-administered medications for this visit. (Ophthalmic Drugs)   Current Outpatient Medications (Other)  Medication Sig   alendronate (FOSAMAX) 70 MG tablet Take 70 mg by mouth every Monday.   aspirin 81 MG chewable tablet Chew 81 mg by mouth daily.   Calcium Carb-Cholecalciferol (OYSTER SHELL CALCIUM) 500-400 MG-UNIT TABS Take 1 tablet by mouth daily.   Cranberry 300 MG tablet Take 300 mg by mouth 2 (two) times daily.   estradiol (ESTRACE) 0.1 MG/GM vaginal cream Place 1 Applicatorful vaginally at bedtime. Estrogen Cream Instruction Discard applicator Apply pea sized amount to tip of finger to urethra before bed. Wash hands well after  application. Use everyday once a day for the first two weeks. Then 3 times a week.   MYRBETRIQ 50 MG TB24 tablet TAKE 1 TABLET BY MOUTH EVERY DAY   spironolactone (ALDACTONE) 25 MG tablet Take 25 mg by mouth daily.   torsemide (DEMADEX) 20 MG tablet Take by mouth.   No current facility-administered medications for this visit. (Other)      REVIEW OF SYSTEMS: ROS   Negative for: Constitutional, Gastrointestinal, Neurological, Skin, Genitourinary, Musculoskeletal, HENT, Endocrine, Cardiovascular, Eyes, Respiratory, Psychiatric, Allergic/Imm, Heme/Lymph Last edited by Silvestre Moment on 11/25/2021  1:21 PM.       ALLERGIES Allergies  Allergen Reactions   Oxycodone     PAST MEDICAL HISTORY Past Medical History:  Diagnosis Date   Abnormal gait 12/18/2015   BP (high blood pressure) 08/24/2015   Cancer ([place])    skin   Constriction of ureter (postoperative) 07/28/2015   Hydronephrosis 07/14/2015   Hypertension    Left foot drop 12/18/2015   Recurrent UTI    UPJ (ureteropelvic junction) obstruction 07/28/2015   UTI (lower urinary tract infection) 07/14/2015   Past Surgical History:  Procedure Laterality Date   broken foot  2012   COLONOSCOPY WITH PROPOFOL N/A 06/30/2017   Procedure: COLONOSCOPY WITH PROPOFOL;  Surgeon: Lollie Sails, MD;  Location: [facility] ENDOSCOPY;  Service: Endoscopy;  Laterality: N/A;  kidney repair  1985   repaired torn blood vessel in kidney    FAMILY HISTORY Family History  Problem Relation Age of Onset   Kidney disease Neg Hx    Bladder Cancer Neg Hx     SOCIAL HISTORY Social History   Tobacco Use   Smoking status: Former   Smokeless tobacco: Never   Tobacco comments:    quit 10 years  Vaping Use   Vaping Use: Never used  Substance Use Topics   Alcohol use: Never    Alcohol/week: 0.0 standard drinks of alcohol   Drug use: No         OPHTHALMIC EXAM:  Base Eye Exam     Visual Acuity (ETDRS)       Right Left   Dist cc 20/30 20/30 -1 +2    Dist ph cc 20/25 +2 NI    Correction: Glasses         Tonometry (Tonopen, 1:27 PM)       Right Left   Pressure 15 15         Pupils       Pupils APD   Right PERRL None   Left PERRL None         Visual Fields       Left Right    Full Full         Extraocular Movement       Right Left    Full Full         Neuro/Psych     Oriented x3: Yes         Dilation     Both eyes: 1.0% Mydriacyl, 2.5% Phenylephrine @ 1:27 PM           Slit Lamp and Fundus Exam     External Exam       Right Left   External Normal Normal         Slit Lamp Exam       Right Left   Lids/Lashes Normal Normal   Conjunctiva/Sclera White and quiet White and quiet   Cornea Clear Clear   Anterior Chamber Deep and quiet Deep and quiet   Iris Round and reactive Round and reactive   Lens 2+ Nuclear sclerosis 2+ Nuclear sclerosis   Anterior Vitreous Normal Normal         Fundus Exam       Right Left   Posterior Vitreous Posterior vitreous detachment Normal   Disc Normal Normal   C/D Ratio 0.4 0.4   Macula Normal Normal   Vessels Normal Normal   Periphery Normal, no holes or tear Normal, no holes or tear            IMAGING AND PROCEDURES  Imaging and Procedures for 11/25/21  OCT, Retina - OU - Both Eyes       Right Eye Quality was good. Scan locations included subfoveal. Central Foveal Thickness: 262. Findings include normal foveal contour, no IRF, no SRF, retinal drusen .   Left Eye Quality was good. Scan locations included subfoveal. Central Foveal Thickness: 263. Progression has no prior data. Findings include retinal drusen , subretinal fluid.   Notes Serous retinal detachment nasal to faz     Color Fundus Photography Optos - OU - Both Eyes       Right Eye Progression has no prior data. Disc findings include normal observations. Macula : drusen. Vessels : normal observations. Periphery : normal observations.   Left Eye Progression has no  prior data. Disc findings include normal observations. Macula : drusen.   Notes OU with signs of intermediate AMD.  Serous retinal detachment seen nasally and to the FAZ OS not seen on color fundus photography     Intravitreal Injection, Pharmacologic Agent - OS - Left Eye       Time Out 11/25/2021. 2:22 PM. Confirmed correct patient, procedure, site, and patient consented.   Anesthesia Topical anesthesia was used. Anesthetic medications included Lidocaine 4%.   Procedure Preparation included 5% betadine to ocular surface, 10% betadine to eyelids. A 30 gauge needle was used.   Injection: 2.5 mg Bevacizumab 1.'25mg'$ /0.36m   Route: Intravitreal, Site: Left Eye   NDC: [id number] Lot: [id number]  Post-op Post injection exam found visual acuity of at least counting fingers. The patient tolerated the procedure well. There were no complications. The patient received written and verbal post procedure care education. Post injection medications included ocuflox.              ASSESSMENT/PLAN:  Nuclear sclerotic cataract of both eyes Okay to proceed with cataract surgery as scheduled  Exudative age-related macular degeneration of left eye with active choroidal neovascularization ([place] The nature of wet macular degeneration was discussed with the patient.  Forms of therapy reviewed include the use of Anti-VEGF medications injected painlessly into the eye, as well as other possible treatment modalities, including thermal laser therapy. Fellow eye involvement and risks were discussed with the patient. Upon the finding of wet age related macular degeneration, treatment will be offered. The treatment regimen is on a treat as needed basis with the intent to treat if necessary and extend interval of exams when possible. On average 1 out of 6 patients do not need lifetime therapy. However, the risk of recurrent disease is high for a lifetime.  Initially monthly, then periodic, examinations and  evaluations will determine whether the next treatment is required on the day of the examination.  I explained the patient that wet AMD is present in the left eye.  I explained to her that this is like having a fire break out in your house.  In that medication delivery today and every 6 to 7 weeks will be effective at halting controlling the condition and potentially preserving vision.  (The house fire analogy)  Intermediate stage nonexudative age-related macular degeneration of both eyes Observe OD at this time  Retinal pigment epithelial detachment, left Component of wet AMD OS     ICD-10-CM   1. Exudative age-related macular degeneration of left eye with active choroidal neovascularization (HCC)  H35.3221 OCT, Retina - OU - Both Eyes    Color Fundus Photography Optos - OU - Both Eyes    Intravitreal Injection, Pharmacologic Agent - OS - Left Eye    Bevacizumab (AVASTIN) SOLN 2.5 mg    2. Nuclear sclerotic cataract of both eyes  H25.13     3. Intermediate stage nonexudative age-related macular degeneration of both eyes  H35.3132     4. Retinal pigment epithelial detachment, left  H35.722       1.  OS with commence with intravitreal Avastin today.  Follow-up next in 6 to 7 weeks.  Repeat injection OS.  2.  Informed that we are going to recredentialing at this point.  To call 2 weeks prior to next appointment to confirm or to make arrangements for another provider which we will assist in  3.  Okay to proceed with Dr. BTalbert Forestwith cataract surgery at any time I do suggest  right eye first  Ophthalmic Meds Ordered this visit:  Meds ordered this encounter  Medications   Bevacizumab (AVASTIN) SOLN 2.5 mg       Return in about 7 weeks (around 01/13/2022) for dilate, OS, AVASTIN OCT.  Patient Instructions  I informed the patient that injection today is required  Also informed the patient and family that my relationship with [facility] ends on 12-05-2021.  This is  triggering the need for recredentialing and resigning up with insurance companies.  I making every effort to maintain my affiliation with her insurance, health team advantage.  But that is pending as of the moment we speak  Patient instructed to call 2 weeks prior to next appointment to confirm that we can potentially see her as a patient during that time per her return in 6 weeks   Explained the diagnoses, plan, and follow up with the patient and they expressed understanding.  Patient expressed understanding of the importance of proper follow up care.   Clent Demark Dawana Asper M.D. Diseases & Surgery of the Retina and Vitreous [facility] 11/25/21     Abbreviations: M myopia (nearsighted); A astigmatism; H hyperopia (farsighted); P presbyopia; Mrx spectacle prescription;  CTL contact lenses; OD right eye; OS left eye; OU both eyes  XT exotropia; ET esotropia; PEK punctate epithelial keratitis; PEE punctate epithelial erosions; DES dry eye syndrome; MGD meibomian gland dysfunction; ATs artificial tears; PFAT's preservative free artificial tears; [place] nuclear sclerotic cataract; PSC posterior subcapsular cataract; ERM epi-retinal membrane; PVD posterior vitreous detachment; RD retinal detachment; DM diabetes mellitus; DR diabetic retinopathy; NPDR non-proliferative diabetic retinopathy; PDR proliferative diabetic retinopathy; CSME clinically significant macular edema; DME diabetic macular edema; dbh dot blot hemorrhages; CWS cotton wool spot; POAG primary open angle glaucoma; C/D cup-to-disc ratio; HVF humphrey visual field; GVF goldmann visual field; OCT optical coherence tomography; IOP intraocular pressure; BRVO Branch retinal vein occlusion; CRVO central retinal vein occlusion; CRAO central retinal artery occlusion; BRAO branch retinal artery occlusion; RT retinal tear; SB scleral buckle; PPV pars plana vitrectomy; VH Vitreous hemorrhage; PRP panretinal laser photocoagulation; IVK  intravitreal kenalog; VMT vitreomacular traction; MH Macular hole;  NVD neovascularization of the disc; NVE neovascularization elsewhere; AREDS age related eye disease study; ARMD age related macular degeneration; POAG primary open angle glaucoma; EBMD epithelial/anterior basement membrane dystrophy; ACIOL anterior chamber intraocular lens; IOL intraocular lens; PCIOL posterior chamber intraocular lens; Phaco/IOL phacoemulsification with intraocular lens placement; [place] photorefractive keratectomy; LASIK laser assisted in situ keratomileusis; HTN hypertension; DM diabetes mellitus; COPD chronic obstructive pulmonary disease

## 2021-11-25 NOTE — Patient Instructions (Signed)
I informed the patient that injection today is required  Also informed the patient and family that my relationship with [facility] ends on 12-05-2021.  This is triggering the need for recredentialing and resigning up with insurance companies.  I making every effort to maintain my affiliation with her insurance, health team advantage.  But that is pending as of the moment we speak  Patient instructed to call 2 weeks prior to next appointment to confirm that we can potentially see her as a patient during that time per her return in 6 weeks

## 2021-11-25 NOTE — Assessment & Plan Note (Signed)
The nature of wet macular degeneration was discussed with the patient.  Forms of therapy reviewed include the use of Anti-VEGF medications injected painlessly into the eye, as well as other possible treatment modalities, including thermal laser therapy. Fellow eye involvement and risks were discussed with the patient. Upon the finding of wet age related macular degeneration, treatment will be offered. The treatment regimen is on a treat as needed basis with the intent to treat if necessary and extend interval of exams when possible. On average 1 out of 6 patients do not need lifetime therapy. However, the risk of recurrent disease is high for a lifetime.  Initially monthly, then periodic, examinations and evaluations will determine whether the next treatment is required on the day of the examination.  I explained the patient that wet AMD is present in the left eye.  I explained to her that this is like having a fire break out in your house.  In that medication delivery today and every 6 to 7 weeks will be effective at halting controlling the condition and potentially preserving vision.  (The house fire analogy)

## 2021-11-25 NOTE — Assessment & Plan Note (Signed)
Okay to proceed with cataract surgery as scheduled

## 2021-11-25 NOTE — Assessment & Plan Note (Signed)
Component of wet AMD OS

## 2021-11-29 DIAGNOSIS — Z87311 Personal history of (healed) other pathological fracture: Secondary | ICD-10-CM | POA: Diagnosis not present

## 2021-11-29 DIAGNOSIS — I2510 Atherosclerotic heart disease of native coronary artery without angina pectoris: Secondary | ICD-10-CM | POA: Diagnosis not present

## 2021-11-29 DIAGNOSIS — I700 Atherosclerosis of aorta: Secondary | ICD-10-CM | POA: Diagnosis not present

## 2021-11-29 DIAGNOSIS — Z8673 Personal history of transient ischemic attack (TIA), and cerebral infarction without residual deficits: Secondary | ICD-10-CM | POA: Diagnosis not present

## 2021-11-29 DIAGNOSIS — I5032 Chronic diastolic (congestive) heart failure: Secondary | ICD-10-CM | POA: Diagnosis not present

## 2021-11-29 DIAGNOSIS — G912 (Idiopathic) normal pressure hydrocephalus: Secondary | ICD-10-CM | POA: Diagnosis not present

## 2021-11-29 DIAGNOSIS — Z7983 Long term (current) use of bisphosphonates: Secondary | ICD-10-CM | POA: Diagnosis not present

## 2021-11-29 DIAGNOSIS — I110 Hypertensive heart disease with heart failure: Secondary | ICD-10-CM | POA: Diagnosis not present

## 2021-11-29 DIAGNOSIS — M21372 Foot drop, left foot: Secondary | ICD-10-CM | POA: Diagnosis not present

## 2021-11-29 DIAGNOSIS — Z7982 Long term (current) use of aspirin: Secondary | ICD-10-CM | POA: Diagnosis not present

## 2021-11-29 DIAGNOSIS — J432 Centrilobular emphysema: Secondary | ICD-10-CM | POA: Diagnosis not present

## 2021-11-29 DIAGNOSIS — J9611 Chronic respiratory failure with hypoxia: Secondary | ICD-10-CM | POA: Diagnosis not present

## 2021-11-29 DIAGNOSIS — Z9181 History of falling: Secondary | ICD-10-CM | POA: Diagnosis not present

## 2021-11-29 DIAGNOSIS — Z85828 Personal history of other malignant neoplasm of skin: Secondary | ICD-10-CM | POA: Diagnosis not present

## 2021-11-29 DIAGNOSIS — N1832 Chronic kidney disease, stage 3b: Secondary | ICD-10-CM | POA: Diagnosis not present

## 2021-11-29 DIAGNOSIS — Z87891 Personal history of nicotine dependence: Secondary | ICD-10-CM | POA: Diagnosis not present

## 2021-11-29 DIAGNOSIS — J9612 Chronic respiratory failure with hypercapnia: Secondary | ICD-10-CM | POA: Diagnosis not present

## 2021-12-03 DIAGNOSIS — Z85828 Personal history of other malignant neoplasm of skin: Secondary | ICD-10-CM | POA: Diagnosis not present

## 2021-12-03 DIAGNOSIS — I5032 Chronic diastolic (congestive) heart failure: Secondary | ICD-10-CM | POA: Diagnosis not present

## 2021-12-03 DIAGNOSIS — G912 (Idiopathic) normal pressure hydrocephalus: Secondary | ICD-10-CM | POA: Diagnosis not present

## 2021-12-03 DIAGNOSIS — I2510 Atherosclerotic heart disease of native coronary artery without angina pectoris: Secondary | ICD-10-CM | POA: Diagnosis not present

## 2021-12-03 DIAGNOSIS — Z9181 History of falling: Secondary | ICD-10-CM | POA: Diagnosis not present

## 2021-12-03 DIAGNOSIS — M21372 Foot drop, left foot: Secondary | ICD-10-CM | POA: Diagnosis not present

## 2021-12-03 DIAGNOSIS — J9612 Chronic respiratory failure with hypercapnia: Secondary | ICD-10-CM | POA: Diagnosis not present

## 2021-12-03 DIAGNOSIS — Z7982 Long term (current) use of aspirin: Secondary | ICD-10-CM | POA: Diagnosis not present

## 2021-12-03 DIAGNOSIS — S8011XA Contusion of right lower leg, initial encounter: Secondary | ICD-10-CM | POA: Diagnosis not present

## 2021-12-03 DIAGNOSIS — Z87311 Personal history of (healed) other pathological fracture: Secondary | ICD-10-CM | POA: Diagnosis not present

## 2021-12-03 DIAGNOSIS — R600 Localized edema: Secondary | ICD-10-CM | POA: Diagnosis not present

## 2021-12-03 DIAGNOSIS — J9611 Chronic respiratory failure with hypoxia: Secondary | ICD-10-CM | POA: Diagnosis not present

## 2021-12-03 DIAGNOSIS — Z7983 Long term (current) use of bisphosphonates: Secondary | ICD-10-CM | POA: Diagnosis not present

## 2021-12-03 DIAGNOSIS — I110 Hypertensive heart disease with heart failure: Secondary | ICD-10-CM | POA: Diagnosis not present

## 2021-12-03 DIAGNOSIS — Z87891 Personal history of nicotine dependence: Secondary | ICD-10-CM | POA: Diagnosis not present

## 2021-12-03 DIAGNOSIS — J432 Centrilobular emphysema: Secondary | ICD-10-CM | POA: Diagnosis not present

## 2021-12-03 DIAGNOSIS — S8991XA Unspecified injury of right lower leg, initial encounter: Secondary | ICD-10-CM | POA: Diagnosis not present

## 2021-12-03 DIAGNOSIS — Z8673 Personal history of transient ischemic attack (TIA), and cerebral infarction without residual deficits: Secondary | ICD-10-CM | POA: Diagnosis not present

## 2021-12-03 DIAGNOSIS — I700 Atherosclerosis of aorta: Secondary | ICD-10-CM | POA: Diagnosis not present

## 2021-12-12 DIAGNOSIS — J9612 Chronic respiratory failure with hypercapnia: Secondary | ICD-10-CM | POA: Diagnosis not present

## 2021-12-12 DIAGNOSIS — I2510 Atherosclerotic heart disease of native coronary artery without angina pectoris: Secondary | ICD-10-CM | POA: Diagnosis not present

## 2021-12-12 DIAGNOSIS — M21372 Foot drop, left foot: Secondary | ICD-10-CM | POA: Diagnosis not present

## 2021-12-12 DIAGNOSIS — J9611 Chronic respiratory failure with hypoxia: Secondary | ICD-10-CM | POA: Diagnosis not present

## 2021-12-12 DIAGNOSIS — I700 Atherosclerosis of aorta: Secondary | ICD-10-CM | POA: Diagnosis not present

## 2021-12-12 DIAGNOSIS — Z7983 Long term (current) use of bisphosphonates: Secondary | ICD-10-CM | POA: Diagnosis not present

## 2021-12-12 DIAGNOSIS — J432 Centrilobular emphysema: Secondary | ICD-10-CM | POA: Diagnosis not present

## 2021-12-12 DIAGNOSIS — Z8673 Personal history of transient ischemic attack (TIA), and cerebral infarction without residual deficits: Secondary | ICD-10-CM | POA: Diagnosis not present

## 2021-12-12 DIAGNOSIS — I110 Hypertensive heart disease with heart failure: Secondary | ICD-10-CM | POA: Diagnosis not present

## 2021-12-12 DIAGNOSIS — Z85828 Personal history of other malignant neoplasm of skin: Secondary | ICD-10-CM | POA: Diagnosis not present

## 2021-12-12 DIAGNOSIS — I5032 Chronic diastolic (congestive) heart failure: Secondary | ICD-10-CM | POA: Diagnosis not present

## 2021-12-12 DIAGNOSIS — Z87891 Personal history of nicotine dependence: Secondary | ICD-10-CM | POA: Diagnosis not present

## 2021-12-12 DIAGNOSIS — Z87311 Personal history of (healed) other pathological fracture: Secondary | ICD-10-CM | POA: Diagnosis not present

## 2021-12-12 DIAGNOSIS — Z9181 History of falling: Secondary | ICD-10-CM | POA: Diagnosis not present

## 2021-12-12 DIAGNOSIS — G912 (Idiopathic) normal pressure hydrocephalus: Secondary | ICD-10-CM | POA: Diagnosis not present

## 2021-12-12 DIAGNOSIS — Z7982 Long term (current) use of aspirin: Secondary | ICD-10-CM | POA: Diagnosis not present

## 2021-12-16 DIAGNOSIS — J9611 Chronic respiratory failure with hypoxia: Secondary | ICD-10-CM | POA: Diagnosis not present

## 2021-12-16 DIAGNOSIS — I5032 Chronic diastolic (congestive) heart failure: Secondary | ICD-10-CM | POA: Diagnosis not present

## 2021-12-16 DIAGNOSIS — Z7982 Long term (current) use of aspirin: Secondary | ICD-10-CM | POA: Diagnosis not present

## 2021-12-16 DIAGNOSIS — Z9181 History of falling: Secondary | ICD-10-CM | POA: Diagnosis not present

## 2021-12-16 DIAGNOSIS — Z87891 Personal history of nicotine dependence: Secondary | ICD-10-CM | POA: Diagnosis not present

## 2021-12-16 DIAGNOSIS — Z7983 Long term (current) use of bisphosphonates: Secondary | ICD-10-CM | POA: Diagnosis not present

## 2021-12-16 DIAGNOSIS — Z87311 Personal history of (healed) other pathological fracture: Secondary | ICD-10-CM | POA: Diagnosis not present

## 2021-12-16 DIAGNOSIS — Z8673 Personal history of transient ischemic attack (TIA), and cerebral infarction without residual deficits: Secondary | ICD-10-CM | POA: Diagnosis not present

## 2021-12-16 DIAGNOSIS — I2510 Atherosclerotic heart disease of native coronary artery without angina pectoris: Secondary | ICD-10-CM | POA: Diagnosis not present

## 2021-12-16 DIAGNOSIS — I700 Atherosclerosis of aorta: Secondary | ICD-10-CM | POA: Diagnosis not present

## 2021-12-16 DIAGNOSIS — G912 (Idiopathic) normal pressure hydrocephalus: Secondary | ICD-10-CM | POA: Diagnosis not present

## 2021-12-16 DIAGNOSIS — M21372 Foot drop, left foot: Secondary | ICD-10-CM | POA: Diagnosis not present

## 2021-12-16 DIAGNOSIS — J9612 Chronic respiratory failure with hypercapnia: Secondary | ICD-10-CM | POA: Diagnosis not present

## 2021-12-16 DIAGNOSIS — I110 Hypertensive heart disease with heart failure: Secondary | ICD-10-CM | POA: Diagnosis not present

## 2021-12-16 DIAGNOSIS — J432 Centrilobular emphysema: Secondary | ICD-10-CM | POA: Diagnosis not present

## 2021-12-16 DIAGNOSIS — Z85828 Personal history of other malignant neoplasm of skin: Secondary | ICD-10-CM | POA: Diagnosis not present

## 2021-12-18 DIAGNOSIS — M1711 Unilateral primary osteoarthritis, right knee: Secondary | ICD-10-CM | POA: Diagnosis not present

## 2021-12-18 DIAGNOSIS — M25551 Pain in right hip: Secondary | ICD-10-CM | POA: Diagnosis not present

## 2021-12-18 DIAGNOSIS — G8929 Other chronic pain: Secondary | ICD-10-CM | POA: Diagnosis not present

## 2021-12-18 DIAGNOSIS — M25561 Pain in right knee: Secondary | ICD-10-CM | POA: Diagnosis not present

## 2021-12-24 DIAGNOSIS — Z8673 Personal history of transient ischemic attack (TIA), and cerebral infarction without residual deficits: Secondary | ICD-10-CM | POA: Diagnosis not present

## 2021-12-24 DIAGNOSIS — I700 Atherosclerosis of aorta: Secondary | ICD-10-CM | POA: Diagnosis not present

## 2021-12-24 DIAGNOSIS — Z87311 Personal history of (healed) other pathological fracture: Secondary | ICD-10-CM | POA: Diagnosis not present

## 2021-12-24 DIAGNOSIS — Z9181 History of falling: Secondary | ICD-10-CM | POA: Diagnosis not present

## 2021-12-24 DIAGNOSIS — J9612 Chronic respiratory failure with hypercapnia: Secondary | ICD-10-CM | POA: Diagnosis not present

## 2021-12-24 DIAGNOSIS — I2510 Atherosclerotic heart disease of native coronary artery without angina pectoris: Secondary | ICD-10-CM | POA: Diagnosis not present

## 2021-12-24 DIAGNOSIS — J9611 Chronic respiratory failure with hypoxia: Secondary | ICD-10-CM | POA: Diagnosis not present

## 2021-12-24 DIAGNOSIS — Z85828 Personal history of other malignant neoplasm of skin: Secondary | ICD-10-CM | POA: Diagnosis not present

## 2021-12-24 DIAGNOSIS — Z7982 Long term (current) use of aspirin: Secondary | ICD-10-CM | POA: Diagnosis not present

## 2021-12-24 DIAGNOSIS — I5032 Chronic diastolic (congestive) heart failure: Secondary | ICD-10-CM | POA: Diagnosis not present

## 2021-12-24 DIAGNOSIS — Z87891 Personal history of nicotine dependence: Secondary | ICD-10-CM | POA: Diagnosis not present

## 2021-12-24 DIAGNOSIS — I110 Hypertensive heart disease with heart failure: Secondary | ICD-10-CM | POA: Diagnosis not present

## 2021-12-24 DIAGNOSIS — Z7983 Long term (current) use of bisphosphonates: Secondary | ICD-10-CM | POA: Diagnosis not present

## 2021-12-24 DIAGNOSIS — J432 Centrilobular emphysema: Secondary | ICD-10-CM | POA: Diagnosis not present

## 2021-12-24 DIAGNOSIS — G912 (Idiopathic) normal pressure hydrocephalus: Secondary | ICD-10-CM | POA: Diagnosis not present

## 2021-12-24 DIAGNOSIS — M21372 Foot drop, left foot: Secondary | ICD-10-CM | POA: Diagnosis not present

## 2021-12-31 DIAGNOSIS — I700 Atherosclerosis of aorta: Secondary | ICD-10-CM | POA: Diagnosis not present

## 2021-12-31 DIAGNOSIS — J432 Centrilobular emphysema: Secondary | ICD-10-CM | POA: Diagnosis not present

## 2021-12-31 DIAGNOSIS — I2510 Atherosclerotic heart disease of native coronary artery without angina pectoris: Secondary | ICD-10-CM | POA: Diagnosis not present

## 2021-12-31 DIAGNOSIS — I5032 Chronic diastolic (congestive) heart failure: Secondary | ICD-10-CM | POA: Diagnosis not present

## 2021-12-31 DIAGNOSIS — Z87311 Personal history of (healed) other pathological fracture: Secondary | ICD-10-CM | POA: Diagnosis not present

## 2021-12-31 DIAGNOSIS — Z8673 Personal history of transient ischemic attack (TIA), and cerebral infarction without residual deficits: Secondary | ICD-10-CM | POA: Diagnosis not present

## 2021-12-31 DIAGNOSIS — I110 Hypertensive heart disease with heart failure: Secondary | ICD-10-CM | POA: Diagnosis not present

## 2021-12-31 DIAGNOSIS — J9612 Chronic respiratory failure with hypercapnia: Secondary | ICD-10-CM | POA: Diagnosis not present

## 2021-12-31 DIAGNOSIS — Z7983 Long term (current) use of bisphosphonates: Secondary | ICD-10-CM | POA: Diagnosis not present

## 2021-12-31 DIAGNOSIS — G912 (Idiopathic) normal pressure hydrocephalus: Secondary | ICD-10-CM | POA: Diagnosis not present

## 2021-12-31 DIAGNOSIS — Z87891 Personal history of nicotine dependence: Secondary | ICD-10-CM | POA: Diagnosis not present

## 2021-12-31 DIAGNOSIS — Z9181 History of falling: Secondary | ICD-10-CM | POA: Diagnosis not present

## 2021-12-31 DIAGNOSIS — J9611 Chronic respiratory failure with hypoxia: Secondary | ICD-10-CM | POA: Diagnosis not present

## 2021-12-31 DIAGNOSIS — Z85828 Personal history of other malignant neoplasm of skin: Secondary | ICD-10-CM | POA: Diagnosis not present

## 2021-12-31 DIAGNOSIS — M21372 Foot drop, left foot: Secondary | ICD-10-CM | POA: Diagnosis not present

## 2021-12-31 DIAGNOSIS — Z7982 Long term (current) use of aspirin: Secondary | ICD-10-CM | POA: Diagnosis not present

## 2022-01-01 DIAGNOSIS — M1711 Unilateral primary osteoarthritis, right knee: Secondary | ICD-10-CM | POA: Diagnosis not present

## 2022-01-02 DIAGNOSIS — G912 (Idiopathic) normal pressure hydrocephalus: Secondary | ICD-10-CM | POA: Diagnosis not present

## 2022-01-02 DIAGNOSIS — I2510 Atherosclerotic heart disease of native coronary artery without angina pectoris: Secondary | ICD-10-CM | POA: Diagnosis not present

## 2022-01-02 DIAGNOSIS — H3530 Unspecified macular degeneration: Secondary | ICD-10-CM | POA: Diagnosis not present

## 2022-01-02 DIAGNOSIS — N3281 Overactive bladder: Secondary | ICD-10-CM | POA: Diagnosis not present

## 2022-01-02 DIAGNOSIS — N1832 Chronic kidney disease, stage 3b: Secondary | ICD-10-CM | POA: Diagnosis not present

## 2022-01-02 DIAGNOSIS — R260 Ataxic gait: Secondary | ICD-10-CM | POA: Diagnosis not present

## 2022-01-02 DIAGNOSIS — E669 Obesity, unspecified: Secondary | ICD-10-CM | POA: Diagnosis not present

## 2022-01-02 DIAGNOSIS — I700 Atherosclerosis of aorta: Secondary | ICD-10-CM | POA: Diagnosis not present

## 2022-01-02 DIAGNOSIS — E261 Secondary hyperaldosteronism: Secondary | ICD-10-CM | POA: Diagnosis not present

## 2022-01-02 DIAGNOSIS — H259 Unspecified age-related cataract: Secondary | ICD-10-CM | POA: Diagnosis not present

## 2022-01-02 DIAGNOSIS — J449 Chronic obstructive pulmonary disease, unspecified: Secondary | ICD-10-CM | POA: Diagnosis not present

## 2022-01-02 DIAGNOSIS — M810 Age-related osteoporosis without current pathological fracture: Secondary | ICD-10-CM | POA: Diagnosis not present

## 2022-01-06 ENCOUNTER — Other Ambulatory Visit: Payer: Self-pay | Admitting: Internal Medicine

## 2022-01-06 DIAGNOSIS — Z1231 Encounter for screening mammogram for malignant neoplasm of breast: Secondary | ICD-10-CM

## 2022-01-09 DIAGNOSIS — I2510 Atherosclerotic heart disease of native coronary artery without angina pectoris: Secondary | ICD-10-CM | POA: Diagnosis not present

## 2022-01-09 DIAGNOSIS — I110 Hypertensive heart disease with heart failure: Secondary | ICD-10-CM | POA: Diagnosis not present

## 2022-01-09 DIAGNOSIS — Z9181 History of falling: Secondary | ICD-10-CM | POA: Diagnosis not present

## 2022-01-09 DIAGNOSIS — Z85828 Personal history of other malignant neoplasm of skin: Secondary | ICD-10-CM | POA: Diagnosis not present

## 2022-01-09 DIAGNOSIS — J9612 Chronic respiratory failure with hypercapnia: Secondary | ICD-10-CM | POA: Diagnosis not present

## 2022-01-09 DIAGNOSIS — Z87311 Personal history of (healed) other pathological fracture: Secondary | ICD-10-CM | POA: Diagnosis not present

## 2022-01-09 DIAGNOSIS — Z7982 Long term (current) use of aspirin: Secondary | ICD-10-CM | POA: Diagnosis not present

## 2022-01-09 DIAGNOSIS — J9611 Chronic respiratory failure with hypoxia: Secondary | ICD-10-CM | POA: Diagnosis not present

## 2022-01-09 DIAGNOSIS — Z7983 Long term (current) use of bisphosphonates: Secondary | ICD-10-CM | POA: Diagnosis not present

## 2022-01-09 DIAGNOSIS — Z87891 Personal history of nicotine dependence: Secondary | ICD-10-CM | POA: Diagnosis not present

## 2022-01-09 DIAGNOSIS — M21372 Foot drop, left foot: Secondary | ICD-10-CM | POA: Diagnosis not present

## 2022-01-09 DIAGNOSIS — I700 Atherosclerosis of aorta: Secondary | ICD-10-CM | POA: Diagnosis not present

## 2022-01-09 DIAGNOSIS — I5032 Chronic diastolic (congestive) heart failure: Secondary | ICD-10-CM | POA: Diagnosis not present

## 2022-01-09 DIAGNOSIS — J432 Centrilobular emphysema: Secondary | ICD-10-CM | POA: Diagnosis not present

## 2022-01-09 DIAGNOSIS — G912 (Idiopathic) normal pressure hydrocephalus: Secondary | ICD-10-CM | POA: Diagnosis not present

## 2022-01-09 DIAGNOSIS — Z8673 Personal history of transient ischemic attack (TIA), and cerebral infarction without residual deficits: Secondary | ICD-10-CM | POA: Diagnosis not present

## 2022-01-14 ENCOUNTER — Encounter (INDEPENDENT_AMBULATORY_CARE_PROVIDER_SITE_OTHER): Payer: PPO | Admitting: Ophthalmology

## 2022-01-15 DIAGNOSIS — H25011 Cortical age-related cataract, right eye: Secondary | ICD-10-CM | POA: Diagnosis not present

## 2022-01-15 DIAGNOSIS — N1832 Chronic kidney disease, stage 3b: Secondary | ICD-10-CM | POA: Diagnosis not present

## 2022-01-15 DIAGNOSIS — Z885 Allergy status to narcotic agent status: Secondary | ICD-10-CM | POA: Diagnosis not present

## 2022-01-15 DIAGNOSIS — Z7982 Long term (current) use of aspirin: Secondary | ICD-10-CM | POA: Diagnosis not present

## 2022-01-15 DIAGNOSIS — Z87891 Personal history of nicotine dependence: Secondary | ICD-10-CM | POA: Diagnosis not present

## 2022-01-15 DIAGNOSIS — I5033 Acute on chronic diastolic (congestive) heart failure: Secondary | ICD-10-CM | POA: Diagnosis not present

## 2022-01-15 DIAGNOSIS — J449 Chronic obstructive pulmonary disease, unspecified: Secondary | ICD-10-CM | POA: Diagnosis not present

## 2022-01-15 DIAGNOSIS — H2511 Age-related nuclear cataract, right eye: Secondary | ICD-10-CM | POA: Diagnosis not present

## 2022-01-15 DIAGNOSIS — Z7983 Long term (current) use of bisphosphonates: Secondary | ICD-10-CM | POA: Diagnosis not present

## 2022-01-15 DIAGNOSIS — I130 Hypertensive heart and chronic kidney disease with heart failure and stage 1 through stage 4 chronic kidney disease, or unspecified chronic kidney disease: Secondary | ICD-10-CM | POA: Diagnosis not present

## 2022-01-15 DIAGNOSIS — Z79899 Other long term (current) drug therapy: Secondary | ICD-10-CM | POA: Diagnosis not present

## 2022-01-16 DIAGNOSIS — Z9181 History of falling: Secondary | ICD-10-CM | POA: Diagnosis not present

## 2022-01-16 DIAGNOSIS — I2510 Atherosclerotic heart disease of native coronary artery without angina pectoris: Secondary | ICD-10-CM | POA: Diagnosis not present

## 2022-01-16 DIAGNOSIS — Z87891 Personal history of nicotine dependence: Secondary | ICD-10-CM | POA: Diagnosis not present

## 2022-01-16 DIAGNOSIS — I110 Hypertensive heart disease with heart failure: Secondary | ICD-10-CM | POA: Diagnosis not present

## 2022-01-16 DIAGNOSIS — G912 (Idiopathic) normal pressure hydrocephalus: Secondary | ICD-10-CM | POA: Diagnosis not present

## 2022-01-16 DIAGNOSIS — Z87311 Personal history of (healed) other pathological fracture: Secondary | ICD-10-CM | POA: Diagnosis not present

## 2022-01-16 DIAGNOSIS — J432 Centrilobular emphysema: Secondary | ICD-10-CM | POA: Diagnosis not present

## 2022-01-16 DIAGNOSIS — I5032 Chronic diastolic (congestive) heart failure: Secondary | ICD-10-CM | POA: Diagnosis not present

## 2022-01-16 DIAGNOSIS — Z7982 Long term (current) use of aspirin: Secondary | ICD-10-CM | POA: Diagnosis not present

## 2022-01-16 DIAGNOSIS — M21372 Foot drop, left foot: Secondary | ICD-10-CM | POA: Diagnosis not present

## 2022-01-16 DIAGNOSIS — Z8673 Personal history of transient ischemic attack (TIA), and cerebral infarction without residual deficits: Secondary | ICD-10-CM | POA: Diagnosis not present

## 2022-01-16 DIAGNOSIS — Z7983 Long term (current) use of bisphosphonates: Secondary | ICD-10-CM | POA: Diagnosis not present

## 2022-01-16 DIAGNOSIS — H2512 Age-related nuclear cataract, left eye: Secondary | ICD-10-CM | POA: Diagnosis not present

## 2022-01-16 DIAGNOSIS — J9611 Chronic respiratory failure with hypoxia: Secondary | ICD-10-CM | POA: Diagnosis not present

## 2022-01-16 DIAGNOSIS — Z85828 Personal history of other malignant neoplasm of skin: Secondary | ICD-10-CM | POA: Diagnosis not present

## 2022-01-16 DIAGNOSIS — J9612 Chronic respiratory failure with hypercapnia: Secondary | ICD-10-CM | POA: Diagnosis not present

## 2022-01-16 DIAGNOSIS — I700 Atherosclerosis of aorta: Secondary | ICD-10-CM | POA: Diagnosis not present

## 2022-01-21 ENCOUNTER — Encounter (INDEPENDENT_AMBULATORY_CARE_PROVIDER_SITE_OTHER): Payer: PPO | Admitting: Ophthalmology

## 2022-01-21 DIAGNOSIS — H35722 Serous detachment of retinal pigment epithelium, left eye: Secondary | ICD-10-CM | POA: Diagnosis not present

## 2022-01-21 DIAGNOSIS — H2512 Age-related nuclear cataract, left eye: Secondary | ICD-10-CM | POA: Diagnosis not present

## 2022-01-21 DIAGNOSIS — H353221 Exudative age-related macular degeneration, left eye, with active choroidal neovascularization: Secondary | ICD-10-CM | POA: Diagnosis not present

## 2022-01-21 DIAGNOSIS — H353132 Nonexudative age-related macular degeneration, bilateral, intermediate dry stage: Secondary | ICD-10-CM | POA: Diagnosis not present

## 2022-01-21 DIAGNOSIS — M2391 Unspecified internal derangement of right knee: Secondary | ICD-10-CM | POA: Diagnosis not present

## 2022-01-22 ENCOUNTER — Other Ambulatory Visit: Payer: Self-pay | Admitting: Physician Assistant

## 2022-01-22 DIAGNOSIS — M2390 Unspecified internal derangement of unspecified knee: Secondary | ICD-10-CM

## 2022-01-28 DIAGNOSIS — Z85828 Personal history of other malignant neoplasm of skin: Secondary | ICD-10-CM | POA: Diagnosis not present

## 2022-01-28 DIAGNOSIS — I700 Atherosclerosis of aorta: Secondary | ICD-10-CM | POA: Diagnosis not present

## 2022-01-28 DIAGNOSIS — M21372 Foot drop, left foot: Secondary | ICD-10-CM | POA: Diagnosis not present

## 2022-01-28 DIAGNOSIS — Z87311 Personal history of (healed) other pathological fracture: Secondary | ICD-10-CM | POA: Diagnosis not present

## 2022-01-28 DIAGNOSIS — J432 Centrilobular emphysema: Secondary | ICD-10-CM | POA: Diagnosis not present

## 2022-01-28 DIAGNOSIS — G912 (Idiopathic) normal pressure hydrocephalus: Secondary | ICD-10-CM | POA: Diagnosis not present

## 2022-01-28 DIAGNOSIS — J9612 Chronic respiratory failure with hypercapnia: Secondary | ICD-10-CM | POA: Diagnosis not present

## 2022-01-28 DIAGNOSIS — Z9181 History of falling: Secondary | ICD-10-CM | POA: Diagnosis not present

## 2022-01-28 DIAGNOSIS — Z7982 Long term (current) use of aspirin: Secondary | ICD-10-CM | POA: Diagnosis not present

## 2022-01-28 DIAGNOSIS — J9611 Chronic respiratory failure with hypoxia: Secondary | ICD-10-CM | POA: Diagnosis not present

## 2022-01-28 DIAGNOSIS — J449 Chronic obstructive pulmonary disease, unspecified: Secondary | ICD-10-CM | POA: Diagnosis not present

## 2022-01-28 DIAGNOSIS — Z7983 Long term (current) use of bisphosphonates: Secondary | ICD-10-CM | POA: Diagnosis not present

## 2022-01-28 DIAGNOSIS — Z8673 Personal history of transient ischemic attack (TIA), and cerebral infarction without residual deficits: Secondary | ICD-10-CM | POA: Diagnosis not present

## 2022-01-28 DIAGNOSIS — Z87891 Personal history of nicotine dependence: Secondary | ICD-10-CM | POA: Diagnosis not present

## 2022-01-28 DIAGNOSIS — I110 Hypertensive heart disease with heart failure: Secondary | ICD-10-CM | POA: Diagnosis not present

## 2022-01-28 DIAGNOSIS — I2510 Atherosclerotic heart disease of native coronary artery without angina pectoris: Secondary | ICD-10-CM | POA: Diagnosis not present

## 2022-01-28 DIAGNOSIS — I5032 Chronic diastolic (congestive) heart failure: Secondary | ICD-10-CM | POA: Diagnosis not present

## 2022-01-29 DIAGNOSIS — I10 Essential (primary) hypertension: Secondary | ICD-10-CM | POA: Diagnosis not present

## 2022-01-29 DIAGNOSIS — H25012 Cortical age-related cataract, left eye: Secondary | ICD-10-CM | POA: Diagnosis not present

## 2022-01-29 DIAGNOSIS — Z791 Long term (current) use of non-steroidal anti-inflammatories (NSAID): Secondary | ICD-10-CM | POA: Diagnosis not present

## 2022-01-29 DIAGNOSIS — Z7982 Long term (current) use of aspirin: Secondary | ICD-10-CM | POA: Diagnosis not present

## 2022-01-29 DIAGNOSIS — Z7983 Long term (current) use of bisphosphonates: Secondary | ICD-10-CM | POA: Diagnosis not present

## 2022-01-29 DIAGNOSIS — J449 Chronic obstructive pulmonary disease, unspecified: Secondary | ICD-10-CM | POA: Diagnosis not present

## 2022-01-29 DIAGNOSIS — Z79899 Other long term (current) drug therapy: Secondary | ICD-10-CM | POA: Diagnosis not present

## 2022-01-29 DIAGNOSIS — H2511 Age-related nuclear cataract, right eye: Secondary | ICD-10-CM | POA: Diagnosis not present

## 2022-01-29 DIAGNOSIS — Z87891 Personal history of nicotine dependence: Secondary | ICD-10-CM | POA: Diagnosis not present

## 2022-01-29 DIAGNOSIS — H2512 Age-related nuclear cataract, left eye: Secondary | ICD-10-CM | POA: Diagnosis not present

## 2022-02-03 ENCOUNTER — Ambulatory Visit
Admission: RE | Admit: 2022-02-03 | Discharge: 2022-02-03 | Disposition: A | Discharge status: Home / Self Care | Payer: PPO | Source: Ambulatory Visit | Attending: Physician Assistant | Admitting: Physician Assistant

## 2022-02-03 DIAGNOSIS — M2390 Unspecified internal derangement of unspecified knee: Secondary | ICD-10-CM

## 2022-02-03 DIAGNOSIS — M25561 Pain in right knee: Secondary | ICD-10-CM | POA: Diagnosis not present

## 2022-02-05 DIAGNOSIS — M2391 Unspecified internal derangement of right knee: Secondary | ICD-10-CM | POA: Diagnosis not present

## 2022-02-07 DIAGNOSIS — J432 Centrilobular emphysema: Secondary | ICD-10-CM | POA: Diagnosis not present

## 2022-02-07 DIAGNOSIS — I110 Hypertensive heart disease with heart failure: Secondary | ICD-10-CM | POA: Diagnosis not present

## 2022-02-07 DIAGNOSIS — I2510 Atherosclerotic heart disease of native coronary artery without angina pectoris: Secondary | ICD-10-CM | POA: Diagnosis not present

## 2022-02-07 DIAGNOSIS — I5032 Chronic diastolic (congestive) heart failure: Secondary | ICD-10-CM | POA: Diagnosis not present

## 2022-02-07 DIAGNOSIS — J449 Chronic obstructive pulmonary disease, unspecified: Secondary | ICD-10-CM | POA: Diagnosis not present

## 2022-02-07 DIAGNOSIS — J9612 Chronic respiratory failure with hypercapnia: Secondary | ICD-10-CM | POA: Diagnosis not present

## 2022-02-07 DIAGNOSIS — J9611 Chronic respiratory failure with hypoxia: Secondary | ICD-10-CM | POA: Diagnosis not present

## 2022-02-12 DIAGNOSIS — Z7409 Other reduced mobility: Secondary | ICD-10-CM | POA: Diagnosis not present

## 2022-02-12 DIAGNOSIS — M1711 Unilateral primary osteoarthritis, right knee: Secondary | ICD-10-CM | POA: Diagnosis not present

## 2022-02-13 ENCOUNTER — Emergency Department: Payer: PPO

## 2022-02-13 ENCOUNTER — Other Ambulatory Visit: Payer: Self-pay

## 2022-02-13 ENCOUNTER — Encounter: Payer: Self-pay | Admitting: *Deleted

## 2022-02-13 DIAGNOSIS — Z7982 Long term (current) use of aspirin: Secondary | ICD-10-CM | POA: Diagnosis not present

## 2022-02-13 DIAGNOSIS — Z7983 Long term (current) use of bisphosphonates: Secondary | ICD-10-CM

## 2022-02-13 DIAGNOSIS — I700 Atherosclerosis of aorta: Secondary | ICD-10-CM | POA: Diagnosis present

## 2022-02-13 DIAGNOSIS — N390 Urinary tract infection, site not specified: Secondary | ICD-10-CM | POA: Diagnosis not present

## 2022-02-13 DIAGNOSIS — I10 Essential (primary) hypertension: Secondary | ICD-10-CM | POA: Diagnosis not present

## 2022-02-13 DIAGNOSIS — Z885 Allergy status to narcotic agent status: Secondary | ICD-10-CM

## 2022-02-13 DIAGNOSIS — Z66 Do not resuscitate: Secondary | ICD-10-CM | POA: Diagnosis not present

## 2022-02-13 DIAGNOSIS — N3281 Overactive bladder: Secondary | ICD-10-CM | POA: Diagnosis not present

## 2022-02-13 DIAGNOSIS — J441 Chronic obstructive pulmonary disease with (acute) exacerbation: Principal | ICD-10-CM | POA: Diagnosis present

## 2022-02-13 DIAGNOSIS — J9601 Acute respiratory failure with hypoxia: Secondary | ICD-10-CM | POA: Diagnosis present

## 2022-02-13 DIAGNOSIS — Z87891 Personal history of nicotine dependence: Secondary | ICD-10-CM

## 2022-02-13 DIAGNOSIS — M8588 Other specified disorders of bone density and structure, other site: Secondary | ICD-10-CM | POA: Diagnosis not present

## 2022-02-13 DIAGNOSIS — Z79890 Hormone replacement therapy (postmenopausal): Secondary | ICD-10-CM

## 2022-02-13 DIAGNOSIS — R0609 Other forms of dyspnea: Secondary | ICD-10-CM | POA: Diagnosis not present

## 2022-02-13 DIAGNOSIS — Z79899 Other long term (current) drug therapy: Secondary | ICD-10-CM

## 2022-02-13 DIAGNOSIS — R0602 Shortness of breath: Secondary | ICD-10-CM | POA: Diagnosis not present

## 2022-02-13 DIAGNOSIS — Z87440 Personal history of urinary (tract) infections: Secondary | ICD-10-CM

## 2022-02-13 DIAGNOSIS — J439 Emphysema, unspecified: Secondary | ICD-10-CM | POA: Diagnosis present

## 2022-02-13 DIAGNOSIS — J449 Chronic obstructive pulmonary disease, unspecified: Secondary | ICD-10-CM | POA: Diagnosis not present

## 2022-02-13 DIAGNOSIS — Z1152 Encounter for screening for COVID-19: Secondary | ICD-10-CM | POA: Diagnosis not present

## 2022-02-13 LAB — BASIC METABOLIC PANEL
Anion gap: 8 (ref 5–15)
BUN: 24 mg/dL — ABNORMAL HIGH (ref 8–23)
CO2: 28 mmol/L (ref 22–32)
Calcium: 9.3 mg/dL (ref 8.9–10.3)
Chloride: 100 mmol/L (ref 98–111)
Creatinine, Ser: 0.95 mg/dL (ref 0.44–1.00)
GFR, Estimated: >60 mL/min (ref >60)
Glucose, Bld: 106 mg/dL — ABNORMAL HIGH (ref 70–99)
Potassium: 5 mmol/L (ref 3.5–5.1)
Sodium: 136 mmol/L (ref 135–145)

## 2022-02-13 LAB — CBC
HCT: 40 % (ref 36.0–46.0)
Hemoglobin: 12.4 g/dL (ref 12.0–15.0)
MCH: 30.9 pg (ref 26.0–34.0)
MCHC: 31 g/dL (ref 30.0–36.0)
MCV: 99.8 fL (ref 80.0–100.0)
Platelets: 214 10*3/uL (ref 150–400)
RBC: 4.01 MIL/uL (ref 3.87–5.11)
RDW: 14.1 % (ref 11.5–15.5)
WBC: 9.2 10*3/uL (ref 4.0–10.5)
nRBC: 0 % (ref 0.0–0.2)

## 2022-02-13 LAB — TROPONIN I (HIGH SENSITIVITY): Troponin I (High Sensitivity): 6 ng/L (ref <18)

## 2022-02-13 NOTE — ED Triage Notes (Signed)
Husband reports low oxygen sats at home tonight.  Pt reports sob.  No chest pain.  No n/v pt alert  speech clear.

## 2022-02-14 ENCOUNTER — Inpatient Hospital Stay
Admission: EM | Admit: 2022-02-14 | Discharge: 2022-02-15 | DRG: 190 | Disposition: A | Discharge status: Home / Self Care | Payer: PPO | Attending: Internal Medicine | Admitting: Internal Medicine

## 2022-02-14 ENCOUNTER — Emergency Department: Payer: PPO

## 2022-02-14 DIAGNOSIS — I10 Essential (primary) hypertension: Secondary | ICD-10-CM | POA: Diagnosis present

## 2022-02-14 DIAGNOSIS — Z87891 Personal history of nicotine dependence: Secondary | ICD-10-CM | POA: Diagnosis not present

## 2022-02-14 DIAGNOSIS — N3281 Overactive bladder: Secondary | ICD-10-CM | POA: Diagnosis present

## 2022-02-14 DIAGNOSIS — Z79890 Hormone replacement therapy (postmenopausal): Secondary | ICD-10-CM | POA: Diagnosis not present

## 2022-02-14 DIAGNOSIS — Z1152 Encounter for screening for COVID-19: Secondary | ICD-10-CM | POA: Diagnosis not present

## 2022-02-14 DIAGNOSIS — Z7983 Long term (current) use of bisphosphonates: Secondary | ICD-10-CM | POA: Diagnosis not present

## 2022-02-14 DIAGNOSIS — R0609 Other forms of dyspnea: Principal | ICD-10-CM

## 2022-02-14 DIAGNOSIS — Z885 Allergy status to narcotic agent status: Secondary | ICD-10-CM | POA: Diagnosis not present

## 2022-02-14 DIAGNOSIS — J439 Emphysema, unspecified: Secondary | ICD-10-CM | POA: Diagnosis present

## 2022-02-14 DIAGNOSIS — J441 Chronic obstructive pulmonary disease with (acute) exacerbation: Secondary | ICD-10-CM | POA: Diagnosis present

## 2022-02-14 DIAGNOSIS — M8588 Other specified disorders of bone density and structure, other site: Secondary | ICD-10-CM | POA: Diagnosis present

## 2022-02-14 DIAGNOSIS — Z7982 Long term (current) use of aspirin: Secondary | ICD-10-CM | POA: Diagnosis not present

## 2022-02-14 DIAGNOSIS — Z87440 Personal history of urinary (tract) infections: Secondary | ICD-10-CM | POA: Diagnosis not present

## 2022-02-14 DIAGNOSIS — Z66 Do not resuscitate: Secondary | ICD-10-CM | POA: Diagnosis present

## 2022-02-14 DIAGNOSIS — J449 Chronic obstructive pulmonary disease, unspecified: Secondary | ICD-10-CM | POA: Diagnosis present

## 2022-02-14 DIAGNOSIS — J9601 Acute respiratory failure with hypoxia: Secondary | ICD-10-CM

## 2022-02-14 DIAGNOSIS — I700 Atherosclerosis of aorta: Secondary | ICD-10-CM | POA: Diagnosis present

## 2022-02-14 DIAGNOSIS — Z79899 Other long term (current) drug therapy: Secondary | ICD-10-CM | POA: Diagnosis not present

## 2022-02-14 LAB — RESP PANEL BY RT-PCR (RSV, FLU A&B, COVID)  RVPGX2
Influenza A by PCR: NEGATIVE
Influenza B by PCR: NEGATIVE
Resp Syncytial Virus by PCR: NEGATIVE
SARS Coronavirus 2 by RT PCR: NEGATIVE

## 2022-02-14 LAB — BASIC METABOLIC PANEL
Anion gap: 4 — ABNORMAL LOW (ref 5–15)
BUN: 20 mg/dL (ref 8–23)
CO2: 34 mmol/L — ABNORMAL HIGH (ref 22–32)
Calcium: 9.4 mg/dL (ref 8.9–10.3)
Chloride: 104 mmol/L (ref 98–111)
Creatinine, Ser: 0.76 mg/dL (ref 0.44–1.00)
GFR, Estimated: >60 mL/min (ref >60)
Glucose, Bld: 139 mg/dL — ABNORMAL HIGH (ref 70–99)
Potassium: 4.4 mmol/L (ref 3.5–5.1)
Sodium: 142 mmol/L (ref 135–145)

## 2022-02-14 LAB — CBC
HCT: 39.4 % (ref 36.0–46.0)
Hemoglobin: 12.4 g/dL (ref 12.0–15.0)
MCH: 31.2 pg (ref 26.0–34.0)
MCHC: 31.5 g/dL (ref 30.0–36.0)
MCV: 99.2 fL (ref 80.0–100.0)
Platelets: 204 10*3/uL (ref 150–400)
RBC: 3.97 MIL/uL (ref 3.87–5.11)
RDW: 14.2 % (ref 11.5–15.5)
WBC: 8 10*3/uL (ref 4.0–10.5)
nRBC: 0 % (ref 0.0–0.2)

## 2022-02-14 LAB — TROPONIN I (HIGH SENSITIVITY): Troponin I (High Sensitivity): 7 ng/L (ref <18)

## 2022-02-14 LAB — D-DIMER, QUANTITATIVE: D-Dimer, Quant: 0.81 ug/mL-FEU — ABNORMAL HIGH (ref 0.00–0.50)

## 2022-02-14 LAB — PROCALCITONIN: Procalcitonin: <0.1 ng/mL

## 2022-02-14 MED ORDER — CRANBERRY 300 MG PO TABS: 300.0000 mg | ORAL_TABLET | Freq: Two times a day (BID) | ORAL | Status: DC

## 2022-02-14 MED ORDER — ALENDRONATE SODIUM 70 MG PO TABS: 70.0000 mg | ORAL_TABLET | ORAL | Status: DC

## 2022-02-14 MED ORDER — HYDROCOD POLI-CHLORPHE POLI ER 10-8 MG/5ML PO SUER: 5.0000 mL | Freq: Two times a day (BID) | ORAL | Status: DC | PRN

## 2022-02-14 MED ORDER — SODIUM CHLORIDE 0.9 % IV SOLN: INTRAVENOUS | Status: DC

## 2022-02-14 MED ORDER — SPIRONOLACTONE 25 MG PO TABS
25.0000 mg | ORAL_TABLET | Freq: Every day | ORAL | Status: DC
  Administered 2022-02-15: 25 mg via ORAL
  Filled 2022-02-14 (×2): qty 1

## 2022-02-14 MED ORDER — ONDANSETRON HCL 4 MG/2ML IJ SOLN: 4.0000 mg | Freq: Four times a day (QID) | INTRAMUSCULAR | Status: DC | PRN

## 2022-02-14 MED ORDER — IPRATROPIUM-ALBUTEROL 0.5-2.5 (3) MG/3ML IN SOLN
3.0000 mL | RESPIRATORY_TRACT | Status: AC
  Administered 2022-02-14 (×2): 3 mL via RESPIRATORY_TRACT
  Filled 2022-02-14 (×2): qty 3

## 2022-02-14 MED ORDER — SODIUM CHLORIDE 0.9 % IV SOLN
1.0000 g | INTRAVENOUS | Status: DC
  Administered 2022-02-14: 1 g via INTRAVENOUS
  Filled 2022-02-14: qty 10

## 2022-02-14 MED ORDER — METHYLPREDNISOLONE SODIUM SUCC 125 MG IJ SOLR
125.0000 mg | Freq: Once | INTRAMUSCULAR | Status: AC
  Administered 2022-02-14: 125 mg via INTRAVENOUS
  Filled 2022-02-14: qty 2

## 2022-02-14 MED ORDER — AZITHROMYCIN 500 MG PO TABS
250.0000 mg | ORAL_TABLET | Freq: Every day | ORAL | Status: DC
  Administered 2022-02-15: 250 mg via ORAL
  Filled 2022-02-14 (×2): qty 1

## 2022-02-14 MED ORDER — GUAIFENESIN ER 600 MG PO TB12
600.0000 mg | ORAL_TABLET | Freq: Two times a day (BID) | ORAL | Status: DC
  Administered 2022-02-14 – 2022-02-15 (×3): 600 mg via ORAL
  Filled 2022-02-14 (×3): qty 1

## 2022-02-14 MED ORDER — ACETAMINOPHEN 325 MG PO TABS
650.0000 mg | ORAL_TABLET | Freq: Four times a day (QID) | ORAL | Status: DC | PRN
  Administered 2022-02-15: 650 mg via ORAL
  Filled 2022-02-14: qty 2

## 2022-02-14 MED ORDER — PREDNISOLONE ACETATE 1 % OP SUSP
1.0000 [drp] | Freq: Four times a day (QID) | OPHTHALMIC | Status: DC
  Administered 2022-02-14 – 2022-02-15 (×3): 1 [drp] via OPHTHALMIC

## 2022-02-14 MED ORDER — ACETAMINOPHEN 650 MG RE SUPP: 650.0000 mg | Freq: Four times a day (QID) | RECTAL | Status: DC | PRN

## 2022-02-14 MED ORDER — OYSTER SHELL CALCIUM/D3 500-5 MG-MCG PO TABS
1.0000 | ORAL_TABLET | Freq: Every day | ORAL | Status: DC
  Administered 2022-02-15: 1 via ORAL
  Filled 2022-02-14 (×2): qty 1

## 2022-02-14 MED ORDER — TORSEMIDE 20 MG PO TABS
20.0000 mg | ORAL_TABLET | Freq: Once | ORAL | Status: AC
  Administered 2022-02-14: 20 mg via ORAL
  Filled 2022-02-14: qty 1

## 2022-02-14 MED ORDER — MAGNESIUM HYDROXIDE 400 MG/5ML PO SUSP: 30.0000 mL | Freq: Every day | ORAL | Status: DC | PRN

## 2022-02-14 MED ORDER — PREDNISONE 50 MG PO TABS
50.0000 mg | ORAL_TABLET | Freq: Every day | ORAL | Status: DC
  Administered 2022-02-15: 50 mg via ORAL
  Filled 2022-02-14: qty 1

## 2022-02-14 MED ORDER — PREDNISOLONE ACETATE 1 % OP SUSP
1.0000 [drp] | Freq: Four times a day (QID) | OPHTHALMIC | Status: DC
  Administered 2022-02-14 (×3): 1 [drp] via OPHTHALMIC
  Filled 2022-02-14: qty 5

## 2022-02-14 MED ORDER — ONDANSETRON HCL 4 MG PO TABS: 4.0000 mg | ORAL_TABLET | Freq: Four times a day (QID) | ORAL | Status: DC | PRN

## 2022-02-14 MED ORDER — AZITHROMYCIN 500 MG PO TABS
500.0000 mg | ORAL_TABLET | Freq: Every day | ORAL | Status: AC
  Administered 2022-02-14: 500 mg via ORAL
  Filled 2022-02-14: qty 1

## 2022-02-14 MED ORDER — ESTRADIOL 0.1 MG/GM VA CREA: 1.0000 | TOPICAL_CREAM | VAGINAL | Status: DC

## 2022-02-14 MED ORDER — TORSEMIDE 20 MG PO TABS
20.0000 mg | ORAL_TABLET | Freq: Every day | ORAL | Status: DC
  Administered 2022-02-14 – 2022-02-15 (×2): 20 mg via ORAL
  Filled 2022-02-14 (×2): qty 1

## 2022-02-14 MED ORDER — MIRABEGRON ER 50 MG PO TB24
50.0000 mg | ORAL_TABLET | Freq: Every day | ORAL | Status: DC
  Administered 2022-02-14 – 2022-02-15 (×2): 50 mg via ORAL
  Filled 2022-02-14 (×2): qty 1

## 2022-02-14 MED ORDER — KETOROLAC TROMETHAMINE 0.5 % OP SOLN
1.0000 [drp] | Freq: Four times a day (QID) | OPHTHALMIC | Status: DC
  Administered 2022-02-14 – 2022-02-15 (×6): 1 [drp] via OPHTHALMIC
  Filled 2022-02-14: qty 3

## 2022-02-14 MED ORDER — ENOXAPARIN SODIUM 40 MG/0.4ML IJ SOSY
40.0000 mg | PREFILLED_SYRINGE | INTRAMUSCULAR | Status: DC
  Administered 2022-02-14 – 2022-02-15 (×2): 40 mg via SUBCUTANEOUS
  Filled 2022-02-14 (×2): qty 0.4

## 2022-02-14 MED ORDER — ASPIRIN 81 MG PO CHEW
81.0000 mg | CHEWABLE_TABLET | Freq: Every day | ORAL | Status: DC
  Administered 2022-02-14 – 2022-02-15 (×2): 81 mg via ORAL
  Filled 2022-02-14 (×2): qty 1

## 2022-02-14 MED ORDER — IOHEXOL 350 MG/ML SOLN
75.0000 mL | Freq: Once | INTRAVENOUS | Status: AC | PRN
  Administered 2022-02-14: 75 mL via INTRAVENOUS

## 2022-02-14 MED ORDER — TRAZODONE HCL 50 MG PO TABS: 25.0000 mg | ORAL_TABLET | Freq: Every evening | ORAL | Status: DC | PRN

## 2022-02-14 NOTE — H&P (Signed)
[place]   PATIENT NAME: Abigail Hill    MR#:  [id number]  DATE OF BIRTH:  10--1952  DATE OF ADMISSION:  02/14/2022  PRIMARY CARE PHYSICIAN: Baxter Hire, MD   Patient is coming from: Home  REQUESTING/REFERRING PHYSICIAN: Ward, Delice Bison, DO  CHIEF COMPLAINT:   Chief Complaint  Patient presents with   Shortness of Breath    HISTORY OF PRESENT ILLNESS:  Abigail Hill is a 71 y.o. Caucasian female with medical history significant for hypertension and recurrent UTI, who presented to the emergency room with a Kalisetti of worsening dyspnea with associated dry cough and hypoxia with pulse oximetry of 82% on room air at home when checked by her husband.  She denies chest pain or palpitations.  No orthopnea or paroxysmal nocturnal dyspnea or worsening lower extremity edema.  She denies any wheezing with her cough.  No dysuria, oliguria or hematuria or flank pain.  No bleeding diathesis.  ED Course: When she came to the ER, respiratory rate was 22 and pulse oximetry was 86% on room air and 97% on 2 L O2 and later on heart rate was up to 109 with respiratory rate up to 25.Labs revealed a CO2 of 34 and glucose of 139 with unremarkable CBC.  Influenza antigens and COVID-19 PCR as well as RSV PCR came back negative.  D-dimer was 0.81. EKG as reviewed by me : EKG showed normal sinus rhythm with a rate of 89 with low voltage QRS with poor R wave progression. Imaging: Two-view chest x-ray showed emphysema and aortic atherosclerosis, osteopenia with thoracic kyphodextroscoliosis with no acute chest disease.  Chest CTA revealed aortic atherosclerosis and emphysema with no PE or aortic dissection  The patient was given 125 mg of IV Solu-Medrol as well as DuoNeb.  She will be admitted to a medical telemetry bed for further evaluation and management. PAST MEDICAL HISTORY:   Past Medical History:  Diagnosis Date   Abnormal gait 12/18/2015   BP (high blood pressure) 08/24/2015   Cancer ([place])     skin   Constriction of ureter (postoperative) 07/28/2015   Hydronephrosis 07/14/2015   Hypertension    Left foot drop 12/18/2015   Recurrent UTI    UPJ (ureteropelvic junction) obstruction 07/28/2015   UTI (lower urinary tract infection) 07/14/2015    PAST SURGICAL HISTORY:   Past Surgical History:  Procedure Laterality Date   broken foot  2012   COLONOSCOPY WITH PROPOFOL N/A 06/30/2017   Procedure: COLONOSCOPY WITH PROPOFOL;  Surgeon: Lollie Sails, MD;  Location: [facility] ENDOSCOPY;  Service: Endoscopy;  Laterality: N/A;   kidney repair  1985   repaired torn blood vessel in kidney    SOCIAL HISTORY:   Social History   Tobacco Use   Smoking status: Former   Smokeless tobacco: Never   Tobacco comments:    quit 10 years  Substance Use Topics   Alcohol use: Never    Alcohol/week: 0.0 standard drinks of alcohol    FAMILY HISTORY:   Family History  Problem Relation Age of Onset   Kidney disease Neg Hx    Bladder Cancer Neg Hx     DRUG ALLERGIES:   Allergies  Allergen Reactions   Oxycodone     REVIEW OF SYSTEMS:   ROS As per history of present illness. All pertinent systems were reviewed above. Constitutional, HEENT, cardiovascular, respiratory, GI, GU, musculoskeletal, neuro, psychiatric, endocrine, integumentary and hematologic systems were reviewed and are otherwise negative/unremarkable except for  positive findings mentioned above in the HPI.   MEDICATIONS AT HOME:   Prior to Admission medications   Medication Sig Start Date End Date Taking? Authorizing Provider  alendronate (FOSAMAX) 70 MG tablet Take 70 mg by mouth every Monday.    [provider]  aspirin 81 MG chewable tablet Chew 81 mg by mouth daily.    [provider]  Calcium Carb-Cholecalciferol (OYSTER SHELL CALCIUM) 500-400 MG-UNIT TABS Take 1 tablet by mouth daily.    [provider]  COMBIVENT RESPIMAT 20-100 MCG/ACT AERS respimat Inhale 1 puff into the lungs every  6 (six) hours as needed.    [provider]  Cranberry 300 MG tablet Take 300 mg by mouth 2 (two) times daily.    [provider]  estradiol (ESTRACE) 0.1 MG/GM vaginal cream Place 1 Applicatorful vaginally at bedtime. Estrogen Cream Instruction Discard applicator Apply pea sized amount to tip of finger to urethra before bed. Wash hands well after application. Use everyday once a day for the first two weeks. Then 3 times a week. 04/02/21   Hollice Espy, MD  ketorolac (ACULAR) 0.5 % ophthalmic solution Place 1 drop into the left eye 4 (four) times daily.    [provider]  moxifloxacin (VIGAMOX) 0.5 % ophthalmic solution Place 1 drop into the left eye 4 (four) times daily.    [provider]  MYRBETRIQ 50 MG TB24 tablet TAKE 1 TABLET BY MOUTH EVERY DAY 09/23/21   Hollice Espy, MD  prednisoLONE acetate (PRED FORTE) 1 % ophthalmic suspension Place 1 drop into the right eye 4 (four) times daily.    [provider]  spironolactone (ALDACTONE) 25 MG tablet Take 25 mg by mouth daily. 08/25/21   [provider]  torsemide (DEMADEX) 20 MG tablet Take by mouth. 07/22/21   [provider]      VITAL SIGNS:  Blood pressure 139/70, pulse (!) 109, temperature 98.5 F (36.9 C), temperature source Oral, resp. rate 18, height '5\' 1"'$  (1.549 m), weight 70.3 kg, SpO2 95 %.  PHYSICAL EXAMINATION:  Physical Exam  GENERAL:  71 y.o.-year-old Caucasian female patient lying in the bed with no acute distress.  EYES: Pupils equal, round, reactive to light and accommodation. No scleral icterus. Extraocular muscles intact.  HEENT: Head atraumatic, normocephalic. Oropharynx and nasopharynx clear.  NECK:  Supple, no jugular venous distention. No thyroid enlargement, no tenderness.  LUNGS: Diminished expiratory airflow with harsh vesicular breathing and occasional expiratory rhonchi and wheezes.  No use of accessory muscles of respiration.  CARDIOVASCULAR:  Regular rate and rhythm, S1, S2 normal. No murmurs, rubs, or gallops.  ABDOMEN: Soft, nondistended, nontender. Bowel sounds present. No organomegaly or mass.  EXTREMITIES: No pedal edema, cyanosis, or clubbing.  NEUROLOGIC: Cranial nerves II through XII are intact. Muscle strength 5/5 in all extremities. Sensation intact. Gait not checked.  PSYCHIATRIC: The patient is alert and oriented x 3.  Normal affect and good eye contact. SKIN: No obvious rash, lesion, or ulcer.   LABORATORY PANEL:   CBC Recent Labs  Lab 02/14/22 0415  WBC 8.0  HGB 12.4  HCT 39.4  PLT 204   ------------------------------------------------------------------------------------------------------------------  Chemistries  Recent Labs  Lab 02/14/22 0415  NA 142  K 4.4  CL 104  CO2 34*  GLUCOSE 139*  BUN 20  CREATININE 0.76  CALCIUM 9.4   ------------------------------------------------------------------------------------------------------------------  Cardiac Enzymes No results for input(s): "TROPONINI" in the last 168 hours. ------------------------------------------------------------------------------------------------------------------  RADIOLOGY:  CT Angio Chest PE W and/or  Wo Contrast  Result Date: 02/14/2022 CLINICAL DATA:  Shortness of breath EXAM: CT ANGIOGRAPHY CHEST WITH CONTRAST TECHNIQUE: Multidetector CT imaging of the chest was performed using the standard protocol during bolus administration of intravenous contrast. Multiplanar CT image reconstructions and MIPs were obtained to evaluate the vascular anatomy. RADIATION DOSE REDUCTION: This exam was performed according to the departmental dose-optimization program which includes automated exposure control, adjustment of the mA and/or kV according to patient size and/or use of iterative reconstruction technique. CONTRAST:  19m OMNIPAQUE IOHEXOL 350 MG/ML SOLN COMPARISON:  05/16/2019 FINDINGS: Cardiovascular: Contrast injection is sufficient to  demonstrate satisfactory opacification of the pulmonary arteries to the segmental level. There is no pulmonary embolus or evidence of right heart strain. The size of the main pulmonary artery is normal. Heart size is normal, with no pericardial effusion. The course and caliber of the aorta are normal. There is no atherosclerotic calcification. No acute aortic syndrome. Mediastinum/Nodes: No mediastinal, hilar or axillary lymphadenopathy. Normal visualized thyroid. Thoracic esophageal course is normal. Lungs/Pleura: Airways are patent. There is emphysema. No pleural effusion, lobar consolidation, pneumothorax or pulmonary infarction. Upper Abdomen: Contrast bolus timing is not optimized for evaluation of the abdominal organs. The visualized portions of the organs of the upper abdomen are normal. Musculoskeletal: No chest wall abnormality. No bony spinal canal stenosis. Review of the MIP images confirms the above findings. IMPRESSION: 1. No pulmonary embolus or acute aortic syndrome. Aortic Atherosclerosis (ICD10-I70.0) and Emphysema (ICD10-J43.9). Electronically Signed   By: KUlyses JarredM.D.   On: 02/14/2022 02:40   DG Chest 2 View  Result Date: 02/13/2022 CLINICAL DATA:  Shortness of breath.  History COPD. EXAM: CHEST - 2 VIEW COMPARISON:  Chest CT 05/16/2019 FINDINGS: The lungs are mildly emphysematous but clear. The sulci are sharp. Stable mediastinum with mild aortic tortuosity and calcification. The heart is slightly enlarged with normal caliber central vasculature. There is osteopenia with mild-to-moderate thoracic kyphodextroscoliosis. No spinal compression fracture is seen. IMPRESSION: No evidence of acute chest disease. Emphysema. Aortic atherosclerosis. Osteopenia with thoracic kyphodextroscoliosis. Stable COPD chest. Electronically Signed   By: KTelford NabM.D.   On: 02/13/2022 22:03      IMPRESSION AND PLAN:  Assessment and Plan: * COPD exacerbation ([place] - The patient will be admitted to  a medically monitored bed. - We will place the patient IV steroid therapy with IV Solu-Medrol as well as nebulized bronchodilator therapy with duonebs q.i.d. and q.4 hours p.r.n..Marland Kitchen- Mucolytic therapy will be provided with Mucinex and antibiotic therapy with IV Rocephin. - O2 protocol will be followed.   Acute respiratory failure with hypoxia (HCC) - O2 protocol will be followed. - Management otherwise as above.  Overactive bladder - We will continue Myrbetriq.  Essential hypertension - The patient will be placed on as needed IV labetalol.  Her husband DVT prophylaxis: Lovenox.  Advanced Care Planning:  Code Status: The patient is DNR/DNI.  This was discussed with her and her husband.  Family Communication:  The plan of care was discussed in details with the patient (and husband). I answered all questions. The patient agreed to proceed with the above mentioned plan. Further management will depend upon hospital course. Disposition Plan: Back to previous home environment Consults called: none.  All the records are reviewed and case discussed with ED provider.  Status is: Inpatient   At the time of the admission, it appears that the appropriate admission status for this patient is inpatient.  This is judged to be reasonable and  necessary in order to provide the required intensity of service to ensure the patient's safety given the presenting symptoms, physical exam findings and initial radiographic and laboratory data in the context of comorbid conditions.  The patient requires inpatient status due to high intensity of service, high risk of further deterioration and high frequency of surveillance required.  I certify that at the time of admission, it is my clinical judgment that the patient will require inpatient hospital care extending more than 2 midnights.                            Dispo: The patient is from: Home              Anticipated d/c is to: Home              Patient currently  is not medically stable to d/c.              Difficult to place patient: No  Christel Mormon M.D on 02/14/2022 at 5:03 AM  [facility]   From 7 PM-7 AM, contact night-coverage [email or web address]  CC: Primary care physician; Baxter Hire, MD

## 2022-02-14 NOTE — Hospital Course (Addendum)
Taken from H&P.  Abigail Hill is a 71 y.o. Caucasian female with medical history significant for hypertension, COPD and recurrent UTI, who presented to the emergency room with a Kalisetti of worsening dyspnea with associated dry cough and hypoxia with pulse oximetry of 82% on room air at home when checked by her husband.  She denies chest pain or palpitations.  No orthopnea or paroxysmal nocturnal dyspnea or worsening lower extremity edema.   ED course.  On arrival she was mildly tachypneic and hypoxic at 86% on room air which improved to 97% on 2 L of oxygen.  Labs pertinent for CO2 of 34, blood glucose of 139.  COVID-19 and influenza PCR negative.  RSV negative.  D-dimer mildly elevated at 0.81.  Troponin negative. CTA was negative for PE, emphysematous changes and no other acute abnormality.  Patient received steroid and bronchodilator in ED.  12/15: Procalcitonin negative.  Patient continued to require 2 L of oxygen with no baseline oxygen use.  Discontinuing ceftriaxone.  Added Zithromax and prednisone. Also checking full respiratory viral panel to determine the cause of COPD exacerbation. Patient stopped smoking 15 years ago. She follow-up with Dr. [name] as pulmonologist.  We will again ambulate patient with pulse ox tomorrow to determine the home oxygen requirement.  Can be discharged home tomorrow if remains stable

## 2022-02-14 NOTE — Assessment & Plan Note (Signed)
-   The patient will be admitted to a medically monitored bed. - We will place the patient IV steroid therapy with IV Solu-Medrol as well as nebulized bronchodilator therapy with duonebs q.i.d. and q.4 hours p.r.n.Marland Kitchen - Mucolytic therapy will be provided with Mucinex and antibiotic therapy with IV Rocephin. - O2 protocol will be followed.

## 2022-02-14 NOTE — Progress Notes (Signed)
PHARMACIST - PHYSICIAN ORDER COMMUNICATION  CONCERNING: P&T Medication Policy on Herbal Medications  DESCRIPTION:  This patient's order(s) for: Cranberry 300 mg has been noted.  This product(s) is classified as an "herbal" or natural product. Due to a lack of definitive safety studies or FDA approval, nonstandard manufacturing practices, plus the potential risk of unknown drug-drug interactions while on inpatient medications, the Pharmacy and Therapeutics Committee does not permit the use of "herbal" or natural products of this type within [facility].   ACTION TAKEN: The pharmacy department is unable to verify this order at this time.  Please reevaluate patient's clinical condition at discharge and address if the herbal or natural product(s) should be resumed at that time.  Renda Rolls, PharmD, [facility] 02/14/2022 3:55 AM

## 2022-02-14 NOTE — Assessment & Plan Note (Signed)
-   O2 protocol will be followed. - Management otherwise as above.

## 2022-02-14 NOTE — ED Notes (Signed)
This RN assisted pt to bedside commode. Pt O2sat went to 87% room air with standing and ADL. Pt recovered to 92% on 2L at rest and mild activity.

## 2022-02-14 NOTE — Assessment & Plan Note (Signed)
-   We will continue Myrbetriq. 

## 2022-02-14 NOTE — Progress Notes (Signed)
PHARMACIST - PHYSICIAN COMMUNICATION  CONCERNING: P&T Medication Policy Regarding Oral Bisphosphonates  RECOMMENDATION: Your order for alendronate (Fosamax), ibandronate (Boniva), or risedronate (Actonel) has been discontinued at this time.  If the patient's post-hospital medical condition warrants safe use of this class of drugs, please resume the pre-hospital regimen upon discharge.  DESCRIPTION:  Alendronate (Fosamax), ibandronate (Boniva), and risedronate (Actonel) can cause severe esophageal erosions in patients who are unable to remain upright at least 30 minutes after taking this medication.   Since brief interruptions in therapy are thought to have minimal impact on bone mineral density, the [place] has established that bisphosphonate orders should be routinely discontinued during hospitalization.   To override this safety policy and permit administration of Boniva, Fosamax, or Actonel in the hospital, prescribers must write "DO NOT HOLD" in the comments section when placing the order for this class of medications.  Renda Rolls, PharmD, [facility] 02/14/2022 3:55 AM

## 2022-02-14 NOTE — Progress Notes (Addendum)
No charge progress note.  Abigail Hill is a 71 y.o. Caucasian female with medical history significant for hypertension, COPD and recurrent UTI, who presented to the emergency room with a Kalisetti of worsening dyspnea with associated dry cough and hypoxia with pulse oximetry of 82% on room air at home when checked by her husband.  She denies chest pain or palpitations.  No orthopnea or paroxysmal nocturnal dyspnea or worsening lower extremity edema.   ED course.  On arrival she was mildly tachypneic and hypoxic at 86% on room air which improved to 97% on 2 L of oxygen.  Labs pertinent for CO2 of 34, blood glucose of 139.  COVID-19 and influenza PCR negative.  RSV negative.  D-dimer mildly elevated at 0.81.  Troponin negative. CTA was negative for PE, emphysematous changes and no other acute abnormality.  Patient received steroid and bronchodilator in ED.  12/15: Procalcitonin negative.  Patient continued to require 2 L of oxygen with no baseline oxygen use.  Discontinuing ceftriaxone.  Added Zithromax and prednisone. Also checking full respiratory viral panel to determine the cause of COPD exacerbation. Patient stopped smoking 15 years ago. She follow-up with Dr. [name] as pulmonologist.  General.  Frail elderly lady, in no acute distress. Pulmonary.  Lungs clear bilaterally, normal respiratory effort. CV.  Regular rate and rhythm, no JVD, rub or murmur. Abdomen.  Soft, nontender, nondistended, BS positive. CNS.  Alert and oriented .  No focal neurologic deficit. Extremities.  1+ LE edema, no cyanosis, pulses intact and symmetrical. Psychiatry.  Judgment and insight appears normal.   We will again ambulate patient with pulse ox tomorrow to determine the home oxygen requirement.  Can be discharged home tomorrow if remains stable. Discussed with husband at bedside.

## 2022-02-14 NOTE — ED Provider Notes (Signed)
[facility] Provider Note    Event Date/Time   First MD Initiated Contact with Patient 02/14/22 0006     (approximate)   History   Shortness of Breath   HPI  Abigail Hill is a 71 y.o. female with history of hypertension, COPD who has been off of oxygen since the spring 2023 who presents to the emergency department with progressively worsening dyspnea on exertion for the past 2 weeks and hypoxia at home.  Sats have been in the upper 80s.  She denies any fevers, cough or congestion, chest pain.  No history of PE or DVT.  No wheezing.   History provided by patient and husband.    Past Medical History:  Diagnosis Date   Abnormal gait 12/18/2015   BP (high blood pressure) 08/24/2015   Cancer ([place])    skin   Constriction of ureter (postoperative) 07/28/2015   Hydronephrosis 07/14/2015   Hypertension    Left foot drop 12/18/2015   Recurrent UTI    UPJ (ureteropelvic junction) obstruction 07/28/2015   UTI (lower urinary tract infection) 07/14/2015    Past Surgical History:  Procedure Laterality Date   broken foot  2012   COLONOSCOPY WITH PROPOFOL N/A 06/30/2017   Procedure: COLONOSCOPY WITH PROPOFOL;  Surgeon: Lollie Sails, MD;  Location: [facility] ENDOSCOPY;  Service: Endoscopy;  Laterality: N/A;   kidney repair  1985   repaired torn blood vessel in kidney    MEDICATIONS:  Prior to Admission medications   Medication Sig Start Date End Date Taking? Authorizing Provider  alendronate (FOSAMAX) 70 MG tablet Take 70 mg by mouth every Monday.    [provider]  aspirin 81 MG chewable tablet Chew 81 mg by mouth daily.    [provider]  Calcium Carb-Cholecalciferol (OYSTER SHELL CALCIUM) 500-400 MG-UNIT TABS Take 1 tablet by mouth daily.    [provider]  Cranberry 300 MG tablet Take 300 mg by mouth 2 (two) times daily.    [provider]  estradiol (ESTRACE) 0.1 MG/GM vaginal cream Place 1 Applicatorful vaginally at  bedtime. Estrogen Cream Instruction Discard applicator Apply pea sized amount to tip of finger to urethra before bed. Wash hands well after application. Use everyday once a day for the first two weeks. Then 3 times a week. 04/02/21   Hollice Espy, MD  MYRBETRIQ 50 MG TB24 tablet TAKE 1 TABLET BY MOUTH EVERY DAY 09/23/21   Hollice Espy, MD  spironolactone (ALDACTONE) 25 MG tablet Take 25 mg by mouth daily. 08/25/21   [provider]  torsemide (DEMADEX) 20 MG tablet Take by mouth. 07/22/21   [provider]    Physical Exam   Triage Vital Signs: ED Triage Vitals  Enc Vitals Group     BP 02/13/22 2121 126/71     Pulse Rate 02/13/22 2121 85     Resp 02/13/22 2121 (!) 22     Temp 02/13/22 2121 98.5 F (36.9 C)     Temp Source 02/13/22 2121 Oral     SpO2 02/13/22 2121 (!) 87 %     Weight 02/13/22 2122 155 lb (70.3 kg)     Height 02/13/22 2122 '5\' 1"'$  (1.549 m)     Head Circumference --      Peak Flow --      Pain Score 02/13/22 2122 0     Pain Loc --      Pain Edu? --      Excl. in [place]? --  Most recent vital signs: Vitals:   02/14/22 0430 02/14/22 0500  BP: 130/72 121/65  Pulse: 99 98  Resp: 19 (!) 25  Temp:    SpO2: 98% 97%    CONSTITUTIONAL: Alert and oriented and responds appropriately to questions. Well-appearing; well-nourished, elderly HEAD: Normocephalic, atraumatic EYES: Conjunctivae clear, pupils appear equal, sclera nonicteric ENT: normal nose; moist mucous membranes NECK: Supple, normal ROM CARD: RRR; S1 and S2 appreciated; no murmurs, no clicks, no rubs, no gallops RESP: Normal chest excursion without splinting or tachypnea; breath sounds clear and equal bilaterally; no wheezes, no rhonchi, no rales, patient is hypoxic on room air, speaking full sentences ABD/GI: Normal bowel sounds; non-distended; soft, non-tender, no rebound, no guarding, no peritoneal signs BACK: The back appears normal EXT: Normal ROM in all joints; no deformity noted, no  edema; no cyanosis SKIN: Normal color for age and race; warm; no rash on exposed skin NEURO: Moves all extremities equally, normal speech PSYCH: The patient's mood and manner are appropriate.   ED Results / Procedures / Treatments   LABS: (all labs ordered are listed, but only abnormal results are displayed) Labs Reviewed  BASIC METABOLIC PANEL - Abnormal; Notable for the following components:      Result Value   Glucose, Bld 106 (*)    BUN 24 (*)    All other components within normal limits  D-DIMER, QUANTITATIVE - Abnormal; Notable for the following components:   D-Dimer, Quant 0.81 (*)    All other components within normal limits  BASIC METABOLIC PANEL - Abnormal; Notable for the following components:   CO2 34 (*)    Glucose, Bld 139 (*)    Anion gap 4 (*)    All other components within normal limits  RESP PANEL BY RT-PCR (RSV, FLU A&B, COVID)  [id number]  CBC  CBC  TROPONIN I (HIGH SENSITIVITY)  TROPONIN I (HIGH SENSITIVITY)     EKG:  EKG Interpretation  Date/Time:  Thursday February 13 2022 21:32:03 EST Ventricular Rate:  89 PR Interval:  134 QRS Duration: 78 QT Interval:  322 QTC Calculation: 391 R Axis:   -24 Text Interpretation: Normal sinus rhythm Low voltage QRS Cannot rule out Anterior infarct , age undetermined Abnormal ECG When compared with ECG of 17-May-2019 01:04, No significant change was found Confirmed by Pryor Curia [phone]) on 02/14/2022 12:22:58 AM         RADIOLOGY: My personal review and interpretation of imaging: Chest x-ray clear.  I have personally reviewed all radiology reports.   CT Angio Chest PE W and/or Wo Contrast  Result Date: 02/14/2022 CLINICAL DATA:  Shortness of breath EXAM: CT ANGIOGRAPHY CHEST WITH CONTRAST TECHNIQUE: Multidetector CT imaging of the chest was performed using the standard protocol during bolus administration of intravenous contrast. Multiplanar CT image reconstructions and MIPs were obtained to evaluate the  vascular anatomy. RADIATION DOSE REDUCTION: This exam was performed according to the departmental dose-optimization program which includes automated exposure control, adjustment of the mA and/or kV according to patient size and/or use of iterative reconstruction technique. CONTRAST:  19m OMNIPAQUE IOHEXOL 350 MG/ML SOLN COMPARISON:  05/16/2019 FINDINGS: Cardiovascular: Contrast injection is sufficient to demonstrate satisfactory opacification of the pulmonary arteries to the segmental level. There is no pulmonary embolus or evidence of right heart strain. The size of the main pulmonary artery is normal. Heart size is normal, with no pericardial effusion. The course and caliber of the aorta are normal. There is no atherosclerotic calcification. No acute aortic syndrome. Mediastinum/Nodes: No  mediastinal, hilar or axillary lymphadenopathy. Normal visualized thyroid. Thoracic esophageal course is normal. Lungs/Pleura: Airways are patent. There is emphysema. No pleural effusion, lobar consolidation, pneumothorax or pulmonary infarction. Upper Abdomen: Contrast bolus timing is not optimized for evaluation of the abdominal organs. The visualized portions of the organs of the upper abdomen are normal. Musculoskeletal: No chest wall abnormality. No bony spinal canal stenosis. Review of the MIP images confirms the above findings. IMPRESSION: 1. No pulmonary embolus or acute aortic syndrome. Aortic Atherosclerosis (ICD10-I70.0) and Emphysema (ICD10-J43.9). Electronically Signed   By: Ulyses Jarred M.D.   On: 02/14/2022 02:40   DG Chest 2 View  Result Date: 02/13/2022 CLINICAL DATA:  Shortness of breath.  History COPD. EXAM: CHEST - 2 VIEW COMPARISON:  Chest CT 05/16/2019 FINDINGS: The lungs are mildly emphysematous but clear. The sulci are sharp. Stable mediastinum with mild aortic tortuosity and calcification. The heart is slightly enlarged with normal caliber central vasculature. There is osteopenia with  mild-to-moderate thoracic kyphodextroscoliosis. No spinal compression fracture is seen. IMPRESSION: No evidence of acute chest disease. Emphysema. Aortic atherosclerosis. Osteopenia with thoracic kyphodextroscoliosis. Stable COPD chest. Electronically Signed   By: Telford Nab M.D.   On: 02/13/2022 22:03     PROCEDURES:  Critical Care performed: Yes, see critical care procedure note(s)   CRITICAL CARE Performed by: Cyril Mourning Shawntia Mangal   Total critical care time: 35 minutes  Critical care time was exclusive of separately billable procedures and treating other patients.  Critical care was necessary to treat or prevent imminent or life-threatening deterioration.  Critical care was time spent personally by me on the following activities: development of treatment plan with patient and/or surrogate as well as nursing, discussions with consultants, evaluation of patient's response to treatment, examination of patient, obtaining history from patient or surrogate, ordering and performing treatments and interventions, ordering and review of laboratory studies, ordering and review of radiographic studies, pulse oximetry and re-evaluation of patient's condition.   Marland Kitchen1-3 Lead EKG Interpretation  Performed by: Chariti Havel, Delice Bison, DO Authorized by: Braileigh Landenberger, Delice Bison, DO     Interpretation: normal     ECG rate:  98   ECG rate assessment: normal     Rhythm: sinus rhythm     Ectopy: none     Conduction: normal       IMPRESSION / MDM / ASSESSMENT AND PLAN / ED COURSE  I reviewed the triage vital signs and the nursing notes.    Patient here for dyspnea on exertion and hypoxia at home.  Off of oxygen at home for several months.  The patient is on the cardiac monitor to evaluate for evidence of arrhythmia and/or significant heart rate changes.   DIFFERENTIAL DIAGNOSIS (includes but not limited to):   COPD exacerbation, pneumonia, COVID, flu, PE, ACS   Patient's presentation is most consistent with  acute presentation with potential threat to life or bodily function.   PLAN: Will obtain CBC, BMP, troponin x 2, D-dimer, chest x-ray, COVID and flu swabs.  Will give breathing treatments, steroids.   MEDICATIONS GIVEN IN ED: Medications  spironolactone (ALDACTONE) tablet 25 mg (has no administration in time range)  aspirin chewable tablet 81 mg (has no administration in time range)  torsemide (DEMADEX) tablet 20 mg (has no administration in time range)  estradiol (ESTRACE) vaginal cream 1 Applicatorful (has no administration in time range)  mirabegron ER (MYRBETRIQ) tablet 50 mg (has no administration in time range)  calcium-vitamin D (OSCAL WITH D) 500-5 MG-MCG per tablet 1 tablet (  has no administration in time range)  cefTRIAXone (ROCEPHIN) 1 g in sodium chloride 0.9 % 100 mL IVPB (1 g Intravenous New Bag/Given 02/14/22 0418)  enoxaparin (LOVENOX) injection 40 mg (has no administration in time range)  0.9 %  sodium chloride infusion (has no administration in time range)  acetaminophen (TYLENOL) tablet 650 mg (has no administration in time range)    Or  acetaminophen (TYLENOL) suppository 650 mg (has no administration in time range)  traZODone (DESYREL) tablet 25 mg (has no administration in time range)  ondansetron (ZOFRAN) tablet 4 mg (has no administration in time range)    Or  ondansetron (ZOFRAN) injection 4 mg (has no administration in time range)  magnesium hydroxide (MILK OF MAGNESIA) suspension 30 mL (has no administration in time range)  guaiFENesin (MUCINEX) 12 hr tablet 600 mg (has no administration in time range)  chlorpheniramine-HYDROcodone (TUSSIONEX) 10-8 MG/5ML suspension 5 mL (has no administration in time range)  ipratropium-albuterol (DUONEB) 0.5-2.5 (3) MG/3ML nebulizer solution 3 mL (3 mLs Nebulization Given 02/14/22 0113)  methylPREDNISolone sodium succinate (SOLU-MEDROL) 125 mg/2 mL injection 125 mg (125 mg Intravenous Given 02/14/22 0057)  iohexol (OMNIPAQUE)  350 MG/ML injection 75 mL (75 mLs Intravenous Contrast Given 02/14/22 0225)     ED COURSE: Patient's labs show no leukocytosis.  Normal hemoglobin.  Normal electrolytes other than elevated bicarb which appears to be chronic for her and likely from her COPD.  Troponin x 2 negative.  COVID, flu and RSV negative.  Chest x-ray reviewed and interpreted by myself and the radiologist and shows no acute abnormality.  D-dimer elevated.  Will obtain CTA of the chest.  Will attempt to obtain ambulatory sat off oxygen.   CTA of the chest reviewed and interpreted by myself and the radiologist and shows no PE, infiltrate or edema.  She does have emphysematous changes.  Nurse reports that soon as he took her off of oxygen sats dropped to 86% on room air at rest.  Suspect symptoms secondary to underlying COPD and emphysema.  Will discuss with hospitalist for admission.   CONSULTS:  Consulted and discussed patient's case with hospitalist, Dr. Sidney Ace.  I have recommended admission and consulting physician agrees and will place admission orders.  Patient (and family if present) agree with this plan.   I reviewed all nursing notes, vitals, pertinent previous records.  All labs, EKGs, imaging ordered have been independently reviewed and interpreted by myself.    OUTSIDE RECORDS REVIEWED: Reviewed last pulmonology note in August 2023.       FINAL CLINICAL IMPRESSION(S) / ED DIAGNOSES   Final diagnoses:  DOE (dyspnea on exertion)  Acute respiratory failure with hypoxia ([place])     Rx / DC Orders   ED Discharge Orders     None        Note:  This document was prepared using Dragon voice recognition software and may include unintentional dictation errors.   Steele Ledonne, Delice Bison, DO 02/14/22 [phone]

## 2022-02-14 NOTE — Assessment & Plan Note (Signed)
-   The patient will be placed on as needed IV labetalol.

## 2022-02-15 DIAGNOSIS — I10 Essential (primary) hypertension: Secondary | ICD-10-CM | POA: Diagnosis not present

## 2022-02-15 DIAGNOSIS — R0609 Other forms of dyspnea: Principal | ICD-10-CM

## 2022-02-15 DIAGNOSIS — J441 Chronic obstructive pulmonary disease with (acute) exacerbation: Secondary | ICD-10-CM | POA: Diagnosis not present

## 2022-02-15 DIAGNOSIS — J9601 Acute respiratory failure with hypoxia: Secondary | ICD-10-CM | POA: Diagnosis not present

## 2022-02-15 LAB — RESPIRATORY PANEL BY PCR

## 2022-02-15 MED ORDER — GUAIFENESIN-DM 100-10 MG/5ML PO SYRP: 5.0000 mL | ORAL_SOLUTION | ORAL | 0 refills | Status: DC | PRN

## 2022-02-15 MED ORDER — AZITHROMYCIN 250 MG PO TABS: 250.0000 mg | ORAL_TABLET | Freq: Every day | ORAL | 0 refills | Status: AC

## 2022-02-15 MED ORDER — PREDNISONE 50 MG PO TABS: ORAL_TABLET | ORAL | 0 refills | Status: DC

## 2022-02-15 NOTE — Discharge Summary (Signed)
Physician Discharge Summary   Patient: Abigail Hill MRN: [id number] DOB: Dec 14, 1950  Admit date:     02/14/2022  Discharge date: 02/15/22  Discharge Physician: Lorella Nimrod   PCP: Baxter Hire, MD   Recommendations at discharge:  Follow-up with PCP Follow-up with pulmonology  Discharge Diagnoses: Principal Problem:   COPD exacerbation ([place]) Active Problems:   Acute respiratory failure with hypoxia (HCC)   Essential hypertension   Overactive bladder   DOE (dyspnea on exertion)   Hospital Course: Taken from H&P.  Abigail Hill is a 71 y.o. Caucasian female with medical history significant for hypertension, COPD and recurrent UTI, who presented to the emergency room with a [place] of worsening dyspnea with associated dry cough and hypoxia with pulse oximetry of 82% on room air at home when checked by her husband.  She denies chest pain or palpitations.  No orthopnea or paroxysmal nocturnal dyspnea or worsening lower extremity edema.   ED course.  On arrival she was mildly tachypneic and hypoxic at 86% on room air which improved to 97% on 2 L of oxygen.  Labs pertinent for CO2 of 34, blood glucose of 139.  COVID-19 and influenza PCR negative.  RSV negative.  D-dimer mildly elevated at 0.81.  Troponin negative. CTA was negative for PE, emphysematous changes and no other acute abnormality.  Patient received steroid and bronchodilator in ED.  12/15: Procalcitonin negative.  Patient continued to require 2 L of oxygen with no baseline oxygen use.  Discontinuing ceftriaxone.  Added Zithromax and prednisone. Also checking full respiratory viral panel to determine the cause of COPD exacerbation. Patient stopped smoking 15 years ago. She follow-up with Dr. [name] as pulmonologist.  12/16: Hemodynamically stable.  Respiratory viral panel negative.  Patient was desaturating to 82% on room air while resting.  No more wheezing.  Otherwise appears to be at baseline.  Most likely becoming  oxygen dependent due to her prior history of emphysema and COPD. Her saturations slowly recovered to 90% on 2 L of oxygen.  Patient is being discharged on 2 L of oxygen and need to have a close follow-up with her pulmonologist for further recommendations.  She is also being given 3 more days of Zithromax and prednisone to complete the course for concern of COPD exacerbation.  Patient will continue with rest of her home medications as she was taking it before and follow-up with her providers for further recommendations.  Assessment and Plan: * COPD exacerbation ([place]) - The patient will be admitted to a medically monitored bed. - We will place the patient IV steroid therapy with IV Solu-Medrol as well as nebulized bronchodilator therapy with duonebs q.i.d. and q.4 hours p.r.n.Marland Kitchen - Mucolytic therapy will be provided with Mucinex and antibiotic therapy with IV Rocephin. - O2 protocol will be followed.   Acute respiratory failure with hypoxia (HCC) - O2 protocol will be followed. - Management otherwise as above.  Overactive bladder - We will continue Myrbetriq.  Essential hypertension - The patient will be placed on as needed IV labetalol.   Consultants: None Procedures performed: None Disposition: Home Diet recommendation:  Discharge Diet Orders (From admission, onward)     Start     Ordered   02/15/22 0000  Diet - low sodium heart healthy        02/15/22 1040           Cardiac diet DISCHARGE MEDICATION: Allergies as of 02/15/2022       Reactions   Oxycodone  Medication List     TAKE these medications    alendronate 70 MG tablet Commonly known as: FOSAMAX Take 70 mg by mouth every Monday.   aspirin 81 MG chewable tablet Chew 81 mg by mouth daily.   azithromycin 250 MG tablet Commonly known as: ZITHROMAX Take 1 tablet (250 mg total) by mouth daily for 3 days. Start taking on: February 16, 2022   Combivent Respimat 20-100 MCG/ACT Aers respimat Generic  drug: Ipratropium-Albuterol Inhale 1 puff into the lungs every 6 (six) hours as needed.   Cranberry 300 MG tablet Take 300 mg by mouth 2 (two) times daily.   estradiol 0.1 MG/GM vaginal cream Commonly known as: ESTRACE Place 1 Applicatorful vaginally at bedtime. Estrogen Cream Instruction Discard applicator Apply pea sized amount to tip of finger to urethra before bed. Wash hands well after application. Use everyday once a day for the first two weeks. Then 3 times a week.   guaiFENesin-dextromethorphan 100-10 MG/5ML syrup Commonly known as: ROBITUSSIN DM Take 5 mLs by mouth every 4 (four) hours as needed for cough.   ketorolac 0.5 % ophthalmic solution Commonly known as: ACULAR Place 1 drop into the left eye 4 (four) times daily.   moxifloxacin 0.5 % ophthalmic solution Commonly known as: VIGAMOX Place 1 drop into the left eye 4 (four) times daily.   Myrbetriq 50 MG Tb24 tablet Generic drug: mirabegron ER TAKE 1 TABLET BY MOUTH EVERY DAY   Oyster Shell Calcium 500-400 MG-UNIT Tabs Take 1 tablet by mouth daily.   prednisoLONE acetate 1 % ophthalmic suspension Commonly known as: PRED FORTE Place 1 drop into the right eye 4 (four) times daily.   predniSONE 50 MG tablet Commonly known as: DELTASONE 1 tablet daily for next 3 days Start taking on: February 16, 2022   torsemide 20 MG tablet Commonly known as: DEMADEX Take 10 mg by mouth daily.               Durable Medical Equipment  (From admission, onward)           Start     Ordered   02/15/22 1036  For home use only DME oxygen  Once       Question Answer Comment  Length of Need Lifetime   Mode or (Route) Nasal cannula   Liters per Minute 2   Frequency Continuous (stationary and portable oxygen unit needed)   Oxygen conserving device Yes   Oxygen delivery system Gas      02/15/22 1035            Follow-up Information     Baxter Hire, MD. Schedule an appointment as soon as possible for a  visit in 1 week(s).   Specialty: Internal Medicine Contact information: [place] [address] [phone]         Ottie Glazier, MD. Schedule an appointment as soon as possible for a visit.   Specialty: Pulmonary Disease Contact information: [place] [address] [phone]                Discharge Exam: Danley Danker Weights   02/13/22 2122  Weight: 70.3 kg   General.  Frail elderly lady, in no acute distress. Pulmonary.  Lungs clear bilaterally, normal respiratory effort. CV.  Regular rate and rhythm, no JVD, rub or murmur. Abdomen.  Soft, nontender, nondistended, BS positive. CNS.  Alert and oriented .  No focal neurologic deficit. Extremities.  No edema, no cyanosis, pulses intact and  symmetrical. Psychiatry.  Judgment and insight appears normal.   Condition at discharge: stable  The results of significant diagnostics from this hospitalization (including imaging, microbiology, ancillary and laboratory) are listed below for reference.   Imaging Studies: CT Angio Chest PE W and/or Wo Contrast  Result Date: 02/14/2022 CLINICAL DATA:  Shortness of breath EXAM: CT ANGIOGRAPHY CHEST WITH CONTRAST TECHNIQUE: Multidetector CT imaging of the chest was performed using the standard protocol during bolus administration of intravenous contrast. Multiplanar CT image reconstructions and MIPs were obtained to evaluate the vascular anatomy. RADIATION DOSE REDUCTION: This exam was performed according to the departmental dose-optimization program which includes automated exposure control, adjustment of the mA and/or kV according to patient size and/or use of iterative reconstruction technique. CONTRAST:  65m OMNIPAQUE IOHEXOL 350 MG/ML SOLN COMPARISON:  05/16/2019 FINDINGS: Cardiovascular: Contrast injection is sufficient to demonstrate satisfactory opacification of the pulmonary arteries to the segmental level. There is no pulmonary embolus or  evidence of right heart strain. The size of the main pulmonary artery is normal. Heart size is normal, with no pericardial effusion. The course and caliber of the aorta are normal. There is no atherosclerotic calcification. No acute aortic syndrome. Mediastinum/Nodes: No mediastinal, hilar or axillary lymphadenopathy. Normal visualized thyroid. Thoracic esophageal course is normal. Lungs/Pleura: Airways are patent. There is emphysema. No pleural effusion, lobar consolidation, pneumothorax or pulmonary infarction. Upper Abdomen: Contrast bolus timing is not optimized for evaluation of the abdominal organs. The visualized portions of the organs of the upper abdomen are normal. Musculoskeletal: No chest wall abnormality. No bony spinal canal stenosis. Review of the MIP images confirms the above findings. IMPRESSION: 1. No pulmonary embolus or acute aortic syndrome. Aortic Atherosclerosis (ICD10-I70.0) and Emphysema (ICD10-J43.9). Electronically Signed   By: KUlyses JarredM.D.   On: 02/14/2022 02:40   DG Chest 2 View  Result Date: 02/13/2022 CLINICAL DATA:  Shortness of breath.  History COPD. EXAM: CHEST - 2 VIEW COMPARISON:  Chest CT 05/16/2019 FINDINGS: The lungs are mildly emphysematous but clear. The sulci are sharp. Stable mediastinum with mild aortic tortuosity and calcification. The heart is slightly enlarged with normal caliber central vasculature. There is osteopenia with mild-to-moderate thoracic kyphodextroscoliosis. No spinal compression fracture is seen. IMPRESSION: No evidence of acute chest disease. Emphysema. Aortic atherosclerosis. Osteopenia with thoracic kyphodextroscoliosis. Stable COPD chest. Electronically Signed   By: KTelford NabM.D.   On: 02/13/2022 22:03   MR KNEE RIGHT WO CONTRAST  Result Date: 02/04/2022 CLINICAL DATA:  Fall on December 07, 2021, persistent right knee pain since that time. EXAM: MRI OF THE RIGHT KNEE WITHOUT CONTRAST TECHNIQUE: Multiplanar, multisequence MR imaging  of the knee was performed. No intravenous contrast was administered. COMPARISON:  None Available. FINDINGS: Despite efforts by the technologist and patient, motion artifact is present on today's exam and could not be eliminated. This reduces exam sensitivity and specificity. MENISCI Medial meniscus: Accentuated signal along the periphery of the posterior horn for example on image 21 of series 9, difficult to interpret in the context of the underlying motion artifact. I am skeptical that this is a peripheral vertical tear although inspection of the time of arthroscopy may be warranted. Lateral meniscus: Radial tear of the posterior horn possibly with small flap component for example as shown on images 8 through 11 of series 12 and faintly appreciable on image 18 of series 11 and image 18 series 8. There is peripheral extrusion of the meniscus along with a diminutive posterolateral segment and possible secondary flap component  extending cephalad from the posterolateral meniscus on images 8 through 10 of series 8, adjacent to the popliteus tendon. LIGAMENTS Cruciates:  Unremarkable Collaterals: Accentuated signal proximally in the fibular collateral ligament on images 14 through 15 of series 7, compatible with grade 2 sprain. CARTILAGE Patellofemoral: Mild chondral thinning along the posterior patellar ridge. Medial:  Unremarkable Lateral: Cannot exclude a an osteochondral lesion along the posterolateral margin of the lateral femoral condyle on image 17 series 8. There is lateral compartmental articular space narrowing and some suspected subcortical sclerosis posterolaterally in lateral compartment on image 15 series 6. Joint:  Moderate knee joint effusion. Popliteal Fossa: Small Baker's cyst. Infiltrative edema the popliteal space. Extensor Mechanism:  Unremarkable Bones: Marrow edema laterally in the lateral femoral condyle, possibly at least partially related to the osteochondral lesion and underlying degenerative  findings. Marrow edema in the medial tibial plateau, most confluent posteriorly, cause uncertain the although bone bruising or stress injury is a possibility. Other: Scattered subcutaneous edema around the knee. IMPRESSION: 1. Radial tear of the posterior horn lateral meniscus with suspected small flap component extending cephalad from the posterolateral meniscus. 2. Accentuated signal along the periphery of the posterior horn medial meniscus, difficult to interpret in the context of the underlying motion artifact. I am skeptical that this is a peripheral vertical tear although inspection of the time of arthroscopy may be warranted. 3. Grade 2 sprain of the fibular collateral ligament proximally. 4. Marrow edema laterally in the lateral femoral condyle, possibly at least partially related to the osteochondral lesion and underlying degenerative findings. 5. Marrow edema in the medial tibial plateau, most confluent posteriorly, cause uncertain although bone bruising or stress injury is a possibility. 6. Moderate knee joint effusion with small Baker's cyst. Infiltrative edema in the popliteal space. 7. Mild chondral thinning along the posterior patellar ridge. Electronically Signed   By: Van Clines M.D.   On: 02/04/2022 14:14    Microbiology: Results for orders placed or performed during the hospital encounter of 02/14/22  Resp panel by RT-PCR (RSV, Flu A&B, Covid) Anterior Nasal Swab     Status: None   Collection Time: 02/14/22 12:56 AM   Specimen: Anterior Nasal Swab  Result Value Ref Range Status   SARS Coronavirus 2 by RT PCR NEGATIVE NEGATIVE Final   Influenza A by PCR NEGATIVE NEGATIVE Final   Influenza B by PCR NEGATIVE NEGATIVE Final   Resp Syncytial Virus by PCR NEGATIVE NEGATIVE Final    Comment: Performed at [facility], [address] [place], [place] [address]  Respiratory (~20 pathogens) panel by PCR     Status: None   Collection Time: 02/14/22 12:54 PM   Specimen:  Nasopharyngeal Swab; Respiratory  Result Value Ref Range Status   Adenovirus NOT DETECTED NOT DETECTED Final   Coronavirus 229E NOT DETECTED NOT DETECTED Final    Comment: (NOTE) The Coronavirus on the Respiratory Panel, DOES NOT test for the novel  Coronavirus (2019 nCoV)    Coronavirus HKU1 NOT DETECTED NOT DETECTED Final   Coronavirus NL63 NOT DETECTED NOT DETECTED Final   Coronavirus OC43 NOT DETECTED NOT DETECTED Final   Metapneumovirus NOT DETECTED NOT DETECTED Final   Rhinovirus / Enterovirus NOT DETECTED NOT DETECTED Final   Influenza A NOT DETECTED NOT DETECTED Final   Influenza B NOT DETECTED NOT DETECTED Final   Parainfluenza Virus 1 NOT DETECTED NOT DETECTED Final   Parainfluenza Virus 2 NOT DETECTED NOT DETECTED Final   Parainfluenza Virus 3 NOT DETECTED NOT DETECTED  Final   Parainfluenza Virus 4 NOT DETECTED NOT DETECTED Final   Respiratory Syncytial Virus NOT DETECTED NOT DETECTED Final   Bordetella pertussis NOT DETECTED NOT DETECTED Final   Bordetella Parapertussis NOT DETECTED NOT DETECTED Final   Chlamydophila pneumoniae NOT DETECTED NOT DETECTED Final   Mycoplasma pneumoniae NOT DETECTED NOT DETECTED Final    Comment: Performed at [facility], [place] [address], [place], [place] [address]    Labs: CBC: Recent Labs  Lab 02/13/22 2125 02/14/22 0415  WBC 9.2 8.0  HGB 12.4 12.4  HCT 40.0 39.4  MCV 99.8 99.2  PLT 214 287   Basic Metabolic Panel: Recent Labs  Lab 02/13/22 2125 02/14/22 0415  NA 136 142  K 5.0 4.4  CL 100 104  CO2 28 34*  GLUCOSE 106* 139*  BUN 24* 20  CREATININE 0.95 0.76  CALCIUM 9.3 9.4   Liver Function Tests: No results for input(s): "AST", "ALT", "ALKPHOS", "BILITOT", "PROT", "ALBUMIN" in the last 168 hours. CBG: No results for input(s): "GLUCAP" in the last 168 hours.  Discharge time spent: greater than 30 minutes.  This record has been created using Systems analyst. Errors have been sought and  corrected,but may not always be located. Such creation errors do not reflect on the standard of care.   Signed: Lorella Nimrod, MD [facility] 02/15/2022

## 2022-02-15 NOTE — TOC Transition Note (Signed)
Transition of Care [facility]) - CM/SW Discharge Note   Patient Details  Name: MAALLE STARRETT MRN: [id number] Date of Birth: [birth year]  [facility] CM/SW Contact:  Izola Price, RN Phone Number: 02/15/2022, 12:09 PM   Clinical Narrative:  12/16: Patient discharging today and new DME oxygen orders. Spoke with spouse and patient, had used [facility] in the past. Explained may not be able to reach them for delivery today and if not [facility] was okay. Unable to reach [facility] at this time. [employer] notified and will deliver transport oxygen to room and arrange for home condenser set up. No HH orders or other DME orders notes. Simmie Davies RN CM            Patient Goals and CMS Choice        Discharge Placement                       Discharge Plan and Services                DME Arranged: Oxygen DME Agency: [facility] Date DME Agency Contacted: 02/15/22 Time DME Agency Contacted: [phone] Representative spoke with at DME Agency: [place] ([place]) Interventions     Readmission Risk Interventions     No data to display

## 2022-02-15 NOTE — Progress Notes (Signed)
Received call from [place] that pt had a 5 sec run of VT at 0646. Pt assessed and was asymptomatic. MD aware. Will continue to monitor.

## 2022-02-17 ENCOUNTER — Other Ambulatory Visit: Payer: Self-pay

## 2022-02-17 ENCOUNTER — Ambulatory Visit
Admission: RE | Admit: 2022-02-17 | Discharge: 2022-02-17 | Disposition: A | Discharge status: Home / Self Care | Payer: Self-pay | Source: Ambulatory Visit | Attending: Orthopedic Surgery | Admitting: Orthopedic Surgery

## 2022-02-17 DIAGNOSIS — J441 Chronic obstructive pulmonary disease with (acute) exacerbation: Secondary | ICD-10-CM | POA: Diagnosis not present

## 2022-02-17 DIAGNOSIS — Z049 Encounter for examination and observation for unspecified reason: Secondary | ICD-10-CM

## 2022-02-18 DIAGNOSIS — H353221 Exudative age-related macular degeneration, left eye, with active choroidal neovascularization: Secondary | ICD-10-CM | POA: Diagnosis not present

## 2022-02-18 DIAGNOSIS — Z9981 Dependence on supplemental oxygen: Secondary | ICD-10-CM | POA: Diagnosis not present

## 2022-02-18 DIAGNOSIS — J9612 Chronic respiratory failure with hypercapnia: Secondary | ICD-10-CM | POA: Diagnosis not present

## 2022-02-18 DIAGNOSIS — G912 (Idiopathic) normal pressure hydrocephalus: Secondary | ICD-10-CM | POA: Diagnosis not present

## 2022-02-18 DIAGNOSIS — J441 Chronic obstructive pulmonary disease with (acute) exacerbation: Secondary | ICD-10-CM | POA: Diagnosis not present

## 2022-02-18 DIAGNOSIS — Z6826 Body mass index (BMI) 26.0-26.9, adult: Secondary | ICD-10-CM | POA: Diagnosis not present

## 2022-02-19 ENCOUNTER — Emergency Department: Payer: PPO

## 2022-02-19 ENCOUNTER — Inpatient Hospital Stay
Admission: EM | Admit: 2022-02-19 | Discharge: 2022-02-21 | DRG: 189 | Disposition: A | Discharge status: Home / Self Care | Payer: PPO | Attending: Internal Medicine | Admitting: Internal Medicine

## 2022-02-19 ENCOUNTER — Other Ambulatory Visit: Payer: Self-pay

## 2022-02-19 DIAGNOSIS — I5031 Acute diastolic (congestive) heart failure: Secondary | ICD-10-CM | POA: Diagnosis not present

## 2022-02-19 DIAGNOSIS — G9341 Metabolic encephalopathy: Secondary | ICD-10-CM | POA: Diagnosis not present

## 2022-02-19 DIAGNOSIS — Z885 Allergy status to narcotic agent status: Secondary | ICD-10-CM | POA: Diagnosis not present

## 2022-02-19 DIAGNOSIS — I5032 Chronic diastolic (congestive) heart failure: Secondary | ICD-10-CM | POA: Diagnosis not present

## 2022-02-19 DIAGNOSIS — Z87440 Personal history of urinary (tract) infections: Secondary | ICD-10-CM | POA: Diagnosis not present

## 2022-02-19 DIAGNOSIS — J449 Chronic obstructive pulmonary disease, unspecified: Secondary | ICD-10-CM | POA: Diagnosis not present

## 2022-02-19 DIAGNOSIS — J9622 Acute and chronic respiratory failure with hypercapnia: Principal | ICD-10-CM | POA: Diagnosis present

## 2022-02-19 DIAGNOSIS — J432 Centrilobular emphysema: Secondary | ICD-10-CM | POA: Diagnosis not present

## 2022-02-19 DIAGNOSIS — Z993 Dependence on wheelchair: Secondary | ICD-10-CM | POA: Diagnosis not present

## 2022-02-19 DIAGNOSIS — M23207 Derangement of unspecified meniscus due to old tear or injury, left knee: Secondary | ICD-10-CM | POA: Diagnosis not present

## 2022-02-19 DIAGNOSIS — R509 Fever, unspecified: Secondary | ICD-10-CM | POA: Diagnosis not present

## 2022-02-19 DIAGNOSIS — R8290 Unspecified abnormal findings in urine: Secondary | ICD-10-CM | POA: Diagnosis present

## 2022-02-19 DIAGNOSIS — G912 (Idiopathic) normal pressure hydrocephalus: Secondary | ICD-10-CM | POA: Diagnosis present

## 2022-02-19 DIAGNOSIS — Z1152 Encounter for screening for COVID-19: Secondary | ICD-10-CM

## 2022-02-19 DIAGNOSIS — Z85828 Personal history of other malignant neoplasm of skin: Secondary | ICD-10-CM

## 2022-02-19 DIAGNOSIS — R0600 Dyspnea, unspecified: Secondary | ICD-10-CM | POA: Diagnosis not present

## 2022-02-19 DIAGNOSIS — J189 Pneumonia, unspecified organism: Secondary | ICD-10-CM | POA: Diagnosis not present

## 2022-02-19 DIAGNOSIS — J9621 Acute and chronic respiratory failure with hypoxia: Secondary | ICD-10-CM | POA: Diagnosis not present

## 2022-02-19 DIAGNOSIS — I10 Essential (primary) hypertension: Secondary | ICD-10-CM | POA: Diagnosis not present

## 2022-02-19 DIAGNOSIS — Z7983 Long term (current) use of bisphosphonates: Secondary | ICD-10-CM | POA: Diagnosis not present

## 2022-02-19 DIAGNOSIS — R4182 Altered mental status, unspecified: Secondary | ICD-10-CM | POA: Diagnosis not present

## 2022-02-19 DIAGNOSIS — J439 Emphysema, unspecified: Secondary | ICD-10-CM | POA: Diagnosis not present

## 2022-02-19 DIAGNOSIS — I110 Hypertensive heart disease with heart failure: Secondary | ICD-10-CM | POA: Diagnosis not present

## 2022-02-19 DIAGNOSIS — Z79899 Other long term (current) drug therapy: Secondary | ICD-10-CM

## 2022-02-19 DIAGNOSIS — N3941 Urge incontinence: Secondary | ICD-10-CM | POA: Diagnosis present

## 2022-02-19 DIAGNOSIS — Z7982 Long term (current) use of aspirin: Secondary | ICD-10-CM | POA: Diagnosis not present

## 2022-02-19 DIAGNOSIS — J9602 Acute respiratory failure with hypercapnia: Secondary | ICD-10-CM

## 2022-02-19 DIAGNOSIS — Z87891 Personal history of nicotine dependence: Secondary | ICD-10-CM

## 2022-02-19 DIAGNOSIS — R531 Weakness: Secondary | ICD-10-CM | POA: Diagnosis not present

## 2022-02-19 LAB — URINALYSIS, COMPLETE (UACMP) WITH MICROSCOPIC
Bilirubin Urine: NEGATIVE
Glucose, UA: NEGATIVE mg/dL
Hgb urine dipstick: NEGATIVE
Ketones, ur: NEGATIVE mg/dL
Nitrite: NEGATIVE
Protein, ur: NEGATIVE mg/dL
Specific Gravity, Urine: 1.018 (ref 1.005–1.030)
pH: 8 (ref 5.0–8.0)

## 2022-02-19 LAB — COMPREHENSIVE METABOLIC PANEL
ALT: 50 U/L — ABNORMAL HIGH (ref 0–44)
AST: 20 U/L (ref 15–41)
Albumin: 3.7 g/dL (ref 3.5–5.0)
Alkaline Phosphatase: 122 U/L (ref 38–126)
Anion gap: 8 (ref 5–15)
BUN: 21 mg/dL (ref 8–23)
CO2: 39 mmol/L — ABNORMAL HIGH (ref 22–32)
Calcium: 9.5 mg/dL (ref 8.9–10.3)
Chloride: 93 mmol/L — ABNORMAL LOW (ref 98–111)
Creatinine, Ser: 0.79 mg/dL (ref 0.44–1.00)
GFR, Estimated: >60 mL/min (ref >60)
Glucose, Bld: 129 mg/dL — ABNORMAL HIGH (ref 70–99)
Potassium: 4.5 mmol/L (ref 3.5–5.1)
Sodium: 140 mmol/L (ref 135–145)
Total Bilirubin: 0.7 mg/dL (ref 0.3–1.2)
Total Protein: 7 g/dL (ref 6.5–8.1)

## 2022-02-19 LAB — BLOOD GAS, VENOUS
Acid-Base Excess: 21.2 mmol/L — ABNORMAL HIGH (ref 0.0–2.0)
Bicarbonate: 51.5 mmol/L — ABNORMAL HIGH (ref 20.0–28.0)
O2 Saturation: 87.9 %
Patient temperature: 37
pCO2, Ven: 87 mmHg (ref 44–60)
pH, Ven: 7.38 (ref 7.25–7.43)
pO2, Ven: 56 mmHg — ABNORMAL HIGH (ref 32–45)

## 2022-02-19 LAB — BLOOD GAS, ARTERIAL
Acid-Base Excess: 20.1 mmol/L — ABNORMAL HIGH (ref 0.0–2.0)
Bicarbonate: 47.2 mmol/L — ABNORMAL HIGH (ref 20.0–28.0)
Delivery systems: POSITIVE
Expiratory PAP: 5 cmH2O
FIO2: 28 %
Inspiratory PAP: 15 cmH2O
O2 Saturation: 97.6 %
Patient temperature: 37
pCO2 arterial: 62 mmHg — ABNORMAL HIGH (ref 32–48)
pH, Arterial: 7.49 — ABNORMAL HIGH (ref 7.35–7.45)
pO2, Arterial: 72 mmHg — ABNORMAL LOW (ref 83–108)

## 2022-02-19 LAB — RESP PANEL BY RT-PCR (RSV, FLU A&B, COVID)  RVPGX2
Influenza A by PCR: NEGATIVE
Influenza B by PCR: NEGATIVE
Resp Syncytial Virus by PCR: NEGATIVE
SARS Coronavirus 2 by RT PCR: NEGATIVE

## 2022-02-19 LAB — CBC
HCT: 42.1 % (ref 36.0–46.0)
Hemoglobin: 12.6 g/dL (ref 12.0–15.0)
MCH: 31.1 pg (ref 26.0–34.0)
MCHC: 29.9 g/dL — ABNORMAL LOW (ref 30.0–36.0)
MCV: 104 fL — ABNORMAL HIGH (ref 80.0–100.0)
Platelets: 219 10*3/uL (ref 150–400)
RBC: 4.05 MIL/uL (ref 3.87–5.11)
RDW: 14.1 % (ref 11.5–15.5)
WBC: 10 10*3/uL (ref 4.0–10.5)
nRBC: 0 % (ref 0.0–0.2)

## 2022-02-19 LAB — TROPONIN I (HIGH SENSITIVITY): Troponin I (High Sensitivity): 8 ng/L (ref <18)

## 2022-02-19 LAB — LACTIC ACID, PLASMA: Lactic Acid, Venous: 0.8 mmol/L (ref 0.5–1.9)

## 2022-02-19 LAB — TSH: TSH: 0.499 u[IU]/mL (ref 0.350–4.500)

## 2022-02-19 LAB — BRAIN NATRIURETIC PEPTIDE: B Natriuretic Peptide: 185.7 pg/mL — ABNORMAL HIGH (ref 0.0–100.0)

## 2022-02-19 MED ORDER — AZITHROMYCIN 250 MG PO TABS
250.0000 mg | ORAL_TABLET | Freq: Every day | ORAL | Status: DC
  Administered 2022-02-20 – 2022-02-21 (×2): 250 mg via ORAL
  Filled 2022-02-19 (×2): qty 1

## 2022-02-19 MED ORDER — KETOROLAC TROMETHAMINE 0.5 % OP SOLN
1.0000 [drp] | Freq: Four times a day (QID) | OPHTHALMIC | Status: DC
  Administered 2022-02-20 – 2022-02-21 (×6): 1 [drp] via OPHTHALMIC
  Filled 2022-02-19: qty 3

## 2022-02-19 MED ORDER — METHYLPREDNISOLONE SODIUM SUCC 125 MG IJ SOLR
125.0000 mg | Freq: Once | INTRAMUSCULAR | Status: AC
  Administered 2022-02-19: 125 mg via INTRAVENOUS
  Filled 2022-02-19: qty 2

## 2022-02-19 MED ORDER — MIRABEGRON ER 50 MG PO TB24
50.0000 mg | ORAL_TABLET | Freq: Every day | ORAL | Status: DC
  Administered 2022-02-20 – 2022-02-21 (×2): 50 mg via ORAL
  Filled 2022-02-19 (×3): qty 1

## 2022-02-19 MED ORDER — ONDANSETRON HCL 4 MG/2ML IJ SOLN: 4.0000 mg | Freq: Four times a day (QID) | INTRAMUSCULAR | Status: DC | PRN

## 2022-02-19 MED ORDER — ONDANSETRON HCL 4 MG PO TABS: 4.0000 mg | ORAL_TABLET | Freq: Four times a day (QID) | ORAL | Status: DC | PRN

## 2022-02-19 MED ORDER — ENOXAPARIN SODIUM 40 MG/0.4ML IJ SOSY
40.0000 mg | PREFILLED_SYRINGE | INTRAMUSCULAR | Status: DC
  Administered 2022-02-19 – 2022-02-20 (×2): 40 mg via SUBCUTANEOUS
  Filled 2022-02-19 (×2): qty 0.4

## 2022-02-19 MED ORDER — SODIUM CHLORIDE 0.9% FLUSH
3.0000 mL | Freq: Two times a day (BID) | INTRAVENOUS | Status: DC
  Administered 2022-02-19 – 2022-02-20 (×3): 3 mL via INTRAVENOUS

## 2022-02-19 MED ORDER — ESTRADIOL 0.1 MG/GM VA CREA
1.0000 | TOPICAL_CREAM | VAGINAL | Status: DC
  Filled 2022-02-19: qty 42.5

## 2022-02-19 MED ORDER — ASPIRIN 81 MG PO CHEW
81.0000 mg | CHEWABLE_TABLET | Freq: Every day | ORAL | Status: DC
  Administered 2022-02-20 – 2022-02-21 (×2): 81 mg via ORAL
  Filled 2022-02-19 (×2): qty 1

## 2022-02-19 MED ORDER — OYSTER SHELL CALCIUM/D3 500-5 MG-MCG PO TABS
1.0000 | ORAL_TABLET | Freq: Every day | ORAL | Status: DC
  Administered 2022-02-20 – 2022-02-21 (×2): 1 via ORAL
  Filled 2022-02-19 (×2): qty 1

## 2022-02-19 MED ORDER — TORSEMIDE 20 MG PO TABS
10.0000 mg | ORAL_TABLET | Freq: Every day | ORAL | Status: DC
  Administered 2022-02-20: 10 mg via ORAL
  Filled 2022-02-19 (×2): qty 1

## 2022-02-19 MED ORDER — IPRATROPIUM-ALBUTEROL 0.5-2.5 (3) MG/3ML IN SOLN: 3.0000 mL | Freq: Four times a day (QID) | RESPIRATORY_TRACT | Status: DC | PRN

## 2022-02-19 MED ORDER — SODIUM CHLORIDE 0.9% FLUSH: 3.0000 mL | INTRAVENOUS | Status: DC | PRN

## 2022-02-19 MED ORDER — PREDNISOLONE ACETATE 1 % OP SUSP
1.0000 [drp] | Freq: Four times a day (QID) | OPHTHALMIC | Status: DC
  Administered 2022-02-20 (×2): 1 [drp] via OPHTHALMIC
  Filled 2022-02-19 (×2): qty 5

## 2022-02-19 MED ORDER — ACETAMINOPHEN 325 MG PO TABS: 650.0000 mg | ORAL_TABLET | Freq: Four times a day (QID) | ORAL | Status: DC | PRN

## 2022-02-19 MED ORDER — IPRATROPIUM-ALBUTEROL 0.5-2.5 (3) MG/3ML IN SOLN
3.0000 mL | RESPIRATORY_TRACT | Status: AC
  Administered 2022-02-19 (×3): 3 mL via RESPIRATORY_TRACT
  Filled 2022-02-19 (×3): qty 3

## 2022-02-19 MED ORDER — MOXIFLOXACIN HCL 0.5 % OP SOLN: 1.0000 [drp] | Freq: Four times a day (QID) | OPHTHALMIC | Status: DC

## 2022-02-19 MED ORDER — GUAIFENESIN-DM 100-10 MG/5ML PO SYRP
5.0000 mL | ORAL_SOLUTION | ORAL | Status: DC | PRN
  Administered 2022-02-20: 5 mL via ORAL
  Filled 2022-02-19: qty 10

## 2022-02-19 MED ORDER — ACETAMINOPHEN 650 MG RE SUPP: 650.0000 mg | Freq: Four times a day (QID) | RECTAL | Status: DC | PRN

## 2022-02-19 MED ORDER — SODIUM CHLORIDE 0.9 % IV SOLN: 250.0000 mL | INTRAVENOUS | Status: DC | PRN

## 2022-02-19 NOTE — Assessment & Plan Note (Signed)
Chronic and stable.   

## 2022-02-19 NOTE — Assessment & Plan Note (Signed)
Not acutely exacerbated  Continue.  Bronchodilator therapy as well as inhaled steroid

## 2022-02-19 NOTE — ED Triage Notes (Signed)
Pt arrived via EMS from home. EMS initally was called to pt home at 0900 today after husband had problems waking up pt. Pt eventually came A/Ox4 and refused to come to the ED. Husband called EMS again due to pt lethargy and not able to stay awake. Pt is A/Ox4 at this time. Pt is keeping her eyes closed however she does make eye contact when a individual is talking to her. Pt was recently d/c from [place] and provided with o2. Pt wears 2l/min via [place].

## 2022-02-19 NOTE — H&P (Addendum)
History and Physical    Patient: Abigail Hill PPJ:[id number] DOB: 24-May-1950 DOA: 02/19/2022 DOS: the patient was seen and examined on 02/19/2022 PCP: Baxter Hire, MD  Patient coming from: Home  Chief Complaint:  Chief Complaint  Patient presents with   Altered Mental Status   Most of the history was obtained from patient's husband at the bedside HPI: Abigail Hill is a 71 y.o. female with medical history significant for COPD with chronic respiratory failure recently discharged home on 2 L of oxygen following hospitalization for acute COPD exacerbation on 02/15/22, hypertension, recurrent UTI, chronic diastolic dysfunction CHF who presents to the ER by EMS for evaluation of mental status changes. Per patient's husband she was discharged home on 2 L of oxygen continuous which she has been using as recommended.  They had gone for follow-up appointment with her pulmonologist and on the 2 L she was noted to have pulse oximetry in the low 80s and so they were advised to increase her oxygen to 3 L which the husband states he did once they got home.  He states that he noticed increased lethargy and sleepiness after he got back from the doctor's office which he attributed to have been tired but on the day of admission he had difficulty waking up and when she did wake up she was very confused and this concerned him so he called EMS.  EMS initially came and patient was awake, alert and oriented and refused to come to the ER but husband had to call again due to patient's inability to stay awake and increased lethargy. She had an arterial blood gas done when she arrived to the ER and it showed compensated hypercapnia.  VBG 7.38/87/56/87.9.  She is currently on BiPAP Chart review from her appointment with the pulmonologist shows that patient will require trilogy for her hypercapnic respiratory failure. During my evaluation patient is noted to be lethargic but arouses to verbal stimuli she is unable to stay  awake enough to answer any questions. She will be admitted to the hospital for further evaluation.     Review of Systems: unable to review all systems due to the inability of the patient to answer questions. Past Medical History:  Diagnosis Date   Abnormal gait 12/18/2015   BP (high blood pressure) 08/24/2015   Cancer ([place])    skin   Constriction of ureter (postoperative) 07/28/2015   Hydronephrosis 07/14/2015   Hypertension    Left foot drop 12/18/2015   Recurrent UTI    UPJ (ureteropelvic junction) obstruction 07/28/2015   UTI (lower urinary tract infection) 07/14/2015   Past Surgical History:  Procedure Laterality Date   broken foot  2012   COLONOSCOPY WITH PROPOFOL N/A 06/30/2017   Procedure: COLONOSCOPY WITH PROPOFOL;  Surgeon: Lollie Sails, MD;  Location: [facility] ENDOSCOPY;  Service: Endoscopy;  Laterality: N/A;   kidney repair  1985   repaired torn blood vessel in kidney   Social History:  reports that she has quit smoking. She has never used smokeless tobacco. She reports that she does not drink alcohol and does not use drugs.  Allergies  Allergen Reactions   Oxycodone     Family History  Problem Relation Age of Onset   Kidney disease Neg Hx    Bladder Cancer Neg Hx     Prior to Admission medications   Medication Sig Start Date End Date Taking? Authorizing Provider  alendronate (FOSAMAX) 70 MG tablet Take 70 mg by mouth every Monday.  [provider]  aspirin 81 MG chewable tablet Chew 81 mg by mouth daily.    [provider]  azithromycin (ZITHROMAX) 250 MG tablet Take 1 tablet (250 mg total) by mouth daily for 3 days. 02/16/22 02/19/22  Lorella Nimrod, MD  Calcium Carb-Cholecalciferol (OYSTER SHELL CALCIUM) 500-400 MG-UNIT TABS Take 1 tablet by mouth daily.    [provider]  COMBIVENT RESPIMAT 20-100 MCG/ACT AERS respimat Inhale 1 puff into the lungs every 6 (six) hours as needed.    [provider]  Cranberry 300 MG  tablet Take 300 mg by mouth 2 (two) times daily.    [provider]  estradiol (ESTRACE) 0.1 MG/GM vaginal cream Place 1 Applicatorful vaginally at bedtime. Estrogen Cream Instruction Discard applicator Apply pea sized amount to tip of finger to urethra before bed. Wash hands well after application. Use everyday once a day for the first two weeks. Then 3 times a week. 04/02/21   Hollice Espy, MD  guaiFENesin-dextromethorphan (ROBITUSSIN DM) 100-10 MG/5ML syrup Take 5 mLs by mouth every 4 (four) hours as needed for cough. 02/15/22   Lorella Nimrod, MD  ketorolac (ACULAR) 0.5 % ophthalmic solution Place 1 drop into the left eye 4 (four) times daily.    [provider]  moxifloxacin (VIGAMOX) 0.5 % ophthalmic solution Place 1 drop into the left eye 4 (four) times daily.    [provider]  MYRBETRIQ 50 MG TB24 tablet TAKE 1 TABLET BY MOUTH EVERY DAY 09/23/21   Hollice Espy, MD  prednisoLONE acetate (PRED FORTE) 1 % ophthalmic suspension Place 1 drop into the right eye 4 (four) times daily.    [provider]  predniSONE (DELTASONE) 50 MG tablet 1 tablet daily for next 3 days 02/16/22   Lorella Nimrod, MD  torsemide (DEMADEX) 20 MG tablet Take 10 mg by mouth daily. 07/22/21   [provider]    Physical Exam: Vitals:   02/19/22 1330 02/19/22 1338 02/19/22 1340 02/19/22 1618  BP: 130/67     Pulse: 93 93    Resp: (!) 36     Temp:      TempSrc:      SpO2: 97% 96% 96% 96%  Weight:       Physical Exam Vitals and nursing note reviewed.  Constitutional:      Comments: Lethargic but arouses to verbal stimuli.  Patient is on BiPAP  HENT:     Head: Normocephalic.     Nose: Nose normal.     Mouth/Throat:     Mouth: Mucous membranes are moist.  Eyes:     Conjunctiva/sclera: Conjunctivae normal.  Cardiovascular:     Rate and Rhythm: Normal rate and regular rhythm.  Pulmonary:     Effort: Pulmonary effort is normal.     Breath sounds: Rales present.   Abdominal:     General: Abdomen is flat.     Palpations: Abdomen is soft.     Comments: Central adiposity  Musculoskeletal:     Cervical back: Normal range of motion and neck supple.     Right lower leg: Edema present.     Left lower leg: Edema present.  Skin:    General: Skin is warm and dry.  Neurological:     Comments: Unable to assess  Psychiatric:     Comments: Unable to assess     Data Reviewed: Relevant notes from primary care and specialist visits, past discharge summaries as available in EHR, including Care Everywhere. Prior diagnostic testing as  pertinent to current admission diagnoses Updated medications and problem lists for reconciliation ED course, including vitals, labs, imaging, treatment and response to treatment Triage notes, nursing and pharmacy notes and ED provider's notes Notable results as noted in HPI Labs reviewed.  BNP 185, sodium 140, potassium 4.5, chloride 93, bicarb 39, glucose 129, BUN 21, creatinine 0.79, calcium 9.5, total protein 7.0, albumin 3.7, AST 20, ALT 50, alkaline phosphatase 122, white count 10.0, hemoglobin 12.6, hematocrit 42.1, platelet count 219 Respiratory viral panel is negative Chest x-ray reviewed by me shows Stable examination. Emphysema without evidence of acute cardiopulmonary process. CT scan of the head without contrast shows no acute intracranial abnormality. Re demonstrated sequela of severe chronic microvascular ischemic change and chronic communicating hydrocephalus. Note there is an acute callosal angle, which could be seen in the setting of NPH. Twelve-lead EKG reviewed by me shows sinus rhythm.  Minimal ST depressions in the lateral leads There are no new results to review at this time.  Assessment and Plan: * Acute hypercapnic respiratory failure ([place]) Patient with a history of COPD with chronic respiratory failure started on 2 L of oxygen continuous which had to be increased to 3L per her husband because of  persistent hypoxia. This could be the driver for her hypercapnia and mental status changes Will consult pulmonology Patient will benefit from trilogy which will prevent frequent readmissions for same   Acute metabolic encephalopathy Patient presents via EMS for evaluation of change in mental status. She is lethargic and arouses to loud verbal stimuli.  She is only oriented to person and place but not to time. VBG showed compensated hypercapnia Will obtain UA to rule out an infectious process Repeat ABG  COPD (chronic obstructive pulmonary disease) (HCC) Not acutely exacerbated  Continue.  Bronchodilator therapy as well as inhaled steroid  NPH (normal pressure hydrocephalus) (HCC) Chronic and stable      Advance Care Planning:   Code Status: Full Code   Consults: Pulmonary  Family Communication: Greater than 50% of time was spent discussing patient's condition and plan of care with her husband at the bedside.  All questions and concerns have been addressed.  He verbalizes understanding and agrees to the plan.  Severity of Illness: The appropriate patient status for this patient is INPATIENT. Inpatient status is judged to be reasonable and necessary in order to provide the required intensity of service to ensure the patient's safety. The patient's presenting symptoms, physical exam findings, and initial radiographic and laboratory data in the context of their chronic comorbidities is felt to place them at high risk for further clinical deterioration. Furthermore, it is not anticipated that the patient will be medically stable for discharge from the hospital within 2 midnights of admission.   * I certify that at the point of admission it is my clinical judgment that the patient will require inpatient hospital care spanning beyond 2 midnights from the point of admission due to high intensity of service, high risk for further deterioration and high frequency of surveillance  required.*  Author: Collier Bullock, MD 02/19/2022 4:56 PM  For on call review [email or web address].

## 2022-02-19 NOTE — Assessment & Plan Note (Addendum)
Patient presents via EMS for evaluation of change in mental status. She is lethargic and arouses to loud verbal stimuli.  She is only oriented to person and place but not to time. VBG showed compensated hypercapnia Will obtain UA to rule out an infectious process Repeat ABG

## 2022-02-19 NOTE — ED Provider Notes (Signed)
[facility] Provider Note    Event Date/Time   First MD Initiated Contact with Patient 02/19/22 1231     (approximate)   History   Altered Mental Status   HPI  Abigail Hill is a 71 y.o. female past medical history significant for COPD, recurrent urinary tract infections, who presents to the emergency department with altered mental status and generalized weakness.  Patient was recently admitted to the hospital for COPD exacerbation and urinary tract infection.  Was discharged on 2 L nasal cannula 2 days ago.  According to EMS patient's husband stated that she had worsening altered mental status and somnolence and he had difficulty waking her up this morning.  Patient will wake up and is able to state her name and where she is, does not answer all questions appropriately and then falls back to sleep.  Denies any chest pain at this time.  Does endorse shortness of breath and cough.  Denies dysuria, urinary urgency or frequency.     Physical Exam   Triage Vital Signs: ED Triage Vitals [02/19/22 1302]  Enc Vitals Group     BP      Pulse      Resp      Temp      Temp src      SpO2      Weight 154 lb 5.2 oz (70 kg)     Height      Head Circumference      Peak Flow      Pain Score 0     Pain Loc      Pain Edu?      Excl. in [place]?     Most recent vital signs: Vitals:   02/19/22 1340 02/19/22 1618  BP:    Pulse:    Resp:    Temp:    SpO2: 96% 96%    Physical Exam Constitutional:      Appearance: She is well-developed.  HENT:     Head: Atraumatic.  Eyes:     Conjunctiva/sclera: Conjunctivae normal.  Cardiovascular:     Rate and Rhythm: Regular rhythm.  Pulmonary:     Effort: Respiratory distress present.     Breath sounds: Wheezing present.     Comments: Tachypneic on 2 L nasal cannula.  Coarse breath sounds with diffuse end expiratory wheezing throughout all lung fields Abdominal:     General: There is no distension.  Musculoskeletal:         General: Normal range of motion.     Cervical back: Normal range of motion.  Skin:    General: Skin is warm.  Neurological:     Mental Status: She is alert. Mental status is at baseline.     IMPRESSION / MDM / ASSESSMENT AND PLAN / ED COURSE  I reviewed the triage vital signs and the nursing notes.  Differential diagnosis including infectious process, hypercarbia, COPD exacerbation, anemia, intracranial hemorrhage  EKG  I, Nathaniel Man, the attending physician, personally viewed and interpreted this ECG.   Rate: Normal  Rhythm: Normal sinus  Axis: Normal  Intervals: Normal  ST&T Change: None  No tachycardic or bradycardic dysrhythmias while on cardiac telemetry.  RADIOLOGY I independently reviewed imaging, my interpretation of imaging: CT scan of the head shows no signs of intracranial hemorrhage.  Chest x-ray with no focal findings consistent with pneumonia.  LABS (all labs ordered are listed, but only abnormal results are displayed) Labs interpreted as -  Hypercarbia but does  appear compensated with a CO2 that was 88, pH 7.38.  Mildly elevated BNP.  COVID and influenza testing are negative  Labs Reviewed  CBC - Abnormal; Notable for the following components:      Result Value   MCV 104.0 (*)    MCHC 29.9 (*)    All other components within normal limits  COMPREHENSIVE METABOLIC PANEL - Abnormal; Notable for the following components:   Chloride 93 (*)    CO2 39 (*)    Glucose, Bld 129 (*)    ALT 50 (*)    All other components within normal limits  BLOOD GAS, VENOUS - Abnormal; Notable for the following components:   pCO2, Ven 87 (*)    pO2, Ven 56 (*)    Bicarbonate 51.5 (*)    Acid-Base Excess 21.2 (*)    All other components within normal limits  BRAIN NATRIURETIC PEPTIDE - Abnormal; Notable for the following components:   B Natriuretic Peptide 185.7 (*)    All other components within normal limits  BLOOD GAS, ARTERIAL - Abnormal; Notable for the  following components:   pH, Arterial 7.49 (*)    pCO2 arterial 62 (*)    pO2, Arterial 72 (*)    Bicarbonate 47.2 (*)    Acid-Base Excess 20.1 (*)    All other components within normal limits  RESP PANEL BY RT-PCR (RSV, FLU A&B, COVID)  RVPGX2  CULTURE, BLOOD (ROUTINE X 2)  CULTURE, BLOOD (ROUTINE X 2)  LACTIC ACID, PLASMA  URINALYSIS, ROUTINE W REFLEX MICROSCOPIC  LACTIC ACID, PLASMA  TROPONIN I (HIGH SENSITIVITY)  TROPONIN I (HIGH SENSITIVITY)    TREATMENT   Given the patient's hypercarbia was placed on BiPAP.  Given DuoNeb treatment and IV Solu-Medrol.  UA is currently pending.  Consulted hospitalist for admission for AMS   PROCEDURES:  Critical Care performed: Yes  .Critical Care  Performed by: Nathaniel Man, MD Authorized by: Nathaniel Man, MD   Critical care provider statement:    Critical care time (minutes):  30   Critical care time was exclusive of:  Separately billable procedures and treating other patients   Critical care was necessary to treat or prevent imminent or life-threatening deterioration of the following conditions:  Respiratory failure   Critical care was time spent personally by me on the following activities:  Development of treatment plan with patient or surrogate, discussions with consultants, evaluation of patient's response to treatment, examination of patient, ordering and review of laboratory studies, ordering and review of radiographic studies, ordering and performing treatments and interventions, pulse oximetry, re-evaluation of patient's condition and review of old charts   Patient's presentation is most consistent with acute presentation with potential threat to life or bodily function.   MEDICATIONS ORDERED IN ED: Medications  ipratropium-albuterol (DUONEB) 0.5-2.5 (3) MG/3ML nebulizer solution 3 mL (3 mLs Nebulization Given 02/19/22 1330)  methylPREDNISolone sodium succinate (SOLU-MEDROL) 125 mg/2 mL injection 125 mg (125 mg  Intravenous Given 02/19/22 1330)    FINAL CLINICAL IMPRESSION(S) / ED DIAGNOSES   Final diagnoses:  Altered mental status, unspecified altered mental status type     Rx / DC Orders   ED Discharge Orders     None        Note:  This document was prepared using Dragon voice recognition software and may include unintentional dictation errors.   Nathaniel Man, MD 02/19/22 [phone]

## 2022-02-19 NOTE — Assessment & Plan Note (Signed)
Patient with a history of COPD with chronic respiratory failure started on 2 L of oxygen continuous which had to be increased to 3L per her husband because of persistent hypoxia. This could be the driver for her hypercapnia and mental status changes Will consult pulmonology Patient will benefit from trilogy which will prevent frequent readmissions for same

## 2022-02-19 NOTE — ED Notes (Signed)
Pt placed on purewick, tolerated well and is resting comfortably.

## 2022-02-20 ENCOUNTER — Encounter: Payer: Self-pay | Admitting: Internal Medicine

## 2022-02-20 ENCOUNTER — Inpatient Hospital Stay
Admit: 2022-02-20 | Discharge: 2022-02-20 | Disposition: A | Discharge status: Home / Self Care | Payer: PPO | Attending: Internal Medicine | Admitting: Internal Medicine

## 2022-02-20 ENCOUNTER — Other Ambulatory Visit: Payer: Self-pay

## 2022-02-20 DIAGNOSIS — G9341 Metabolic encephalopathy: Secondary | ICD-10-CM

## 2022-02-20 DIAGNOSIS — J9622 Acute and chronic respiratory failure with hypercapnia: Secondary | ICD-10-CM | POA: Diagnosis not present

## 2022-02-20 DIAGNOSIS — J9621 Acute and chronic respiratory failure with hypoxia: Secondary | ICD-10-CM

## 2022-02-20 LAB — BASIC METABOLIC PANEL
Anion gap: 8 (ref 5–15)
BUN: 26 mg/dL — ABNORMAL HIGH (ref 8–23)
CO2: 36 mmol/L — ABNORMAL HIGH (ref 22–32)
Calcium: 9.2 mg/dL (ref 8.9–10.3)
Chloride: 95 mmol/L — ABNORMAL LOW (ref 98–111)
Creatinine, Ser: 0.87 mg/dL (ref 0.44–1.00)
GFR, Estimated: >60 mL/min (ref >60)
Glucose, Bld: 104 mg/dL — ABNORMAL HIGH (ref 70–99)
Potassium: 4.6 mmol/L (ref 3.5–5.1)
Sodium: 139 mmol/L (ref 135–145)

## 2022-02-20 LAB — CBC
HCT: 38.4 % (ref 36.0–46.0)
Hemoglobin: 11.7 g/dL — ABNORMAL LOW (ref 12.0–15.0)
MCH: 31 pg (ref 26.0–34.0)
MCHC: 30.5 g/dL (ref 30.0–36.0)
MCV: 101.9 fL — ABNORMAL HIGH (ref 80.0–100.0)
Platelets: 206 10*3/uL (ref 150–400)
RBC: 3.77 MIL/uL — ABNORMAL LOW (ref 3.87–5.11)
RDW: 13.9 % (ref 11.5–15.5)
WBC: 10 10*3/uL (ref 4.0–10.5)
nRBC: 0 % (ref 0.0–0.2)

## 2022-02-20 LAB — ECHOCARDIOGRAM COMPLETE
AR max vel: 2.81 cm2
AV Area VTI: 2.47 cm2
AV Area mean vel: 2.31 cm2
AV Mean grad: 4 mmHg
AV Peak grad: 7.2 mmHg
Ao pk vel: 1.34 m/s
Area-P 1/2: 6.17 cm2
S' Lateral: 2.9 cm
Weight: 2469.15 oz

## 2022-02-20 LAB — LACTIC ACID, PLASMA: Lactic Acid, Venous: 1 mmol/L (ref 0.5–1.9)

## 2022-02-20 NOTE — Consult Note (Signed)
PULMONOLOGY         Date: 02/20/2022,   MRN# [id number] Abigail Hill 01-Dec-1950     AdmissionWeight: 70 kg                 CurrentWeight: 70 kg  Referring provider: Dr Mal Misty   CHIEF COMPLAINT:   Hypoxemic hypercapnic respiratory failure   HISTORY OF PRESENT ILLNESS   This is a very pleasant 71 yo F with hx of advanced COPD with chronic LE edema and CHF wheelchair bound status. She is with additional comorbid conditions including essential hypertension, recurrent UTI, chronic diastolic dysfunction CHF who presents to the ER by EMS for evaluation of mental status changes.  She was discharged home on 2 L of oxygen continuous which she has been using as recommended.  They had gone for follow-up appointment with her pulmonologist and on the 2 L she was noted to have pulse oximetry in the low 80s and so they were advised to increase her oxygen to 3 L which the husband states he did once they got home.  He states that he noticed increased lethargy and sleepiness after he got back from the doctor's office which he attributed to have been tired but on the day of admission he had difficulty waking up and when she did wake up she was very confused and this concerned him so he called EMS.  EMS initially came and patient was awake, alert and oriented and refused to come to the ER but husband had to call again due to patient's inability to stay awake and increased lethargy. PCCM consultation placed for lethargy with advanced COPD and hypercarbic respiratory failure.    PAST MEDICAL HISTORY   Past Medical History:  Diagnosis Date   Abnormal gait 12/18/2015   BP (high blood pressure) 08/24/2015   Cancer ([place])    skin   Constriction of ureter (postoperative) 07/28/2015   Hydronephrosis 07/14/2015   Hypertension    Left foot drop 12/18/2015   Recurrent UTI    UPJ (ureteropelvic junction) obstruction 07/28/2015   UTI (lower urinary tract infection) 07/14/2015     SURGICAL HISTORY    Past Surgical History:  Procedure Laterality Date   broken foot  2012   COLONOSCOPY WITH PROPOFOL N/A 06/30/2017   Procedure: COLONOSCOPY WITH PROPOFOL;  Surgeon: Lollie Sails, MD;  Location: [facility] ENDOSCOPY;  Service: Endoscopy;  Laterality: N/A;   kidney repair  1985   repaired torn blood vessel in kidney     FAMILY HISTORY   Family History  Problem Relation Age of Onset   Kidney disease Neg Hx    Bladder Cancer Neg Hx      SOCIAL HISTORY   Social History   Tobacco Use   Smoking status: Former   Smokeless tobacco: Never   Tobacco comments:    quit 10 years  Vaping Use   Vaping Use: Never used  Substance Use Topics   Alcohol use: Never    Alcohol/week: 0.0 standard drinks of alcohol   Drug use: No     MEDICATIONS    Home Medication:  Current Outpatient Rx   Order #: [id number] Class: Historical Med   Order #: [id number] Class: Historical Med   Order #: [id number] Class: Historical Med   Order #: [id number] Class: Normal   Order #: [id number] Class: Historical Med   Order #: [id number] Class: Normal   Order #: [id number] Class: Historical Med   Order #: [id number] Class: Normal   Order #: [id number] Class: Historical Med   Order #:  [id number] Class: Historical Med   Order #: [id number] Class: Historical Med   Order #: [id number] Class: Historical Med   Order #: [id number] Class: Normal   Order #: [id number] Class: Historical Med    Current Medication:  Current Facility-Administered Medications:    0.9 %  sodium chloride infusion, 250 mL, Intravenous, PRN, Agbata, Tochukwu, MD   acetaminophen (TYLENOL) tablet 650 mg, 650 mg, Oral, Q6H PRN **OR** acetaminophen (TYLENOL) suppository 650 mg, 650 mg, Rectal, Q6H PRN, Agbata, Tochukwu, MD   aspirin chewable tablet 81 mg, 81 mg, Oral, Daily, Agbata, Tochukwu, MD, 81 mg at 02/20/22 1030   azithromycin (ZITHROMAX) tablet 250 mg, 250 mg, Oral, Daily, Agbata, Tochukwu, MD, 250 mg at 02/20/22 1030   calcium-vitamin D (OSCAL WITH D)  500-5 MG-MCG per tablet 1 tablet, 1 tablet, Oral, Daily, Agbata, Tochukwu, MD, 1 tablet at 02/20/22 1030   enoxaparin (LOVENOX) injection 40 mg, 40 mg, Subcutaneous, Q24H, Agbata, Tochukwu, MD, 40 mg at 02/19/22 2224   estradiol (ESTRACE) vaginal cream 1 Applicatorful, 1 Applicatorful, Vaginal, Once per day on Mon Wed Fri, Agbata, Tochukwu, MD   guaiFENesin-dextromethorphan (ROBITUSSIN DM) 100-10 MG/5ML syrup 5 mL, 5 mL, Oral, Q4H PRN, Agbata, Tochukwu, MD   ipratropium-albuterol (DUONEB) 0.5-2.5 (3) MG/3ML nebulizer solution 3 mL, 3 mL, Nebulization, Q6H PRN, Agbata, Tochukwu, MD   ketorolac (ACULAR) 0.5 % ophthalmic solution 1 drop, 1 drop, Left Eye, QID, Agbata, Tochukwu, MD, 1 drop at 02/20/22 1031   mirabegron ER (MYRBETRIQ) tablet 50 mg, 50 mg, Oral, Daily, Agbata, Tochukwu, MD, 50 mg at 02/20/22 1030   ondansetron (ZOFRAN) tablet 4 mg, 4 mg, Oral, Q6H PRN **OR** ondansetron (ZOFRAN) injection 4 mg, 4 mg, Intravenous, Q6H PRN, Agbata, Tochukwu, MD   prednisoLONE acetate (PRED FORTE) 1 % ophthalmic suspension 1 drop, 1 drop, Right Eye, QID, Agbata, Tochukwu, MD   sodium chloride flush (NS) 0.9 % injection 3 mL, 3 mL, Intravenous, Q12H, Agbata, Tochukwu, MD, 3 mL at 02/20/22 1039   sodium chloride flush (NS) 0.9 % injection 3 mL, 3 mL, Intravenous, PRN, Agbata, Tochukwu, MD   torsemide (DEMADEX) tablet 10 mg, 10 mg, Oral, Daily, Agbata, Tochukwu, MD, 10 mg at 02/20/22 1030  Current Outpatient Medications:    aspirin 81 MG chewable tablet, Chew 81 mg by mouth daily., Disp: , Rfl:    Calcium Carb-Cholecalciferol (OYSTER SHELL CALCIUM) 500-400 MG-UNIT TABS, Take 1 tablet by mouth daily., Disp: , Rfl:    Cranberry 300 MG tablet, Take 300 mg by mouth daily., Disp: , Rfl:    estradiol (ESTRACE) 0.1 MG/GM vaginal cream, Place 1 Applicatorful vaginally at bedtime. Estrogen Cream Instruction Discard applicator Apply pea sized amount to tip of finger to urethra before bed. Wash hands well after  application. Use everyday once a day for the first two weeks. Then 3 times a week., Disp: 42.5 g, Rfl: 12   ketorolac (ACULAR) 0.5 % ophthalmic solution, Place 1 drop into the left eye 4 (four) times daily., Disp: , Rfl:    MYRBETRIQ 50 MG TB24 tablet, TAKE 1 TABLET BY MOUTH EVERY DAY (Patient taking differently: Take 50 mg by mouth daily.), Disp: 30 tablet, Rfl: 11   prednisoLONE acetate (PRED FORTE) 1 % ophthalmic suspension, Place 1 drop into the left eye 4 (four) times daily., Disp: , Rfl:    predniSONE (DELTASONE) 50 MG tablet, 1 tablet daily for next 3 days, Disp: 3 tablet, Rfl: 0   spironolactone (ALDACTONE) 25 MG tablet, Take 25 mg by mouth daily., Disp: , Rfl:  torsemide (DEMADEX) 20 MG tablet, Take 10 mg by mouth daily., Disp: , Rfl:    alendronate (FOSAMAX) 70 MG tablet, Take 70 mg by mouth every Monday., Disp: , Rfl:    COMBIVENT RESPIMAT 20-100 MCG/ACT AERS respimat, Inhale 1 puff into the lungs every 6 (six) hours as needed., Disp: , Rfl:    guaiFENesin-dextromethorphan (ROBITUSSIN DM) 100-10 MG/5ML syrup, Take 5 mLs by mouth every 4 (four) hours as needed for cough., Disp: 118 mL, Rfl: 0   moxifloxacin (VIGAMOX) 0.5 % ophthalmic solution, Place 1 drop into the left eye 4 (four) times daily. (Patient not taking: Reported on 02/19/2022), Disp: , Rfl:     ALLERGIES   Oxycodone     REVIEW OF SYSTEMS    Review of Systems:  Gen:  Denies  fever, sweats, chills weigh loss  HEENT: Denies blurred vision, double vision, ear pain, eye pain, hearing loss, nose bleeds, sore throat Cardiac:  No dizziness, chest pain or heaviness, chest tightness,edema Resp:   reports dyspnea chronically  Gi: Denies swallowing difficulty, stomach pain, nausea or vomiting, diarrhea, constipation, bowel incontinence Gu:  Denies bladder incontinence, burning urine Ext:   Denies Joint pain, stiffness or swelling Skin: Denies  skin rash, easy bruising or bleeding or hives Endoc:  Denies polyuria,  polydipsia , polyphagia or weight change Psych:   Denies depression, insomnia or hallucinations   Other:  All other systems negative   VS: BP 111/65 (BP Location: Right Arm)   Pulse 93   Temp 98.3 F (36.8 C) (Oral)   Resp 18   Wt 70 kg   SpO2 98%   BMI 29.16 kg/m      PHYSICAL EXAM    GENERAL:NAD, no fevers, chills, no weakness no fatigue HEAD: Normocephalic, atraumatic.  EYES: Pupils equal, round, reactive to light. Extraocular muscles intact. No scleral icterus.  MOUTH: Moist mucosal membrane. Dentition intact. No abscess noted.  EAR, NOSE, THROAT: Clear without exudates. No external lesions.  NECK: Supple. No thyromegaly. No nodules. No JVD.  PULMONARY: decreased breath sounds with mild rhonchi worse at bases bilaterally.  CARDIOVASCULAR: S1 and S2. Regular rate and rhythm. No murmurs, rubs, or gallops. No edema. Pedal pulses 2+ bilaterally.  GASTROINTESTINAL: Soft, nontender, nondistended. No masses. Positive bowel sounds. No hepatosplenomegaly.  MUSCULOSKELETAL: No swelling, clubbing, or edema. Range of motion full in all extremities.  NEUROLOGIC: Cranial nerves II through XII are intact. No gross focal neurological deficits. Sensation intact. Reflexes intact.  SKIN: No ulceration, lesions, rashes, or cyanosis. Skin warm and dry. Turgor intact.  PSYCHIATRIC: Mood, affect within normal limits. The patient is awake, alert and oriented x 3. Insight, judgment intact.       IMAGING   Reviewed CT chest from last week and CXR from yesterday.   ASSESSMENT/PLAN   Acute on chronic hypoxemic and hypercapnic respiratory failure  - patient likely has advancing COPD with worsening hypercapnia. She may need BIPAP-ST at home, ill place orders for this device.  -Patient requests to stop using [employer] and utilize [employer]   Chronic centrilobular emphysema  -continue generic COPD carepath with prn duoneb   Left meniscal tear   Hindering ambulation and PT with resultant  atlectasis, recommend IS          Thank you for allowing me to participate in the care of this patient.   Patient/Family are satisfied with care plan and all questions have been answered.    Provider disclosure: Patient with at least one acute or chronic illness  or injury that poses a threat to life or bodily function and is being managed actively during this encounter.  All of the below services have been performed independently by signing provider:  review of prior documentation from internal and or external health records.  Review of previous and current lab results.  Interview and comprehensive assessment during patient visit today. Review of current and previous chest radiographs/CT scans. Discussion of management and test interpretation with health care team and patient/family.   This document was prepared using Dragon voice recognition software and may include unintentional dictation errors.     Ottie Glazier, M.D.  Division of Pulmonary & Critical Care Medicine

## 2022-02-20 NOTE — ED Notes (Signed)
Pt and family requesting update on plan of care, Sent message to provider MD [facility] via secure chat.

## 2022-02-20 NOTE — Progress Notes (Signed)
Patient admitted from ED to room 256 accompanied by daughter Raquel Sarna and husband, Genever Hentges. VSS. A+Ox4. Skin assessed, bruises noted to right buttock, Left thigh, right knee due to falls in the last few weeks as she is unable to remain standing while grooming in the bathroom at home. She is wheelchair bound. Purewick in place. Preparing to give report to night shift who will perform full assessment. Will continue to monitor and assess with plan of care.

## 2022-02-20 NOTE — Progress Notes (Addendum)
Progress Note    Abigail Hill  OZD:[id number] DOB: February 19, 1951  DOA: 02/19/2022 PCP: Baxter Hire, MD      Brief Narrative:    Medical records reviewed and are as summarized below:  Abigail Hill is a 71 y.o. female medical history significant for wheelchair-bound status, COPD, chronic respiratory failure recently discharged from 2 L/min oxygen after hospitalization for COPD exacerbation on 02/15/2022, hypertension, recurrent UTI, chronic diastolic CHF, chronic lower extremity edema.  She saw her pulmonologist, Dr. [name], in the office on 02/18/2022.  She was noted to have oxygen saturation in the 80s on 2 L/min oxygen.  She was advised to increase oxygen to 3 L/min.  When they got home, patient became very lethargic.  She presented to the hospital because of change in altered mental status.     She was admitted to the hospital for acute on chronic hypoxemic and hypercapnic respiratory failure.      Assessment/Plan:   Principal Problem:   Acute on chronic respiratory failure with hypoxia and hypercapnia (HCC) Active Problems:   Acute metabolic encephalopathy   NPH (normal pressure hydrocephalus) (HCC)   COPD (chronic obstructive pulmonary disease) (HCC)    Body mass index is 29.16 kg/m.  Acute on chronic hypoxic and hypercapnic respiratory failure: Continue 3 times per minute oxygen via [place].  Use BiPAP at night.  Consulted transition of care team to assist with obtaining trilogy machine  Acute metabolic encephalopathy: Resolved  COPD: Continue bronchodilators and azithromycin  Abnormal urinalysis: Patient has no urinary symptoms, fever or leukocytosis.  Mental status improved without antibiotics for UTI.  Doubt UTI at this time.  Chronic diastolic CHF: Compensated.  Continue torsemide  Normal pressure hydrocephalus: No acute issues.  She takes Myrbetriq for chronic urge incontinence   Diet Order             Diet 2 gram sodium Room service appropriate?  Yes; Fluid consistency: Thin  Diet effective now                            Consultants: Pulmonologist  Procedures: None    Medications:    aspirin  81 mg Oral Daily   azithromycin  250 mg Oral Daily   calcium-vitamin D  1 tablet Oral Daily   enoxaparin (LOVENOX) injection  40 mg Subcutaneous Q24H   estradiol  1 Applicatorful Vaginal Once per day on Mon Wed Fri   ketorolac  1 drop Left Eye QID   mirabegron ER  50 mg Oral Daily   prednisoLONE acetate  1 drop Right Eye QID   sodium chloride flush  3 mL Intravenous Q12H   torsemide  10 mg Oral Daily   Continuous Infusions:  sodium chloride       Anti-infectives (From admission, onward)    Start     Dose/Rate Route Frequency Ordered Stop   02/19/22 1700  azithromycin (ZITHROMAX) tablet 250 mg        250 mg Oral Daily 02/19/22 1651                Family Communication/Anticipated D/C date and plan/Code Status   DVT prophylaxis: enoxaparin (LOVENOX) injection 40 mg Start: 02/19/22 2200     Code Status: Full Code  Family Communication: Discussed with her husband and daughter at the bedside Disposition Plan: Plan to discharge home in 1 to 2 days   Status is: Inpatient Remains inpatient appropriate because: Chronic respiratory  failure and needs trilogy machine at discharge       Subjective:   Interval events noted.  She has no complaints and she feels back to baseline.  No shortness of breath, chest pain, dysuria, abdominal pain, fever, chills, increased urinary frequency.  She said she has chronic urgency of micturition for which she takes Myrbetriq at home.  Husband and daughter were at the bedside  Objective:    Vitals:   02/20/22 0130 02/20/22 0430 02/20/22 1033 02/20/22 1034  BP: 106/67 98/63  111/65  Pulse: 76 70  93  Resp: (!) 24 (!) 22  18  Temp:  98 F (36.7 C) 98.3 F (36.8 C) 98.3 F (36.8 C)  TempSrc:  Oral Oral Oral  SpO2: 97% 99%  98%  Weight:       No data  found.  No intake or output data in the 24 hours ending 02/20/22 1347 Filed Weights   02/19/22 1302  Weight: 70 kg    Exam:  GEN: NAD SKIN: Warm and dry EYES: No pallor or icterus ENT: MMM CV: RRR PULM: CTA B ABD: soft, ND, NT, +BS CNS: AAO x 3, non focal EXT: Bilateral lower extremity pitting edema, no tenderness        Data Reviewed:   I have personally reviewed following labs and imaging studies:  Labs: Labs show the following:   Basic Metabolic Panel: Recent Labs  Lab 02/13/22 2125 02/14/22 0415 02/19/22 1248 02/20/22 0500  NA 136 142 140 139  K 5.0 4.4 4.5 4.6  CL 100 104 93* 95*  CO2 28 34* 39* 36*  GLUCOSE 106* 139* 129* 104*  BUN 24* 20 21 26*  CREATININE 0.95 0.76 0.79 0.87  CALCIUM 9.3 9.4 9.5 9.2   GFR Estimated Creatinine Clearance: 51.5 mL/min (by C-G formula based on SCr of 0.87 mg/dL). Liver Function Tests: Recent Labs  Lab 02/19/22 1248  AST 20  ALT 50*  ALKPHOS 122  BILITOT 0.7  PROT 7.0  ALBUMIN 3.7   No results for input(s): "LIPASE", "AMYLASE" in the last 168 hours. No results for input(s): "AMMONIA" in the last 168 hours. Coagulation profile No results for input(s): "INR", "PROTIME" in the last 168 hours.  CBC: Recent Labs  Lab 02/13/22 2125 02/14/22 0415 02/19/22 1248 02/20/22 0500  WBC 9.2 8.0 10.0 10.0  HGB 12.4 12.4 12.6 11.7*  HCT 40.0 39.4 42.1 38.4  MCV 99.8 99.2 104.0* 101.9*  PLT 214 204 219 206   Cardiac Enzymes: No results for input(s): "CKTOTAL", "CKMB", "CKMBINDEX", "TROPONINI" in the last 168 hours. BNP (last 3 results) No results for input(s): "PROBNP" in the last 8760 hours. CBG: No results for input(s): "GLUCAP" in the last 168 hours. D-Dimer: No results for input(s): "DDIMER" in the last 72 hours. Hgb A1c: No results for input(s): "HGBA1C" in the last 72 hours. Lipid Profile: No results for input(s): "CHOL", "HDL", "LDLCALC", "TRIG", "CHOLHDL", "LDLDIRECT" in the last 72 hours. Thyroid  function studies: Recent Labs    02/19/22 1248  TSH 0.499   Anemia work up: No results for input(s): "VITAMINB12", "FOLATE", "FERRITIN", "TIBC", "IRON", "RETICCTPCT" in the last 72 hours. Sepsis Labs: Recent Labs  Lab 02/13/22 2125 02/14/22 0415 02/19/22 1248 02/20/22 0500  PROCALCITON  --  <0.10  --   --   WBC 9.2 8.0 10.0 10.0  LATICACIDVEN  --   --  0.8  --     Microbiology Recent Results (from the past 240 hour(s))  Resp panel by RT-PCR (  RSV, Flu A&B, Covid) Anterior Nasal Swab     Status: None   Collection Time: 02/14/22 12:56 AM   Specimen: Anterior Nasal Swab  Result Value Ref Range Status   SARS Coronavirus 2 by RT PCR NEGATIVE NEGATIVE Final   Influenza A by PCR NEGATIVE NEGATIVE Final   Influenza B by PCR NEGATIVE NEGATIVE Final   Resp Syncytial Virus by PCR NEGATIVE NEGATIVE Final    Comment: Performed at [facility], [address] [place], [place] [address]  Respiratory (~20 pathogens) panel by PCR     Status: None   Collection Time: 02/14/22 12:54 PM   Specimen: Nasopharyngeal Swab; Respiratory  Result Value Ref Range Status   Adenovirus NOT DETECTED NOT DETECTED Final   Coronavirus 229E NOT DETECTED NOT DETECTED Final    Comment: (NOTE) The Coronavirus on the Respiratory Panel, DOES NOT test for the novel  Coronavirus (2019 nCoV)    Coronavirus HKU1 NOT DETECTED NOT DETECTED Final   Coronavirus NL63 NOT DETECTED NOT DETECTED Final   Coronavirus OC43 NOT DETECTED NOT DETECTED Final   Metapneumovirus NOT DETECTED NOT DETECTED Final   Rhinovirus / Enterovirus NOT DETECTED NOT DETECTED Final   Influenza A NOT DETECTED NOT DETECTED Final   Influenza B NOT DETECTED NOT DETECTED Final   Parainfluenza Virus 1 NOT DETECTED NOT DETECTED Final   Parainfluenza Virus 2 NOT DETECTED NOT DETECTED Final   Parainfluenza Virus 3 NOT DETECTED NOT DETECTED Final   Parainfluenza Virus 4 NOT DETECTED NOT DETECTED Final   Respiratory Syncytial Virus NOT  DETECTED NOT DETECTED Final   Bordetella pertussis NOT DETECTED NOT DETECTED Final   Bordetella Parapertussis NOT DETECTED NOT DETECTED Final   Chlamydophila pneumoniae NOT DETECTED NOT DETECTED Final   Mycoplasma pneumoniae NOT DETECTED NOT DETECTED Final    Comment: Performed at [facility], [place]. [address], [place], [place] [address]  Resp panel by RT-PCR (RSV, Flu A&B, Covid) Anterior Nasal Swab     Status: None   Collection Time: 02/19/22 12:48 PM   Specimen: Anterior Nasal Swab  Result Value Ref Range Status   SARS Coronavirus 2 by RT PCR NEGATIVE NEGATIVE Final    Comment: (NOTE) SARS-CoV-2 target nucleic acids are NOT DETECTED.  The SARS-CoV-2 RNA is generally detectable in upper respiratory specimens during the acute phase of infection. The lowest concentration of SARS-CoV-2 viral copies this assay can detect is 138 copies/mL. A negative result does not preclude SARS-Cov-2 infection and should not be used as the sole basis for treatment or other patient management decisions. A negative result may occur with  improper specimen collection/handling, submission of specimen other than nasopharyngeal swab, presence of viral mutation(s) within the areas targeted by this assay, and inadequate number of viral copies(<138 copies/mL). A negative result must be combined with clinical observations, patient history, and epidemiological information. The expected result is Negative.  Fact Sheet for Patients:  EntrepreneurPulse.com.au  Fact Sheet for Healthcare Providers:  IncredibleEmployment.be  This test is no t yet approved or cleared by the [place] FDA and  has been authorized for detection and/or diagnosis of SARS-CoV-2 by FDA under an Emergency Use Authorization (EUA). This EUA will remain  in effect (meaning this test can be used) for the duration of the COVID-19 declaration under Section 564(b)(1) of the Act, 21 U.S.C.section  360bbb-3(b)(1), unless the authorization is terminated  or revoked sooner.       Influenza A by PCR NEGATIVE NEGATIVE Final   Influenza B by  PCR NEGATIVE NEGATIVE Final    Comment: (NOTE) The Xpert Xpress SARS-CoV-2/FLU/RSV plus assay is intended as an aid in the diagnosis of influenza from Nasopharyngeal swab specimens and should not be used as a sole basis for treatment. Nasal washings and aspirates are unacceptable for Xpert Xpress SARS-CoV-2/FLU/RSV testing.  Fact Sheet for Patients: EntrepreneurPulse.com.au  Fact Sheet for Healthcare Providers: IncredibleEmployment.be  This test is not yet approved or cleared by the [place] FDA and has been authorized for detection and/or diagnosis of SARS-CoV-2 by FDA under an Emergency Use Authorization (EUA). This EUA will remain in effect (meaning this test can be used) for the duration of the COVID-19 declaration under Section 564(b)(1) of the Act, 21 U.S.C. section 360bbb-3(b)(1), unless the authorization is terminated or revoked.     Resp Syncytial Virus by PCR NEGATIVE NEGATIVE Final    Comment: (NOTE) Fact Sheet for Patients: EntrepreneurPulse.com.au  Fact Sheet for Healthcare Providers: IncredibleEmployment.be  This test is not yet approved or cleared by the [place] FDA and has been authorized for detection and/or diagnosis of SARS-CoV-2 by FDA under an Emergency Use Authorization (EUA). This EUA will remain in effect (meaning this test can be used) for the duration of the COVID-19 declaration under Section 564(b)(1) of the Act, 21 U.S.C. section 360bbb-3(b)(1), unless the authorization is terminated or revoked.  Performed at [facility], [address] [place], [place] [address]   Blood culture (routine x 2)     Status: None (Preliminary result)   Collection Time: 02/19/22 12:48 PM   Specimen: BLOOD  Result Value Ref Range  Status   Specimen Description BLOOD BLOOD RIGHT ARM  Final   Special Requests   Final    BOTTLES DRAWN AEROBIC AND ANAEROBIC Blood Culture adequate volume   Culture   Final    NO GROWTH < 24 HOURS Performed at [facility], [address], [place], [place] [address]    Report Status PENDING  Incomplete  Blood culture (routine x 2)     Status: None (Preliminary result)   Collection Time: 02/19/22 12:48 PM   Specimen: BLOOD  Result Value Ref Range Status   Specimen Description BLOOD BLOOD LEFT ARM  Final   Special Requests   Final    BOTTLES DRAWN AEROBIC AND ANAEROBIC Blood Culture results may not be optimal due to an inadequate volume of blood received in culture bottles   Culture   Final    NO GROWTH < 24 HOURS Performed at [facility], [address], [place],  [address]    Report Status PENDING  Incomplete    Procedures and diagnostic studies:  CT Head Wo Contrast  Result Date: 02/19/2022 CLINICAL DATA:  Altered mental status. EXAM: CT HEAD WITHOUT CONTRAST TECHNIQUE: Contiguous axial images were obtained from the base of the skull through the vertex without intravenous contrast. RADIATION DOSE REDUCTION: This exam was performed according to the departmental dose-optimization program which includes automated exposure control, adjustment of the mA and/or kV according to patient size and/or use of iterative reconstruction technique. COMPARISON:  CT Head 05/17/19, MRI brain 05/17/19 FINDINGS: Brain: Redemonstrated sequela of severe chronic microvascular ischemic change. There is also chronic communicating hydrocephalus with effacement of the sulci in bilateral cerebral hemispheres and an acute callosal angle, which is unchanged compared to prior exam 2016. No evidence of CT evidence of an acute infarction, hemorrhage. No CT evidence of an acute infarct. Vascular: No hyperdense vessel or unexpected calcification. Skull: Normal. Negative for fracture  or focal  lesion. Sinuses/Orbits: Bilateral lens replacement. Other: None IMPRESSION: 1. No acute intracranial abnormality. 2. Redemonstrated sequela of severe chronic microvascular ischemic change and chronic communicating hydrocephalus. Note there is an acute callosal angle, which could be seen in the setting of NPH. Electronically Signed   By: Marin Roberts M.D.   On: 02/19/2022 13:16   DG Chest 1 View  Result Date: 02/19/2022 CLINICAL DATA:  Dyspnea. EXAM: CHEST  1 VIEW COMPARISON:  Radiographs 02/13/2022 and 05/16/2019.  CT 02/14/2022. FINDINGS: 1250 hours. Stable mild patient rotation to the right. The heart size and mediastinal contours are stable. There are stable emphysematous changes throughout both lungs. No evidence of superimposed airspace disease, pleural effusion or pneumothorax. No acute osseous findings are evident. Telemetry leads overlie the chest. IMPRESSION: Stable examination. Emphysema without evidence of acute cardiopulmonary process. Electronically Signed   By: Richardean Sale M.D.   On: 02/19/2022 12:59               LOS: 1 day   Jolan Upchurch  [facility]   Pager on [email or web address]. If 7PM-7AM, please contact night-coverage at [email or web address]     02/20/2022, 1:47 PM

## 2022-02-20 NOTE — ED Notes (Signed)
Pt noted to have urinated in bed. Pt had purewick, but did not work. Pt cleaned and new chux, depends and purewick placed.

## 2022-02-20 NOTE — Progress Notes (Signed)
*  PRELIMINARY RESULTS* Echocardiogram 2D Echocardiogram has been performed.  Sherrie Sport 02/20/2022, 2:31 PM

## 2022-02-21 DIAGNOSIS — J9622 Acute and chronic respiratory failure with hypercapnia: Secondary | ICD-10-CM | POA: Diagnosis not present

## 2022-02-21 DIAGNOSIS — J9621 Acute and chronic respiratory failure with hypoxia: Secondary | ICD-10-CM | POA: Diagnosis not present

## 2022-02-21 DIAGNOSIS — I5032 Chronic diastolic (congestive) heart failure: Secondary | ICD-10-CM

## 2022-02-21 DIAGNOSIS — G9341 Metabolic encephalopathy: Secondary | ICD-10-CM | POA: Diagnosis not present

## 2022-02-21 MED ORDER — PREDNISOLONE ACETATE 1 % OP SUSP: 1.0000 [drp] | Freq: Every day | OPHTHALMIC | Status: DC

## 2022-02-21 MED ORDER — TORSEMIDE 20 MG PO TABS
20.0000 mg | ORAL_TABLET | Freq: Every day | ORAL | Status: DC
  Administered 2022-02-21: 20 mg via ORAL

## 2022-02-21 MED ORDER — PREDNISOLONE ACETATE 1 % OP SUSP
1.0000 [drp] | Freq: Two times a day (BID) | OPHTHALMIC | Status: DC
  Administered 2022-02-21: 1 [drp] via OPHTHALMIC
  Filled 2022-02-21: qty 1

## 2022-02-21 NOTE — TOC Initial Note (Signed)
Transition of Care [facility]) - Initial/Assessment Note    Patient Details  Name: Abigail Hill MRN: [id number] Date of Birth: Oct 12, 1950  [facility]) CM/SW Contact:    Tiburcio Bash, LCSW Phone Number: 02/21/2022, 10:01 AM  Clinical Narrative:                  CSW met with patient and spouse at bedside. TOC was consulted for trilogy needs. Patient and spouse request [employer], referral has been given to [place] with Ace Gins for trilogy set up.   ABG's were done recently. Pending email of order form to be signed by MD.   No additional discharge needs at this time..  Expected Discharge Plan: Home/Self Care Barriers to Discharge: Continued Medical Work up   Patient Goals and CMS Choice Patient states their goals for this hospitalization and ongoing recovery are:: to go home CMS Medicare.gov Compare Post Acute Care list provided to:: Patient Choice offered to / list presented to : Patient      Expected Discharge Plan and Services       Living arrangements for the past 2 months: Single Family Home                                      Prior Living Arrangements/Services Living arrangements for the past 2 months: [place] Lives with:: Self, Spouse                   Activities of Daily Living Home Assistive Devices/Equipment: Environmental consultant (specify type), Cane (specify quad or straight), Wheelchair, Dentures (specify type), Oxygen, Eyeglasses ADL Screening (condition at time of admission) Patient's cognitive ability adequate to safely complete daily activities?: Yes Is the patient deaf or have difficulty hearing?: No Does the patient have difficulty seeing, even when wearing glasses/contacts?: No Does the patient have difficulty concentrating, remembering, or making decisions?: No Patient able to express need for assistance with ADLs?: Yes Does the patient have difficulty dressing or bathing?: No Independently performs ADLs?: Yes (appropriate for  developmental age) Does the patient have difficulty walking or climbing stairs?: Yes Weakness of Legs: Both Weakness of Arms/Hands: None  Permission Sought/Granted                  Emotional Assessment              Admission diagnosis:  Altered mental status, unspecified altered mental status type [R41.82] Acute hypercapnic respiratory failure ([place]) [J96.02] Patient Active Problem List   Diagnosis Date Noted   Acute on chronic respiratory failure with hypoxia and hypercapnia ([place]) 40/34/7425   Acute metabolic encephalopathy 95/63/8756   DOE (dyspnea on exertion) 02/15/2022   COPD (chronic obstructive pulmonary disease) ([place]) 02/14/2022   Overactive bladder 02/14/2022   Nuclear sclerotic cataract of both eyes 11/25/2021   Exudative age-related macular degeneration of left eye with active choroidal neovascularization ([place]) 11/25/2021   Intermediate stage nonexudative age-related macular degeneration of both eyes 11/25/2021   Retinal pigment epithelial detachment, left 11/25/2021   COPD with acute exacerbation ([place]) 05/21/2019   Chronic respiratory failure with hypoxia ([place]) 05/19/2019   History of CVA (cerebrovascular accident) 05/17/2019   Unsteady gait 05/17/2019   Acute respiratory failure with hypoxia ([place]) 05/16/2019   NPH (normal pressure hydrocephalus) ([place]) 07/30/2016   Left leg weakness 04/20/2016   Menopausal osteoporosis 03/04/2016   Abnormal gait 12/18/2015   Left foot drop 12/18/2015  BP (high blood pressure) 08/24/2015   Hydronephrosis, left 07/28/2015   UPJ (ureteropelvic junction) obstruction 07/28/2015   Lower urinary tract infectious disease 07/14/2015   Hydronephrosis 07/14/2015   PCP:  Baxter Hire, MD Pharmacy:   [id number] [place] [place] [id number]: [phone]: [phone]    Social Determinants of Health (SDOH) Social History: [place]  No Food Insecurity (02/20/2022)  Housing: Low Risk  (02/20/2022)  Transportation Needs: No Transportation Needs (02/20/2022)  Utilities: Not At Risk (02/20/2022)  Depression (PHQ2-9): Low Risk  (01/16/2020)  Tobacco Use: Medium Risk (02/20/2022)   SDOH Interventions:     Readmission Risk Interventions     No data to display

## 2022-02-21 NOTE — Discharge Summary (Addendum)
Physician Discharge Summary   Patient: Abigail Hill: [id number] DOB: 06-03-1950  Admit date:     02/19/2022  Discharge date: 02/21/22  Discharge Physician: Abigail Hill   PCP: Abigail Hire, MD   Recommendations at discharge:    Follow up with PCP in 1 week  Discharge Diagnoses: Principal Problem:   Acute on chronic respiratory failure with hypoxia and hypercapnia (HCC) Active Problems:   Acute metabolic encephalopathy   NPH (normal pressure hydrocephalus) (HCC)   COPD (chronic obstructive pulmonary disease) (HCC)   Chronic diastolic CHF (congestive heart failure) (HCC)  Resolved Problems:   * No resolved hospital problems. Abigail Hill Course:  [name] is a 71 y.o. female medical history significant for wheelchair-bound status, COPD, chronic respiratory failure recently discharged from 2 L/min oxygen after hospitalization for COPD exacerbation on 02/15/2022, hypertension, recurrent UTI, chronic diastolic CHF, chronic lower extremity edema.  She saw her pulmonologist, Dr. Lanney Hill, in the office on 02/18/2022.  She was noted to have oxygen saturation in the 80s on 2 L/min oxygen.  She was advised to increase oxygen to 3 L/min.  When they got home, patient became very lethargic.  She presented to the hospital because of change in altered mental status.       She was admitted to the hospital for acute on chronic hypoxemic and hypercapnic respiratory failure.  She was initially treated with BiPAP.  Her condition has improved and her mental status is back to baseline. Her husband confirmed that trilogy machine was delivered to her home today.  She is deemed stable for discharge.  Discharge plan was discussed with the patient and her husband at the bedside     Assessment and Plan:   Acute on chronic hypoxic and hypercapnic respiratory failure: She is tolerating 2 L/min oxygen via nasal cannula. Trilogy machine was delivered to her house today.  She has been advised to  use trilogy every night  Acute metabolic encephalopathy: Resolved   COPD: Continue bronchodilators   Abnormal urinalysis: Patient has no urinary symptoms, fever or leukocytosis. Mental status improved without antibiotics for UTI.  Doubt UTI at this time.   Chronic diastolic CHF: Compensated.  Continue torsemide and Aldactone at discharge   Normal pressure hydrocephalus: No acute issues.  She takes Myrbetriq for chronic urge incontinence        Consultants: Pulmonologist Procedures performed: None  Disposition: Home Diet recommendation:  Discharge Diet Orders (From admission, onward)     Start     Ordered   02/21/22 0000  Diet - low sodium heart healthy        02/21/22 1615           Cardiac diet DISCHARGE MEDICATION: Allergies as of 02/21/2022       Reactions   Oxycodone Nausea And Vomiting        Medication List     STOP taking these medications    azithromycin 250 MG tablet Commonly known as: ZITHROMAX   moxifloxacin 0.5 % ophthalmic solution Commonly known as: VIGAMOX   predniSONE 50 MG tablet Commonly known as: DELTASONE       TAKE these medications    alendronate 70 MG tablet Commonly known as: FOSAMAX Take 70 mg by mouth every Monday.   aspirin 81 MG chewable tablet Chew 81 mg by mouth daily.   Combivent Respimat 20-100 MCG/ACT Aers respimat Generic drug: Ipratropium-Albuterol Inhale 1 puff into the lungs every 6 (six) hours as needed.   Cranberry 300  MG tablet Take 300 mg by mouth daily.   estradiol 0.1 MG/GM vaginal cream Commonly known as: ESTRACE Place 1 Applicatorful vaginally at bedtime. Estrogen Cream Instruction Discard applicator Apply pea sized amount to tip of finger to urethra before bed. Wash hands well after application. Use everyday once a day for the first two weeks. Then 3 times a week.   guaiFENesin-dextromethorphan 100-10 MG/5ML syrup Commonly known as: ROBITUSSIN DM Take 5 mLs by mouth every 4 (four) hours as  needed for cough.   ketorolac 0.5 % ophthalmic solution Commonly known as: ACULAR Place 1 drop into the left eye 4 (four) times daily.   Myrbetriq 50 MG Tb24 tablet Generic drug: mirabegron ER TAKE 1 TABLET BY MOUTH EVERY DAY What changed: how much to take   [place] Calcium 500-400 MG-UNIT Tabs Take 1 tablet by mouth daily.   prednisoLONE acetate 1 % ophthalmic suspension Commonly known as: PRED FORTE Place 1 drop into the left eye 4 (four) times daily.   spironolactone 25 MG tablet Commonly known as: ALDACTONE Take 25 mg by mouth daily.   torsemide 20 MG tablet Commonly known as: DEMADEX Take 10 mg by mouth daily.               Durable Medical Equipment  (From admission, onward)           Start     Ordered   02/21/22 0000  For home use only DME oxygen       Question Answer Comment  Length of Need Lifetime   Mode or (Route) Nasal cannula   Liters per Minute 2   Frequency Continuous (stationary and portable oxygen unit needed)   Oxygen conserving device Yes   Oxygen delivery system Gas      02/21/22 1146            Discharge Exam: Filed Weights   02/19/22 1302  Weight: 70 kg   GEN: NAD SKIN: Warm and dry EYES: Anicteric ENT: MMM CV: RRR PULM: CTA B ABD: soft, ND, NT, +BS CNS: AAO x 3, non focal EXT: B/l leg edema, no tenderness   Condition at discharge: good  The results of significant diagnostics from this hospitalization (including imaging, microbiology, ancillary and laboratory) are listed below for reference.   Imaging Studies: ECHOCARDIOGRAM COMPLETE  Result Date: 02/20/2022    ECHOCARDIOGRAM REPORT   Patient Name:   Abigail Hill Date of Exam: 02/20/2022 Medical Rec #:  [id number]   Height:       61.0 in Accession #:    [id number]  Weight:       154.3 lb Date of Birth:  02-Jan-1951   BSA:          1.692 m Patient Age:    71 years    BP:           111/65 mmHg Patient Gender: F           HR:           93 bpm. Exam Location:  [facility]  Procedure: 2D Echo, Cardiac Doppler and Color Doppler Indications:     CHF---acute diastolic Q30.09  History:         Patient has prior history of Echocardiogram examinations, most                  recent 05/17/2019. Risk Factors:Hypertension.  Sonographer:     Sherrie Sport Referring Phys:  [place] Diagnosing Phys: Neoma Laming  Sonographer Comments: Image  quality was excellent. IMPRESSIONS  1. Left ventricular ejection fraction, by estimation, is 50 to 55%. The left ventricle has low normal function. The left ventricle has no regional wall motion abnormalities. Left ventricular diastolic parameters are consistent with Grade I diastolic dysfunction (impaired relaxation).  2. Right ventricular systolic function is normal. The right ventricular size is normal.  3. Left atrial size was mildly dilated.  4. Right atrial size was mildly dilated.  5. The mitral valve is normal in structure. Mild mitral valve regurgitation. No evidence of mitral stenosis.  6. The aortic valve is normal in structure. Aortic valve regurgitation is not visualized. Aortic valve sclerosis/calcification is present, without any evidence of aortic stenosis.  7. The inferior vena cava is normal in size with greater than 50% respiratory variability, suggesting right atrial pressure of 3 mmHg. FINDINGS  Left Ventricle: Left ventricular ejection fraction, by estimation, is 50 to 55%. The left ventricle has low normal function. The left ventricle has no regional wall motion abnormalities. The left ventricular internal cavity size was normal in size. There is no left ventricular hypertrophy. Left ventricular diastolic parameters are consistent with Grade I diastolic dysfunction (impaired relaxation). Right Ventricle: The right ventricular size is normal. No increase in right ventricular wall thickness. Right ventricular systolic function is normal. Left Atrium: Left atrial size was mildly dilated. Right Atrium: Right atrial size was mildly  dilated. Pericardium: There is no evidence of pericardial effusion. Mitral Valve: The mitral valve is normal in structure. Mild mitral valve regurgitation. No evidence of mitral valve stenosis. Tricuspid Valve: The tricuspid valve is normal in structure. Tricuspid valve regurgitation is mild . No evidence of tricuspid stenosis. Aortic Valve: The aortic valve is normal in structure. Aortic valve regurgitation is not visualized. Aortic valve sclerosis/calcification is present, without any evidence of aortic stenosis. Aortic valve mean gradient measures 4.0 mmHg. Aortic valve peak  gradient measures 7.2 mmHg. Aortic valve area, by VTI measures 2.47 cm. Pulmonic Valve: The pulmonic valve was normal in structure. Pulmonic valve regurgitation is not visualized. No evidence of pulmonic stenosis. Aorta: The aortic root is normal in size and structure. Venous: The inferior vena cava is normal in size with greater than 50% respiratory variability, suggesting right atrial pressure of 3 mmHg. IAS/Shunts: No atrial level shunt detected by color flow Doppler.  LEFT VENTRICLE PLAX 2D LVIDd:         4.00 cm   Diastology LVIDs:         2.90 cm   LV e' medial:    7.94 cm/s LV PW:         0.90 cm   LV E/e' medial:  9.2 LV IVS:        1.10 cm   LV e' lateral:   9.90 cm/s LVOT diam:     2.00 cm   LV E/e' lateral: 7.4 LV SV:         53 LV SV Index:   32 LVOT Area:     3.14 cm  RIGHT VENTRICLE RV Basal diam:  3.40 cm RV Mid diam:    2.70 cm RV S prime:     23.70 cm/s TAPSE (M-mode): 2.7 cm LEFT ATRIUM           Index        RIGHT ATRIUM           Index LA diam:      2.80 cm 1.66 cm/m   RA Area:     10.70 cm LA  Vol [facility]): 19.1 ml 11.29 ml/m  RA Volume:   22.70 ml  13.42 ml/m LA Vol (A4C): 20.6 ml 12.18 ml/m  AORTIC VALVE AV Area (Vmax):    2.81 cm AV Area (Vmean):   2.31 cm AV Area (VTI):     2.47 cm AV Vmax:           134.00 cm/s AV Vmean:          96.400 cm/s AV VTI:            0.216 m AV Peak Grad:      7.2 mmHg AV Mean Grad:       4.0 mmHg LVOT Vmax:         120.00 cm/s LVOT Vmean:        70.800 cm/s LVOT VTI:          0.170 m LVOT/AV VTI ratio: 0.79  AORTA Ao Root diam: 3.07 cm MITRAL VALVE                TRICUSPID VALVE MV Area (PHT): 6.17 cm     TR Peak grad:   22.3 mmHg MV Decel Time: 123 msec     TR Vmax:        236.00 cm/s MV E velocity: 73.40 cm/s MV A velocity: 109.00 cm/s  SHUNTS MV E/A ratio:  0.67         Systemic VTI:  0.17 m                             Systemic Diam: 2.00 cm Neoma Laming Electronically signed by Neoma Laming Signature Date/Time: 02/20/2022/5:28:22 PM    Final    CT Head Wo Contrast  Result Date: 02/19/2022 CLINICAL DATA:  Altered mental status. EXAM: CT HEAD WITHOUT CONTRAST TECHNIQUE: Contiguous axial images were obtained from the base of the skull through the vertex without intravenous contrast. RADIATION DOSE REDUCTION: This exam was performed according to the departmental dose-optimization program which includes automated exposure control, adjustment of the mA and/or kV according to patient size and/or use of iterative reconstruction technique. COMPARISON:  CT Head 05/17/19, MRI brain 05/17/19 FINDINGS: Brain: Redemonstrated sequela of severe chronic microvascular ischemic change. There is also chronic communicating hydrocephalus with effacement of the sulci in bilateral cerebral hemispheres and an acute callosal angle, which is unchanged compared to prior exam 2016. No evidence of CT evidence of an acute infarction, hemorrhage. No CT evidence of an acute infarct. Vascular: No hyperdense vessel or unexpected calcification. Skull: Normal. Negative for fracture or focal lesion. Sinuses/Orbits: Bilateral lens replacement. Other: None IMPRESSION: 1. No acute intracranial abnormality. 2. Redemonstrated sequela of severe chronic microvascular ischemic change and chronic communicating hydrocephalus. Note there is an acute callosal angle, which could be seen in the setting of NPH. Electronically Signed   By:  Marin Roberts M.D.   On: 02/19/2022 13:16   DG Chest 1 View  Result Date: 02/19/2022 CLINICAL DATA:  Dyspnea. EXAM: CHEST  1 VIEW COMPARISON:  Radiographs 02/13/2022 and 05/16/2019.  CT 02/14/2022. FINDINGS: 1250 hours. Stable mild patient rotation to the right. The heart size and mediastinal contours are stable. There are stable emphysematous changes throughout both lungs. No evidence of superimposed airspace disease, pleural effusion or pneumothorax. No acute osseous findings are evident. Telemetry leads overlie the chest. IMPRESSION: Stable examination. Emphysema without evidence of acute cardiopulmonary process. Electronically Signed   By: Richardean Sale M.D.   On: 02/19/2022 12:59  CT Angio Chest PE W and/or Wo Contrast  Result Date: 02/14/2022 CLINICAL DATA:  Shortness of breath EXAM: CT ANGIOGRAPHY CHEST WITH CONTRAST TECHNIQUE: Multidetector CT imaging of the chest was performed using the standard protocol during bolus administration of intravenous contrast. Multiplanar CT image reconstructions and MIPs were obtained to evaluate the vascular anatomy. RADIATION DOSE REDUCTION: This exam was performed according to the departmental dose-optimization program which includes automated exposure control, adjustment of the mA and/or kV according to patient size and/or use of iterative reconstruction technique. CONTRAST:  18m OMNIPAQUE IOHEXOL 350 MG/ML SOLN COMPARISON:  05/16/2019 FINDINGS: Cardiovascular: Contrast injection is sufficient to demonstrate satisfactory opacification of the pulmonary arteries to the segmental level. There is no pulmonary embolus or evidence of right heart strain. The size of the main pulmonary artery is normal. Heart size is normal, with no pericardial effusion. The course and caliber of the aorta are normal. There is no atherosclerotic calcification. No acute aortic syndrome. Mediastinum/Nodes: No mediastinal, hilar or axillary lymphadenopathy. Normal visualized thyroid.  Thoracic esophageal course is normal. Lungs/Pleura: Airways are patent. There is emphysema. No pleural effusion, lobar consolidation, pneumothorax or pulmonary infarction. Upper Abdomen: Contrast bolus timing is not optimized for evaluation of the abdominal organs. The visualized portions of the organs of the upper abdomen are normal. Musculoskeletal: No chest wall abnormality. No bony spinal canal stenosis. Review of the MIP images confirms the above findings. IMPRESSION: 1. No pulmonary embolus or acute aortic syndrome. Aortic Atherosclerosis (ICD10-I70.0) and Emphysema (ICD10-J43.9). Electronically Signed   By: KUlyses JarredM.D.   On: 02/14/2022 02:40   DG Chest 2 View  Result Date: 02/13/2022 CLINICAL DATA:  Shortness of breath.  History COPD. EXAM: CHEST - 2 VIEW COMPARISON:  Chest CT 05/16/2019 FINDINGS: The lungs are mildly emphysematous but clear. The sulci are sharp. Stable mediastinum with mild aortic tortuosity and calcification. The heart is slightly enlarged with normal caliber central vasculature. There is osteopenia with mild-to-moderate thoracic kyphodextroscoliosis. No spinal compression fracture is seen. IMPRESSION: No evidence of acute chest disease. Emphysema. Aortic atherosclerosis. Osteopenia with thoracic kyphodextroscoliosis. Stable COPD chest. Electronically Signed   By: KTelford NabM.D.   On: 02/13/2022 22:03   MR KNEE RIGHT WO CONTRAST  Result Date: 02/04/2022 CLINICAL DATA:  Fall on December 07, 2021, persistent right knee pain since that time. EXAM: MRI OF THE RIGHT KNEE WITHOUT CONTRAST TECHNIQUE: Multiplanar, multisequence MR imaging of the knee was performed. No intravenous contrast was administered. COMPARISON:  None Available. FINDINGS: Despite efforts by the technologist and patient, motion artifact is present on today's exam and could not be eliminated. This reduces exam sensitivity and specificity. MENISCI Medial meniscus: Accentuated signal along the periphery of the  posterior horn for example on image 21 of series 9, difficult to interpret in the context of the underlying motion artifact. I am skeptical that this is a peripheral vertical tear although inspection of the time of arthroscopy may be warranted. Lateral meniscus: Radial tear of the posterior horn possibly with small flap component for example as shown on images 8 through 11 of series 12 and faintly appreciable on image 18 of series 11 and image 18 series 8. There is peripheral extrusion of the meniscus along with a diminutive posterolateral segment and possible secondary flap component extending cephalad from the posterolateral meniscus on images 8 through 10 of series 8, adjacent to the popliteus tendon. LIGAMENTS Cruciates:  Unremarkable Collaterals: Accentuated signal proximally in the fibular collateral ligament on images 14 through 15 of  series 7, compatible with grade 2 sprain. CARTILAGE Patellofemoral: Mild chondral thinning along the posterior patellar ridge. Medial:  Unremarkable Lateral: Cannot exclude a an osteochondral lesion along the posterolateral margin of the lateral femoral condyle on image 17 series 8. There is lateral compartmental articular space narrowing and some suspected subcortical sclerosis posterolaterally in lateral compartment on image 15 series 6. Joint:  Moderate knee joint effusion. Popliteal Fossa: Small Baker's cyst. Infiltrative edema the popliteal space. Extensor Mechanism:  Unremarkable Bones: Marrow edema laterally in the lateral femoral condyle, possibly at least partially related to the osteochondral lesion and underlying degenerative findings. Marrow edema in the medial tibial plateau, most confluent posteriorly, cause uncertain the although bone bruising or stress injury is a possibility. Other: Scattered subcutaneous edema around the knee. IMPRESSION: 1. Radial tear of the posterior horn lateral meniscus with suspected small flap component extending cephalad from the  posterolateral meniscus. 2. Accentuated signal along the periphery of the posterior horn medial meniscus, difficult to interpret in the context of the underlying motion artifact. I am skeptical that this is a peripheral vertical tear although inspection of the time of arthroscopy may be warranted. 3. Grade 2 sprain of the fibular collateral ligament proximally. 4. Marrow edema laterally in the lateral femoral condyle, possibly at least partially related to the osteochondral lesion and underlying degenerative findings. 5. Marrow edema in the medial tibial plateau, most confluent posteriorly, cause uncertain although bone bruising or stress injury is a possibility. 6. Moderate knee joint effusion with small Baker's cyst. Infiltrative edema in the popliteal space. 7. Mild chondral thinning along the posterior patellar ridge. Electronically Signed   By: Van Clines M.D.   On: 02/04/2022 14:14    Microbiology: Results for orders placed or performed during the hospital encounter of 02/19/22  Resp panel by RT-PCR (RSV, Flu A&B, Covid) Anterior Nasal Swab     Status: None   Collection Time: 02/19/22 12:48 PM   Specimen: Anterior Nasal Swab  Result Value Ref Range Status   SARS Coronavirus 2 by RT PCR NEGATIVE NEGATIVE Final    Comment: (NOTE) SARS-CoV-2 target nucleic acids are NOT DETECTED.  The SARS-CoV-2 RNA is generally detectable in upper respiratory specimens during the acute phase of infection. The lowest concentration of SARS-CoV-2 viral copies this assay can detect is 138 copies/mL. A negative result does not preclude SARS-Cov-2 infection and should not be used as the sole basis for treatment or other patient management decisions. A negative result may occur with  improper specimen collection/handling, submission of specimen other than nasopharyngeal swab, presence of viral mutation(s) within the areas targeted by this assay, and inadequate number of viral copies(<138 copies/mL). A  negative result must be combined with clinical observations, patient history, and epidemiological information. The expected result is Negative.  Fact Sheet for Patients:  EntrepreneurPulse.com.au  Fact Sheet for Healthcare Providers:  IncredibleEmployment.be  This test is no t yet approved or cleared by the [place] FDA and  has been authorized for detection and/or diagnosis of SARS-CoV-2 by FDA under an Emergency Use Authorization (EUA). This EUA will remain  in effect (meaning this test can be used) for the duration of the COVID-19 declaration under Section 564(b)(1) of the Act, 21 U.S.C.section 360bbb-3(b)(1), unless the authorization is terminated  or revoked sooner.       Influenza A by PCR NEGATIVE NEGATIVE Final   Influenza B by PCR NEGATIVE NEGATIVE Final    Comment: (NOTE) The Xpert Xpress SARS-CoV-2/FLU/RSV plus assay is intended as an  aid in the diagnosis of influenza from Nasopharyngeal swab specimens and should not be used as a sole basis for treatment. Nasal washings and aspirates are unacceptable for Xpert Xpress SARS-CoV-2/FLU/RSV testing.  Fact Sheet for Patients: EntrepreneurPulse.com.au  Fact Sheet for Healthcare Providers: IncredibleEmployment.be  This test is not yet approved or cleared by the [place] FDA and has been authorized for detection and/or diagnosis of SARS-CoV-2 by FDA under an Emergency Use Authorization (EUA). This EUA will remain in effect (meaning this test can be used) for the duration of the COVID-19 declaration under Section 564(b)(1) of the Act, 21 U.S.C. section 360bbb-3(b)(1), unless the authorization is terminated or revoked.     Resp Syncytial Virus by PCR NEGATIVE NEGATIVE Final    Comment: (NOTE) Fact Sheet for Patients: EntrepreneurPulse.com.au  Fact Sheet for Healthcare  Providers: IncredibleEmployment.be  This test is not yet approved or cleared by the [place] FDA and has been authorized for detection and/or diagnosis of SARS-CoV-2 by FDA under an Emergency Use Authorization (EUA). This EUA will remain in effect (meaning this test can be used) for the duration of the COVID-19 declaration under Section 564(b)(1) of the Act, 21 U.S.C. section 360bbb-3(b)(1), unless the authorization is terminated or revoked.  Performed at [facility], [address] [place], [place] [address]   Blood culture (routine x 2)     Status: None (Preliminary result)   Collection Time: 02/19/22 12:48 PM   Specimen: BLOOD  Result Value Ref Range Status   Specimen Description BLOOD BLOOD RIGHT ARM  Final   Special Requests   Final    BOTTLES DRAWN AEROBIC AND ANAEROBIC Blood Culture adequate volume   Culture   Final    NO GROWTH 2 DAYS Performed at [facility], [address], [place], [place] [address]    Report Status PENDING  Incomplete  Blood culture (routine x 2)     Status: None (Preliminary result)   Collection Time: 02/19/22 12:48 PM   Specimen: BLOOD  Result Value Ref Range Status   Specimen Description BLOOD BLOOD LEFT ARM  Final   Special Requests   Final    BOTTLES DRAWN AEROBIC AND ANAEROBIC Blood Culture results may not be optimal due to an inadequate volume of blood received in culture bottles   Culture   Final    NO GROWTH 2 DAYS Performed at [facility], [address] [place], [place] [address]    Report Status PENDING  Incomplete    Labs: CBC: Recent Labs  Lab 02/19/22 1248 02/20/22 0500  WBC 10.0 10.0  HGB 12.6 11.7*  HCT 42.1 38.4  MCV 104.0* 101.9*  PLT 219 782   Basic Metabolic Panel: Recent Labs  Lab 02/19/22 1248 02/20/22 0500  NA 140 139  K 4.5 4.6  CL 93* 95*  CO2 39* 36*  GLUCOSE 129* 104*  BUN 21 26*  CREATININE 0.79 0.87  CALCIUM 9.5 9.2   Liver Function  Tests: Recent Labs  Lab 02/19/22 1248  AST 20  ALT 50*  ALKPHOS 122  BILITOT 0.7  PROT 7.0  ALBUMIN 3.7   CBG: No results for input(s): "GLUCAP" in the last 168 hours.  Discharge time spent: greater than 30 minutes.  Signed: Jennye Boroughs, MD [facility] 02/21/2022

## 2022-02-21 NOTE — Progress Notes (Signed)
This Patient takes Pred Forte eye drops in the Left eye only and BID at current. Pt tapers further to once a day , one drop  in L eye starting the 23rd. Her husband has tried to get it changed in the [facility] numerous times. She has several types of eyedrops and they seem to have created significant confusion regarding how and when to administer them. Each type is in the middle of a treatment regimen that is not static. We have addressed this with Pharmacy, and they ask that a physician be the one to order it in appropriate measure. This note serves as a record of such an attempt.

## 2022-02-21 NOTE — Progress Notes (Signed)
Pt Spo2 goes down to 85% on room at rest. Spo2 94% on 2L.

## 2022-02-21 NOTE — Care Management Important Message (Signed)
Important Message  Patient Details  Name: Abigail Hill MRN: [id number] Date of Birth: 03-Jul-1950   Medicare Important Message Given:  N/A - LOS <3 / Initial given by admissions     [name] 02/21/2022, 8:23 AM

## 2022-02-21 NOTE — Progress Notes (Signed)
PULMONOLOGY         Date: 02/21/2022,   MRN# [id number] Abigail Hill Nov 04, 1950     AdmissionWeight: 70 kg                 CurrentWeight: 70 kg  Referring provider: Dr Mal Misty   CHIEF COMPLAINT:   Hypoxemic hypercapnic respiratory failure   HISTORY OF PRESENT ILLNESS   This is a very pleasant 71 yo F with hx of advanced COPD with chronic LE edema and CHF wheelchair bound status. She is with additional comorbid conditions including essential hypertension, recurrent UTI, chronic diastolic dysfunction CHF who presents to the ER by EMS for evaluation of mental status changes.  She was discharged home on 2 L of oxygen continuous which she has been using as recommended.  They had gone for follow-up appointment with her pulmonologist and on the 2 L she was noted to have pulse oximetry in the low 80s and so they were advised to increase her oxygen to 3 L which the husband states he did once they got home.  He states that he noticed increased lethargy and sleepiness after he got back from the doctor's office which he attributed to have been tired but on the day of admission he had difficulty waking up and when she did wake up she was very confused and this concerned him so he called EMS.  EMS initially came and patient was awake, alert and oriented and refused to come to the ER but husband had to call again due to patient's inability to stay awake and increased lethargy. PCCM consultation placed for lethargy with advanced COPD and hypercarbic respiratory failure.  02/21/22- patient is resting comfortably in bed on 2L/min [place].  I worked on moving all her supplies from [facility] to [facility] per request from patient. Her bloodwork is stable. She had TTE done with grade 1 diastolic heart failure. She is making adequate urine. Order has been placed for BIPAP device for home use. Patient is approaching baseline.    PAST MEDICAL HISTORY   Past Medical History:  Diagnosis Date   Abnormal gait  12/18/2015   BP (high blood pressure) 08/24/2015   Cancer ([place])    skin   Constriction of ureter (postoperative) 07/28/2015   Hydronephrosis 07/14/2015   Hypertension    Left foot drop 12/18/2015   Recurrent UTI    UPJ (ureteropelvic junction) obstruction 07/28/2015   UTI (lower urinary tract infection) 07/14/2015     SURGICAL HISTORY   Past Surgical History:  Procedure Laterality Date   broken foot  2012   COLONOSCOPY WITH PROPOFOL N/A 06/30/2017   Procedure: COLONOSCOPY WITH PROPOFOL;  Surgeon: Lollie Sails, MD;  Location: [facility] ENDOSCOPY;  Service: Endoscopy;  Laterality: N/A;   kidney repair  1985   repaired torn blood vessel in kidney     FAMILY HISTORY   Family History  Problem Relation Age of Onset   Kidney disease Neg Hx    Bladder Cancer Neg Hx      SOCIAL HISTORY   Social History   Tobacco Use   Smoking status: Former   Smokeless tobacco: Never   Tobacco comments:    quit 10 years  Vaping Use   Vaping Use: Never used  Substance Use Topics   Alcohol use: Never    Alcohol/week: 0.0 standard drinks of alcohol   Drug use: No     MEDICATIONS    Home Medication:     Current Medication:  Current Facility-Administered Medications:    0.9 %  sodium chloride infusion, 250 mL, Intravenous, PRN, Agbata, Tochukwu, MD   acetaminophen (TYLENOL) tablet 650 mg, 650 mg, Oral, Q6H PRN **OR** acetaminophen (TYLENOL) suppository 650 mg, 650 mg, Rectal, Q6H PRN, Agbata, Tochukwu, MD   aspirin chewable tablet 81 mg, 81 mg, Oral, Daily, Agbata, Tochukwu, MD, 81 mg at 02/21/22 0947   azithromycin (ZITHROMAX) tablet 250 mg, 250 mg, Oral, Daily, Agbata, Tochukwu, MD, 250 mg at 02/21/22 0947   calcium-vitamin D (OSCAL WITH D) 500-5 MG-MCG per tablet 1 tablet, 1 tablet, Oral, Daily, Agbata, Tochukwu, MD, 1 tablet at 02/21/22 0947   enoxaparin (LOVENOX) injection 40 mg, 40 mg, Subcutaneous, Q24H, Agbata, Tochukwu, MD, 40 mg at 02/20/22 2150   estradiol (ESTRACE) vaginal  cream 1 Applicatorful, 1 Applicatorful, Vaginal, Once per day on Mon Wed Fri, Agbata, Tochukwu, MD   guaiFENesin-dextromethorphan (ROBITUSSIN DM) 100-10 MG/5ML syrup 5 mL, 5 mL, Oral, Q4H PRN, Agbata, Tochukwu, MD, 5 mL at 02/20/22 2150   ipratropium-albuterol (DUONEB) 0.5-2.5 (3) MG/3ML nebulizer solution 3 mL, 3 mL, Nebulization, Q6H PRN, Agbata, Tochukwu, MD   ketorolac (ACULAR) 0.5 % ophthalmic solution 1 drop, 1 drop, Left Eye, QID, Agbata, Tochukwu, MD, 1 drop at 02/21/22 0948   mirabegron ER (MYRBETRIQ) tablet 50 mg, 50 mg, Oral, Daily, Agbata, Tochukwu, MD, 50 mg at 02/21/22 0947   ondansetron (ZOFRAN) tablet 4 mg, 4 mg, Oral, Q6H PRN **OR** ondansetron (ZOFRAN) injection 4 mg, 4 mg, Intravenous, Q6H PRN, Agbata, Tochukwu, MD   prednisoLONE acetate (PRED FORTE) 1 % ophthalmic suspension 1 drop, 1 drop, Left Eye, BID, Sharion Settler, NP, 1 drop at 02/21/22 0947   [START ON 02/22/2022] prednisoLONE acetate (PRED FORTE) 1 % ophthalmic suspension 1 drop, 1 drop, Left Eye, Daily, Sharion Settler, NP   sodium chloride flush (NS) 0.9 % injection 3 mL, 3 mL, Intravenous, Q12H, Agbata, Tochukwu, MD, 3 mL at 02/20/22 2151   sodium chloride flush (NS) 0.9 % injection 3 mL, 3 mL, Intravenous, PRN, Agbata, Tochukwu, MD   torsemide (DEMADEX) tablet 20 mg, 20 mg, Oral, Daily, Jennye Boroughs, MD, 20 mg at 02/21/22 4098    ALLERGIES   Oxycodone     REVIEW OF SYSTEMS    Review of Systems:  Gen:  Denies  fever, sweats, chills weigh loss  HEENT: Denies blurred vision, double vision, ear pain, eye pain, hearing loss, nose bleeds, sore throat Cardiac:  No dizziness, chest pain or heaviness, chest tightness,edema Resp:   reports dyspnea chronically  Gi: Denies swallowing difficulty, stomach pain, nausea or vomiting, diarrhea, constipation, bowel incontinence Gu:  Denies bladder incontinence, burning urine Ext:   Denies Joint pain, stiffness or swelling Skin: Denies  skin rash, easy bruising or  bleeding or hives Endoc:  Denies polyuria, polydipsia , polyphagia or weight change Psych:   Denies depression, insomnia or hallucinations   Other:  All other systems negative   VS: BP 130/73   Pulse 91   Temp 97.8 F (36.6 C)   Resp 18   Wt 70 kg   SpO2 96%   BMI 29.16 kg/m      PHYSICAL EXAM    GENERAL:NAD, no fevers, chills, no weakness no fatigue HEAD: Normocephalic, atraumatic.  EYES: Pupils equal, round, reactive to light. Extraocular muscles intact. No scleral icterus.  MOUTH: Moist mucosal membrane. Dentition intact. No abscess noted.  EAR, NOSE, THROAT: Clear without exudates. No external lesions.  NECK: Supple. No thyromegaly. No nodules. No JVD.  PULMONARY: decreased  breath sounds with mild rhonchi worse at bases bilaterally.  CARDIOVASCULAR: S1 and S2. Regular rate and rhythm. No murmurs, rubs, or gallops. No edema. Pedal pulses 2+ bilaterally.  GASTROINTESTINAL: Soft, nontender, nondistended. No masses. Positive bowel sounds. No hepatosplenomegaly.  MUSCULOSKELETAL: No swelling, clubbing, or edema. Range of motion full in all extremities.  NEUROLOGIC: Cranial nerves II through XII are intact. No gross focal neurological deficits. Sensation intact. Reflexes intact.  SKIN: No ulceration, lesions, rashes, or cyanosis. Skin warm and dry. Turgor intact.  PSYCHIATRIC: Mood, affect within normal limits. The patient is awake, alert and oriented x 3. Insight, judgment intact.       IMAGING   Reviewed CT chest from last week and CXR from yesterday.   ASSESSMENT/PLAN   Acute on chronic hypoxemic and hypercapnic respiratory failure  - patient likely has advancing COPD with worsening hypercapnia. She may need BIPAP-ST at home, ill place orders for this device.  -Patient requests to stop using [employer] and utilize [place] -reviewed ABG with hypercapnic respiratory failure  Chronic centrilobular emphysema  -continue generic COPD carepath with prn duoneb   Left  meniscal tear   Hindering ambulation and PT with resultant atlectasis, recommend IS          Thank you for allowing me to participate in the care of this patient.   Patient/Family are satisfied with care plan and all questions have been answered.    Provider disclosure: Patient with at least one acute or chronic illness or injury that poses a threat to life or bodily function and is being managed actively during this encounter.  All of the below services have been performed independently by signing provider:  review of prior documentation from internal and or external health records.  Review of previous and current lab results.  Interview and comprehensive assessment during patient visit today. Review of current and previous chest radiographs/CT scans. Discussion of management and test interpretation with health care team and patient/family.   This document was prepared using Dragon voice recognition software and may include unintentional dictation errors.     Ottie Glazier, M.D.  Division of Pulmonary & Critical Care Medicine

## 2022-02-24 LAB — CULTURE, BLOOD (ROUTINE X 2)
Culture: NO GROWTH
Culture: NO GROWTH
Special Requests: ADEQUATE

## 2022-02-27 DIAGNOSIS — J9612 Chronic respiratory failure with hypercapnia: Secondary | ICD-10-CM | POA: Diagnosis not present

## 2022-03-04 DIAGNOSIS — H353132 Nonexudative age-related macular degeneration, bilateral, intermediate dry stage: Secondary | ICD-10-CM | POA: Diagnosis not present

## 2022-03-04 DIAGNOSIS — H35722 Serous detachment of retinal pigment epithelium, left eye: Secondary | ICD-10-CM | POA: Diagnosis not present

## 2022-03-04 DIAGNOSIS — G4733 Obstructive sleep apnea (adult) (pediatric): Secondary | ICD-10-CM | POA: Diagnosis not present

## 2022-03-04 DIAGNOSIS — H353221 Exudative age-related macular degeneration, left eye, with active choroidal neovascularization: Secondary | ICD-10-CM | POA: Diagnosis not present

## 2022-03-05 NOTE — Progress Notes (Addendum)
Referring Physician:  Steffanie Rainwater, [place],  [place] [address]  Primary Physician:  Baxter Hire, MD  History of Present Illness: 03/07/2022 [name] has a history of skin CA, HTN, left foot drop, NPH, COPD, CHF, and osteoporosis.   She is on O2 3L via [place]- she has been on this for past 2 weeks since below hospital admission.   Referred by Dr. Karel Jarvis for balance/dexterity issues.   Was recently admitted on 02/19/22 for acute on chronic respiratory failure.   She notes frequent falling and balance issues x years that has been worse in last year. She notes some weakness in hands and has been dropping things. She notes neck pain that is worse with turning her head. She holds her head to the right side- feels like she has a "lean." No radicular arm pain. No numbness or tingling.   Bowel/Bladder Dysfunction: none  Conservative measures:  Physical therapy: HHPT summer/fall 2023 for mobility  Multimodal medical therapy including regular antiinflammatories: mobic  Injections: No epidural steroid injections  Past Surgery: no cervical surgery.   Abigail Hill has symptoms of cervical myelopathy with balance issues, frequent falling, and dexterity issues.   The symptoms are causing a significant impact on the patient's life.   Review of Systems:  A 10 point review of systems is negative, except for the pertinent positives and negatives detailed in the HPI.  Past Medical History: Past Medical History:  Diagnosis Date   Abnormal gait 12/18/2015   BP (high blood pressure) 08/24/2015   Cancer ([facility])    skin   Constriction of ureter (postoperative) 07/28/2015   Hydronephrosis 07/14/2015   Hypertension    Left foot drop 12/18/2015   Recurrent UTI    UPJ (ureteropelvic junction) obstruction 07/28/2015   UTI (lower urinary tract infection) 07/14/2015    Past Surgical History: Past Surgical History:  Procedure Laterality Date   broken foot  2012    COLONOSCOPY WITH PROPOFOL N/A 06/30/2017   Procedure: COLONOSCOPY WITH PROPOFOL;  Surgeon: Lollie Sails, MD;  Location[facility] ENDOSCOPY;  Service: Endoscopy;  Laterality: N/A;   kidney repair  1985   repaired torn blood vessel in kidney    Allergies: Allergies as of 03/07/2022 - Review Complete 02/19/2022  Allergen Reaction Noted   Oxycodone Nausea And Vomiting 04/14/2016    Medications: Outpatient Encounter Medications as of 03/07/2022  Medication Sig   alendronate (FOSAMAX) 70 MG tablet Take 70 mg by mouth every Monday.   aspirin 81 MG chewable tablet Chew 81 mg by mouth daily.   Calcium Carb-Cholecalciferol (OYSTER SHELL CALCIUM) 500-400 MG-UNIT TABS Take 1 tablet by mouth daily.   COMBIVENT RESPIMAT 20-100 MCG/ACT AERS respimat Inhale 1 puff into the lungs every 6 (six) hours as needed.   Cranberry 300 MG tablet Take 300 mg by mouth daily.   estradiol (ESTRACE) 0.1 MG/GM vaginal cream Place 1 Applicatorful vaginally at bedtime. Estrogen Cream Instruction Discard applicator Apply pea sized amount to tip of finger to urethra before bed. Wash hands well after application. Use everyday once a day for the first two weeks. Then 3 times a week.   guaiFENesin-dextromethorphan (ROBITUSSIN DM) 100-10 MG/5ML syrup Take 5 mLs by mouth every 4 (four) hours as needed for cough.   ketorolac (ACULAR) 0.5 % ophthalmic solution Place 1 drop into the left eye 4 (four) times daily.   MYRBETRIQ 50 MG TB24 tablet TAKE 1 TABLET BY MOUTH EVERY DAY (Patient taking differently: Take 50  mg by mouth daily.)   prednisoLONE acetate (PRED FORTE) 1 % ophthalmic suspension Place 1 drop into the left eye 4 (four) times daily.   spironolactone (ALDACTONE) 25 MG tablet Take 25 mg by mouth daily.   torsemide (DEMADEX) 20 MG tablet Take 10 mg by mouth daily.   No facility-administered encounter medications on file as of 03/07/2022.    Social History: Social History   Tobacco Use   Smoking status: Former    Smokeless tobacco: Never   Tobacco comments:    quit 10 years  Vaping Use   Vaping Use: Never used  Substance Use Topics   Alcohol use: Never    Alcohol/week: 0.0 standard drinks of alcohol   Drug use: No    Family Medical History: Family History  Problem Relation Age of Onset   Kidney disease Neg Hx    Bladder Cancer Neg Hx     Physical Examination: There were no vitals filed for this visit.  General: Patient is well developed, well nourished, calm, collected, and in no apparent distress. Attention to examination is appropriate.  Respiratory: Patient is breathing without any difficulty.   NEUROLOGICAL:     Awake, alert, oriented to person, place, and time.  Speech is clear and fluent. Fund of knowledge is appropriate.   Cranial Nerves: Pupils equal round and reactive to light.  Facial tone is symmetric.  Facial sensation is symmetric.  She is on O2 3L via [place].   She sits with neck slight flexed and almost resting on right shoulder.   She has no posterior cervical tenderness.   No abnormal lesions on exposed skin.   Strength: Side Biceps Triceps Deltoid Interossei Grip Wrist Ext. Wrist Flex.  R 5 5 +4 +'4 5 5 5  '$ L 5 +4 +4 +'4 5 5 5   '$ Side Iliopsoas Quads Hamstring PF DF EHL  R '5 5 5 5 5 5  '$ L '5 5 5 5 5 5   '$ Reflexes are 2+ and symmetric at the biceps, triceps, brachioradialis, patella and achilles.   Hoffman's is absent.  Clonus is not present.   Bilateral upper and lower extremity sensation is intact to light touch.     She is in a WC. Gait not tested.   Medical Decision Making  Imaging: No cervical imaging available.   Assessment and Plan: Abigail Hill is a pleasant 72 y.o. female with  frequent falling and balance issues x years that has been worse in last year. She notes some weakness in hands and has been dropping things. She notes neck pain that is worse with turning her head. She holds her head slightly flexed and to the right. No radicular am pain.   No  cervical imaging is available, however her symptoms and presentation are very suspicious for cervical myelopathy.   Treatment options discussed with patient and following plan made:   - MRI of cervical spine to evaluate for cervical stenosis.  - May need to get cervical and/or scoliosis xrays at some point.  - Will review MRI results and then contact her with follow up.   I spent a total of 25 minutes in face-to-face and non-face-to-face activities related to this patient's care today including review of outside records, review of imaging, review of symptoms, physical exam, discussion of differential diagnosis, discussion of treatment options, and documentation.   Thank you for involving me in the care of this patient.   ADDENDUM 03/21/22:  MRI of cervical spine dated 03/20/22:  FINDINGS: Intermittently  motion degraded examination (with up to moderate motion degradation of the acquired sequences). Within this limitation, findings are as follows.   Alignment: Reversal of the expected cervical lordosis. 3 mm C3-C4 grade 1 anterolisthesis. Slight C7-T1 grade 1 anterolisthesis   Vertebrae: Multilevel degenerative endplate irregularity. No significant marrow edema or focal suspicious osseous lesion.   Cord: No signal abnormality is identified within the cervical spinal cord.   Posterior Fossa, vertebral arteries, paraspinal tissues: No acute findings within included portions of the posterior fossa. Flow voids preserved within the imaged cervical vertebral arteries. No paraspinal mass or collection.   Disc levels:   Unless otherwise stated, the level by level findings below have not significantly changed from the prior MRI of 06/28/2017.   Multilevel disc degeneration, greatest at C4-C5 and C5-C6 (moderate to advanced at these levels).   C2-C3: Facet arthrosis.  No significant disc herniation or stenosis.   C3-C4: 3 mm grade 1 anterolisthesis, progressed. Disc bulge with bilateral  uncovertebral hypertrophy. Facet arthrosis (greater on the right and advanced on the right). As before, the disc bulge mildly effaces the ventral thecal sac and contacts the ventral aspect of the spinal cord. Mild-to-moderate right neural foraminal narrowing, unchanged.   C4-C5: Posterior disc osteophyte complex. Right-sided disc osteophyte ridge/uncinate hypertrophy. Uncinate hypertrophy on the left. Facet arthrosis. As before, the disc osteophyte complex mildly narrows the spinal canal and contacts the ventral aspect of the spinal cord. The mild to moderate right neural foraminal narrowing, unchanged.   C5-C6: Progressive posterior disc osteophyte complex with right-sided disc osteophyte ridge/uncinate hypertrophy. Facet arthrosis. Progressive mild spinal canal stenosis. The disc osteophyte complex contacts the ventral aspect of the spinal cord. Progressive moderate right neural foraminal narrowing.   C6-C7: Disc bulge with progressive right-sided disc osteophyte ridge/uncinate hypertrophy. Facet arthrosis. Mild spinal canal narrowing (without spinal cord mass effect). Mild right neural foraminal narrowing, progressed.   C7-T1: Slight grade 1 anterolisthesis. Facet arthrosis. No significant disc herniation or stenosis.   IMPRESSION: 1. Motion degraded examination. 2. Cervical spondylosis, as outlined and having progressed at multiple levels since the prior MRI of 06/28/2017. 3. Levels of mild spinal canal stenosis. Contact upon the ventral aspect of the spinal cord by disc osteophyte complexes or disc bulges at C3-C4, C4-C5 and C5-C6. 4. Multilevel foraminal stenosis, greatest on the right at C5-C6 (moderate at this site). 5. Disc degeneration is greatest at C4-C5 and C5-C6 (moderate-to-advanced at these levels). 6. Nonspecific reversal of the expected cervical lordosis. 7. 3 mm grade 1 anterolisthesis at C3-C4, progressed. 8. Slight grade 1 anterolisthesis at C7-T1, new.      Electronically Signed   By: Kellie Simmering D.O.   On: 03/21/2022 09:04    I have personally reviewed the images and agree with the above interpretation.   Will review above MRI with Dr. Izora Ribas and set up follow up once this is done.   Geronimo Boot PA-C Dept. of Neurosurgery

## 2022-03-07 ENCOUNTER — Encounter: Payer: Self-pay | Admitting: Orthopedic Surgery

## 2022-03-07 ENCOUNTER — Ambulatory Visit (INDEPENDENT_AMBULATORY_CARE_PROVIDER_SITE_OTHER): Payer: PPO | Admitting: Orthopedic Surgery

## 2022-03-07 VITALS — BP 130/78 | Ht 61.0 in | Wt 154.0 lb

## 2022-03-07 DIAGNOSIS — R29898 Other symptoms and signs involving the musculoskeletal system: Secondary | ICD-10-CM

## 2022-03-07 DIAGNOSIS — M542 Cervicalgia: Secondary | ICD-10-CM

## 2022-03-07 DIAGNOSIS — R2689 Other abnormalities of gait and mobility: Secondary | ICD-10-CM | POA: Diagnosis not present

## 2022-03-07 NOTE — Patient Instructions (Signed)
It was so nice to see you today.   I am worried that your frequent falling and issues with your hands are coming from your neck.   I want to get an MRI of your neck to look into this further. We will get this approved and the hospital will call you to schedule this (can do at [facility], the outpatient imaging center, or [facility]).   Once I have the results of the MRI, we will contact you regarding a follow up.   Please do not hesitate to call if you have any questions or concerns. You can also message me in [place].   If you have not heard back about any of the tests/procedures in the next week, please call the office so we can help you get these things scheduled.   Geronimo Boot PA-C [phone]

## 2022-03-13 DIAGNOSIS — M1711 Unilateral primary osteoarthritis, right knee: Secondary | ICD-10-CM | POA: Diagnosis not present

## 2022-03-18 DIAGNOSIS — M1711 Unilateral primary osteoarthritis, right knee: Secondary | ICD-10-CM | POA: Diagnosis not present

## 2022-03-20 ENCOUNTER — Ambulatory Visit
Admission: RE | Admit: 2022-03-20 | Discharge: 2022-03-20 | Disposition: A | Discharge status: Home / Self Care | Payer: PPO | Source: Ambulatory Visit | Attending: Orthopedic Surgery | Admitting: Orthopedic Surgery

## 2022-03-20 DIAGNOSIS — M4312 Spondylolisthesis, cervical region: Secondary | ICD-10-CM | POA: Diagnosis not present

## 2022-03-20 DIAGNOSIS — M542 Cervicalgia: Secondary | ICD-10-CM | POA: Diagnosis not present

## 2022-03-20 DIAGNOSIS — R2689 Other abnormalities of gait and mobility: Secondary | ICD-10-CM | POA: Insufficient documentation

## 2022-03-20 DIAGNOSIS — M47812 Spondylosis without myelopathy or radiculopathy, cervical region: Secondary | ICD-10-CM | POA: Diagnosis not present

## 2022-03-20 DIAGNOSIS — R29898 Other symptoms and signs involving the musculoskeletal system: Secondary | ICD-10-CM | POA: Diagnosis not present

## 2022-03-24 ENCOUNTER — Telehealth: Payer: Self-pay | Admitting: Orthopedic Surgery

## 2022-03-24 DIAGNOSIS — J441 Chronic obstructive pulmonary disease with (acute) exacerbation: Secondary | ICD-10-CM | POA: Diagnosis not present

## 2022-03-24 DIAGNOSIS — J9621 Acute and chronic respiratory failure with hypoxia: Secondary | ICD-10-CM | POA: Diagnosis not present

## 2022-03-24 DIAGNOSIS — J449 Chronic obstructive pulmonary disease, unspecified: Secondary | ICD-10-CM | POA: Diagnosis not present

## 2022-03-24 NOTE — Progress Notes (Signed)
Telephone Visit- Progress Note: Referring Physician:  No referring provider defined for this encounter.  Primary Physician:  Baxter Hire, MD  This visit was performed via telephone.  Patient location: home Provider location: office  I spent a total of 15 minutes non-face-to-face activities for this visit on the date of this encounter including review of current clinical condition and response to treatment.    Patient has given verbal consent to this telephone visits and we reviewed the limitations of a telephone visit. Patient wishes to proceed.    Chief Complaint:  review cervical MRI results  History of Present Illness: JAZMAINE FUELLING is a 72 y.o. female has a history of skin CA, HTN, left foot drop, NPH, COPD, CHF, and osteoporosis.    She has been O2 3L via [place] for past 2 weeks since she was admitted on 02/19/22 for acute on chronic respiratory failure.   Last seen by me on 03/07/22 for frequent falling and balance issues x years that has been worse in last year. She notes some weakness in hands and has been dropping things. Neck pain is worse with turning her head and she holds her head slightly flexed and to the right.   No change in above symptoms. She has seen neurology in [place] 2-3 years ago.   Exam: No exam done as this was a telephone encounter.     Imaging: MRI of cervical spine dated 03/20/22:  FINDINGS: Intermittently motion degraded examination (with up to moderate motion degradation of the acquired sequences). Within this limitation, findings are as follows.   Alignment: Reversal of the expected cervical lordosis. 3 mm C3-C4 grade 1 anterolisthesis. Slight C7-T1 grade 1 anterolisthesis   Vertebrae: Multilevel degenerative endplate irregularity. No significant marrow edema or focal suspicious osseous lesion.   Cord: No signal abnormality is identified within the cervical spinal cord.   Posterior Fossa, vertebral arteries, paraspinal tissues: No  acute findings within included portions of the posterior fossa. Flow voids preserved within the imaged cervical vertebral arteries. No paraspinal mass or collection.   Disc levels:   Unless otherwise stated, the level by level findings below have not significantly changed from the prior MRI of 06/28/2017.   Multilevel disc degeneration, greatest at C4-C5 and C5-C6 (moderate to advanced at these levels).   C2-C3: Facet arthrosis.  No significant disc herniation or stenosis.   C3-C4: 3 mm grade 1 anterolisthesis, progressed. Disc bulge with bilateral uncovertebral hypertrophy. Facet arthrosis (greater on the right and advanced on the right). As before, the disc bulge mildly effaces the ventral thecal sac and contacts the ventral aspect of the spinal cord. Mild-to-moderate right neural foraminal narrowing, unchanged.   C4-C5: Posterior disc osteophyte complex. Right-sided disc osteophyte ridge/uncinate hypertrophy. Uncinate hypertrophy on the left. Facet arthrosis. As before, the disc osteophyte complex mildly narrows the spinal canal and contacts the ventral aspect of the spinal cord. The mild to moderate right neural foraminal narrowing, unchanged.   C5-C6: Progressive posterior disc osteophyte complex with right-sided disc osteophyte ridge/uncinate hypertrophy. Facet arthrosis. Progressive mild spinal canal stenosis. The disc osteophyte complex contacts the ventral aspect of the spinal cord. Progressive moderate right neural foraminal narrowing.   C6-C7: Disc bulge with progressive right-sided disc osteophyte ridge/uncinate hypertrophy. Facet arthrosis. Mild spinal canal narrowing (without spinal cord mass effect). Mild right neural foraminal narrowing, progressed.   C7-T1: Slight grade 1 anterolisthesis. Facet arthrosis. No significant disc herniation or stenosis.   IMPRESSION: 1. Motion degraded examination. 2. Cervical spondylosis, as outlined  and having progressed  at multiple levels since the prior MRI of 06/28/2017. 3. Levels of mild spinal canal stenosis. Contact upon the ventral aspect of the spinal cord by disc osteophyte complexes or disc bulges at C3-C4, C4-C5 and C5-C6. 4. Multilevel foraminal stenosis, greatest on the right at C5-C6 (moderate at this site). 5. Disc degeneration is greatest at C4-C5 and C5-C6 (moderate-to-advanced at these levels). 6. Nonspecific reversal of the expected cervical lordosis. 7. 3 mm grade 1 anterolisthesis at C3-C4, progressed. 8. Slight grade 1 anterolisthesis at C7-T1, new.     Electronically Signed   By: Kellie Simmering D.O.   On: 03/21/2022 09:04     I have personally reviewed the images and agree with the above interpretation.  Above MRI reviewed with Dr. Izora Ribas as well.   Assessment and Plan: Ms. Rocchio is a pleasant 72 y.o. female with  frequent falling and balance issues x years that has been worse in last year. She notes some weakness in hands and has been dropping things. She notes neck pain that is worse with turning her head. She holds her head slightly flexed and to the right. No radicular am pain.    She has known severe cervical spondylosis with slip at C3-4 and C7-T1. Mild central stenosis seen on MRI. Dr. Izora Ribas was not convinced her balance/falling is cervical mediated.    Treatment options discussed with Dr. Izora Ribas. Following plan made with patient:    - Cervical xrays with flexion/extension ordered to further assess slips at C3-C4 and C7-T1.  Will call her with results.  - Referral to neurology for evaluation of balance issues/falling. She would like to be seen in [place]. Referral to [facility] Manuella Ghazi).   Geronimo Boot PA-C Neurosurgery

## 2022-03-24 NOTE — Telephone Encounter (Signed)
She confirmed for tomorrow at 8am 03/25/2022.

## 2022-03-24 NOTE — Telephone Encounter (Signed)
-----  Message from Meade Maw, MD sent at 03/24/2022  9:29 AM EST ----- I'd send her for neurology evaluation, and for C spine flex/ext films.  I doubt this is from stenosis.  Her prior MRI T spine from 2021 is pretty clean.  Ardyth Gal  ----- Message ----- From: Geronimo Boot, PA-C Sent: 03/21/2022   7:52 PM EST To: Meade Maw, MD  Abigail Hill has frequent falling and balance issues x years that has been worse in last year. She notes some weakness in hands and has been dropping things. She notes neck pain that is worse with turning her head. No radicular am pain.   She sits with neck slight flexed and almost resting on right shoulder. She has 4+ weakness in left triceps, deltoid, interossei and right deltoid interossei.   MRI shows some central stenosis, but not as severe as I expected. Would you recommend a thoracic MRI?   Thanks!

## 2022-03-24 NOTE — Telephone Encounter (Signed)
Please call and set her up with a phone visit with me to review her cervical MRI.

## 2022-03-25 ENCOUNTER — Encounter: Payer: Self-pay | Admitting: Orthopedic Surgery

## 2022-03-25 ENCOUNTER — Ambulatory Visit (INDEPENDENT_AMBULATORY_CARE_PROVIDER_SITE_OTHER): Payer: PPO | Admitting: Orthopedic Surgery

## 2022-03-25 DIAGNOSIS — M47812 Spondylosis without myelopathy or radiculopathy, cervical region: Secondary | ICD-10-CM

## 2022-03-25 DIAGNOSIS — M1711 Unilateral primary osteoarthritis, right knee: Secondary | ICD-10-CM | POA: Diagnosis not present

## 2022-03-25 DIAGNOSIS — R29898 Other symptoms and signs involving the musculoskeletal system: Secondary | ICD-10-CM

## 2022-03-25 DIAGNOSIS — R2689 Other abnormalities of gait and mobility: Secondary | ICD-10-CM

## 2022-03-25 DIAGNOSIS — M4312 Spondylolisthesis, cervical region: Secondary | ICD-10-CM | POA: Diagnosis not present

## 2022-03-27 DIAGNOSIS — M1711 Unilateral primary osteoarthritis, right knee: Secondary | ICD-10-CM | POA: Diagnosis not present

## 2022-03-30 DIAGNOSIS — J9612 Chronic respiratory failure with hypercapnia: Secondary | ICD-10-CM | POA: Diagnosis not present

## 2022-04-02 ENCOUNTER — Ambulatory Visit
Admission: RE | Admit: 2022-04-02 | Discharge: 2022-04-02 | Disposition: A | Discharge status: Home / Self Care | Payer: PPO | Source: Ambulatory Visit | Attending: Internal Medicine | Admitting: Internal Medicine

## 2022-04-02 DIAGNOSIS — M1711 Unilateral primary osteoarthritis, right knee: Secondary | ICD-10-CM | POA: Diagnosis not present

## 2022-04-02 DIAGNOSIS — Z1231 Encounter for screening mammogram for malignant neoplasm of breast: Secondary | ICD-10-CM | POA: Diagnosis not present

## 2022-04-04 DIAGNOSIS — M1711 Unilateral primary osteoarthritis, right knee: Secondary | ICD-10-CM | POA: Diagnosis not present

## 2022-04-08 DIAGNOSIS — H353221 Exudative age-related macular degeneration, left eye, with active choroidal neovascularization: Secondary | ICD-10-CM | POA: Diagnosis not present

## 2022-04-08 DIAGNOSIS — M1711 Unilateral primary osteoarthritis, right knee: Secondary | ICD-10-CM | POA: Diagnosis not present

## 2022-04-08 DIAGNOSIS — H35722 Serous detachment of retinal pigment epithelium, left eye: Secondary | ICD-10-CM | POA: Diagnosis not present

## 2022-04-08 DIAGNOSIS — H353132 Nonexudative age-related macular degeneration, bilateral, intermediate dry stage: Secondary | ICD-10-CM | POA: Diagnosis not present

## 2022-04-10 ENCOUNTER — Other Ambulatory Visit: Payer: Self-pay | Admitting: Internal Medicine

## 2022-04-10 DIAGNOSIS — N6489 Other specified disorders of breast: Secondary | ICD-10-CM

## 2022-04-10 DIAGNOSIS — R928 Other abnormal and inconclusive findings on diagnostic imaging of breast: Secondary | ICD-10-CM

## 2022-04-10 DIAGNOSIS — M1711 Unilateral primary osteoarthritis, right knee: Secondary | ICD-10-CM | POA: Diagnosis not present

## 2022-04-15 ENCOUNTER — Ambulatory Visit
Admission: RE | Admit: 2022-04-15 | Discharge: 2022-04-15 | Disposition: A | Discharge status: Home / Self Care | Payer: PPO | Source: Ambulatory Visit | Attending: Internal Medicine | Admitting: Internal Medicine

## 2022-04-15 DIAGNOSIS — M1711 Unilateral primary osteoarthritis, right knee: Secondary | ICD-10-CM | POA: Diagnosis not present

## 2022-04-15 DIAGNOSIS — R928 Other abnormal and inconclusive findings on diagnostic imaging of breast: Secondary | ICD-10-CM

## 2022-04-15 DIAGNOSIS — N6489 Other specified disorders of breast: Secondary | ICD-10-CM

## 2022-04-17 DIAGNOSIS — M1711 Unilateral primary osteoarthritis, right knee: Secondary | ICD-10-CM | POA: Diagnosis not present

## 2022-04-22 DIAGNOSIS — M1711 Unilateral primary osteoarthritis, right knee: Secondary | ICD-10-CM | POA: Diagnosis not present

## 2022-04-24 DIAGNOSIS — J449 Chronic obstructive pulmonary disease, unspecified: Secondary | ICD-10-CM | POA: Diagnosis not present

## 2022-04-24 DIAGNOSIS — J9621 Acute and chronic respiratory failure with hypoxia: Secondary | ICD-10-CM | POA: Diagnosis not present

## 2022-04-25 DIAGNOSIS — M1711 Unilateral primary osteoarthritis, right knee: Secondary | ICD-10-CM | POA: Diagnosis not present

## 2022-04-29 DIAGNOSIS — M1711 Unilateral primary osteoarthritis, right knee: Secondary | ICD-10-CM | POA: Diagnosis not present

## 2022-04-30 DIAGNOSIS — J9612 Chronic respiratory failure with hypercapnia: Secondary | ICD-10-CM | POA: Diagnosis not present

## 2022-05-06 DIAGNOSIS — M1711 Unilateral primary osteoarthritis, right knee: Secondary | ICD-10-CM | POA: Diagnosis not present

## 2022-05-08 DIAGNOSIS — M1711 Unilateral primary osteoarthritis, right knee: Secondary | ICD-10-CM | POA: Diagnosis not present

## 2022-05-13 DIAGNOSIS — H353221 Exudative age-related macular degeneration, left eye, with active choroidal neovascularization: Secondary | ICD-10-CM | POA: Diagnosis not present

## 2022-05-13 DIAGNOSIS — H35722 Serous detachment of retinal pigment epithelium, left eye: Secondary | ICD-10-CM | POA: Diagnosis not present

## 2022-05-13 DIAGNOSIS — N1832 Chronic kidney disease, stage 3b: Secondary | ICD-10-CM | POA: Diagnosis not present

## 2022-05-13 DIAGNOSIS — G4733 Obstructive sleep apnea (adult) (pediatric): Secondary | ICD-10-CM | POA: Diagnosis not present

## 2022-05-13 DIAGNOSIS — H353132 Nonexudative age-related macular degeneration, bilateral, intermediate dry stage: Secondary | ICD-10-CM | POA: Diagnosis not present

## 2022-05-20 DIAGNOSIS — M1711 Unilateral primary osteoarthritis, right knee: Secondary | ICD-10-CM | POA: Diagnosis not present

## 2022-05-20 DIAGNOSIS — J9611 Chronic respiratory failure with hypoxia: Secondary | ICD-10-CM | POA: Diagnosis not present

## 2022-05-20 DIAGNOSIS — I5032 Chronic diastolic (congestive) heart failure: Secondary | ICD-10-CM | POA: Diagnosis not present

## 2022-05-20 DIAGNOSIS — I10 Essential (primary) hypertension: Secondary | ICD-10-CM | POA: Diagnosis not present

## 2022-05-20 DIAGNOSIS — H353221 Exudative age-related macular degeneration, left eye, with active choroidal neovascularization: Secondary | ICD-10-CM | POA: Diagnosis not present

## 2022-05-20 DIAGNOSIS — J432 Centrilobular emphysema: Secondary | ICD-10-CM | POA: Diagnosis not present

## 2022-05-20 DIAGNOSIS — Z0000 Encounter for general adult medical examination without abnormal findings: Secondary | ICD-10-CM | POA: Diagnosis not present

## 2022-05-20 DIAGNOSIS — G912 (Idiopathic) normal pressure hydrocephalus: Secondary | ICD-10-CM | POA: Diagnosis not present

## 2022-05-20 DIAGNOSIS — N1832 Chronic kidney disease, stage 3b: Secondary | ICD-10-CM | POA: Diagnosis not present

## 2022-05-22 DIAGNOSIS — M1711 Unilateral primary osteoarthritis, right knee: Secondary | ICD-10-CM | POA: Diagnosis not present

## 2022-05-23 DIAGNOSIS — J449 Chronic obstructive pulmonary disease, unspecified: Secondary | ICD-10-CM | POA: Diagnosis not present

## 2022-05-23 DIAGNOSIS — J9621 Acute and chronic respiratory failure with hypoxia: Secondary | ICD-10-CM | POA: Diagnosis not present

## 2022-05-27 DIAGNOSIS — M1711 Unilateral primary osteoarthritis, right knee: Secondary | ICD-10-CM | POA: Diagnosis not present

## 2022-05-29 DIAGNOSIS — J9612 Chronic respiratory failure with hypercapnia: Secondary | ICD-10-CM | POA: Diagnosis not present

## 2022-05-29 DIAGNOSIS — M1711 Unilateral primary osteoarthritis, right knee: Secondary | ICD-10-CM | POA: Diagnosis not present

## 2022-06-03 DIAGNOSIS — M1711 Unilateral primary osteoarthritis, right knee: Secondary | ICD-10-CM | POA: Diagnosis not present

## 2022-06-05 DIAGNOSIS — M1711 Unilateral primary osteoarthritis, right knee: Secondary | ICD-10-CM | POA: Diagnosis not present

## 2022-06-10 DIAGNOSIS — M1711 Unilateral primary osteoarthritis, right knee: Secondary | ICD-10-CM | POA: Diagnosis not present

## 2022-06-12 DIAGNOSIS — M1711 Unilateral primary osteoarthritis, right knee: Secondary | ICD-10-CM | POA: Diagnosis not present

## 2022-06-16 DIAGNOSIS — G912 (Idiopathic) normal pressure hydrocephalus: Secondary | ICD-10-CM | POA: Diagnosis not present

## 2022-06-16 DIAGNOSIS — G939 Disorder of brain, unspecified: Secondary | ICD-10-CM | POA: Diagnosis not present

## 2022-06-16 DIAGNOSIS — G4733 Obstructive sleep apnea (adult) (pediatric): Secondary | ICD-10-CM | POA: Diagnosis not present

## 2022-06-16 DIAGNOSIS — R296 Repeated falls: Secondary | ICD-10-CM | POA: Diagnosis not present

## 2022-06-16 DIAGNOSIS — J9611 Chronic respiratory failure with hypoxia: Secondary | ICD-10-CM | POA: Diagnosis not present

## 2022-06-16 DIAGNOSIS — R2689 Other abnormalities of gait and mobility: Secondary | ICD-10-CM | POA: Diagnosis not present

## 2022-06-17 DIAGNOSIS — H353221 Exudative age-related macular degeneration, left eye, with active choroidal neovascularization: Secondary | ICD-10-CM | POA: Diagnosis not present

## 2022-06-17 DIAGNOSIS — H35722 Serous detachment of retinal pigment epithelium, left eye: Secondary | ICD-10-CM | POA: Diagnosis not present

## 2022-06-17 DIAGNOSIS — H353132 Nonexudative age-related macular degeneration, bilateral, intermediate dry stage: Secondary | ICD-10-CM | POA: Diagnosis not present

## 2022-06-17 DIAGNOSIS — M1711 Unilateral primary osteoarthritis, right knee: Secondary | ICD-10-CM | POA: Diagnosis not present

## 2022-06-19 DIAGNOSIS — N3281 Overactive bladder: Secondary | ICD-10-CM | POA: Diagnosis not present

## 2022-06-19 DIAGNOSIS — M810 Age-related osteoporosis without current pathological fracture: Secondary | ICD-10-CM | POA: Diagnosis not present

## 2022-06-19 DIAGNOSIS — Z87891 Personal history of nicotine dependence: Secondary | ICD-10-CM | POA: Diagnosis not present

## 2022-06-19 DIAGNOSIS — H353221 Exudative age-related macular degeneration, left eye, with active choroidal neovascularization: Secondary | ICD-10-CM | POA: Diagnosis not present

## 2022-06-19 DIAGNOSIS — H259 Unspecified age-related cataract: Secondary | ICD-10-CM | POA: Diagnosis not present

## 2022-06-19 DIAGNOSIS — J9611 Chronic respiratory failure with hypoxia: Secondary | ICD-10-CM | POA: Diagnosis not present

## 2022-06-19 DIAGNOSIS — Z7982 Long term (current) use of aspirin: Secondary | ICD-10-CM | POA: Diagnosis not present

## 2022-06-19 DIAGNOSIS — G912 (Idiopathic) normal pressure hydrocephalus: Secondary | ICD-10-CM | POA: Diagnosis not present

## 2022-06-19 DIAGNOSIS — G4733 Obstructive sleep apnea (adult) (pediatric): Secondary | ICD-10-CM | POA: Diagnosis not present

## 2022-06-19 DIAGNOSIS — J441 Chronic obstructive pulmonary disease with (acute) exacerbation: Secondary | ICD-10-CM | POA: Diagnosis not present

## 2022-06-19 DIAGNOSIS — R32 Unspecified urinary incontinence: Secondary | ICD-10-CM | POA: Diagnosis not present

## 2022-06-23 ENCOUNTER — Other Ambulatory Visit: Payer: Self-pay | Admitting: Neurology

## 2022-06-23 DIAGNOSIS — J449 Chronic obstructive pulmonary disease, unspecified: Secondary | ICD-10-CM | POA: Diagnosis not present

## 2022-06-23 DIAGNOSIS — R2689 Other abnormalities of gait and mobility: Secondary | ICD-10-CM

## 2022-06-23 DIAGNOSIS — R296 Repeated falls: Secondary | ICD-10-CM

## 2022-06-23 DIAGNOSIS — J9621 Acute and chronic respiratory failure with hypoxia: Secondary | ICD-10-CM | POA: Diagnosis not present

## 2022-06-23 DIAGNOSIS — G912 (Idiopathic) normal pressure hydrocephalus: Secondary | ICD-10-CM

## 2022-06-24 DIAGNOSIS — M1711 Unilateral primary osteoarthritis, right knee: Secondary | ICD-10-CM | POA: Diagnosis not present

## 2022-06-25 ENCOUNTER — Ambulatory Visit
Admission: RE | Admit: 2022-06-25 | Discharge: 2022-06-25 | Disposition: A | Discharge status: Home / Self Care | Payer: PPO | Source: Ambulatory Visit | Attending: Neurology | Admitting: Neurology

## 2022-06-25 DIAGNOSIS — G912 (Idiopathic) normal pressure hydrocephalus: Secondary | ICD-10-CM | POA: Diagnosis not present

## 2022-06-25 DIAGNOSIS — R2689 Other abnormalities of gait and mobility: Secondary | ICD-10-CM | POA: Insufficient documentation

## 2022-06-25 DIAGNOSIS — G919 Hydrocephalus, unspecified: Secondary | ICD-10-CM | POA: Diagnosis not present

## 2022-06-25 DIAGNOSIS — R296 Repeated falls: Secondary | ICD-10-CM | POA: Insufficient documentation

## 2022-06-26 DIAGNOSIS — J449 Chronic obstructive pulmonary disease, unspecified: Secondary | ICD-10-CM | POA: Diagnosis not present

## 2022-06-26 DIAGNOSIS — J9611 Chronic respiratory failure with hypoxia: Secondary | ICD-10-CM | POA: Diagnosis not present

## 2022-06-29 DIAGNOSIS — J9612 Chronic respiratory failure with hypercapnia: Secondary | ICD-10-CM | POA: Diagnosis not present

## 2022-06-30 DIAGNOSIS — J449 Chronic obstructive pulmonary disease, unspecified: Secondary | ICD-10-CM | POA: Diagnosis not present

## 2022-07-22 DIAGNOSIS — H35722 Serous detachment of retinal pigment epithelium, left eye: Secondary | ICD-10-CM | POA: Diagnosis not present

## 2022-07-22 DIAGNOSIS — H353221 Exudative age-related macular degeneration, left eye, with active choroidal neovascularization: Secondary | ICD-10-CM | POA: Diagnosis not present

## 2022-07-22 DIAGNOSIS — H353132 Nonexudative age-related macular degeneration, bilateral, intermediate dry stage: Secondary | ICD-10-CM | POA: Diagnosis not present

## 2022-07-23 DIAGNOSIS — J449 Chronic obstructive pulmonary disease, unspecified: Secondary | ICD-10-CM | POA: Diagnosis not present

## 2022-07-23 DIAGNOSIS — J9621 Acute and chronic respiratory failure with hypoxia: Secondary | ICD-10-CM | POA: Diagnosis not present

## 2022-07-24 DIAGNOSIS — L218 Other seborrheic dermatitis: Secondary | ICD-10-CM | POA: Diagnosis not present

## 2022-07-24 DIAGNOSIS — Z08 Encounter for follow-up examination after completed treatment for malignant neoplasm: Secondary | ICD-10-CM | POA: Diagnosis not present

## 2022-07-24 DIAGNOSIS — D225 Melanocytic nevi of trunk: Secondary | ICD-10-CM | POA: Diagnosis not present

## 2022-07-24 DIAGNOSIS — D485 Neoplasm of uncertain behavior of skin: Secondary | ICD-10-CM | POA: Diagnosis not present

## 2022-07-24 DIAGNOSIS — L821 Other seborrheic keratosis: Secondary | ICD-10-CM | POA: Diagnosis not present

## 2022-07-24 DIAGNOSIS — Z85828 Personal history of other malignant neoplasm of skin: Secondary | ICD-10-CM | POA: Diagnosis not present

## 2022-07-24 DIAGNOSIS — D045 Carcinoma in situ of skin of trunk: Secondary | ICD-10-CM | POA: Diagnosis not present

## 2022-07-24 DIAGNOSIS — D2262 Melanocytic nevi of left upper limb, including shoulder: Secondary | ICD-10-CM | POA: Diagnosis not present

## 2022-07-24 DIAGNOSIS — L570 Actinic keratosis: Secondary | ICD-10-CM | POA: Diagnosis not present

## 2022-07-24 DIAGNOSIS — D2261 Melanocytic nevi of right upper limb, including shoulder: Secondary | ICD-10-CM | POA: Diagnosis not present

## 2022-07-29 DIAGNOSIS — J9612 Chronic respiratory failure with hypercapnia: Secondary | ICD-10-CM | POA: Diagnosis not present

## 2022-08-23 DIAGNOSIS — J449 Chronic obstructive pulmonary disease, unspecified: Secondary | ICD-10-CM | POA: Diagnosis not present

## 2022-08-23 DIAGNOSIS — J9621 Acute and chronic respiratory failure with hypoxia: Secondary | ICD-10-CM | POA: Diagnosis not present

## 2022-08-29 DIAGNOSIS — J9612 Chronic respiratory failure with hypercapnia: Secondary | ICD-10-CM | POA: Diagnosis not present

## 2022-09-02 DIAGNOSIS — H35722 Serous detachment of retinal pigment epithelium, left eye: Secondary | ICD-10-CM | POA: Diagnosis not present

## 2022-09-02 DIAGNOSIS — H353132 Nonexudative age-related macular degeneration, bilateral, intermediate dry stage: Secondary | ICD-10-CM | POA: Diagnosis not present

## 2022-09-02 DIAGNOSIS — H353221 Exudative age-related macular degeneration, left eye, with active choroidal neovascularization: Secondary | ICD-10-CM | POA: Diagnosis not present

## 2022-09-17 ENCOUNTER — Ambulatory Visit: Payer: PPO | Admitting: Urology

## 2022-09-17 DIAGNOSIS — N390 Urinary tract infection, site not specified: Secondary | ICD-10-CM

## 2022-09-17 DIAGNOSIS — Z87440 Personal history of urinary (tract) infections: Secondary | ICD-10-CM

## 2022-09-17 DIAGNOSIS — R339 Retention of urine, unspecified: Secondary | ICD-10-CM

## 2022-09-17 DIAGNOSIS — N3281 Overactive bladder: Secondary | ICD-10-CM | POA: Diagnosis not present

## 2022-09-17 DIAGNOSIS — N3941 Urge incontinence: Secondary | ICD-10-CM

## 2022-09-17 LAB — BLADDER SCAN AMB NON-IMAGING: Scan Result: 163

## 2022-09-17 MED ORDER — MIRABEGRON ER 50 MG PO TB24: 50.0000 mg | ORAL_TABLET | Freq: Every day | ORAL | 11 refills | Status: DC

## 2022-09-17 MED ORDER — ESTRADIOL 0.1 MG/GM VA CREA: 1.0000 | TOPICAL_CREAM | Freq: Every day | VAGINAL | 12 refills | Status: DC

## 2022-09-17 NOTE — Progress Notes (Signed)
Marcelle Overlie Plume,acting as a Neurosurgeon for Abigail Scotland, MD.,have documented all relevant documentation on the behalf of Abigail Scotland, MD,as directed by  Abigail Scotland, MD while in the presence of Abigail Scotland, MD.  09/17/2022 3:22 PM   Chip Boer 01-05-1951 [id number]  Referring provider: Gracelyn Nurse, MD [address] [place],  [place] [address]  Chief Complaint  Patient presents with   Recurrent UTI    HPI: 72 year-old female who returns today for a routine annual follow up.   She was last seen a year ago. She has a personal history of a chronic UPJ obstruction with severe hydronephrosis managed conservatively. She also has urge incontinence. She has also struggled with recurrent urinary tract infections, and was placed on estrogen 3 times a week, preventative cranberry tablets, and at one point she was on suppressive antibiotics, but is no longer. She is on chronic myrbetriq 50 mg for OAB.  It appears that since her last visit, she was admitted in 01/2022 with acute on chronic respiratory failure. She currently utilizes a CPAP at night.   She has not had a documented UTI in over a year.   Today, she reports that she has started to wear Depends due to her urge incontinence. She continues to take the myrbetriq and cranberry tablets. She also uses the estrogen cream on a regular basis. She is prescribed Lasix but skips her dose when going out to avoid frequent urination.   Her use of a walker for ambulation has increased recently.   Results for orders placed or performed in visit on 09/17/22  Bladder Scan (Post Void Residual) in office  Result Value Ref Range   Scan Result 163 ml     PMH: Past Medical History:  Diagnosis Date   Abnormal gait 12/18/2015   BP (high blood pressure) 08/24/2015   Cancer (HCC)    skin   Constriction of ureter (postoperative) 07/28/2015   Hydronephrosis 07/14/2015   Hypertension    Left foot drop 12/18/2015   Recurrent UTI    UPJ  (ureteropelvic junction) obstruction 07/28/2015   UTI (lower urinary tract infection) 07/14/2015    Surgical History: Past Surgical History:  Procedure Laterality Date   broken foot  2012   COLONOSCOPY WITH PROPOFOL N/A 06/30/2017   Procedure: COLONOSCOPY WITH PROPOFOL;  Surgeon: Christena Deem, MD;  [facility] ENDOSCOPY;  Service: Endoscopy;  Laterality: N/A;   kidney repair  1985   repaired torn blood vessel in kidney    Home Medications:  Allergies as of 09/17/2022       Reactions   Oxycodone Nausea And Vomiting        Medication List        Accurate as of September 17, 2022 11:59 PM. If you have any questions, ask your nurse or doctor.          STOP taking these medications    Combivent Respimat 20-100 MCG/ACT Aers respimat Generic drug: Ipratropium-Albuterol Stopped by: Abigail Hill   guaiFENesin-dextromethorphan 100-10 MG/5ML syrup Commonly known as: ROBITUSSIN DM Stopped by: Abigail Hill       TAKE these medications    alendronate 70 MG tablet Commonly known as: FOSAMAX Take 70 mg by mouth every Monday.   aspirin 81 MG chewable tablet Chew 81 mg by mouth daily.   Cranberry 300 MG tablet Take 300 mg by mouth daily.   estradiol 0.1 MG/GM vaginal cream Commonly known as: ESTRACE Place 1 Applicatorful vaginally at bedtime. Estrogen Cream  Instruction Discard applicator Apply pea sized amount to tip of finger to urethra before bed. Wash hands well after application. Use everyday once a day for the first two weeks. Then 3 times a week.   mirabegron ER 50 MG Tb24 tablet Commonly known as: Myrbetriq Take 1 tablet (50 mg total) by mouth daily.   Oyster Shell Calcium 500-400 MG-UNIT Tabs Take 1 tablet by mouth daily.   spironolactone 25 MG tablet Commonly known as: ALDACTONE Take 25 mg by mouth daily.   torsemide 20 MG tablet Commonly known as: DEMADEX Take 10 mg by mouth daily.        Allergies:  Allergies  Allergen Reactions    Oxycodone Nausea And Vomiting    Family History: Family History  Problem Relation Age of Onset   Kidney disease Neg Hx    Bladder Cancer Neg Hx     Social History:  reports that she has quit smoking. She has never used smokeless tobacco. She reports that she does not drink alcohol and does not use drugs.   Physical Exam: There were no vitals taken for this visit.  Constitutional:  Alert and oriented, No acute distress.  Accompanied by husband today.  In a wheelchair. HEENT: [place] AT, moist mucus membranes.  Trachea midline, no masses. Neurologic: Grossly intact, no focal deficits, moving all 4 extremities. Psychiatric: Normal mood and affect.  Assessment & Plan:    1. Overactive bladder - Continue myrbetriq 50 mg, doing well; refilled  2. Incomplete bladder emptying - Mildly elevated, but not worrisome  3. Recurrent UTI - No issues with this in the past year.  -continue estrogen cream as she is doing well on this  Return in about 1 year (around 09/17/2023) for routine annual follow up.  I have reviewed the above documentation for accuracy and completeness, and I agree with the above.   Abigail Scotland, MD  [facility] [address] [place], [place] [address] [phone]

## 2022-09-22 DIAGNOSIS — J449 Chronic obstructive pulmonary disease, unspecified: Secondary | ICD-10-CM | POA: Diagnosis not present

## 2022-09-22 DIAGNOSIS — J9621 Acute and chronic respiratory failure with hypoxia: Secondary | ICD-10-CM | POA: Diagnosis not present

## 2022-09-24 DIAGNOSIS — H31123 Diffuse secondary atrophy of choroid, bilateral: Secondary | ICD-10-CM | POA: Diagnosis not present

## 2022-09-24 DIAGNOSIS — H353131 Nonexudative age-related macular degeneration, bilateral, early dry stage: Secondary | ICD-10-CM | POA: Diagnosis not present

## 2022-09-24 DIAGNOSIS — Z961 Presence of intraocular lens: Secondary | ICD-10-CM | POA: Diagnosis not present

## 2022-09-25 DIAGNOSIS — D044 Carcinoma in situ of skin of scalp and neck: Secondary | ICD-10-CM | POA: Diagnosis not present

## 2022-09-25 DIAGNOSIS — D485 Neoplasm of uncertain behavior of skin: Secondary | ICD-10-CM | POA: Diagnosis not present

## 2022-09-25 DIAGNOSIS — D045 Carcinoma in situ of skin of trunk: Secondary | ICD-10-CM | POA: Diagnosis not present

## 2022-09-28 DIAGNOSIS — J9612 Chronic respiratory failure with hypercapnia: Secondary | ICD-10-CM | POA: Diagnosis not present

## 2022-10-13 DIAGNOSIS — J449 Chronic obstructive pulmonary disease, unspecified: Secondary | ICD-10-CM | POA: Diagnosis not present

## 2022-10-14 DIAGNOSIS — H35722 Serous detachment of retinal pigment epithelium, left eye: Secondary | ICD-10-CM | POA: Diagnosis not present

## 2022-10-14 DIAGNOSIS — H353221 Exudative age-related macular degeneration, left eye, with active choroidal neovascularization: Secondary | ICD-10-CM | POA: Diagnosis not present

## 2022-10-14 DIAGNOSIS — H353132 Nonexudative age-related macular degeneration, bilateral, intermediate dry stage: Secondary | ICD-10-CM | POA: Diagnosis not present

## 2022-10-16 DIAGNOSIS — R296 Repeated falls: Secondary | ICD-10-CM | POA: Diagnosis not present

## 2022-10-16 DIAGNOSIS — G912 (Idiopathic) normal pressure hydrocephalus: Secondary | ICD-10-CM | POA: Diagnosis not present

## 2022-10-16 DIAGNOSIS — R2689 Other abnormalities of gait and mobility: Secondary | ICD-10-CM | POA: Diagnosis not present

## 2022-10-16 DIAGNOSIS — G939 Disorder of brain, unspecified: Secondary | ICD-10-CM | POA: Diagnosis not present

## 2022-10-23 DIAGNOSIS — J9621 Acute and chronic respiratory failure with hypoxia: Secondary | ICD-10-CM | POA: Diagnosis not present

## 2022-10-23 DIAGNOSIS — J449 Chronic obstructive pulmonary disease, unspecified: Secondary | ICD-10-CM | POA: Diagnosis not present

## 2022-10-24 DIAGNOSIS — I5030 Unspecified diastolic (congestive) heart failure: Secondary | ICD-10-CM | POA: Diagnosis not present

## 2022-10-24 DIAGNOSIS — I739 Peripheral vascular disease, unspecified: Secondary | ICD-10-CM | POA: Diagnosis not present

## 2022-10-24 DIAGNOSIS — R233 Spontaneous ecchymoses: Secondary | ICD-10-CM | POA: Diagnosis not present

## 2022-10-24 DIAGNOSIS — Z515 Encounter for palliative care: Secondary | ICD-10-CM | POA: Diagnosis not present

## 2022-10-24 DIAGNOSIS — Z6827 Body mass index (BMI) 27.0-27.9, adult: Secondary | ICD-10-CM | POA: Diagnosis not present

## 2022-10-24 DIAGNOSIS — J449 Chronic obstructive pulmonary disease, unspecified: Secondary | ICD-10-CM | POA: Diagnosis not present

## 2022-10-24 DIAGNOSIS — H353221 Exudative age-related macular degeneration, left eye, with active choroidal neovascularization: Secondary | ICD-10-CM | POA: Diagnosis not present

## 2022-10-24 DIAGNOSIS — D692 Other nonthrombocytopenic purpura: Secondary | ICD-10-CM | POA: Diagnosis not present

## 2022-10-24 DIAGNOSIS — N1831 Chronic kidney disease, stage 3a: Secondary | ICD-10-CM | POA: Diagnosis not present

## 2022-10-24 DIAGNOSIS — Z9981 Dependence on supplemental oxygen: Secondary | ICD-10-CM | POA: Diagnosis not present

## 2022-10-29 DIAGNOSIS — J9612 Chronic respiratory failure with hypercapnia: Secondary | ICD-10-CM | POA: Diagnosis not present

## 2022-11-06 DIAGNOSIS — L578 Other skin changes due to chronic exposure to nonionizing radiation: Secondary | ICD-10-CM | POA: Diagnosis not present

## 2022-11-06 DIAGNOSIS — D044 Carcinoma in situ of skin of scalp and neck: Secondary | ICD-10-CM | POA: Diagnosis not present

## 2022-11-06 DIAGNOSIS — L814 Other melanin hyperpigmentation: Secondary | ICD-10-CM | POA: Diagnosis not present

## 2022-11-06 DIAGNOSIS — L570 Actinic keratosis: Secondary | ICD-10-CM | POA: Diagnosis not present

## 2022-11-20 DIAGNOSIS — I10 Essential (primary) hypertension: Secondary | ICD-10-CM | POA: Diagnosis not present

## 2022-11-23 DIAGNOSIS — J449 Chronic obstructive pulmonary disease, unspecified: Secondary | ICD-10-CM | POA: Diagnosis not present

## 2022-11-23 DIAGNOSIS — J9621 Acute and chronic respiratory failure with hypoxia: Secondary | ICD-10-CM | POA: Diagnosis not present

## 2022-11-27 DIAGNOSIS — N1832 Chronic kidney disease, stage 3b: Secondary | ICD-10-CM | POA: Diagnosis not present

## 2022-11-27 DIAGNOSIS — Z0001 Encounter for general adult medical examination with abnormal findings: Secondary | ICD-10-CM | POA: Diagnosis not present

## 2022-11-27 DIAGNOSIS — J432 Centrilobular emphysema: Secondary | ICD-10-CM | POA: Diagnosis not present

## 2022-11-27 DIAGNOSIS — I5032 Chronic diastolic (congestive) heart failure: Secondary | ICD-10-CM | POA: Diagnosis not present

## 2022-11-27 DIAGNOSIS — Z23 Encounter for immunization: Secondary | ICD-10-CM | POA: Diagnosis not present

## 2022-11-27 DIAGNOSIS — I10 Essential (primary) hypertension: Secondary | ICD-10-CM | POA: Diagnosis not present

## 2022-11-29 DIAGNOSIS — J9612 Chronic respiratory failure with hypercapnia: Secondary | ICD-10-CM | POA: Diagnosis not present

## 2022-12-02 DIAGNOSIS — H35722 Serous detachment of retinal pigment epithelium, left eye: Secondary | ICD-10-CM | POA: Diagnosis not present

## 2022-12-02 DIAGNOSIS — H353221 Exudative age-related macular degeneration, left eye, with active choroidal neovascularization: Secondary | ICD-10-CM | POA: Diagnosis not present

## 2022-12-02 DIAGNOSIS — H353132 Nonexudative age-related macular degeneration, bilateral, intermediate dry stage: Secondary | ICD-10-CM | POA: Diagnosis not present

## 2022-12-23 DIAGNOSIS — J9621 Acute and chronic respiratory failure with hypoxia: Secondary | ICD-10-CM | POA: Diagnosis not present

## 2022-12-23 DIAGNOSIS — J449 Chronic obstructive pulmonary disease, unspecified: Secondary | ICD-10-CM | POA: Diagnosis not present

## 2022-12-29 DIAGNOSIS — J9612 Chronic respiratory failure with hypercapnia: Secondary | ICD-10-CM | POA: Diagnosis not present

## 2023-01-01 DIAGNOSIS — I5030 Unspecified diastolic (congestive) heart failure: Secondary | ICD-10-CM | POA: Diagnosis not present

## 2023-01-01 DIAGNOSIS — N1832 Chronic kidney disease, stage 3b: Secondary | ICD-10-CM | POA: Diagnosis not present

## 2023-01-01 DIAGNOSIS — J449 Chronic obstructive pulmonary disease, unspecified: Secondary | ICD-10-CM | POA: Diagnosis not present

## 2023-01-01 DIAGNOSIS — R233 Spontaneous ecchymoses: Secondary | ICD-10-CM | POA: Diagnosis not present

## 2023-01-01 DIAGNOSIS — D692 Other nonthrombocytopenic purpura: Secondary | ICD-10-CM | POA: Diagnosis not present

## 2023-01-01 DIAGNOSIS — Z6827 Body mass index (BMI) 27.0-27.9, adult: Secondary | ICD-10-CM | POA: Diagnosis not present

## 2023-01-01 DIAGNOSIS — Z515 Encounter for palliative care: Secondary | ICD-10-CM | POA: Diagnosis not present

## 2023-01-01 DIAGNOSIS — Z7951 Long term (current) use of inhaled steroids: Secondary | ICD-10-CM | POA: Diagnosis not present

## 2023-01-05 DIAGNOSIS — L304 Erythema intertrigo: Secondary | ICD-10-CM | POA: Diagnosis not present

## 2023-01-05 DIAGNOSIS — L309 Dermatitis, unspecified: Secondary | ICD-10-CM | POA: Diagnosis not present

## 2023-01-08 DIAGNOSIS — Z85828 Personal history of other malignant neoplasm of skin: Secondary | ICD-10-CM | POA: Diagnosis not present

## 2023-01-23 DIAGNOSIS — J9621 Acute and chronic respiratory failure with hypoxia: Secondary | ICD-10-CM | POA: Diagnosis not present

## 2023-01-23 DIAGNOSIS — J449 Chronic obstructive pulmonary disease, unspecified: Secondary | ICD-10-CM | POA: Diagnosis not present

## 2023-01-27 DIAGNOSIS — H353221 Exudative age-related macular degeneration, left eye, with active choroidal neovascularization: Secondary | ICD-10-CM | POA: Diagnosis not present

## 2023-01-27 DIAGNOSIS — H35722 Serous detachment of retinal pigment epithelium, left eye: Secondary | ICD-10-CM | POA: Diagnosis not present

## 2023-01-27 DIAGNOSIS — H353132 Nonexudative age-related macular degeneration, bilateral, intermediate dry stage: Secondary | ICD-10-CM | POA: Diagnosis not present

## 2023-01-27 DIAGNOSIS — Z961 Presence of intraocular lens: Secondary | ICD-10-CM | POA: Diagnosis not present

## 2023-01-29 DIAGNOSIS — J9612 Chronic respiratory failure with hypercapnia: Secondary | ICD-10-CM | POA: Diagnosis not present

## 2023-02-22 DIAGNOSIS — J449 Chronic obstructive pulmonary disease, unspecified: Secondary | ICD-10-CM | POA: Diagnosis not present

## 2023-02-22 DIAGNOSIS — J9621 Acute and chronic respiratory failure with hypoxia: Secondary | ICD-10-CM | POA: Diagnosis not present

## 2023-03-16 DIAGNOSIS — Z08 Encounter for follow-up examination after completed treatment for malignant neoplasm: Secondary | ICD-10-CM | POA: Diagnosis not present

## 2023-03-16 DIAGNOSIS — D225 Melanocytic nevi of trunk: Secondary | ICD-10-CM | POA: Diagnosis not present

## 2023-03-16 DIAGNOSIS — D2262 Melanocytic nevi of left upper limb, including shoulder: Secondary | ICD-10-CM | POA: Diagnosis not present

## 2023-03-16 DIAGNOSIS — L821 Other seborrheic keratosis: Secondary | ICD-10-CM | POA: Diagnosis not present

## 2023-03-16 DIAGNOSIS — L570 Actinic keratosis: Secondary | ICD-10-CM | POA: Diagnosis not present

## 2023-03-16 DIAGNOSIS — D2261 Melanocytic nevi of right upper limb, including shoulder: Secondary | ICD-10-CM | POA: Diagnosis not present

## 2023-03-16 DIAGNOSIS — Z85828 Personal history of other malignant neoplasm of skin: Secondary | ICD-10-CM | POA: Diagnosis not present

## 2023-03-18 DIAGNOSIS — R2689 Other abnormalities of gait and mobility: Secondary | ICD-10-CM | POA: Diagnosis not present

## 2023-03-18 DIAGNOSIS — R4189 Other symptoms and signs involving cognitive functions and awareness: Secondary | ICD-10-CM | POA: Diagnosis not present

## 2023-03-18 DIAGNOSIS — G912 (Idiopathic) normal pressure hydrocephalus: Secondary | ICD-10-CM | POA: Diagnosis not present

## 2023-03-18 DIAGNOSIS — R296 Repeated falls: Secondary | ICD-10-CM | POA: Diagnosis not present

## 2023-03-23 DIAGNOSIS — M47812 Spondylosis without myelopathy or radiculopathy, cervical region: Secondary | ICD-10-CM | POA: Diagnosis not present

## 2023-03-23 DIAGNOSIS — G4733 Obstructive sleep apnea (adult) (pediatric): Secondary | ICD-10-CM | POA: Diagnosis not present

## 2023-03-23 DIAGNOSIS — Z85828 Personal history of other malignant neoplasm of skin: Secondary | ICD-10-CM | POA: Diagnosis not present

## 2023-03-23 DIAGNOSIS — G912 (Idiopathic) normal pressure hydrocephalus: Secondary | ICD-10-CM | POA: Diagnosis not present

## 2023-03-23 DIAGNOSIS — Z860100 Personal history of colon polyps, unspecified: Secondary | ICD-10-CM | POA: Diagnosis not present

## 2023-03-23 DIAGNOSIS — M5030 Other cervical disc degeneration, unspecified cervical region: Secondary | ICD-10-CM | POA: Diagnosis not present

## 2023-03-23 DIAGNOSIS — Z9981 Dependence on supplemental oxygen: Secondary | ICD-10-CM | POA: Diagnosis not present

## 2023-03-23 DIAGNOSIS — Z87891 Personal history of nicotine dependence: Secondary | ICD-10-CM | POA: Diagnosis not present

## 2023-03-23 DIAGNOSIS — I083 Combined rheumatic disorders of mitral, aortic and tricuspid valves: Secondary | ICD-10-CM | POA: Diagnosis not present

## 2023-03-23 DIAGNOSIS — I119 Hypertensive heart disease without heart failure: Secondary | ICD-10-CM | POA: Diagnosis not present

## 2023-03-23 DIAGNOSIS — Z7982 Long term (current) use of aspirin: Secondary | ICD-10-CM | POA: Diagnosis not present

## 2023-03-23 DIAGNOSIS — Z9181 History of falling: Secondary | ICD-10-CM | POA: Diagnosis not present

## 2023-03-23 DIAGNOSIS — Z7952 Long term (current) use of systemic steroids: Secondary | ICD-10-CM | POA: Diagnosis not present

## 2023-03-23 DIAGNOSIS — Z8673 Personal history of transient ischemic attack (TIA), and cerebral infarction without residual deficits: Secondary | ICD-10-CM | POA: Diagnosis not present

## 2023-03-23 DIAGNOSIS — N1330 Unspecified hydronephrosis: Secondary | ICD-10-CM | POA: Diagnosis not present

## 2023-03-23 DIAGNOSIS — J449 Chronic obstructive pulmonary disease, unspecified: Secondary | ICD-10-CM | POA: Diagnosis not present

## 2023-03-23 DIAGNOSIS — M4802 Spinal stenosis, cervical region: Secondary | ICD-10-CM | POA: Diagnosis not present

## 2023-03-23 DIAGNOSIS — M21372 Foot drop, left foot: Secondary | ICD-10-CM | POA: Diagnosis not present

## 2023-03-23 DIAGNOSIS — Z7983 Long term (current) use of bisphosphonates: Secondary | ICD-10-CM | POA: Diagnosis not present

## 2023-03-26 DIAGNOSIS — Z961 Presence of intraocular lens: Secondary | ICD-10-CM | POA: Diagnosis not present

## 2023-03-26 DIAGNOSIS — H5203 Hypermetropia, bilateral: Secondary | ICD-10-CM | POA: Diagnosis not present

## 2023-03-26 DIAGNOSIS — H524 Presbyopia: Secondary | ICD-10-CM | POA: Diagnosis not present

## 2023-03-26 DIAGNOSIS — H31123 Diffuse secondary atrophy of choroid, bilateral: Secondary | ICD-10-CM | POA: Diagnosis not present

## 2023-03-26 DIAGNOSIS — H353131 Nonexudative age-related macular degeneration, bilateral, early dry stage: Secondary | ICD-10-CM | POA: Diagnosis not present

## 2023-03-26 DIAGNOSIS — H52223 Regular astigmatism, bilateral: Secondary | ICD-10-CM | POA: Diagnosis not present

## 2023-03-31 DIAGNOSIS — H35722 Serous detachment of retinal pigment epithelium, left eye: Secondary | ICD-10-CM | POA: Diagnosis not present

## 2023-03-31 DIAGNOSIS — H353221 Exudative age-related macular degeneration, left eye, with active choroidal neovascularization: Secondary | ICD-10-CM | POA: Diagnosis not present

## 2023-03-31 DIAGNOSIS — H353132 Nonexudative age-related macular degeneration, bilateral, intermediate dry stage: Secondary | ICD-10-CM | POA: Diagnosis not present

## 2023-03-31 DIAGNOSIS — G4733 Obstructive sleep apnea (adult) (pediatric): Secondary | ICD-10-CM | POA: Diagnosis not present

## 2023-04-01 DIAGNOSIS — M4802 Spinal stenosis, cervical region: Secondary | ICD-10-CM | POA: Diagnosis not present

## 2023-04-01 DIAGNOSIS — G912 (Idiopathic) normal pressure hydrocephalus: Secondary | ICD-10-CM | POA: Diagnosis not present

## 2023-04-01 DIAGNOSIS — J449 Chronic obstructive pulmonary disease, unspecified: Secondary | ICD-10-CM | POA: Diagnosis not present

## 2023-04-01 DIAGNOSIS — M47812 Spondylosis without myelopathy or radiculopathy, cervical region: Secondary | ICD-10-CM | POA: Diagnosis not present

## 2023-04-01 DIAGNOSIS — I083 Combined rheumatic disorders of mitral, aortic and tricuspid valves: Secondary | ICD-10-CM | POA: Diagnosis not present

## 2023-04-01 DIAGNOSIS — I119 Hypertensive heart disease without heart failure: Secondary | ICD-10-CM | POA: Diagnosis not present

## 2023-04-01 DIAGNOSIS — M5030 Other cervical disc degeneration, unspecified cervical region: Secondary | ICD-10-CM | POA: Diagnosis not present

## 2023-04-16 DIAGNOSIS — R5381 Other malaise: Secondary | ICD-10-CM | POA: Diagnosis not present

## 2023-04-16 DIAGNOSIS — Z7185 Encounter for immunization safety counseling: Secondary | ICD-10-CM | POA: Diagnosis not present

## 2023-04-16 DIAGNOSIS — J449 Chronic obstructive pulmonary disease, unspecified: Secondary | ICD-10-CM | POA: Diagnosis not present

## 2023-04-16 DIAGNOSIS — J9611 Chronic respiratory failure with hypoxia: Secondary | ICD-10-CM | POA: Diagnosis not present

## 2023-04-16 DIAGNOSIS — Z719 Counseling, unspecified: Secondary | ICD-10-CM | POA: Diagnosis not present

## 2023-04-29 ENCOUNTER — Encounter: Payer: Self-pay | Admitting: Urology

## 2023-05-08 DIAGNOSIS — J9612 Chronic respiratory failure with hypercapnia: Secondary | ICD-10-CM | POA: Diagnosis not present

## 2023-05-12 DIAGNOSIS — J449 Chronic obstructive pulmonary disease, unspecified: Secondary | ICD-10-CM | POA: Diagnosis not present

## 2023-05-12 DIAGNOSIS — J9621 Acute and chronic respiratory failure with hypoxia: Secondary | ICD-10-CM | POA: Diagnosis not present

## 2023-05-18 NOTE — Progress Notes (Unsigned)
 05/19/2023 9:05 AM   Abigail Hill 06/25/1950 [id number]  Referring provider: Gracelyn Nurse, MD [address] [facility] [place],  [place] [address]  Urological history: 1. Chronic UPJ obstruction -RUS (2020) - severe left hydronephrosis similar to 2017 [place] and CT -serum creatinine (11/2022) - 1.3  -managed conservatively   2. Urge incontinence -contributing factors of age, GSM, HTN, COPD, normal pressure hydrocephalus, stroke, CKD, sleep apnea, diuretic and history of smoking -Myrbetriq 50 mg daily  3. OAB -contributing factors of age, GSM, HTN, COPD, normal pressure hydrocephalus, stroke, CKD, sleep apnea, diuretic and history of smoking -Myrbetriq 50 mg daily  4. rUTI's -contributing factors of age, GSM and incontinence -documented urine cultures over the last year  None -vaginal estrogen cream and cranberry tablets   5. Incomplete bladder emptying -contributing factors of age, stroke, GSM and normal pressure hydrocephalus  Chief Complaint  Patient presents with   Urinary Frequency   HPI: Abigail Hill is a 73 y.o. female who presents today for lump in groin area; has had some bleeding off and on.    Previous records reviewed.   On Friday, she saw patches of blood on the side of her incontinence pad.  She also felt a lump in her groin area.  This all resolved by Sunday.  Patient denies any modifying or aggravating factors.  Patient denies any recent UTI's, gross hematuria, dysuria or suprapubic/flank pain.  Patient denies any fevers, chills, nausea or vomiting.    PMH: Past Medical History:  Diagnosis Date   Abnormal gait 12/18/2015   BP (high blood pressure) 08/24/2015   Cancer (HCC)    skin   Constriction of ureter (postoperative) 07/28/2015   Hydronephrosis 07/14/2015   Hypertension    Left foot drop 12/18/2015   Recurrent UTI    UPJ (ureteropelvic junction) obstruction 07/28/2015   UTI (lower urinary tract infection) 07/14/2015    Surgical  History: Past Surgical History:  Procedure Laterality Date   broken foot  2012   COLONOSCOPY WITH PROPOFOL N/A 06/30/2017   Procedure: COLONOSCOPY WITH PROPOFOL;  Surgeon: Christena Deem, MD;  Location: [facility] ENDOSCOPY;  Service: Endoscopy;  Laterality: N/A;   kidney repair  1985   repaired torn blood vessel in kidney    Home Medications:  Allergies as of 05/19/2023       Reactions   Oxycodone Nausea And Vomiting        Medication List        Accurate as of May 19, 2023  9:05 AM. If you have any questions, ask your nurse or doctor.          alendronate 70 MG tablet Commonly known as: FOSAMAX Take 70 mg by mouth every Monday.   aspirin 81 MG chewable tablet Chew 81 mg by mouth daily.   Cranberry 300 MG tablet Take 300 mg by mouth daily.   estradiol 0.1 MG/GM vaginal cream Commonly known as: ESTRACE Place 1 Applicatorful vaginally at bedtime. Estrogen Cream Instruction Discard applicator Apply pea sized amount to tip of finger to urethra before bed. Wash hands well after application. Use everyday once a day for the first two weeks. Then 3 times a week.   mirabegron ER 50 MG Tb24 tablet Commonly known as: Myrbetriq Take 1 tablet (50 mg total) by mouth daily.   Oyster Shell Calcium 500-400 MG-UNIT Tabs Take 1 tablet by mouth daily.   predniSONE 2.5 MG tablet Commonly known as: DELTASONE Take by mouth.   spironolactone  25 MG tablet Commonly known as: ALDACTONE Take 25 mg by mouth daily.   torsemide 20 MG tablet Commonly known as: DEMADEX Take 10 mg by mouth daily.        Allergies:  Allergies  Allergen Reactions   Oxycodone Nausea And Vomiting    Family History: Family History  Problem Relation Age of Onset   Kidney disease Neg Hx    Bladder Cancer Neg Hx     Social History:  reports that she has quit smoking. She has never used smokeless tobacco. She reports that she does not drink alcohol and does not use drugs.  ROS: Pertinent ROS in  HPI  Physical Exam: BP 110/67   Pulse 92   Ht 5\' 2"  (1.575 m)   Wt 165 lb (74.8 kg)   BMI 30.18 kg/m   Constitutional:  Well nourished. Alert and oriented, No acute distress. HEENT: [place] AT, moist mucus membranes.  Trachea midline Cardiovascular: No clubbing, cyanosis, or edema. Respiratory: Normal respiratory effort, no increased work of breathing. GU: No CVA tenderness.  No bladder fullness or masses.  Recession of labia minora, dry, pale vulvar vaginal mucosa and loss of mucosal ridges and folds.  Normal urethral meatus, no lesions, no prolapse, no discharge.   No urethral masses, tenderness and/or tenderness. No bladder fullness, tenderness or masses. Pale vagina mucosa, fair estrogen effect, no discharge and no lesions.  No erythema, crepitus or fluctuant mass noted.   In the intertriginous area of the left groin, this is a small 5 mm x 3 mm occluded sebaceous cyst that appears to have drained.  It does not appear infected.  Anus and perineum are without rashes or lesions.    Neurologic: Grossly intact, no focal deficits, moving all 4 extremities. Psychiatric: Normal mood and affect.    Laboratory Data: Comprehensive Metabolic Panel (CMP) Order: [id number] Component Ref Range & Units 5 mo ago  Glucose 70 - 110 mg/dL 89  Sodium 284 - 132 mmol/L 140  Potassium 3.6 - 5.1 mmol/L 4.5  Chloride 97 - 109 mmol/L 98  Carbon Dioxide (CO2) 22.0 - 32.0 mmol/L 35.5 High   Urea Nitrogen (BUN) 7 - 25 mg/dL 35 High   Creatinine 0.6 - 1.1 mg/dL 1.3 High   Glomerular Filtration Rate (eGFR) >60 mL/min/1.73sq m 43 Low   Comment: CKD-EPI (2021) does not include patient's race in the calculation of eGFR.  Monitoring changes of plasma creatinine and eGFR over time is useful for monitoring kidney function.  Interpretive Ranges for eGFR (CKD-EPI 2021):  eGFR:       >60 mL/min/1.73 sq. m - Normal eGFR:       30-59 mL/min/1.73 sq. m - Moderately Decreased eGFR:       15-29 mL/min/1.73 sq. m  -  Severely Decreased eGFR:       < 15 mL/min/1.73 sq. m  - Kidney Failure   Note: These eGFR calculations do not apply in acute situations when eGFR is changing rapidly or patients on dialysis.  Calcium 8.7 - 10.3 mg/dL 9.8  AST 8 - 39 U/L 14  ALT 5 - 38 U/L 15  Alk Phos (alkaline Phosphatase) 34 - 104 U/L 73  Albumin 3.5 - 4.8 g/dL 4.3  Bilirubin, Total 0.3 - 1.2 mg/dL 0.5  Protein, Total 6.1 - 7.9 g/dL 6.9  A/G Ratio 1.0 - 5.0 gm/dL 1.7  Resulting Agency [facility]   Specimen Collected: 11/20/22 08:17   Performed by: [facility] Last  Resulted: 11/20/22 13:21  Received From: [facility]  Result Received: 04/30/23 08:36    tains abnormal data CBC w/auto Differential (5 Part) Order: [id number] Component Ref Range & Units 5 mo ago  WBC (White Blood Cell Count) 4.1 - 10.2 10^3/uL 6.1  RBC (Red Blood Cell Count) 4.04 - 5.48 10^6/uL 3.89 Low   Hemoglobin 12.0 - 15.0 gm/dL 46.9  Hematocrit 62.9 - 47.0 % 38.6  MCV (Mean Corpuscular Volume) 80.0 - 100.0 fl 99.2  MCH (Mean Corpuscular Hemoglobin) 27.0 - 31.2 pg 31.9 High   MCHC (Mean Corpuscular Hemoglobin Concentration) 32.0 - 36.0 gm/dL 52.8  Platelet Count 413 - 450 10^3/uL 226  RDW-CV (Red Cell Distribution Width) 11.6 - 14.8 % 13.4  MPV (Mean Platelet Volume) 9.4 - 12.4 fl 10.1  Neutrophils 1.50 - 7.80 10^3/uL 4.22  Lymphocytes 1.00 - 3.60 10^3/uL 1  Monocytes 0.00 - 1.50 10^3/uL 0.56  Eosinophils 0.00 - 0.55 10^3/uL 0.31  Basophils 0.00 - 0.09 10^3/uL 0.03  Neutrophil % 32.0 - 70.0 % 68.8  Lymphocyte % 10.0 - 50.0 % 16.3  Monocyte % 4.0 - 13.0 % 9.1  Eosinophil % 1.0 - 5.0 % 5  Basophil% 0.0 - 2.0 % 0.5  Immature Granulocyte % <=0.7 % 0.3  Immature Granulocyte Count <=0.06 10^3/L 0.02  Resulting Agency [facility]   Specimen Collected: 11/20/22 08:17   Performed by: [facility] Last Resulted: 11/20/22 15:00   Received From: [facility]  Result Received: 04/30/23 08:36  I have reviewed the labs.   Pertinent Imaging: N/A  Assessment & Plan:    1. Groin swelling -I suspect that her lump and blood on the pad were the result of a sebaceous cyst that had become occluded and infected and then proceeded to drain on its own  -it does not appear infected at this time and there is no sign of cellulitis or abscess formation -I recommended to use a cleanser formulated for acne in the groin area daily to keep  her pores open -if she should experience this again, I recommend that she see her dermatologist or PCP    2. Urge incontinence/OAB -continue Myrbetriq 50 mg daily  3. rUTI's -continue vaginal estrogen cream and cranberry tablets  4. Chronic UPJ obstruction -serum creatinine stable -continue to monitor   5. Incomplete bladder emptying  -serum creatinine stable  Return for keep appointment with Dr. Apolinar Junes .  These notes generated with voice recognition software. I apologize for typographical errors.  Cloretta Ned  [facility] [address]  [address] [place], [place] [address] [phone]

## 2023-05-19 ENCOUNTER — Ambulatory Visit (INDEPENDENT_AMBULATORY_CARE_PROVIDER_SITE_OTHER): Admitting: Urology

## 2023-05-19 ENCOUNTER — Encounter: Payer: Self-pay | Admitting: Urology

## 2023-05-19 VITALS — BP 110/67 | HR 92 | Ht 62.0 in | Wt 165.0 lb

## 2023-05-19 DIAGNOSIS — N3281 Overactive bladder: Secondary | ICD-10-CM | POA: Diagnosis not present

## 2023-05-19 DIAGNOSIS — R339 Retention of urine, unspecified: Secondary | ICD-10-CM

## 2023-05-19 DIAGNOSIS — N390 Urinary tract infection, site not specified: Secondary | ICD-10-CM | POA: Diagnosis not present

## 2023-05-19 DIAGNOSIS — N135 Crossing vessel and stricture of ureter without hydronephrosis: Secondary | ICD-10-CM | POA: Diagnosis not present

## 2023-05-19 DIAGNOSIS — R1909 Other intra-abdominal and pelvic swelling, mass and lump: Secondary | ICD-10-CM | POA: Diagnosis not present

## 2023-05-19 DIAGNOSIS — N3941 Urge incontinence: Secondary | ICD-10-CM

## 2023-05-20 DIAGNOSIS — I10 Essential (primary) hypertension: Secondary | ICD-10-CM | POA: Diagnosis not present

## 2023-05-27 DIAGNOSIS — I10 Essential (primary) hypertension: Secondary | ICD-10-CM | POA: Diagnosis not present

## 2023-05-27 DIAGNOSIS — J432 Centrilobular emphysema: Secondary | ICD-10-CM | POA: Diagnosis not present

## 2023-05-27 DIAGNOSIS — N1832 Chronic kidney disease, stage 3b: Secondary | ICD-10-CM | POA: Diagnosis not present

## 2023-05-27 DIAGNOSIS — Z0000 Encounter for general adult medical examination without abnormal findings: Secondary | ICD-10-CM | POA: Diagnosis not present

## 2023-05-27 DIAGNOSIS — H353221 Exudative age-related macular degeneration, left eye, with active choroidal neovascularization: Secondary | ICD-10-CM | POA: Diagnosis not present

## 2023-05-27 DIAGNOSIS — G912 (Idiopathic) normal pressure hydrocephalus: Secondary | ICD-10-CM | POA: Diagnosis not present

## 2023-05-27 DIAGNOSIS — I5032 Chronic diastolic (congestive) heart failure: Secondary | ICD-10-CM | POA: Diagnosis not present

## 2023-06-01 DIAGNOSIS — M1711 Unilateral primary osteoarthritis, right knee: Secondary | ICD-10-CM | POA: Diagnosis not present

## 2023-06-08 DIAGNOSIS — J9612 Chronic respiratory failure with hypercapnia: Secondary | ICD-10-CM | POA: Diagnosis not present

## 2023-06-09 DIAGNOSIS — H353132 Nonexudative age-related macular degeneration, bilateral, intermediate dry stage: Secondary | ICD-10-CM | POA: Diagnosis not present

## 2023-06-09 DIAGNOSIS — H353221 Exudative age-related macular degeneration, left eye, with active choroidal neovascularization: Secondary | ICD-10-CM | POA: Diagnosis not present

## 2023-06-09 DIAGNOSIS — H35722 Serous detachment of retinal pigment epithelium, left eye: Secondary | ICD-10-CM | POA: Diagnosis not present

## 2023-06-12 DIAGNOSIS — J449 Chronic obstructive pulmonary disease, unspecified: Secondary | ICD-10-CM | POA: Diagnosis not present

## 2023-06-12 DIAGNOSIS — J9621 Acute and chronic respiratory failure with hypoxia: Secondary | ICD-10-CM | POA: Diagnosis not present

## 2023-07-08 DIAGNOSIS — J9612 Chronic respiratory failure with hypercapnia: Secondary | ICD-10-CM | POA: Diagnosis not present

## 2023-07-12 DIAGNOSIS — J9621 Acute and chronic respiratory failure with hypoxia: Secondary | ICD-10-CM | POA: Diagnosis not present

## 2023-07-12 DIAGNOSIS — J449 Chronic obstructive pulmonary disease, unspecified: Secondary | ICD-10-CM | POA: Diagnosis not present

## 2023-07-16 DIAGNOSIS — R5381 Other malaise: Secondary | ICD-10-CM | POA: Diagnosis not present

## 2023-07-16 DIAGNOSIS — R29898 Other symptoms and signs involving the musculoskeletal system: Secondary | ICD-10-CM | POA: Diagnosis not present

## 2023-07-16 DIAGNOSIS — J449 Chronic obstructive pulmonary disease, unspecified: Secondary | ICD-10-CM | POA: Diagnosis not present

## 2023-07-16 DIAGNOSIS — J9811 Atelectasis: Secondary | ICD-10-CM | POA: Diagnosis not present

## 2023-07-20 DIAGNOSIS — M1711 Unilateral primary osteoarthritis, right knee: Secondary | ICD-10-CM | POA: Diagnosis not present

## 2023-07-28 DIAGNOSIS — M1711 Unilateral primary osteoarthritis, right knee: Secondary | ICD-10-CM | POA: Diagnosis not present

## 2023-08-05 DIAGNOSIS — M1712 Unilateral primary osteoarthritis, left knee: Secondary | ICD-10-CM | POA: Diagnosis not present

## 2023-08-08 DIAGNOSIS — J9612 Chronic respiratory failure with hypercapnia: Secondary | ICD-10-CM | POA: Diagnosis not present

## 2023-08-12 DIAGNOSIS — J9621 Acute and chronic respiratory failure with hypoxia: Secondary | ICD-10-CM | POA: Diagnosis not present

## 2023-08-12 DIAGNOSIS — J449 Chronic obstructive pulmonary disease, unspecified: Secondary | ICD-10-CM | POA: Diagnosis not present

## 2023-08-17 DIAGNOSIS — R4189 Other symptoms and signs involving cognitive functions and awareness: Secondary | ICD-10-CM | POA: Diagnosis not present

## 2023-08-17 DIAGNOSIS — R296 Repeated falls: Secondary | ICD-10-CM | POA: Diagnosis not present

## 2023-08-17 DIAGNOSIS — G912 (Idiopathic) normal pressure hydrocephalus: Secondary | ICD-10-CM | POA: Diagnosis not present

## 2023-08-17 DIAGNOSIS — R2689 Other abnormalities of gait and mobility: Secondary | ICD-10-CM | POA: Diagnosis not present

## 2023-08-22 DIAGNOSIS — Z87891 Personal history of nicotine dependence: Secondary | ICD-10-CM | POA: Diagnosis not present

## 2023-08-22 DIAGNOSIS — Z7952 Long term (current) use of systemic steroids: Secondary | ICD-10-CM | POA: Diagnosis not present

## 2023-08-22 DIAGNOSIS — J9611 Chronic respiratory failure with hypoxia: Secondary | ICD-10-CM | POA: Diagnosis not present

## 2023-08-22 DIAGNOSIS — Z7982 Long term (current) use of aspirin: Secondary | ICD-10-CM | POA: Diagnosis not present

## 2023-08-22 DIAGNOSIS — I10 Essential (primary) hypertension: Secondary | ICD-10-CM | POA: Diagnosis not present

## 2023-08-22 DIAGNOSIS — J449 Chronic obstructive pulmonary disease, unspecified: Secondary | ICD-10-CM | POA: Diagnosis not present

## 2023-08-22 DIAGNOSIS — C44301 Unspecified malignant neoplasm of skin of nose: Secondary | ICD-10-CM | POA: Diagnosis not present

## 2023-08-22 DIAGNOSIS — R609 Edema, unspecified: Secondary | ICD-10-CM | POA: Diagnosis not present

## 2023-08-22 DIAGNOSIS — M1711 Unilateral primary osteoarthritis, right knee: Secondary | ICD-10-CM | POA: Diagnosis not present

## 2023-08-22 DIAGNOSIS — Z9981 Dependence on supplemental oxygen: Secondary | ICD-10-CM | POA: Diagnosis not present

## 2023-08-22 DIAGNOSIS — J9811 Atelectasis: Secondary | ICD-10-CM | POA: Diagnosis not present

## 2023-09-07 DIAGNOSIS — J9612 Chronic respiratory failure with hypercapnia: Secondary | ICD-10-CM | POA: Diagnosis not present

## 2023-09-08 DIAGNOSIS — H35722 Serous detachment of retinal pigment epithelium, left eye: Secondary | ICD-10-CM | POA: Diagnosis not present

## 2023-09-08 DIAGNOSIS — H353221 Exudative age-related macular degeneration, left eye, with active choroidal neovascularization: Secondary | ICD-10-CM | POA: Diagnosis not present

## 2023-09-08 DIAGNOSIS — H353132 Nonexudative age-related macular degeneration, bilateral, intermediate dry stage: Secondary | ICD-10-CM | POA: Diagnosis not present

## 2023-09-11 DIAGNOSIS — J449 Chronic obstructive pulmonary disease, unspecified: Secondary | ICD-10-CM | POA: Diagnosis not present

## 2023-09-11 DIAGNOSIS — J9621 Acute and chronic respiratory failure with hypoxia: Secondary | ICD-10-CM | POA: Diagnosis not present

## 2023-09-14 DIAGNOSIS — D2272 Melanocytic nevi of left lower limb, including hip: Secondary | ICD-10-CM | POA: Diagnosis not present

## 2023-09-14 DIAGNOSIS — L821 Other seborrheic keratosis: Secondary | ICD-10-CM | POA: Diagnosis not present

## 2023-09-14 DIAGNOSIS — L570 Actinic keratosis: Secondary | ICD-10-CM | POA: Diagnosis not present

## 2023-09-14 DIAGNOSIS — D2262 Melanocytic nevi of left upper limb, including shoulder: Secondary | ICD-10-CM | POA: Diagnosis not present

## 2023-09-14 DIAGNOSIS — D2261 Melanocytic nevi of right upper limb, including shoulder: Secondary | ICD-10-CM | POA: Diagnosis not present

## 2023-09-14 DIAGNOSIS — D225 Melanocytic nevi of trunk: Secondary | ICD-10-CM | POA: Diagnosis not present

## 2023-09-14 DIAGNOSIS — Z08 Encounter for follow-up examination after completed treatment for malignant neoplasm: Secondary | ICD-10-CM | POA: Diagnosis not present

## 2023-09-14 DIAGNOSIS — Z85828 Personal history of other malignant neoplasm of skin: Secondary | ICD-10-CM | POA: Diagnosis not present

## 2023-09-16 ENCOUNTER — Ambulatory Visit: Payer: Self-pay | Admitting: Urology

## 2023-09-21 ENCOUNTER — Other Ambulatory Visit: Payer: Self-pay | Admitting: Urology

## 2023-09-21 DIAGNOSIS — N390 Urinary tract infection, site not specified: Secondary | ICD-10-CM

## 2023-09-21 DIAGNOSIS — N3941 Urge incontinence: Secondary | ICD-10-CM

## 2023-10-06 NOTE — Progress Notes (Unsigned)
 10/07/2023 4:45 PM   [name] 1950/09/09 [id number]  Referring provider: Rudolpho Norleen BIRCH, MD [address] [facility] [address] [facility] [place],  [place] [address]  Urological history: 1. Chronic UPJ obstruction -RUS (2020) - severe left hydronephrosis similar to 2017 US  and CT -serum creatinine (11/2022) - 1.3  -managed conservatively   2. Urge incontinence -contributing factors of age, GSM, HTN, COPD, normal pressure hydrocephalus, stroke, CKD, sleep apnea, diuretic and history of smoking -Myrbetriq  50 mg daily  3. OAB -contributing factors of age, GSM, HTN, COPD, normal pressure hydrocephalus, stroke, CKD, sleep apnea, diuretic and history of smoking -Myrbetriq  50 mg daily  4. rUTI's -contributing factors of age, GSM and incontinence -vaginal estrogen cream and cranberry tablets   5. Incomplete bladder emptying -contributing factors of age, stroke, GSM and normal pressure hydrocephalus  Chief Complaint  Patient presents with   Medical Management of Chronic Issues   HPI: Abigail Hill is a 73 y.o. female who presents today for yearly follow-up.  Previous records reviewed.   She is doing well.  She is taking the Myrbetriq  and applying estrogen cream.  She has not had any infection for over 2 years.  She is at goal in regards to her OAB and urge incontinence.  She does not want to change anything at this time.  Patient denies any modifying or aggravating factors.  Patient denies any recent UTI's, gross hematuria, dysuria or suprapubic/flank pain.  Patient denies any fevers, chills, nausea or vomiting.    Serum creatinine (05/2023) 1.1, eGFR 52  PVR 0 mL  PMH: Past Medical History:  Diagnosis Date   Abnormal gait 12/18/2015   BP (high blood pressure) 08/24/2015   Cancer (HCC)    skin   Constriction of ureter (postoperative) 07/28/2015   Hydronephrosis 07/14/2015   Hypertension    Left foot drop 12/18/2015   Recurrent UTI    UPJ (ureteropelvic junction) obstruction  07/28/2015   UTI (lower urinary tract infection) 07/14/2015    Surgical History: Past Surgical History:  Procedure Laterality Date   broken foot  2012   COLONOSCOPY WITH PROPOFOL  N/A 06/30/2017   Procedure: COLONOSCOPY WITH PROPOFOL ;  Surgeon: Gaylyn Gladis PENNER, MD;  Location: [facility] ENDOSCOPY;  Service: Endoscopy;  Laterality: N/A;   kidney repair  1985   repaired torn blood vessel in kidney    Home Medications:  Allergies as of 10/07/2023       Reactions   Oxycodone Nausea And Vomiting        Medication List        Accurate as of October 07, 2023  4:45 PM. If you have any questions, ask your nurse or doctor.          alendronate  70 MG tablet Commonly known as: FOSAMAX  Take 70 mg by mouth every Monday.   aspirin  81 MG chewable tablet Chew 81 mg by mouth daily.   Cranberry 300 MG tablet Take 300 mg by mouth daily.   estradiol  0.1 MG/GM vaginal cream Commonly known as: ESTRACE  Place 1 Applicatorful vaginally at bedtime. Estrogen Cream Instruction Discard applicator Apply pea sized amount to tip of finger to urethra before bed. Wash hands well after application. Use everyday once a day for the first two weeks. Then 3 times a week.   mirabegron  ER 50 MG Tb24 tablet Commonly known as: Myrbetriq  Take 1 tablet (50 mg total) by mouth daily.   Oyster Shell Calcium  500-400 MG-UNIT Tabs Take 1 tablet by mouth daily.   predniSONE   2.5 MG tablet Commonly known as: DELTASONE  Take 2.5 mg by mouth daily.   spironolactone  25 MG tablet Commonly known as: ALDACTONE  Take 25 mg by mouth daily.   torsemide  20 MG tablet Commonly known as: DEMADEX  Take 10 mg by mouth daily.        Allergies:  Allergies  Allergen Reactions   Oxycodone Nausea And Vomiting    Family History: Family History  Problem Relation Age of Onset   Kidney disease Neg Hx    Bladder Cancer Neg Hx     Social History:  reports that she has quit smoking. She has never used smokeless tobacco. She  reports that she does not drink alcohol  and does not use drugs.  ROS: Pertinent ROS in HPI  Physical Exam: BP 111/70   Pulse 88   Ht 5' 2 (1.575 m)   Wt 160 lb (72.6 kg)   BMI 29.26 kg/m   Constitutional:  Well nourished. Alert and oriented, No acute distress. HEENT: [place] AT, moist mucus membranes.  Trachea midline Cardiovascular: No clubbing, cyanosis, or edema. Respiratory: Normal respiratory effort, no increased work of breathing. Neurologic: Grossly intact, no focal deficits, moving all 4 extremities. Psychiatric: Normal mood and affect.    Laboratory Data: See EPIC and HPI I have reviewed the labs.   Pertinent Imaging: N/A  Assessment & Plan:    1. Urge incontinence/OAB -continue Myrbetriq  50 mg daily  2. rUTI's -continue vaginal estrogen cream and cranberry tablets  3. Chronic UPJ obstruction -serum creatinine stable -continue to monitor   4. Incomplete bladder emptying  -serum creatinine stable -PVR 0 mL  Return in about 1 year (around 10/06/2024) for PVR and symptom recheck.  These notes generated with voice recognition software. I apologize for typographical errors.  Abigail Hill  [facility] [address]  [address] [place], [place] [address] [phone]

## 2023-10-07 ENCOUNTER — Encounter: Payer: Self-pay | Admitting: Urology

## 2023-10-07 ENCOUNTER — Ambulatory Visit: Admitting: Urology

## 2023-10-07 VITALS — BP 111/70 | HR 88 | Ht 62.0 in | Wt 160.0 lb

## 2023-10-07 DIAGNOSIS — N390 Urinary tract infection, site not specified: Secondary | ICD-10-CM

## 2023-10-07 DIAGNOSIS — N135 Crossing vessel and stricture of ureter without hydronephrosis: Secondary | ICD-10-CM | POA: Diagnosis not present

## 2023-10-07 DIAGNOSIS — R339 Retention of urine, unspecified: Secondary | ICD-10-CM | POA: Diagnosis not present

## 2023-10-07 DIAGNOSIS — N3941 Urge incontinence: Secondary | ICD-10-CM | POA: Diagnosis not present

## 2023-10-07 LAB — BLADDER SCAN AMB NON-IMAGING: Scan Result: 0

## 2023-10-07 MED ORDER — ESTRADIOL 0.1 MG/GM VA CREA: 1.0000 | TOPICAL_CREAM | Freq: Every day | VAGINAL | 12 refills | Status: AC

## 2023-10-07 MED ORDER — MIRABEGRON ER 50 MG PO TB24: 50.0000 mg | ORAL_TABLET | Freq: Every day | ORAL | 3 refills | Status: AC

## 2023-10-08 DIAGNOSIS — J9612 Chronic respiratory failure with hypercapnia: Secondary | ICD-10-CM | POA: Diagnosis not present

## 2023-10-12 DIAGNOSIS — J449 Chronic obstructive pulmonary disease, unspecified: Secondary | ICD-10-CM | POA: Diagnosis not present

## 2023-10-12 DIAGNOSIS — J9621 Acute and chronic respiratory failure with hypoxia: Secondary | ICD-10-CM | POA: Diagnosis not present

## 2023-10-21 DIAGNOSIS — I10 Essential (primary) hypertension: Secondary | ICD-10-CM | POA: Diagnosis not present

## 2023-10-21 DIAGNOSIS — M1711 Unilateral primary osteoarthritis, right knee: Secondary | ICD-10-CM | POA: Diagnosis not present

## 2023-10-21 DIAGNOSIS — J449 Chronic obstructive pulmonary disease, unspecified: Secondary | ICD-10-CM | POA: Diagnosis not present

## 2023-10-21 DIAGNOSIS — C44301 Unspecified malignant neoplasm of skin of nose: Secondary | ICD-10-CM | POA: Diagnosis not present

## 2023-10-21 DIAGNOSIS — J9611 Chronic respiratory failure with hypoxia: Secondary | ICD-10-CM | POA: Diagnosis not present

## 2023-10-21 DIAGNOSIS — J9811 Atelectasis: Secondary | ICD-10-CM | POA: Diagnosis not present

## 2023-10-21 DIAGNOSIS — Z7982 Long term (current) use of aspirin: Secondary | ICD-10-CM | POA: Diagnosis not present

## 2023-11-08 DIAGNOSIS — J9612 Chronic respiratory failure with hypercapnia: Secondary | ICD-10-CM | POA: Diagnosis not present

## 2023-11-12 DIAGNOSIS — J449 Chronic obstructive pulmonary disease, unspecified: Secondary | ICD-10-CM | POA: Diagnosis not present

## 2023-11-12 DIAGNOSIS — J9621 Acute and chronic respiratory failure with hypoxia: Secondary | ICD-10-CM | POA: Diagnosis not present

## 2023-11-25 DIAGNOSIS — I10 Essential (primary) hypertension: Secondary | ICD-10-CM | POA: Diagnosis not present

## 2023-12-02 DIAGNOSIS — Z1231 Encounter for screening mammogram for malignant neoplasm of breast: Secondary | ICD-10-CM | POA: Diagnosis not present

## 2023-12-02 DIAGNOSIS — I10 Essential (primary) hypertension: Secondary | ICD-10-CM | POA: Diagnosis not present

## 2023-12-02 DIAGNOSIS — J432 Centrilobular emphysema: Secondary | ICD-10-CM | POA: Diagnosis not present

## 2023-12-02 DIAGNOSIS — Z1331 Encounter for screening for depression: Secondary | ICD-10-CM | POA: Diagnosis not present

## 2023-12-02 DIAGNOSIS — N1832 Chronic kidney disease, stage 3b: Secondary | ICD-10-CM | POA: Diagnosis not present

## 2023-12-02 DIAGNOSIS — G912 (Idiopathic) normal pressure hydrocephalus: Secondary | ICD-10-CM | POA: Diagnosis not present

## 2023-12-02 DIAGNOSIS — I5032 Chronic diastolic (congestive) heart failure: Secondary | ICD-10-CM | POA: Diagnosis not present

## 2023-12-02 DIAGNOSIS — Z0001 Encounter for general adult medical examination with abnormal findings: Secondary | ICD-10-CM | POA: Diagnosis not present

## 2023-12-08 DIAGNOSIS — H353221 Exudative age-related macular degeneration, left eye, with active choroidal neovascularization: Secondary | ICD-10-CM | POA: Diagnosis not present

## 2023-12-08 DIAGNOSIS — H35722 Serous detachment of retinal pigment epithelium, left eye: Secondary | ICD-10-CM | POA: Diagnosis not present

## 2023-12-08 DIAGNOSIS — J9612 Chronic respiratory failure with hypercapnia: Secondary | ICD-10-CM | POA: Diagnosis not present

## 2023-12-08 DIAGNOSIS — H353132 Nonexudative age-related macular degeneration, bilateral, intermediate dry stage: Secondary | ICD-10-CM | POA: Diagnosis not present

## 2023-12-12 DIAGNOSIS — J9621 Acute and chronic respiratory failure with hypoxia: Secondary | ICD-10-CM | POA: Diagnosis not present

## 2023-12-12 DIAGNOSIS — J449 Chronic obstructive pulmonary disease, unspecified: Secondary | ICD-10-CM | POA: Diagnosis not present

## 2024-01-08 DIAGNOSIS — J9612 Chronic respiratory failure with hypercapnia: Secondary | ICD-10-CM | POA: Diagnosis not present

## 2024-01-12 DIAGNOSIS — J9621 Acute and chronic respiratory failure with hypoxia: Secondary | ICD-10-CM | POA: Diagnosis not present

## 2024-01-14 DIAGNOSIS — J449 Chronic obstructive pulmonary disease, unspecified: Secondary | ICD-10-CM | POA: Diagnosis not present

## 2024-01-18 DIAGNOSIS — R4189 Other symptoms and signs involving cognitive functions and awareness: Secondary | ICD-10-CM | POA: Diagnosis not present

## 2024-01-18 DIAGNOSIS — G912 (Idiopathic) normal pressure hydrocephalus: Secondary | ICD-10-CM | POA: Diagnosis not present

## 2024-01-18 DIAGNOSIS — R2689 Other abnormalities of gait and mobility: Secondary | ICD-10-CM | POA: Diagnosis not present

## 2024-01-18 DIAGNOSIS — R296 Repeated falls: Secondary | ICD-10-CM | POA: Diagnosis not present

## 2024-01-18 DIAGNOSIS — M47812 Spondylosis without myelopathy or radiculopathy, cervical region: Secondary | ICD-10-CM | POA: Diagnosis not present

## 2024-01-19 ENCOUNTER — Other Ambulatory Visit: Payer: Self-pay | Admitting: Internal Medicine

## 2024-01-19 DIAGNOSIS — Z1231 Encounter for screening mammogram for malignant neoplasm of breast: Secondary | ICD-10-CM

## 2024-02-09 DIAGNOSIS — H353132 Nonexudative age-related macular degeneration, bilateral, intermediate dry stage: Secondary | ICD-10-CM | POA: Diagnosis not present

## 2024-02-09 DIAGNOSIS — G4733 Obstructive sleep apnea (adult) (pediatric): Secondary | ICD-10-CM | POA: Diagnosis not present

## 2024-02-09 DIAGNOSIS — H35722 Serous detachment of retinal pigment epithelium, left eye: Secondary | ICD-10-CM | POA: Diagnosis not present

## 2024-02-09 DIAGNOSIS — H353221 Exudative age-related macular degeneration, left eye, with active choroidal neovascularization: Secondary | ICD-10-CM | POA: Diagnosis not present

## 2024-02-11 DIAGNOSIS — J9621 Acute and chronic respiratory failure with hypoxia: Secondary | ICD-10-CM | POA: Diagnosis not present

## 2024-03-04 ENCOUNTER — Ambulatory Visit
Admission: RE | Admit: 2024-03-04 | Discharge: 2024-03-04 | Disposition: A | Discharge status: Home / Self Care | Source: Ambulatory Visit | Attending: Internal Medicine | Admitting: Internal Medicine

## 2024-03-04 DIAGNOSIS — Z1231 Encounter for screening mammogram for malignant neoplasm of breast: Secondary | ICD-10-CM | POA: Insufficient documentation

## 2024-03-22 ENCOUNTER — Other Ambulatory Visit: Payer: Self-pay | Admitting: Urology

## 2024-10-06 ENCOUNTER — Ambulatory Visit: Admitting: Urology
